# Patient Record
Sex: Female | Born: 1970
Health system: Southern US, Community
[De-identification: ages and names within clinical notes are randomized; demographics above are authoritative.]

## PROBLEM LIST (undated history)

## (undated) DIAGNOSIS — R55 Syncope and collapse: Secondary | ICD-10-CM

## (undated) DIAGNOSIS — F410 Panic disorder [episodic paroxysmal anxiety] without agoraphobia: Secondary | ICD-10-CM

## (undated) DIAGNOSIS — K219 Gastro-esophageal reflux disease without esophagitis: Secondary | ICD-10-CM

## (undated) DIAGNOSIS — F32A Depression, unspecified: Secondary | ICD-10-CM

## (undated) DIAGNOSIS — E739 Lactose intolerance, unspecified: Secondary | ICD-10-CM

## (undated) DIAGNOSIS — A048 Other specified bacterial intestinal infections: Secondary | ICD-10-CM

## (undated) DIAGNOSIS — I10 Essential (primary) hypertension: Secondary | ICD-10-CM

## (undated) DIAGNOSIS — M51369 Other intervertebral disc degeneration, lumbar region without mention of lumbar back pain or lower extremity pain: Secondary | ICD-10-CM

## (undated) DIAGNOSIS — E559 Vitamin D deficiency, unspecified: Secondary | ICD-10-CM

## (undated) DIAGNOSIS — R569 Unspecified convulsions: Secondary | ICD-10-CM

## (undated) DIAGNOSIS — Z8719 Personal history of other diseases of the digestive system: Secondary | ICD-10-CM

## (undated) DIAGNOSIS — M1990 Unspecified osteoarthritis, unspecified site: Secondary | ICD-10-CM

## (undated) DIAGNOSIS — M5136 Other intervertebral disc degeneration, lumbar region: Secondary | ICD-10-CM

## (undated) DIAGNOSIS — M2550 Pain in unspecified joint: Secondary | ICD-10-CM

## (undated) DIAGNOSIS — K760 Fatty (change of) liver, not elsewhere classified: Secondary | ICD-10-CM

## (undated) DIAGNOSIS — R079 Chest pain, unspecified: Secondary | ICD-10-CM

## (undated) DIAGNOSIS — R42 Dizziness and giddiness: Secondary | ICD-10-CM

## (undated) DIAGNOSIS — F4310 Post-traumatic stress disorder, unspecified: Secondary | ICD-10-CM

## (undated) DIAGNOSIS — M5430 Sciatica, unspecified side: Secondary | ICD-10-CM

## (undated) DIAGNOSIS — F419 Anxiety disorder, unspecified: Secondary | ICD-10-CM

## (undated) DIAGNOSIS — R202 Paresthesia of skin: Secondary | ICD-10-CM

## (undated) DIAGNOSIS — E538 Deficiency of other specified B group vitamins: Secondary | ICD-10-CM

## (undated) DIAGNOSIS — E669 Obesity, unspecified: Secondary | ICD-10-CM

## (undated) DIAGNOSIS — M5412 Radiculopathy, cervical region: Secondary | ICD-10-CM

## (undated) DIAGNOSIS — R002 Palpitations: Secondary | ICD-10-CM

## (undated) DIAGNOSIS — K5909 Other constipation: Secondary | ICD-10-CM

## (undated) DIAGNOSIS — E7800 Pure hypercholesterolemia, unspecified: Secondary | ICD-10-CM

## (undated) DIAGNOSIS — Z862 Personal history of diseases of the blood and blood-forming organs and certain disorders involving the immune mechanism: Secondary | ICD-10-CM

## (undated) DIAGNOSIS — G43909 Migraine, unspecified, not intractable, without status migrainosus: Secondary | ICD-10-CM

## (undated) DIAGNOSIS — D509 Iron deficiency anemia, unspecified: Secondary | ICD-10-CM

## (undated) DIAGNOSIS — F329 Major depressive disorder, single episode, unspecified: Secondary | ICD-10-CM

## (undated) DIAGNOSIS — R200 Anesthesia of skin: Secondary | ICD-10-CM

## (undated) DIAGNOSIS — G4700 Insomnia, unspecified: Secondary | ICD-10-CM

## (undated) HISTORY — PX: WISDOM TOOTH EXTRACTION: SHX21

## (undated) HISTORY — DX: Deficiency of other specified B group vitamins: E53.8

## (undated) HISTORY — DX: Fatty (change of) liver, not elsewhere classified: K76.0

## (undated) HISTORY — DX: Depression, unspecified: F32.A

## (undated) HISTORY — DX: Lactose intolerance, unspecified: E73.9

## (undated) HISTORY — DX: Chest pain, unspecified: R07.9

## (undated) HISTORY — DX: Anxiety disorder, unspecified: F41.9

## (undated) HISTORY — DX: Unspecified osteoarthritis, unspecified site: M19.90

## (undated) HISTORY — DX: Gastro-esophageal reflux disease without esophagitis: K21.9

## (undated) HISTORY — DX: Pain in unspecified joint: M25.50

## (undated) HISTORY — PX: TUBAL LIGATION: SHX77

## (undated) HISTORY — DX: Personal history of diseases of the blood and blood-forming organs and certain disorders involving the immune mechanism: Z86.2

## (undated) HISTORY — DX: Pure hypercholesterolemia, unspecified: E78.00

## (undated) HISTORY — DX: Major depressive disorder, single episode, unspecified: F32.9

## (undated) HISTORY — PX: EYE SURGERY: SHX253

## (undated) HISTORY — DX: Post-traumatic stress disorder, unspecified: F43.10

## (undated) HISTORY — PX: BUNIONECTOMY: SHX129

## (undated) HISTORY — DX: Other specified bacterial intestinal infections: A04.8

## (undated) HISTORY — DX: Vitamin D deficiency, unspecified: E55.9

---

## 1999-09-27 ENCOUNTER — Emergency Department (HOSPITAL_COMMUNITY): Admission: EM | Admit: 1999-09-27 | Discharge: 1999-09-27 | Payer: Self-pay | Admitting: Emergency Medicine

## 1999-09-28 ENCOUNTER — Encounter: Payer: Self-pay | Admitting: Emergency Medicine

## 2000-07-24 ENCOUNTER — Other Ambulatory Visit: Admission: RE | Admit: 2000-07-24 | Discharge: 2000-07-24 | Payer: Self-pay | Admitting: Obstetrics and Gynecology

## 2005-12-10 ENCOUNTER — Encounter: Admission: RE | Admit: 2005-12-10 | Discharge: 2005-12-10 | Payer: Self-pay | Admitting: Obstetrics & Gynecology

## 2006-01-21 ENCOUNTER — Ambulatory Visit: Payer: Self-pay | Admitting: Orthopedic Surgery

## 2006-02-05 ENCOUNTER — Encounter (HOSPITAL_COMMUNITY): Admission: RE | Admit: 2006-02-05 | Discharge: 2006-03-07 | Payer: Self-pay | Admitting: Orthopedic Surgery

## 2007-05-20 ENCOUNTER — Ambulatory Visit: Payer: Self-pay | Admitting: Orthopedic Surgery

## 2007-11-25 ENCOUNTER — Encounter: Payer: Self-pay | Admitting: Orthopedic Surgery

## 2007-11-25 ENCOUNTER — Emergency Department (HOSPITAL_COMMUNITY): Admission: EM | Admit: 2007-11-25 | Discharge: 2007-11-25 | Payer: Self-pay | Admitting: Emergency Medicine

## 2007-12-31 ENCOUNTER — Encounter (HOSPITAL_COMMUNITY): Admission: RE | Admit: 2007-12-31 | Discharge: 2008-01-01 | Payer: Self-pay | Admitting: Internal Medicine

## 2008-01-06 ENCOUNTER — Encounter (HOSPITAL_COMMUNITY): Admission: RE | Admit: 2008-01-06 | Discharge: 2008-02-05 | Payer: Self-pay | Admitting: Internal Medicine

## 2009-03-04 ENCOUNTER — Ambulatory Visit: Payer: Self-pay | Admitting: Physician Assistant

## 2009-03-04 DIAGNOSIS — F17200 Nicotine dependence, unspecified, uncomplicated: Secondary | ICD-10-CM | POA: Insufficient documentation

## 2009-03-04 DIAGNOSIS — K219 Gastro-esophageal reflux disease without esophagitis: Secondary | ICD-10-CM | POA: Insufficient documentation

## 2009-03-04 DIAGNOSIS — K5900 Constipation, unspecified: Secondary | ICD-10-CM | POA: Insufficient documentation

## 2009-03-04 DIAGNOSIS — E669 Obesity, unspecified: Secondary | ICD-10-CM | POA: Insufficient documentation

## 2009-03-04 DIAGNOSIS — K5909 Other constipation: Secondary | ICD-10-CM

## 2009-03-09 ENCOUNTER — Ambulatory Visit (HOSPITAL_COMMUNITY): Admission: RE | Admit: 2009-03-09 | Discharge: 2009-03-09 | Payer: Self-pay | Admitting: Gastroenterology

## 2009-03-09 HISTORY — PX: ESOPHAGOGASTRODUODENOSCOPY: SHX1529

## 2009-05-05 ENCOUNTER — Ambulatory Visit: Payer: Self-pay | Admitting: Family Medicine

## 2009-05-05 DIAGNOSIS — D509 Iron deficiency anemia, unspecified: Secondary | ICD-10-CM | POA: Insufficient documentation

## 2009-05-18 ENCOUNTER — Encounter: Payer: Self-pay | Admitting: Physician Assistant

## 2009-06-23 ENCOUNTER — Telehealth: Payer: Self-pay | Admitting: Physician Assistant

## 2009-07-20 ENCOUNTER — Encounter: Payer: Self-pay | Admitting: Physician Assistant

## 2009-10-03 ENCOUNTER — Ambulatory Visit: Payer: Self-pay | Admitting: Orthopedic Surgery

## 2009-10-03 ENCOUNTER — Encounter (INDEPENDENT_AMBULATORY_CARE_PROVIDER_SITE_OTHER): Payer: Self-pay | Admitting: *Deleted

## 2009-10-04 ENCOUNTER — Encounter (INDEPENDENT_AMBULATORY_CARE_PROVIDER_SITE_OTHER): Payer: Self-pay | Admitting: *Deleted

## 2009-10-18 ENCOUNTER — Encounter (INDEPENDENT_AMBULATORY_CARE_PROVIDER_SITE_OTHER): Payer: Self-pay | Admitting: *Deleted

## 2009-10-26 ENCOUNTER — Telehealth: Payer: Self-pay | Admitting: Orthopedic Surgery

## 2009-10-28 ENCOUNTER — Encounter: Payer: Self-pay | Admitting: Orthopedic Surgery

## 2010-01-21 ENCOUNTER — Encounter: Payer: Self-pay | Admitting: Orthopedic Surgery

## 2010-01-31 NOTE — Miscellaneous (Signed)
Summary: pt rescheduled her mri to 10/20/09,resc appt also  Clinical Lists Changes

## 2010-01-31 NOTE — Letter (Signed)
Summary: Out of Work  [facility]  [address]  [place], [place] [address]   Phone: [phone]  Fax: [phone]    October 03, 2009   Employee:  KAIRA STRINGFIELD    To Whom It May Concern:   For Medical reasons, please excuse the above named employee from work for the following dates:  Start:   10/03/09  End/Return to work:   10/03/09 following appointment in our office this morning.   If you need additional information, please feel free to contact our office.         Sincerely,   Terrance Mass, MD

## 2010-01-31 NOTE — Progress Notes (Signed)
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  Phentermine prescription can be discussed at the OV. Follow-up by: Esperanza Sheets PA,  June 23, 2009 3:05 PM  Additional Follow-up for Phone Call Additional follow up Details #1::        returned call, left message Additional Follow-up by: Adella Hare LPN,  June 24, 2009 2:18 PM    Additional Follow-up for Phone Call Additional follow up Details #2::    patient aware Follow-up by: Adella Hare LPN,  June 24, 2009 2:37 PM
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
## 2010-01-31 NOTE — Letter (Signed)
Summary: Letter  Letter   Imported By: Lind Guest 05/18/2009 14:24:47  _____________________________________________________________________  External Attachment:    Type:   Image     Comment:   External Document

## 2010-01-31 NOTE — Letter (Signed)
Summary: *Orthopedic No Show Letter  [facility]  [address]  [place], [place] [address]   Phone: [phone]  Fax: [phone]      10/28/2009   Ms. Deah Ottaway [address] [place], [place] [address]   Dear Ms. Speakman,   Our records indicate that you missed your scheduled appointment with Dr. Beaulah Corin on October 26, 2009, and the related MRI appointment scheduled at [facility] October 20, 2009.   Please contact this office to reschedule your appointments.  It is important that you keep your scheduled appointments with your physician, so we can provide you the best care possible.        Sincerely,    Dr. Terrance Mass, MD [facility] Phone [phone]

## 2010-01-31 NOTE — Medication Information (Signed)
Summary: RX Folder  RX Folder   Imported By: Cammie Sickle 11/04/2009 18:41:30  _____________________________________________________________________  External Attachment:    Type:   Image     Comment:   External Document

## 2010-01-31 NOTE — Miscellaneous (Signed)
Summary: mri open of the c spine for 10/13/09 reg 7:00pm come in for fu  Clinical Lists Changes  precert for [employer] [id number] expires 11/02/09, called pt lmom to call to schedule fu appt for results, her mri is at [facility] imaging [place] location, 10/04/09 left mess.  I left a fol/up voice msg on cell ph #[phone] 10/07/09 for patient to return call to confirm MRI appt & to schedule fol up appointment here.   pt called back 10/07/09 I advised to bring her films and report with her fo appt on 10/20/09

## 2010-01-31 NOTE — Progress Notes (Signed)
Summary: call to patient re:  appointments  ---- Converted from flag ---- ---- 10/25/2009 10:18 AM, Cammie Sickle wrote: I called patient's cell # [phone], left a voice mail msg to please return call today, re: office follow up appointment and MRI appointment, to re-schedule.  ---- 10/25/2009 9:07 AM, Forde Dandy L Kandis Ban wrote: i tried to call this pt, she is to come in for mri results on wed, i dont think she had mri, she has resceduled mri one time before, the line has been busy today at her residence ------------------------------

## 2010-01-31 NOTE — Letter (Signed)
Summary: Letter  Letter   Imported By: Lind Guest 07/20/2009 14:40:36  _____________________________________________________________________  External Attachment:    Type:   Image     Comment:   External Document

## 2010-01-31 NOTE — Assessment & Plan Note (Signed)
Summary: weight follow up- room 2   Vital Signs:  Patient profile:   40 year old female Height:      62.5 inches Weight:      208.50 pounds BMI:     37.66 O2 Sat:      100 % on Room air Pulse rate:   90 / minute Resp:     16 per minute BP sitting:   110 / 70  (left arm)  Vitals Entered By: Adella Hare LPN (May 06, 4399 4:09 PM) CC: weight follow up Is Patient Diabetic? No Pain Assessment Patient in pain? no        CC:  weight follow up.  History of Present Illness: Has been going to the [facility] x about 4-6 weeks.  Goes 2 days per week.  Before starting at the [facility] was walking 2 days a week. She has been taking Phentermine daily.  Has lost < 1 # since her visit 2 mos ago. States she has been eating healthier.  Has cut out sodas, & sweets.  Had recent EGD ( end of March ).  Significant GERD per pt.  Using Omeprazole 20mg  once daily (pt previously told us it was Prevacid, but states today it is actually Omeprazole).  Still having heart burn & needing to use over the counter prods with it.  BM's normal.  Pt states she had her blood drawn after her last visit.  There are no results in EMR and lab states nothing in their system.  Pt states she has received a bill from the lab for this though.  Pt also states that she has been told that she is anemia in the past.  She takes 1 iron pill daily. Also hx of chronic constipation.  She uses Miralax for this & works well.     Current Medications (verified): 1)  Prevacid 24hr 15 Mg Cpdr (Lansoprazole) .... One Cap By Mouth Once Daily 2)  Phentermine Hcl 37.5 Mg Caps (Phentermine Hcl) .Marland Kitchen.. 1 Q Am  Allergies (verified): No Known Drug Allergies  Past History:  Past medical history reviewed for relevance to current acute and chronic problems.  Past Medical History: Reviewed history from 03/04/2009 and no changes required. acid reflux constipation  Review of Systems General:  Denies chills and fever. CV:  Denies chest pain or  discomfort and palpitations. Resp:  Denies cough and shortness of breath. GI:  Complains of loss of appetite; denies change in bowel habits, constipation, diarrhea, nausea, and vomiting.  Physical Exam  General:  Well-developed,well-nourished,in no acute distress; alert,appropriate and cooperative throughout examination Head:  Normocephalic and atraumatic without obvious abnormalities. No apparent alopecia or balding. Ears:  External ear exam shows no significant lesions or deformities.  Otoscopic examination reveals clear canals, tympanic membranes are intact bilaterally without bulging, retraction, inflammation or discharge. Hearing is grossly normal bilaterally. Nose:  External nasal examination shows no deformity or inflammation. Nasal mucosa are pink and moist without lesions or exudates. Mouth:  Oral mucosa and oropharynx without lesions or exudates.   Neck:  No deformities, masses, or tenderness noted. Lungs:  Normal respiratory effort, chest expands symmetrically. Lungs are clear to auscultation, no crackles or wheezes. Heart:  Normal rate and regular rhythm. S1 and S2 normal without gallop, murmur, click, rub or other extra sounds. Abdomen:  soft, non-tender, normal bowel sounds, no masses, no hepatomegaly, and no splenomegaly.     Impression & Recommendations:  Problem # 1:  GERD (ICD-530.81) Assessment Unchanged  The following medications  were removed from the medication list:    Prevacid 24hr 15 Mg Cpdr (Lansoprazole) ..... One cap by mouth once daily Her updated medication list for this problem includes:    Omeprazole 40 Mg Cpdr (Omeprazole) .Marland Kitchen... Take 1 daily  Orders: T-Comprehensive Metabolic Panel ([id number])  Problem # 2:  CONSTIPATION, CHRONIC (ICD-564.09) Assessment: Improved  The following medications were removed from the medication list:    Miralax Powd (Polyethylene glycol 3350) .Marland KitchenMarland KitchenMarland KitchenMarland Kitchen 17 gm two times a day Her updated medication list for this problem  includes:    Miralax Powd (Polyethylene glycol 3350) ..... Once daily  Problem # 3:  ANEMIA, IRON DEFICIENCY (ICD-280.9) Assessment: Comment Only  Her updated medication list for this problem includes:    Ferrous Sulfate 325 (65 Fe) Mg Tabs (Ferrous sulfate) .Marland Kitchen... Take 1 daily  Orders: T-CBC No Diff ([id number]) T-Iron ([id number])  Problem # 4:  OBESITY (ICD-278.00) Assessment: Unchanged Discussed with pt that because she isn't losing wt taking Phentermine there is no reason to continue the prescription.  Did offer to give her 1 more mos to try it, but pt declined. Discussed healthy diet.  Discussed 1500-1600 calorie diet pain, and portion sizes.  Handouts given.   Recommended pt to increase her exercise to a minimum of3 days per week, with the goal to be 5 days per wk.  Discussed if she is unable to go to the [facility] this often to walk for exercise on the days she can't go.  Orders: T-Comprehensive Metabolic Panel [phone]) T-Lipid Profile [id number]) T-TSH [id number]) T- Hemoglobin A1C ([id number])  Complete Medication List: 1)  Omeprazole 40 Mg Cpdr (Omeprazole) .... Take 1 daily 2)  Miralax Powd (Polyethylene glycol 3350) .... Once daily 3)  Ferrous Sulfate 325 (65 Fe) Mg Tabs (Ferrous sulfate) .... Take 1 daily  Other Orders: T-Vitamin D (25-Hydroxy) ([id number])  Patient Instructions: 1)  Please schedule a follow-up appointment in 1 month. 2)  Increase your exercise to a minimum of 3 days per week, with the goal to be 5 days a week. 3)  I have given you diet information to follow. Prescriptions: OMEPRAZOLE 40 MG CPDR (OMEPRAZOLE) take 1 daily  #30 x 5   Entered and Authorized by:   Esperanza Sheets PA   Signed by:   Esperanza Sheets PA on 05/05/2009   Method used:   Electronically to        [facility] [phone]* (retail)       [address] [place]  [address]       Ph: [phone]       Fax: [phone]   RxID:   [id number]

## 2010-01-31 NOTE — Assessment & Plan Note (Signed)
Summary: NEW PATIENT - room 3   Vital Signs:  Patient profile:   40 year old female Height:      62.5 inches Weight:      209.25 pounds BMI:     37.80 O2 Sat:      98 % on Room air Pulse rate:   79 / minute Resp:     16 per minute BP sitting:   122 / 82  (left arm)  Vitals Entered By: Adella Hare LPN (March 05, 979 10:23 AM)  Nutrition Counseling: Patient's BMI is greater than 25 and therefore counseled on weight management options. CC: new patient Is Patient Diabetic? No Pain Assessment Patient in pain? no        CC:  new patient.  History of Present Illness: New pt.  Est care with new PCP.  Pt is concerned about her wt.  Wt gain approx 15 # in last 2-3 mos.  Pt states gradually started in last 3yr, but really noticed most of wt gain in last 2-3 mos.  Had used Phenteramine in past for Wt loss.  Lost 45# with it approx 5-6 yrs ago.  She walks for exercise, 1-2 d/wk.  Feels she has been eating fairly healthy.  Does "have a sweet tooth."  Has been more tired too.  Seeing gastroenterologist.  Has Gerd & constipation.  Having EGD next week. See's GYN - pap, etc utd per pt.  Also has been having some discomfort across her posterior neck & shoulder area off & on.  No injury.  Is not taking any meds. Has seen ortho in past for low back pain.  No parasthesia's UE.  No radiation down UE.     Current Medications (verified): 1)  Prevacid 24hr 15 Mg Cpdr (Lansoprazole) .... One Cap By Mouth Once Daily 2)  Miralax  Powd (Polyethylene Glycol 3350) .Marland KitchenMarland KitchenMarland Kitchen 17 Gm Two Times A Day  Allergies (verified): No Known Drug Allergies  Past History:  Past medical, surgical, family and social histories (including risk factors) reviewed, and no changes noted (except as noted below).  Past Medical History: acid reflux constipation  Past Surgical History: tubal ligation bunion removal  Family History: Reviewed history and no changes required. mother living- DM, HTM, osteoarthritis,  fibromyalgia father deceased- health status unknown 3 brothers living- CHF 1 sister living- DM, HTN  Social History: Reviewed history and no changes required. 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 ENT:  Denies nasal congestion, sinus pressure, and sore throat. CV:  Denies chest pain or discomfort and palpitations. Resp:  Denies cough and shortness of breath. GI:  Complains of constipation and indigestion. MS:  Denies joint pain, joint swelling, mid back pain, muscle weakness, and stiffness. Neuro:  Denies numbness and tingling. Heme:  Denies enlarge lymph nodes and fevers.  Physical Exam  General:  Well-developed,well-nourished,in no acute distress; alert,appropriate and cooperative throughout examination Head:  Normocephalic and atraumatic without obvious abnormalities. No apparent alopecia or balding. Ears:  External ear exam shows no significant lesions or deformities.  Otoscopic examination reveals clear canals, tympanic membranes are intact bilaterally without bulging, retraction, inflammation or discharge. Hearing is grossly normal bilaterally. Nose:  External nasal examination shows no deformity or inflammation. Nasal mucosa  are pink and moist without lesions or exudates. Mouth:  Oral mucosa and oropharynx without lesions or exudates.  Teeth in good repair. Neck:  No deformities, masses, or tenderness noted.no thyromegaly.   Lungs:  Normal respiratory effort, chest expands symmetrically. Lungs are clear to auscultation, no crackles or wheezes. Heart:  Normal rate and regular rhythm. S1 and S2 normal without gallop, murmur, click, rub or other extra sounds. Abdomen:  Bowel sounds  positive,abdomen soft and non-tender without masses, organomegaly or hernias noted. Msk:  FROM Cspine. Nontender to palp.  With palp of trapezius & upper thoracic muscles pt reports this is the area she gets intermittent pain.  Bilat shoulders neg. Extremities:  No clubbing, cyanosis, edema, or deformity noted with normal full range of motion of all joints.   Neurologic:  alert & oriented X3, strength normal in all extremities, sensation intact to light touch, gait normal, and DTRs symmetrical and normal.   Cervical Nodes:  No lymphadenopathy noted Psych:  Cognition and judgment appear intact. Alert and cooperative with normal attention span and concentration. No apparent delusions, illusions, hallucinations   Impression & Recommendations:  Problem # 1:  OBESITY (ICD-278.00) Assessment Deteriorated Discussed diet, serving sizes, d/c soda & sweet tea, increase water intake. Also needs to increase exercise to 3 d/wk or more.  Incorporate some wt training too.  Orders: T-Comprehensive Metabolic Panel [id number]) T-Lipid Profile [id number]) T-TSH [id number]) T-Hgb A1C ([id number])  Ht: 62.5 (03/04/2009)   Wt: 209.25 (03/04/2009)   BMI: 37.80 (03/04/2009)  Problem # 2:  MUSCLE SPASM, TRAPEZIUS MUSCLE, LEFT (ICD-728.85) Assessment: Improved Heat to area as needed. Rx Robaxin to use as needed.  Problem # 3:  NICOTINE ADDICTION (ICD-305.1) Assessment: Unchanged  Encouraged smoking cessation   Problem # 4:  GERD (ICD-530.81) Assessment: Unchanged Continue follow up with GI.   Her updated medication list for this problem includes:    Prevacid 24hr 15 Mg Cpdr (Lansoprazole) ..... One cap by mouth once daily  Problem # 5:  CONSTIPATION, CHRONIC (ICD-564.09) Assessment: Unchanged  Her updated medication list for this problem includes:    Miralax Powd (Polyethylene glycol 3350) .Marland KitchenMarland KitchenMarland KitchenMarland Kitchen 17 gm two times a day  Complete Medication List: 1)  Prevacid 24hr 15 Mg Cpdr (Lansoprazole)  .... One cap by mouth once daily 2)  Miralax Powd (Polyethylene glycol 3350) .Marland KitchenMarland Kitchen. 17 gm two times a day 3)  Robaxin 500 Mg Tabs (Methocarbamol) .... 2 tabs q 6 hrs as needed muscle spasm 4)  Phentermine Hcl 37.5 Mg Caps (Phentermine hcl) .Marland Kitchen.. 1 q am  Other Orders: T-CBC No Diff ([id number]) T-Assay of Vitamin D ([id number])  Patient Instructions: 1)  Please schedule a follow-up appointment in 2 months. 2)  Tobacco is very bad for your health and your loved ones! You Should stop smoking!. 3)  Stop Smoking Tips: Choose a Quit date. Cut down before the Quit date. decide what you will do as a substitute when you feel the urge to smoke(gum,toothpick,exercise). 4)  It is important that you exercise regularly at least 20 minutes 5 times a week. If you develop chest pain, have severe difficulty breathing, or feel very tired , stop exercising immediately and seek medical attention. 5)  You need to lose weight. Consider a lower calorie diet and regular exercise.  Prescriptions: PHENTERMINE HCL 37.5 MG CAPS (PHENTERMINE HCL) 1 q am  #30 x 1   Entered and Authorized by:   Esperanza Sheets PA   Signed by:   Esperanza Sheets PA on 03/04/2009  Method used:   Printed then faxed to ...       [facility] [address] [phone]* (retail)       [address] [place], [place]  [address]       Ph: [phone]       Fax: [phone]   RxID:   [id number] ROBAXIN 500 MG TABS (METHOCARBAMOL) 2 tabs q 6 hrs as needed muscle spasm  #60 x 0   Entered and Authorized by:   Esperanza Sheets PA   Signed by:   Esperanza Sheets PA on 03/04/2009   Method used:   Electronically to        [facility] [address] [phone]* (retail)       [address], [place]  [address]       Ph: [phone]       Fax: [phone]   RxID:   [id number]

## 2011-03-22 ENCOUNTER — Ambulatory Visit: Payer: Self-pay | Admitting: Family Medicine

## 2011-03-22 ENCOUNTER — Encounter: Payer: Self-pay | Admitting: Family Medicine

## 2011-04-23 ENCOUNTER — Emergency Department (HOSPITAL_COMMUNITY)
Admission: EM | Admit: 2011-04-23 | Discharge: 2011-04-23 | Disposition: A | Discharge status: Home / Self Care | Payer: BC Managed Care – PPO | Attending: Emergency Medicine | Admitting: Emergency Medicine

## 2011-04-23 ENCOUNTER — Encounter (HOSPITAL_COMMUNITY): Payer: Self-pay

## 2011-04-23 DIAGNOSIS — R42 Dizziness and giddiness: Secondary | ICD-10-CM

## 2011-04-23 DIAGNOSIS — R110 Nausea: Secondary | ICD-10-CM | POA: Insufficient documentation

## 2011-04-23 DIAGNOSIS — K219 Gastro-esophageal reflux disease without esophagitis: Secondary | ICD-10-CM | POA: Insufficient documentation

## 2011-04-23 HISTORY — DX: Gastro-esophageal reflux disease without esophagitis: K21.9

## 2011-04-23 MED ORDER — MECLIZINE HCL 25 MG PO TABS: 25.0000 mg | ORAL_TABLET | Freq: Three times a day (TID) | ORAL | Status: AC | PRN

## 2011-04-23 MED ORDER — MECLIZINE HCL 12.5 MG PO TABS
25.0000 mg | ORAL_TABLET | Freq: Once | ORAL | Status: AC
  Administered 2011-04-23: 25 mg via ORAL
  Filled 2011-04-23: qty 2

## 2011-04-23 MED ORDER — ONDANSETRON HCL 4 MG PO TABS
4.0000 mg | ORAL_TABLET | Freq: Once | ORAL | Status: AC
  Administered 2011-04-23: 4 mg via ORAL
  Filled 2011-04-23: qty 1

## 2011-04-23 MED ORDER — PROMETHAZINE HCL 25 MG PO TABS: 12.5000 mg | ORAL_TABLET | Freq: Four times a day (QID) | ORAL | Status: DC | PRN

## 2011-04-23 NOTE — ED Notes (Signed)
Pt woke up with dizziness and nausea this morning. Also, complain of pain in right leg and bruising for months

## 2011-04-23 NOTE — Discharge Instructions (Signed)
Make position changes slowly. Use the antivert as directed. Use the nausea medicine as needed.

## 2011-04-23 NOTE — ED Provider Notes (Signed)
History   This chart was scribed for [employer]. Colon Branch, MD by Brooks Sailors. The patient was seen in room APA06/APA06. Patient's care was started at 0710.   CSN: [id number]  Arrival date & time 04/23/11  0710   First MD Initiated Contact with Patient 04/23/11 [phone]      Chief Complaint  Patient presents with  . Dizziness    (Consider location/radiation/quality/duration/timing/severity/associated sxs/prior treatment) HPI  Brooke [name] is a 41 y.o. female who presents to the Emergency Department complaining of dizziness and nausea onset this morning when getting out of bed. Patient notes the symptoms are moderate to severe and persistent since. Patient adds that moving her head aggravates the dizziness and nausea. Patient with h/o vertigo a couple years ago. Denies previous cold symptoms. Patient also notes severe pain in her right heel that radiates up her leg with associated numbness and tingling. Pain is aggravated by ambulating. Patient denies h/o of blood clot.   PCP Dr. Camelia Eng   Past Medical History  Diagnosis Date  . Acid reflux     History reviewed. No pertinent past surgical history.  History reviewed. No pertinent family history.  History  Substance Use Topics  . Smoking status: Current Everyday Smoker  . Smokeless tobacco: Not on file  . Alcohol Use: Yes    OB History    Grav Para Term Preterm Abortions TAB SAB Ect Mult Living                  Review of Systems  All other systems reviewed and are negative.    Allergies  Review of patient's allergies indicates no known allergies.  Home Medications  No current outpatient prescriptions on file.  BP 123/75  Pulse 67  Temp(Src) 97.6 F (36.4 C) (Oral)  Resp 20  Ht 5\' 2"  (1.575 m)  Wt 210 lb (95.255 kg)  BMI 38.41 kg/m2  SpO2 100%  LMP 04/09/2011  Physical Exam  Nursing note and vitals reviewed. Constitutional: She is oriented to person, place, and time. She appears well-developed and  well-nourished.  HENT:  Head: Normocephalic and atraumatic.  Mouth/Throat: Oropharynx is clear and moist.  Eyes: Conjunctivae and EOM are normal. Pupils are equal, round, and reactive to light.  Neck: Normal range of motion. Neck supple.  Cardiovascular: Normal rate and regular rhythm.  Exam reveals no gallop and no friction rub.   No murmur heard. Pulmonary/Chest: Effort normal and breath sounds normal.  Abdominal: Soft. Bowel sounds are normal.  Musculoskeletal: Tenderness: Right heel.        No achilles tenderness. Focal pain right heel.   Neurological: She is alert and oriented to person, place, and time.  Skin: Skin is warm and dry.  Psychiatric: She has a normal mood and affect.    ED Course  Procedures (including critical care time) DIAGNOSTIC STUDIES: Oxygen Saturation is 100% on room air, normal by my interpretation.    COORDINATION OF CARE: 7:55AM - Patient informed of exercises to help heel pain. And will prescribe anti-inflammatory and anti-nausea medication.  9:55AM Patient ambulated, says nausea and dizziness was much better. Advised to come back if fever or any other concerning symptoms start.      MDM  Patient woke up  with mild vertigo this morning associated with nausea. She has a history of vertigo at least one time in the past. No recent upper respiratory infections. Given Antivert and Zofran with relief of the nausea and partial relief of vertigo. She was able  to ambulate without problems.  Pt feels improved after observation and/or treatment in ED.Pt stable in ED with no significant deterioration in condition.The patient appears reasonably screened and/or stabilized for discharge and I doubt any other medical condition or other [facility] requiring further screening, evaluation, or treatment in the ED at this time prior to discharge.  I personally performed the services described in this documentation, which was scribed in my presence. The recorded information has been  reviewed and considered.   MDM Reviewed: nursing note and vitals            Nicoletta Dress. Colon Branch, MD 04/23/11 1000

## 2012-01-03 ENCOUNTER — Ambulatory Visit (INDEPENDENT_AMBULATORY_CARE_PROVIDER_SITE_OTHER): Payer: BC Managed Care – PPO | Admitting: Family Medicine

## 2012-01-03 ENCOUNTER — Encounter: Payer: Self-pay | Admitting: Family Medicine

## 2012-01-03 VITALS — BP 126/74 | HR 73 | Temp 97.7°F | Resp 18 | Ht 62.0 in | Wt 204.0 lb

## 2012-01-03 DIAGNOSIS — D509 Iron deficiency anemia, unspecified: Secondary | ICD-10-CM

## 2012-01-03 DIAGNOSIS — J329 Chronic sinusitis, unspecified: Secondary | ICD-10-CM | POA: Insufficient documentation

## 2012-01-03 DIAGNOSIS — E669 Obesity, unspecified: Secondary | ICD-10-CM

## 2012-01-03 DIAGNOSIS — D649 Anemia, unspecified: Secondary | ICD-10-CM

## 2012-01-03 DIAGNOSIS — K219 Gastro-esophageal reflux disease without esophagitis: Secondary | ICD-10-CM

## 2012-01-03 DIAGNOSIS — F17200 Nicotine dependence, unspecified, uncomplicated: Secondary | ICD-10-CM

## 2012-01-03 DIAGNOSIS — J069 Acute upper respiratory infection, unspecified: Secondary | ICD-10-CM

## 2012-01-03 MED ORDER — OMEPRAZOLE 40 MG PO CPDR: 40.0000 mg | DELAYED_RELEASE_CAPSULE | Freq: Every day | ORAL | Status: DC

## 2012-01-03 MED ORDER — POLYETHYLENE GLYCOL 3350 17 GM/SCOOP PO POWD: 17.0000 g | Freq: Every day | ORAL | Status: DC

## 2012-01-03 MED ORDER — CHLORPHENIRAMINE-HYDROCODONE 8-10 MG/5ML PO LQCR: 5.0000 mL | Freq: Two times a day (BID) | ORAL | Status: DC | PRN

## 2012-01-03 MED ORDER — NICOTINE 14 MG/24HR TD PT24: 1.0000 | MEDICATED_PATCH | TRANSDERMAL | Status: DC

## 2012-01-03 MED ORDER — AMOXICILLIN-POT CLAVULANATE 875-125 MG PO TABS: 1.0000 | ORAL_TABLET | Freq: Two times a day (BID) | ORAL | Status: AC

## 2012-01-03 NOTE — Progress Notes (Signed)
  Subjective:    Patient ID: Brooke [name], female    DOB: [birth year], 42 y.o.   MRN: [id number]  HPI Patient here to establish care. She was last seen in the office in 2011 however she left to establish care with Dr. Roseanne Reno he she was last seen by him 6 months ago. She presents today with URI and sinus symptoms. For the past week and a half she's had sinus congestion headache and pressure in her tears. She does have history of recurrent sinusitis and typically gets this every winter. She has had a flu shot. She is a smoker.  Medications and history reviewed  History of chronic constipation she is on MiraLAX for this. She also has acid reflux and is on omeprazole and does well with this. She is overdue for mammogram but had her Pap smear done by Dr. Tawanna Cooler Meisenger in [facility]  Works at [employer] in [place]  Review of Systems   GEN- + fatigue, fever, weight loss,weakness, recent illness HEENT- denies eye drainage, change in vision, +nasal discharge, CVS- denies chest pain, palpitations RESP- denies SOB, +cough, wheeze ABD- denies N/V, change in stools, abd pain GU- denies dysuria, hematuria, dribbling, incontinence MSK- denies joint pain, muscle aches, injury Neuro- denies headache, dizziness, syncope, seizure activity      Objective:   Physical Exam  GEN- NAD, alert and oriented x3 HEENT- PERRL, EOMI, non injected sclera, pink conjunctiva, MMM, oropharynx mild injection, TM clear bilat no effusion, erythema of Left TM, + maxillary sinus tenderness, inflammed turbinates, + Nasal drainage  Neck- Supple, no LAD CVS- RRR, no murmur RESP-CTAB ABD-NABS,soft,NT,ND EXT- No edema Pulses- Radial 2+ Psych-normal affect and mood         Assessment & Plan:

## 2012-01-03 NOTE — Assessment & Plan Note (Addendum)
No current activity, does not follow particular healthy diet, she slacked off her routine after her husband had a stroke. Check FLP

## 2012-01-03 NOTE — Assessment & Plan Note (Signed)
Antibiotics given, will also cover probable early otitis

## 2012-01-03 NOTE — Assessment & Plan Note (Signed)
Continue omeprazole 

## 2012-01-03 NOTE — Patient Instructions (Signed)
Antibiotics and cough medications Get the labs done fasting we will call with instructions  Work note  Work on the smoking!  Nicotine patch sent  F/U 6 months

## 2012-01-03 NOTE — Assessment & Plan Note (Signed)
Tobacco cessation done she will try NicoDerm patches she smokes about 8-10 cigarettes which are lights a day

## 2012-01-03 NOTE — Assessment & Plan Note (Signed)
Check CBC and total iron, for now iron tablet daily to be continued

## 2012-01-03 NOTE — Assessment & Plan Note (Signed)
Cough medicine given, fluids

## 2012-01-15 ENCOUNTER — Encounter: Payer: Self-pay | Admitting: Family Medicine

## 2012-02-05 ENCOUNTER — Encounter: Payer: Self-pay | Admitting: Family Medicine

## 2012-02-05 ENCOUNTER — Ambulatory Visit (INDEPENDENT_AMBULATORY_CARE_PROVIDER_SITE_OTHER): Payer: BC Managed Care – PPO | Admitting: Family Medicine

## 2012-02-05 VITALS — BP 122/80 | HR 98 | Resp 16 | Ht 62.0 in | Wt 207.8 lb

## 2012-02-05 DIAGNOSIS — F3289 Other specified depressive episodes: Secondary | ICD-10-CM

## 2012-02-05 DIAGNOSIS — F32A Depression, unspecified: Secondary | ICD-10-CM

## 2012-02-05 DIAGNOSIS — F329 Major depressive disorder, single episode, unspecified: Secondary | ICD-10-CM

## 2012-02-05 DIAGNOSIS — N912 Amenorrhea, unspecified: Secondary | ICD-10-CM

## 2012-02-05 DIAGNOSIS — F410 Panic disorder [episodic paroxysmal anxiety] without agoraphobia: Secondary | ICD-10-CM

## 2012-02-05 MED ORDER — VENLAFAXINE HCL ER 37.5 MG PO CP24: 37.5000 mg | ORAL_CAPSULE | Freq: Every day | ORAL | Status: DC

## 2012-02-05 MED ORDER — ALPRAZOLAM 0.5 MG PO TABS: 0.5000 mg | ORAL_TABLET | Freq: Three times a day (TID) | ORAL | Status: DC | PRN

## 2012-02-05 NOTE — Patient Instructions (Signed)
Start effexor once a day  Use xanax 1 hour before bedtime for rest Use during the day for panic attacks Call if you have any concerns F/U 4 weeks

## 2012-02-05 NOTE — Progress Notes (Signed)
  Subjective:    Patient ID: Brooke [name], female    DOB: 10-14-1970, 42 y.o.   MRN: [id number]  HPI  Patient presents with stress depression symptoms, panic attacks. For the past 2 months she's had increasing stress. Her husband at age 27 he had a stroke in August of 2013 he has been out of work full-time and she's been the only one providing most of the fianaces at the home she also has 2 sons one of whom has a young daughter who they've been caring for her and has a lot of trauma between the mother the baby in the family. 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 She's very little interest in sex and does not sleep very well. For the past few months she has not had a menstrual cycle but denies any abdominal pain nausea vomiting. She does not sleep well, often has anger toward husband, Has had panic attacks at random times, when she feels overwhelmed, feels like she cant breathe, her heart races.  Her husbands family is also causing some stress because they run a family business and he has not been able to work  Review of Systems  GEN- denies fatigue, fever, weight loss,weakness, recent illness HEENT- denies eye drainage, change in vision, nasal discharge, CVS- denies chest pain, palpitations RESP- denies SOB, cough, wheeze ABD- denies N/V, change in stools, abd pain GU- denies dysuria, hematuria, dribbling, incontinence MSK- denies joint pain, muscle aches, injury Neuro- denies headache, dizziness, syncope, seizure activity      Objective:   Physical Exam   GEN-NAD,alert and oriented x 3 Psych- stressed appearing, normal affect and mood, no SI, no crying      Assessment & Plan:

## 2012-02-06 DIAGNOSIS — N912 Amenorrhea, unspecified: Secondary | ICD-10-CM

## 2012-02-06 DIAGNOSIS — F329 Major depressive disorder, single episode, unspecified: Secondary | ICD-10-CM | POA: Insufficient documentation

## 2012-02-06 DIAGNOSIS — F410 Panic disorder [episodic paroxysmal anxiety] without agoraphobia: Secondary | ICD-10-CM | POA: Insufficient documentation

## 2012-02-06 DIAGNOSIS — F321 Major depressive disorder, single episode, moderate: Secondary | ICD-10-CM | POA: Insufficient documentation

## 2012-02-06 HISTORY — DX: Amenorrhea, unspecified: N91.2

## 2012-02-06 NOTE — Assessment & Plan Note (Addendum)
Possible peri-menopausal or due to the current stress/depression she is facing U preg negative, will monitor, she has had some changes in her cycle and some night sweats

## 2012-02-06 NOTE — Assessment & Plan Note (Signed)
Start xanax as a Furniture conservator/restorer,

## 2012-02-06 NOTE — Assessment & Plan Note (Addendum)
She has been depressed and stressed for the past 3 months or more. Start effexor, if she is peri-menopausal this will help with  Those symptoms as well. Xanax at bedtime for sleep.  F/u 4 weeks

## 2012-03-04 ENCOUNTER — Ambulatory Visit: Payer: BC Managed Care – PPO | Admitting: Family Medicine

## 2012-03-07 ENCOUNTER — Ambulatory Visit: Payer: BC Managed Care – PPO | Admitting: Family Medicine

## 2012-04-01 ENCOUNTER — Encounter: Payer: Self-pay | Admitting: Family Medicine

## 2012-04-01 ENCOUNTER — Ambulatory Visit (INDEPENDENT_AMBULATORY_CARE_PROVIDER_SITE_OTHER): Payer: BC Managed Care – PPO | Admitting: Family Medicine

## 2012-04-01 VITALS — BP 104/78 | HR 73 | Temp 98.7°F | Resp 16 | Wt 208.0 lb

## 2012-04-01 DIAGNOSIS — R51 Headache: Secondary | ICD-10-CM

## 2012-04-01 DIAGNOSIS — B9789 Other viral agents as the cause of diseases classified elsewhere: Secondary | ICD-10-CM

## 2012-04-01 DIAGNOSIS — B349 Viral infection, unspecified: Secondary | ICD-10-CM

## 2012-04-01 DIAGNOSIS — R519 Headache, unspecified: Secondary | ICD-10-CM | POA: Insufficient documentation

## 2012-04-01 DIAGNOSIS — R110 Nausea: Secondary | ICD-10-CM

## 2012-04-01 HISTORY — DX: Headache: R51

## 2012-04-01 MED ORDER — IBUPROFEN 600 MG PO TABS: 600.0000 mg | ORAL_TABLET | Freq: Four times a day (QID) | ORAL | Status: DC | PRN

## 2012-04-01 MED ORDER — PROMETHAZINE HCL 25 MG/ML IJ SOLN
25.0000 mg | Freq: Once | INTRAMUSCULAR | Status: AC
  Administered 2012-04-01: 25 mg via INTRAMUSCULAR

## 2012-04-01 MED ORDER — KETOROLAC TROMETHAMINE 60 MG/2ML IM SOLN
60.0000 mg | Freq: Once | INTRAMUSCULAR | Status: AC
  Administered 2012-04-01: 60 mg via INTRAMUSCULAR

## 2012-04-01 NOTE — Patient Instructions (Signed)
Drink plenty of fluids You can take Ibuprofen 600mg  every 6 hours with food Take the phenergan F/U 6 weeks for mood

## 2012-04-01 NOTE — Assessment & Plan Note (Signed)
Today consistent with viral illness vs possible migraine. Headache bilateral Given phenergan and toradol in office She has topamax but was given this for weight loss, does not use currently, if headaches become a problem will start daily Ibuprofen given

## 2012-04-01 NOTE — Progress Notes (Signed)
  Subjective:    Patient ID: Brooke [name], female    DOB: Nov 30, 1970, 42 y.o.   MRN: [id number]  HPI  Patient presents with 24 hours of headache fatigue and nausea. She stated started yesterday while at work she felt dizzy headed. She left work early. She's not had any emesis or diarrhea or fever. She's had mild abdominal pain but none currently. She's not taking anything over-the-counter for the headache which is her main complaint. Positive for photosensitivity She also admits to mild cough and sinus drainage   Review of Systems - per above   GEN- + fatigue, fever, weight loss,weakness, recent illness HEENT- denies eye drainage, change in vision, nasal discharge, CVS- denies chest pain, palpitations RESP- denies SOB,+ cough, wheeze ABD- denies N/V, change in stools, abd pain GU- denies dysuria, hematuria, dribbling, incontinence MSK- denies joint pain, muscle aches, injury Neuro- +headache, dizziness, syncope, seizure activity      Objective:   Physical Exam GEN- NAD, alert and oriented x3, non toxic appearing HEENT- PERRL, EOMI, non injected sclera, pink conjunctiva, MMM, oropharynx mild injection, TM clear bilat no effusion, Fundoscopic exam benign, no sinus tenderness, nares clear Neck- Supple, no LAD CVS- RRR, no murmur RESP-CTAB ABD-NABS,soft,NT,ND EXT- No edema Pulses- Radial 2+ Neuro- CNII-XII in tact, no focal deficits        Assessment & Plan:

## 2012-04-01 NOTE — Assessment & Plan Note (Signed)
Very acute in nature she's not to clear her cell phone with the other. Her symptoms with mild cough sinus pressure and headache likely viral. Supportive care. She has some Phenergan at home she was treated for headache in office

## 2012-04-01 NOTE — Addendum Note (Signed)
Addended by: Abner Greenspan on: 04/01/2012 11:09 AM   Modules accepted: Orders

## 2012-04-29 ENCOUNTER — Encounter: Payer: Self-pay | Admitting: *Deleted

## 2012-05-13 ENCOUNTER — Encounter: Payer: Self-pay | Admitting: Family Medicine

## 2012-05-13 ENCOUNTER — Ambulatory Visit (INDEPENDENT_AMBULATORY_CARE_PROVIDER_SITE_OTHER): Payer: BC Managed Care – PPO | Admitting: Family Medicine

## 2012-05-13 VITALS — BP 110/80 | HR 77 | Resp 16 | Wt 208.4 lb

## 2012-05-13 DIAGNOSIS — F3289 Other specified depressive episodes: Secondary | ICD-10-CM

## 2012-05-13 DIAGNOSIS — Z130 Encounter for screening for diseases of the blood and blood-forming organs and certain disorders involving the immune mechanism: Secondary | ICD-10-CM

## 2012-05-13 DIAGNOSIS — Z1321 Encounter for screening for nutritional disorder: Secondary | ICD-10-CM

## 2012-05-13 DIAGNOSIS — R5381 Other malaise: Secondary | ICD-10-CM

## 2012-05-13 DIAGNOSIS — E669 Obesity, unspecified: Secondary | ICD-10-CM

## 2012-05-13 DIAGNOSIS — F32A Depression, unspecified: Secondary | ICD-10-CM

## 2012-05-13 DIAGNOSIS — F410 Panic disorder [episodic paroxysmal anxiety] without agoraphobia: Secondary | ICD-10-CM

## 2012-05-13 DIAGNOSIS — F329 Major depressive disorder, single episode, unspecified: Secondary | ICD-10-CM

## 2012-05-13 DIAGNOSIS — D649 Anemia, unspecified: Secondary | ICD-10-CM

## 2012-05-13 MED ORDER — VENLAFAXINE HCL ER 75 MG PO CP24: 75.0000 mg | ORAL_CAPSULE | Freq: Every day | ORAL | Status: DC

## 2012-05-13 NOTE — Patient Instructions (Signed)
Get the labs done fastings Restart the effexor 37.5mg  once a day for 2 weeks Then increase to 75mg  a day  Try to walk 30 minutes 3 times a week  Multivitamin with B12  Compression socks  F/U  6 weeks [employer]

## 2012-05-14 LAB — COMPREHENSIVE METABOLIC PANEL
ALT: 12 U/L (ref 0–35)
AST: 14 U/L (ref 0–37)
Albumin: 4 g/dL (ref 3.5–5.2)
Calcium: 8.9 mg/dL (ref 8.4–10.5)
Chloride: 109 mEq/L (ref 96–112)
Potassium: 4.1 mEq/L (ref 3.5–5.3)

## 2012-05-14 LAB — CBC
HCT: 39.1 % (ref 36.0–46.0)
MCH: 30.1 pg (ref 26.0–34.0)
MCHC: 33.2 g/dL (ref 30.0–36.0)
RDW: 14 % (ref 11.5–15.5)

## 2012-05-14 LAB — LIPID PANEL
LDL Cholesterol: 124 mg/dL — ABNORMAL HIGH (ref 0–99)
VLDL: 13 mg/dL (ref 0–40)

## 2012-05-14 LAB — IRON AND TIBC: UIBC: 181 ug/dL (ref 125–400)

## 2012-05-17 LAB — VITAMIN D 1,25 DIHYDROXY
Vitamin D 1, 25 (OH)2 Total: 73 pg/mL — ABNORMAL HIGH (ref 18–72)
Vitamin D2 1, 25 (OH)2: <8 pg/mL

## 2012-05-19 DIAGNOSIS — E669 Obesity, unspecified: Secondary | ICD-10-CM | POA: Insufficient documentation

## 2012-05-19 DIAGNOSIS — R5381 Other malaise: Secondary | ICD-10-CM | POA: Insufficient documentation

## 2012-05-19 HISTORY — DX: Other malaise: R53.81

## 2012-05-19 NOTE — Assessment & Plan Note (Signed)
Discussed need to start exercise program, weight loss needed, exercise will also help with stress and fatigue Labs unremarkable

## 2012-05-19 NOTE — Progress Notes (Signed)
  Subjective:    Patient ID: Brooke [name], female    DOB: 1970-06-05, 42 y.o.   MRN: [id number]  HPI  Pt here to f/u mood, she was doing well on low dose effexor then stopped taking, she was not crying as often and her mood had improved, doesn't know why she stopped taking. Has been more fatigued past few weeks, continues to have stress at home as she is the sole provider right now. Noticed swelling in her hands and feet, very active at work, swelling goes down when she is resting.   Review of Systems   GEN- + fatigue, fever, weight loss,weakness, recent illness HEENT- denies eye drainage, change in vision, nasal discharge, CVS- denies chest pain, palpitations RESP- denies SOB, cough, wheeze MSK- denies joint pain, muscle aches, injury Neuro- denies headache, dizziness, syncope, seizure activity      Objective:   Physical Exam GEN- NAD, alert and oriented x3 HEENT- PERRL, EOMI, non injected sclera, pink conjunctiva, MMM, oropharynx clear Neck- Supple, no JVD CVS- RRR, no murmur RESP-CTAB EXT- No edema Pulses- Radial, DP- 2+ Psych- stressed appearing, not depressed or anxious appearing, good eye contact        Assessment & Plan:

## 2012-05-19 NOTE — Assessment & Plan Note (Signed)
Labs unremarkable, multifactorial, stress,weight and mood

## 2012-05-19 NOTE — Assessment & Plan Note (Signed)
These had improved with Effexor, continues use of xanax

## 2012-05-19 NOTE — Assessment & Plan Note (Signed)
Restart effexor and titrate up

## 2012-05-28 ENCOUNTER — Telehealth: Payer: Self-pay | Admitting: Family Medicine

## 2012-05-29 NOTE — Telephone Encounter (Signed)
We can discuss at our next visit, I would not recommend this for her right now

## 2012-05-29 NOTE — Telephone Encounter (Signed)
Message left for patient

## 2012-07-02 ENCOUNTER — Ambulatory Visit: Payer: Self-pay | Admitting: Family Medicine

## 2012-07-03 ENCOUNTER — Ambulatory Visit: Payer: BC Managed Care – PPO | Admitting: Family Medicine

## 2012-08-07 ENCOUNTER — Other Ambulatory Visit: Payer: Self-pay

## 2012-08-07 DIAGNOSIS — Z1231 Encounter for screening mammogram for malignant neoplasm of breast: Secondary | ICD-10-CM

## 2012-08-20 ENCOUNTER — Ambulatory Visit: Payer: BC Managed Care – PPO

## 2012-09-09 ENCOUNTER — Ambulatory Visit: Payer: BC Managed Care – PPO

## 2012-11-19 ENCOUNTER — Encounter (HOSPITAL_COMMUNITY): Payer: Self-pay | Admitting: Pharmacy Technician

## 2012-11-19 ENCOUNTER — Encounter: Payer: Self-pay | Admitting: Gastroenterology

## 2012-11-19 ENCOUNTER — Ambulatory Visit (INDEPENDENT_AMBULATORY_CARE_PROVIDER_SITE_OTHER): Payer: BC Managed Care – PPO | Admitting: Gastroenterology

## 2012-11-19 VITALS — BP 134/97 | HR 92 | Temp 98.9°F | Ht 62.0 in | Wt 224.2 lb

## 2012-11-19 DIAGNOSIS — R1310 Dysphagia, unspecified: Secondary | ICD-10-CM

## 2012-11-19 DIAGNOSIS — R1012 Left upper quadrant pain: Secondary | ICD-10-CM

## 2012-11-19 DIAGNOSIS — R1314 Dysphagia, pharyngoesophageal phase: Secondary | ICD-10-CM

## 2012-11-19 DIAGNOSIS — D509 Iron deficiency anemia, unspecified: Secondary | ICD-10-CM

## 2012-11-19 DIAGNOSIS — R1319 Other dysphagia: Secondary | ICD-10-CM | POA: Insufficient documentation

## 2012-11-19 DIAGNOSIS — K5909 Other constipation: Secondary | ICD-10-CM

## 2012-11-19 DIAGNOSIS — K219 Gastro-esophageal reflux disease without esophagitis: Secondary | ICD-10-CM

## 2012-11-19 HISTORY — DX: Other dysphagia: R13.19

## 2012-11-19 HISTORY — DX: Left upper quadrant pain: R10.12

## 2012-11-19 MED ORDER — PEG 3350-KCL-NA BICARB-NACL 420 G PO SOLR: 4000.0000 mL | ORAL | Status: DC

## 2012-11-19 MED ORDER — PANTOPRAZOLE SODIUM 40 MG PO TBEC: 40.0000 mg | DELAYED_RELEASE_TABLET | Freq: Every day | ORAL | Status: DC

## 2012-11-19 MED ORDER — LINACLOTIDE 290 MCG PO CAPS: 290.0000 ug | ORAL_CAPSULE | Freq: Every day | ORAL | Status: DC

## 2012-11-19 NOTE — Progress Notes (Signed)
Primary Care Physician:  Toma Deiters, MD  Primary Gastroenterologist:    Chief Complaint  Patient presents with  . Constipation    Has BM once in 2 weeks    HPI:  Brooke [name] is a 42 y.o. female here for further evaluation of bowel change, GERD, LUQ pain. Has been on omeprazole for several years. Reports normal Bravo pH study in 2011 on omeprazole. Cannot live without it but doesn't control symptoms adequately. Protonix helped but insurance would not cover. Dexilant caused nausea. Nexium didn't seem to help. Aciphex helped. Heartburn every day. Difficulty swallowing breads, meats, pills. BMs very irregular. One to two every two weeks. Feels like incomplete evacuations. Stools soft. Takes miralax every day. No melena, brbpr. Some abdominal pain/cramps. Lots of bloating. Two weeks ago, dropped to one iron per day due to constipation. Felt weak so went back to two per day. Heavy menses, four day, bleeds for five. Passes blood clots. Follows with gyn, Dr. Jackelyn Knife. Worried about significant change in bowel function, more difficult to manage constipation.   Current Outpatient Prescriptions  Medication Sig Dispense Refill  . ALPRAZolam (XANAX) 0.5 MG tablet Take 1 tablet (0.5 mg total) by mouth 3 (three) times daily as needed for sleep or anxiety.  45 tablet  1  . alum & mag hydroxide-simeth (MAALOX/MYLANTA) 200-200-20 MG/5ML suspension Take by mouth every 6 (six) hours as needed for indigestion or heartburn.      Marland Kitchen BIOTIN PO Take by mouth.      . cholecalciferol (VITAMIN D) 1000 UNITS tablet Take 1,000 Units by mouth daily.      . Cyanocobalamin (B-12 PO) Take by mouth.      . ferrous sulfate 325 (65 FE) MG EC tablet Take 325 mg by mouth daily with breakfast.      . ibuprofen (ADVIL,MOTRIN) 600 MG tablet Take 1 tablet (600 mg total) by mouth every 6 (six) hours as needed for pain.  30 tablet  0  . omeprazole (PRILOSEC) 40 MG capsule Take 1 capsule (40 mg total) by mouth daily.  30 capsule  3   . polyethylene glycol powder (MIRALAX) powder Take 17 g by mouth daily.  527 g  3  . topiramate (TOPAMAX) 100 MG tablet Take 100 mg by mouth daily.      Marland Kitchen venlafaxine XR (EFFEXOR-XR) 75 MG 24 hr capsule Take 75 mg by mouth 2 (two) times daily.       No current facility-administered medications for this visit.    Allergies as of 11/19/2012  . (No Known Allergies)    Past Medical History  Diagnosis Date  . Acid reflux   . Anemia   . Depression   . Anxiety     Past Surgical History  Procedure Laterality Date  . Tubal ligation    . Esophagogastroduodenoscopy  03/09/09    Dr. Charlott Rakes, normal EGD, s/p Bravo capsule placement    Family History  Problem Relation Age of Onset  . Diabetes Mother   . Hypertension Mother   . Hypertension Father   . Diabetes Sister   . Hypertension Sister   . Hypertension Brother   . Cancer Maternal Grandmother     breast  . Diabetes Paternal Grandmother   . Hypertension Brother   . Hypertension Brother   . Colon cancer Neg Hx     History   Social History  . Marital Status: Married    Spouse Name: N/A    Number of Children: 2  .  Years of Education: N/A   Occupational History  . [employer]    Social History Main Topics  . Smoking status: Current Every Day Smoker -- 0.30 packs/day    Types: Cigarettes  . Smokeless tobacco: Not on file     Comment: less than 1/2 pack cigarettes daily  . Alcohol Use: Yes     Comment: rare  . Drug Use: No  . Sexual Activity: Not on file   Other Topics Concern  . Not on file   Social History Narrative  . No narrative on file      ROS:  General: Negative for anorexia, weight loss, fever, chills, fatigue, weakness. Eyes: Negative for vision changes.  ENT: Negative for hoarseness, difficulty swallowing , nasal congestion. CV: Negative for chest pain, angina, palpitations, dyspnea on exertion, peripheral edema.  Respiratory: Negative for dyspnea at rest, dyspnea on exertion,  cough, sputum, wheezing.  GI: See history of present illness. GU:  Negative for dysuria, hematuria, urinary incontinence, urinary frequency, nocturnal urination.  MS: Negative for joint pain, low back pain.  Derm: Negative for rash or itching.  Neuro: Negative for weakness, abnormal sensation, seizure, frequent headaches, memory loss, confusion.  Psych: Negative for anxiety, depression, suicidal ideation, hallucinations.  Endo: Negative for unusual weight change.  Heme: Negative for bruising or bleeding. Allergy: Negative for rash or hives.    Physical Examination:  BP 134/97  Pulse 92  Temp(Src) 98.9 F (37.2 C) (Oral)  Ht 5\' 2"  (1.575 m)  Wt 224 lb 3.2 oz (101.696 kg)  BMI 41.00 kg/m2  LMP 11/07/2012   General: Well-nourished, well-developed in no acute distress.  Head: Normocephalic, atraumatic.   Eyes: Conjunctiva pink, no icterus. Mouth: Oropharyngeal mucosa moist and pink , no lesions erythema or exudate. Neck: Supple without thyromegaly, masses, or lymphadenopathy.  Lungs: Clear to auscultation bilaterally.  Heart: Regular rate and rhythm, no murmurs rubs or gallops.  Abdomen: Bowel sounds are normal, mild LUQ tenderness, nondistended, no hepatosplenomegaly or masses, no abdominal bruits or    hernia , no rebound or guarding.   Rectal: not performed Extremities: No lower extremity edema. No clubbing or deformities.  Neuro: Alert and oriented x 4 , grossly normal neurologically.  Skin: Warm and dry, no rash or jaundice.   Psych: Alert and cooperative, normal mood and affect.  Labs: Lab Results  Component Value Date   CREATININE 0.83 05/13/2012   BUN 7 05/13/2012   NA 139 05/13/2012   K 4.1 05/13/2012   CL 109 05/13/2012   CO2 23 05/13/2012   Lab Results  Component Value Date   ALT 12 05/13/2012   AST 14 05/13/2012   ALKPHOS 65 05/13/2012   BILITOT 0.3 05/13/2012   Lab Results  Component Value Date   IRON 104 05/13/2012   TIBC 285 05/13/2012   Lab Results   Component Value Date   TSH 0.823 05/13/2012   Lab Results  Component Value Date   WBC 4.7 05/13/2012   HGB 13.0 05/13/2012   HCT 39.1 05/13/2012   MCV 90.5 05/13/2012   PLT 397 05/13/2012    Imaging Studies: No results found.

## 2012-11-19 NOTE — Assessment & Plan Note (Addendum)
Worsening chronic constipation. May go up to 2 weeks at a time without a bowel movement. Has been on MiraLax 17 g daily chronically without improvement in bowel movement frequency. No prior colonoscopy. TSH and calcium level are normal. Left upper quadrant pain likely related to constipation. Recommend colonoscopy for bowel change.  I have discussed the risks, alternatives, benefits with regards to but not limited to the risk of reaction to medication, bleeding, infection, perforation and the patient is agreeable to proceed. Written consent to be obtained.  Add Linzess daily. Increase dietary fiber, goal of 20-25g/day. Increase activity level. Consume 100 ounces of non-caffeinated fluids daily.

## 2012-11-19 NOTE — Assessment & Plan Note (Signed)
History of chronic iron deficiency anemia. Has been on iron supplement chronically. Last hemoglobin and iron level six months ago normal.

## 2012-11-19 NOTE — Progress Notes (Signed)
Cc PCP 

## 2012-11-19 NOTE — Assessment & Plan Note (Addendum)
Difficult to manage GERD. Has chronically been on PPI therapy. Previously liked pantoprazole and AcipHex but was not covered by her insurance. Complains of dysphagia to solid foods, pills. Reports unremarkable Bravo pH study in 2011 on omeprazole at that time. Offered EGD with dilation in the near future.  I have discussed the risks, alternatives, benefits with regards to but not limited to the risk of reaction to medication, bleeding, infection, perforation and the patient is agreeable to proceed. Written consent to be obtained.  Sent RX for pantoprazole to pharmacy to see if covered by insurance.

## 2012-11-19 NOTE — Patient Instructions (Signed)
1. Start Linzess one before breakfast daily. If BMs are too frequent, then stop Miralax. 2. We have scheduled you for a colonoscopy and upper endoscopy. Please see separate instructions.  3. Increase your fluid intake. You need to consume at least 100 ounces of noncaffeinated beverages daily. 4. Increase your activity level. Begin walking 30 minutes 3 times weekly for starters. 5. Increase dietary fiber, goal of 20-25 g per day.

## 2012-12-03 ENCOUNTER — Ambulatory Visit (HOSPITAL_COMMUNITY)
Admission: RE | Admit: 2012-12-03 | Discharge: 2012-12-03 | Disposition: A | Discharge status: Home / Self Care | Payer: BC Managed Care – PPO | Source: Ambulatory Visit | Attending: Gastroenterology | Admitting: Gastroenterology

## 2012-12-03 ENCOUNTER — Encounter (HOSPITAL_COMMUNITY)
Admission: RE | Disposition: A | Discharge status: Home / Self Care | Payer: Self-pay | Source: Ambulatory Visit | Attending: Gastroenterology

## 2012-12-03 ENCOUNTER — Encounter (HOSPITAL_COMMUNITY): Payer: Self-pay

## 2012-12-03 DIAGNOSIS — K2990 Gastroduodenitis, unspecified, without bleeding: Secondary | ICD-10-CM

## 2012-12-03 DIAGNOSIS — K6389 Other specified diseases of intestine: Secondary | ICD-10-CM | POA: Insufficient documentation

## 2012-12-03 DIAGNOSIS — K648 Other hemorrhoids: Secondary | ICD-10-CM

## 2012-12-03 DIAGNOSIS — Q393 Congenital stenosis and stricture of esophagus: Secondary | ICD-10-CM

## 2012-12-03 DIAGNOSIS — R1319 Other dysphagia: Secondary | ICD-10-CM

## 2012-12-03 DIAGNOSIS — K5909 Other constipation: Secondary | ICD-10-CM

## 2012-12-03 DIAGNOSIS — R1310 Dysphagia, unspecified: Secondary | ICD-10-CM

## 2012-12-03 DIAGNOSIS — K3189 Other diseases of stomach and duodenum: Secondary | ICD-10-CM

## 2012-12-03 DIAGNOSIS — K2970 Gastritis, unspecified, without bleeding: Secondary | ICD-10-CM

## 2012-12-03 DIAGNOSIS — R1012 Left upper quadrant pain: Secondary | ICD-10-CM

## 2012-12-03 DIAGNOSIS — Q391 Atresia of esophagus with tracheo-esophageal fistula: Secondary | ICD-10-CM | POA: Insufficient documentation

## 2012-12-03 DIAGNOSIS — A048 Other specified bacterial intestinal infections: Secondary | ICD-10-CM | POA: Insufficient documentation

## 2012-12-03 DIAGNOSIS — R1013 Epigastric pain: Secondary | ICD-10-CM

## 2012-12-03 DIAGNOSIS — K2940 Chronic atrophic gastritis without bleeding: Secondary | ICD-10-CM | POA: Insufficient documentation

## 2012-12-03 DIAGNOSIS — R198 Other specified symptoms and signs involving the digestive system and abdomen: Secondary | ICD-10-CM | POA: Insufficient documentation

## 2012-12-03 DIAGNOSIS — K449 Diaphragmatic hernia without obstruction or gangrene: Secondary | ICD-10-CM | POA: Insufficient documentation

## 2012-12-03 DIAGNOSIS — K219 Gastro-esophageal reflux disease without esophagitis: Secondary | ICD-10-CM

## 2012-12-03 DIAGNOSIS — D509 Iron deficiency anemia, unspecified: Secondary | ICD-10-CM

## 2012-12-03 HISTORY — PX: COLONOSCOPY, ESOPHAGOGASTRODUODENOSCOPY (EGD) AND ESOPHAGEAL DILATION: SHX5781

## 2012-12-03 SURGERY — COLONOSCOPY, ESOPHAGOGASTRODUODENOSCOPY (EGD) AND ESOPHAGEAL DILATION (ED)
Anesthesia: Moderate Sedation

## 2012-12-03 MED ORDER — MINERAL OIL PO OIL
TOPICAL_OIL | ORAL | Status: AC
  Filled 2012-12-03: qty 30

## 2012-12-03 MED ORDER — MEPERIDINE HCL 100 MG/ML IJ SOLN
INTRAMUSCULAR | Status: DC | PRN
  Administered 2012-12-03 (×3): 25 mg via INTRAVENOUS

## 2012-12-03 MED ORDER — STERILE WATER FOR IRRIGATION IR SOLN
Status: DC | PRN
  Administered 2012-12-03: 12:00:00

## 2012-12-03 MED ORDER — MIDAZOLAM HCL 5 MG/5ML IJ SOLN
INTRAMUSCULAR | Status: AC
  Filled 2012-12-03: qty 10

## 2012-12-03 MED ORDER — MEPERIDINE HCL 100 MG/ML IJ SOLN
INTRAMUSCULAR | Status: AC
  Filled 2012-12-03: qty 2

## 2012-12-03 MED ORDER — MIDAZOLAM HCL 5 MG/5ML IJ SOLN
INTRAMUSCULAR | Status: DC | PRN
Start: 2012-12-03 — End: 2012-12-03
  Administered 2012-12-03: 1 mg via INTRAVENOUS
  Administered 2012-12-03 (×3): 2 mg via INTRAVENOUS
  Administered 2012-12-03: 1 mg via INTRAVENOUS

## 2012-12-03 MED ORDER — SODIUM CHLORIDE 0.9 % IV SOLN
INTRAVENOUS | Status: DC
  Administered 2012-12-03: 11:00:00 via INTRAVENOUS

## 2012-12-03 NOTE — Op Note (Signed)
[facility] [address] [place], [address]   ENDOSCOPY PROCEDURE REPORT  PATIENT: Brooke [name], Brooke [name]  MR#: [id number] BIRTHDATE: 15-Aug-1970 , 42  yrs. old GENDER: Female  ENDOSCOPIST: Jonette Eva, MD REFFERED [id number] Zinc, M.D.  PROCEDURE DATE:  12/03/2012 PROCEDURE:   EGD with biopsy and EGD with dilatation over guidewire   INDICATIONS:1.  dysphagia.   2.  dyspepsia. MEDICATIONS: Versed 2 mg IV TOPICAL ANESTHETIC: Cetacaine Spray  DESCRIPTION OF PROCEDURE:   After the risks benefits and alternatives of the procedure were thoroughly explained, informed consent was obtained.  The EC-3890Li ([id number]) and EG-[id number] ([id number])  endoscope was introduced through the mouth and advanced to the second portion of the duodenum. The instrument was slowly withdrawn as the mucosa was carefully examined.  Prior to withdrawal of the scope, the guidwire was placed.  The esophagus was dilated successfully.  The patient was recovered in endoscopy and discharged home in satisfactory condition.   ESOPHAGUS: An esophageal web was found in the lower third of the esophagus.  Multiple biopsies were performed using cold forceps. Sample obtained for evaluation of eosinophilic esophagitis.   A medium sized hiatal hernia was noted.   STOMACH: Mild non-erosive gastritis (inflammation) was found in the gastric antrum.  Multiple biopsies were performed using cold forceps.   DUODENUM: The duodenal mucosa showed no abnormalities in the bulb and second portion of the duodenum.   Dilation was then performed at the distal esophagus Dilator: Savary over guidewire Size(s): 14-16 mm Resistance: minimal Heme: none  COMPLICATIONS: There were no complications.  ENDOSCOPIC IMPRESSION: 1.   Esophageal web 2.   Medium sized hiatal hernia 3.   MILD Non-erosive gastritis  RECOMMENDATIONS:  CONTINUE YOUR WEIGHT LOSS EFFORTS. CONTINUE PROTONIX.  TAKE 30 MINUTES PRIOR TO MEALS TWICE  DAILY. CONTINUE LINZESS. TAKE MIRALAX one to three times a day to have a BM 1-2 times a week. FOLLOW A HIGH FIBER/LOW FAT DIET.  AVOID ITEMS THAT CAUSE BLOATING.  BIOPSY WILL BE BACK IN 7 DAYS. Follow up in 4 mos.  Next colonoscopy in 10 years.     _______________________________ Rosalie DoctorJonette Eva, MD 12/03/2012 1:15 PM      PATIENT NAME:  Brooke [name], Brooke [name] MR#: [id number]

## 2012-12-03 NOTE — Progress Notes (Signed)
REVIEWED.  

## 2012-12-03 NOTE — H&P (Signed)
Primary Care Physician:  Toma Deiters, MD Primary Gastroenterologist:  Dr. Darrick Penna  Pre-Procedure History & Physical: HPI:  Brooke [name] is a 42 y.o. female here for Change in bowel habits/DYSPEPSIA.  Past Medical History  Diagnosis Date  . Acid reflux   . Anemia   . Depression   . Anxiety     Past Surgical History  Procedure Laterality Date  . Tubal ligation    . Esophagogastroduodenoscopy  03/09/09    Dr. Charlott Rakes, normal EGD, s/p Bravo capsule placement    Prior to Admission medications   Medication Sig Start Date End Date Taking? Authorizing Provider  ALPRAZolam Prudy Feeler) 0.5 MG tablet Take 0.5 mg by mouth daily as needed for anxiety. 02/05/12 02/04/13 Yes Salley Scarlet, MD  alum & mag hydroxide-simeth (MAALOX/MYLANTA) 200-200-20 MG/5ML suspension Take by mouth every 6 (six) hours as needed for indigestion or heartburn.   Yes Historical Provider, MD  BIOTIN PO Take 2 tablets by mouth daily.    Yes Historical Provider, MD  cholecalciferol (VITAMIN D) 1000 UNITS tablet Take 2,000 Units by mouth daily.    Yes Historical Provider, MD  Cyanocobalamin (B-12 PO) Take 1 tablet by mouth daily.    Yes Historical Provider, MD  ferrous sulfate 325 (65 FE) MG EC tablet Take 325 mg by mouth daily with breakfast.   Yes Historical Provider, MD  ibuprofen (ADVIL,MOTRIN) 600 MG tablet Take 1 tablet (600 mg total) by mouth every 6 (six) hours as needed for pain. 04/01/12  Yes Salley Scarlet, MD  Linaclotide [facility]) 290 MCG CAPS capsule Take 1 capsule (290 mcg total) by mouth daily. 11/19/12  Yes Tiffany Kocher, PA-C  omeprazole (PRILOSEC) 40 MG capsule Take 1 capsule (40 mg total) by mouth daily. 01/03/12  Yes Salley Scarlet, MD  pantoprazole (PROTONIX) 40 MG tablet Take 1 tablet (40 mg total) by mouth daily. 11/19/12  Yes Tiffany Kocher, PA-C  polyethylene glycol powder (MIRALAX) powder Take 17 g by mouth daily. 01/03/12  Yes Salley Scarlet, MD  topiramate (TOPAMAX) 100 MG tablet Take  100 mg by mouth daily.   Yes Historical Provider, MD  venlafaxine XR (EFFEXOR-XR) 75 MG 24 hr capsule Take 75 mg by mouth 2 (two) times daily. 05/13/12 05/13/13 Yes Salley Scarlet, MD    Allergies as of 11/19/2012  . (No Known Allergies)    Family History  Problem Relation Age of Onset  . Diabetes Mother   . Hypertension Mother   . Hypertension Father   . Diabetes Sister   . Hypertension Sister   . Hypertension Brother   . Cancer Maternal Grandmother     breast  . Diabetes Paternal Grandmother   . Hypertension Brother   . Hypertension Brother   . Colon cancer Neg Hx     History   Social History  . Marital Status: Married    Spouse Name: N/A    Number of Children: 2  . Years of Education: N/A   Occupational History  . [employer]    Social History Main Topics  . Smoking status: Current Every Day Smoker -- 0.30 packs/day    Types: Cigarettes  . Smokeless tobacco: Not on file     Comment: less than 1/2 pack cigarettes daily  . Alcohol Use: Yes     Comment: rare  . Drug Use: No  . Sexual Activity: Not on file   Other Topics Concern  . Not on file   Social History Narrative  .  No narrative on file    Review of Systems: See HPI, otherwise negative ROS   Physical Exam: BP 135/74  Pulse 62  Temp(Src) 98.2 F (36.8 C) (Oral)  Resp 16  Ht 5\' 2"  (1.575 m)  Wt 220 lb (99.791 kg)  BMI 40.23 kg/m2  SpO2 98%  LMP 12/03/2012 General:   Alert,  pleasant and cooperative in NAD Head:  Normocephalic and atraumatic. Neck:  Supple; Lungs:  Clear throughout to auscultation.    Heart:  Regular rate and rhythm. Abdomen:  Soft, nontender and nondistended. Normal bowel sounds, without guarding, and without rebound.   Neurologic:  Alert and  oriented x4;  grossly normal neurologically.  Impression/Plan:   Change in bowel habits/DYSPEPSIA  PLAN:  1. TCS/EGD TODAY

## 2012-12-03 NOTE — Op Note (Signed)
[facility] [address] [place], [address]   COLONOSCOPY PROCEDURE REPORT  PATIENT: Brooke [name], Brooke [name]  MR#: [id number] BIRTHDATE: [birth year] , 42  yrs. old GENDER: Female ENDOSCOPIST: Jonette Eva, MD REFERRED [id number] Iosco, M.D. PROCEDURE DATE:  12/03/2012 PROCEDURE:   Colonoscopy, diagnostic INDICATIONS:Change in bowel habits. MEDICATIONS: Demerol 75 mg IV and Versed 6 mg IV  DESCRIPTION OF PROCEDURE:    Physical exam was performed.  Informed consent was obtained from the patient after explaining the benefits, risks, and alternatives to procedure.  The patient was connected to monitor and placed in left lateral position. Continuous oxygen was provided by nasal cannula and IV medicine administered through an indwelling cannula.  After administration of sedation and rectal exam, the patients rectum was intubated and the EC-3890Li ([id number])  colonoscope was advanced under direct visualization to the cecum.  The scope was removed slowly by carefully examining the color, texture, anatomy, and integrity mucosa on the way out.  The patient was recovered in endoscopy and discharged home in satisfactory condition.    COLON FINDINGS: Moderate melanosis was found throughout the entire examined colon.  The colon IS redundant.  Manual abdominal counter-pressure was used to reach the cecum, A normal appearing cecum, ileocecal valve, and appendiceal orifice were identified. The ascending, hepatic flexure, transverse, splenic flexure, descending, sigmoid colon and rectum appeared unremarkable.  No polyps or cancers were seen.  , and Small internal hemorrhoids were found.  PREP QUALITY: good.   CECAL W/D TIME: 13 minutes     COMPLICATIONS: None  ENDOSCOPIC IMPRESSION: 1.   Moderate melanosis throughout the entire examined colon 2.   The colon IS redundant 3.   Small internal hemorrhoids  RECOMMENDATIONS: CONTINUE YOUR WEIGHT LOSS EFFORTS. CONTINUE PROTONIX.   TAKE 30 MINUTES PRIOR TO MEALS TWICE DAILY. CONTINUE LINZESS. TAKE MIRALAX one to three times a day to have a BM 1-2 times a week. FOLLOW A HIGH FIBER/LOW FAT DIET.  AVOID ITEMS THAT CAUSE BLOATING.  BIOPSY WILL BE BACK IN 7 DAYS. Follow up in 4 mos.  Next colonoscopy in 10 years.       _______________________________ Rosalie DoctorJonette Eva, MD 12/03/2012 1:09 PM     PATIENT NAME:  Brooke [name], Brooke [name] MR#: [id number]

## 2012-12-09 ENCOUNTER — Encounter (HOSPITAL_COMMUNITY): Payer: Self-pay | Admitting: Gastroenterology

## 2012-12-11 ENCOUNTER — Telehealth: Payer: Self-pay

## 2012-12-11 NOTE — Telephone Encounter (Signed)
Pt called for results from her path on procedures she had done on 12/03/2012.

## 2012-12-15 ENCOUNTER — Telehealth: Payer: Self-pay | Admitting: Gastroenterology

## 2012-12-15 NOTE — Telephone Encounter (Signed)
PLEASE CALL PT. Her stomach Bx showed H. Pylori infection. She needs AMOXICILLIN 500 mg 2 po BID for 10 days and Biaxin 500 mg po bid for 10 days. CONTINUE PROTONIX. TAKE 30 MINUTES PRIOR TO MEALS TWICE DAILY. Med side effects include NVD, abd pain, and metallic taste. HER ESOPHAGUS BIOPSIES ARE NORMAL. HER STRICTURE IS DUE TO UNCONTROLLED REFLUX.  CONTINUE YOUR WEIGHT LOSS EFFORTS.  CONTINUE LINZESS.  TAKE MIRALAX one to three times a day to have a BM 1-2 times a week.  FOLLOW A HIGH FIBER/LOW FAT DIET. AVOID ITEMS THAT CAUSE BLOATING.  Follow up in 4 mos E30 DYSPHAGIA/ H PYLORI W/ LL OR AS.Marland Kitchen  Next colonoscopy in 10 years.

## 2012-12-15 NOTE — Telephone Encounter (Signed)
MED LIST REVIEWED WITH MICHELLE. BIAXIN MAY REDUCE EFFECTIVENESS OF EFFEXOR BUT NOT USUALLY A CONCERN SHORT TERM.

## 2012-12-15 NOTE — Telephone Encounter (Signed)
Pt has OV for 4/2 at 9 with AS and reminder in epic for 10 yr TCS

## 2012-12-15 NOTE — Telephone Encounter (Signed)
LMOM at home for a return call. Called work. They trnasferred and no one ever picked up.

## 2012-12-15 NOTE — Telephone Encounter (Signed)
Pt was returning call to DS, but I told patient that DS was on another line. Pt said that she could call her back at [phone]

## 2012-12-16 NOTE — Telephone Encounter (Signed)
See result note.  

## 2012-12-16 NOTE — Telephone Encounter (Signed)
Pt had left message for me to call her at [phone]. I called and got Vm and asked her to return call. ( Also need to know which pharmacy to use).

## 2012-12-16 NOTE — Telephone Encounter (Signed)
Pt returned call and was informed of results. Called the prescriptions to [facility] to [place]. She said pt's prescription for the protonix on hand, was one daily. So she took separate order for the bid dosing for 10 days.

## 2013-04-02 ENCOUNTER — Ambulatory Visit: Payer: BC Managed Care – PPO | Admitting: Gastroenterology

## 2013-04-09 ENCOUNTER — Ambulatory Visit: Payer: BC Managed Care – PPO | Admitting: Gastroenterology

## 2013-04-09 ENCOUNTER — Telehealth: Payer: Self-pay | Admitting: Gastroenterology

## 2013-04-09 NOTE — Telephone Encounter (Signed)
Please send letter.

## 2013-04-09 NOTE — Telephone Encounter (Signed)
Pt was a no show

## 2013-04-13 ENCOUNTER — Other Ambulatory Visit: Payer: Self-pay | Admitting: Obstetrics and Gynecology

## 2013-04-13 ENCOUNTER — Encounter: Payer: Self-pay | Admitting: Gastroenterology

## 2013-04-13 DIAGNOSIS — N6310 Unspecified lump in the right breast, unspecified quadrant: Secondary | ICD-10-CM

## 2013-04-13 NOTE — Telephone Encounter (Signed)
Mailed letter °

## 2013-04-23 ENCOUNTER — Ambulatory Visit
Admission: RE | Admit: 2013-04-23 | Discharge: 2013-04-23 | Disposition: A | Discharge status: Home / Self Care | Payer: BC Managed Care – PPO | Source: Ambulatory Visit | Attending: Obstetrics and Gynecology | Admitting: Obstetrics and Gynecology

## 2013-04-23 ENCOUNTER — Encounter (INDEPENDENT_AMBULATORY_CARE_PROVIDER_SITE_OTHER): Payer: Self-pay

## 2013-04-23 DIAGNOSIS — N6310 Unspecified lump in the right breast, unspecified quadrant: Secondary | ICD-10-CM

## 2015-01-02 HISTORY — PX: CERVICAL ABLATION: SHX5771

## 2015-04-07 DIAGNOSIS — Z6841 Body Mass Index (BMI) 40.0 and over, adult: Secondary | ICD-10-CM | POA: Diagnosis not present

## 2015-04-07 DIAGNOSIS — I10 Essential (primary) hypertension: Secondary | ICD-10-CM | POA: Diagnosis not present

## 2015-04-07 DIAGNOSIS — F418 Other specified anxiety disorders: Secondary | ICD-10-CM | POA: Diagnosis not present

## 2015-04-07 DIAGNOSIS — K210 Gastro-esophageal reflux disease with esophagitis: Secondary | ICD-10-CM | POA: Diagnosis not present

## 2015-08-19 ENCOUNTER — Emergency Department (HOSPITAL_COMMUNITY)
Admission: EM | Admit: 2015-08-19 | Discharge: 2015-08-19 | Disposition: A | Discharge status: Home / Self Care | Payer: BLUE CROSS/BLUE SHIELD | Attending: Emergency Medicine | Admitting: Emergency Medicine

## 2015-08-19 ENCOUNTER — Encounter (HOSPITAL_COMMUNITY): Payer: Self-pay | Admitting: Emergency Medicine

## 2015-08-19 ENCOUNTER — Emergency Department (HOSPITAL_COMMUNITY): Payer: BLUE CROSS/BLUE SHIELD

## 2015-08-19 DIAGNOSIS — F17210 Nicotine dependence, cigarettes, uncomplicated: Secondary | ICD-10-CM | POA: Insufficient documentation

## 2015-08-19 DIAGNOSIS — R079 Chest pain, unspecified: Secondary | ICD-10-CM | POA: Diagnosis not present

## 2015-08-19 DIAGNOSIS — Z79899 Other long term (current) drug therapy: Secondary | ICD-10-CM | POA: Insufficient documentation

## 2015-08-19 DIAGNOSIS — I10 Essential (primary) hypertension: Secondary | ICD-10-CM | POA: Insufficient documentation

## 2015-08-19 DIAGNOSIS — R0789 Other chest pain: Secondary | ICD-10-CM | POA: Insufficient documentation

## 2015-08-19 HISTORY — DX: Essential (primary) hypertension: I10

## 2015-08-19 LAB — CBC
HEMATOCRIT: 37.7 % (ref 36.0–46.0)
HEMOGLOBIN: 12.5 g/dL (ref 12.0–15.0)
MCH: 29.8 pg (ref 26.0–34.0)
MCHC: 33.2 g/dL (ref 30.0–36.0)
MCV: 90 fL (ref 78.0–100.0)
Platelets: 375 10*3/uL (ref 150–400)
RBC: 4.19 MIL/uL (ref 3.87–5.11)
RDW: 14.7 % (ref 11.5–15.5)
WBC: 5.6 10*3/uL (ref 4.0–10.5)

## 2015-08-19 LAB — COMPREHENSIVE METABOLIC PANEL
ALBUMIN: 3.5 g/dL (ref 3.5–5.0)
ALK PHOS: 50 U/L (ref 38–126)
ALT: 15 U/L (ref 14–54)
ANION GAP: 7 (ref 5–15)
AST: 16 U/L (ref 15–41)
BILIRUBIN TOTAL: 0.4 mg/dL (ref 0.3–1.2)
BUN: 13 mg/dL (ref 6–20)
CALCIUM: 8.1 mg/dL — AB (ref 8.9–10.3)
CO2: 19 mmol/L — ABNORMAL LOW (ref 22–32)
Chloride: 114 mmol/L — ABNORMAL HIGH (ref 101–111)
Creatinine, Ser: 1 mg/dL (ref 0.44–1.00)
GFR calc Af Amer: >60 mL/min (ref >60)
GLUCOSE: 86 mg/dL (ref 65–99)
Potassium: 3.3 mmol/L — ABNORMAL LOW (ref 3.5–5.1)
Sodium: 140 mmol/L (ref 135–145)
TOTAL PROTEIN: 6.7 g/dL (ref 6.5–8.1)

## 2015-08-19 LAB — URINALYSIS, ROUTINE W REFLEX MICROSCOPIC
BILIRUBIN URINE: NEGATIVE
GLUCOSE, UA: NEGATIVE mg/dL
Hgb urine dipstick: NEGATIVE
KETONES UR: NEGATIVE mg/dL
Leukocytes, UA: NEGATIVE
NITRITE: NEGATIVE
Protein, ur: NEGATIVE mg/dL
Specific Gravity, Urine: 1.02 (ref 1.005–1.030)
pH: 6 (ref 5.0–8.0)

## 2015-08-19 LAB — TROPONIN I: Troponin I: <0.03 ng/mL (ref <0.03)

## 2015-08-19 LAB — D-DIMER, QUANTITATIVE: D-Dimer, Quant: 0.27 ug/mL-FEU (ref 0.00–0.50)

## 2015-08-19 MED ORDER — FENTANYL CITRATE (PF) 100 MCG/2ML IJ SOLN
50.0000 ug | INTRAMUSCULAR | Status: DC | PRN
  Administered 2015-08-19: 50 ug via INTRAVENOUS
  Filled 2015-08-19: qty 2

## 2015-08-19 MED ORDER — ASPIRIN 81 MG PO CHEW
324.0000 mg | CHEWABLE_TABLET | Freq: Once | ORAL | Status: AC
  Administered 2015-08-19: 324 mg via ORAL
  Filled 2015-08-19: qty 4

## 2015-08-19 NOTE — ED Provider Notes (Signed)
[facility] DEPT Provider Note   CSN: [id number] Arrival date & time: 08/19/15  1653     History   Chief Complaint Chief Complaint  Patient presents with  . Chest Pain    HPI Brooke [name] is a 45 y.o. female.  45 yo female with htn hx, no significant FH of clotting/ CAD presents with chest pain since this morning.  Woke her from sleep, mild radiation down left arm.  Constant since this am, mild ache.  No hx of similar.  No recent exertional sxs.  No MI hx.  Mild pleuritic component.  No blood clot risk factors.  Nothing has improved.    The history is provided by the patient.  Chest Pain   Pertinent negatives include no abdominal pain, no back pain, no fever, no headaches, no shortness of breath and no vomiting.    Past Medical History:  Diagnosis Date  . Acid reflux   . Anemia   . Anxiety   . Depression   . Hypertension     Patient Active Problem List   Diagnosis Date Noted  . Morbid obesity ([place]) 12/03/2012  . LUQ pain 11/19/2012  . Esophageal dysphagia 11/19/2012  . Other malaise and fatigue 05/19/2012  . Headache(784.0) 04/01/2012  . Amenorrhea 02/06/2012  . Depression 02/06/2012  . Panic attacks 02/06/2012  . Sinusitis 01/03/2012  . ANEMIA, IRON DEFICIENCY 05/05/2009  . NICOTINE ADDICTION 03/04/2009  . GERD 03/04/2009  . CONSTIPATION, CHRONIC 03/04/2009    Past Surgical History:  Procedure Laterality Date  . COLONOSCOPY, ESOPHAGOGASTRODUODENOSCOPY (EGD) AND ESOPHAGEAL DILATION N/A 12/03/2012   [id number] melanosis throughout the entire examined colon/The colon IS redundant/Small internal hemorrhoids/EGD:Esophageal web/Medium sized hiatal hernia/MILD Non-erosive gastritis  . ESOPHAGOGASTRODUODENOSCOPY  03/09/09   Dr. Wilford Corner, normal EGD, s/p Bravo capsule placement  . TUBAL LIGATION      OB History    No data available       Home Medications    Prior to Admission medications   Medication Sig Start Date End Date Taking?  Authorizing Provider  alum & mag hydroxide-simeth (MAALOX/MYLANTA) 200-200-20 MG/5ML suspension Take by mouth every 6 (six) hours as needed for indigestion or heartburn.    Historical Provider, MD  BIOTIN PO Take 2 tablets by mouth daily.     Historical Provider, MD  cholecalciferol (VITAMIN D) 1000 UNITS tablet Take 2,000 Units by mouth daily.     Historical Provider, MD  Cyanocobalamin (B-12 PO) Take 1 tablet by mouth daily.     Historical Provider, MD  ferrous sulfate 325 (65 FE) MG EC tablet Take 325 mg by mouth daily with breakfast.    Historical Provider, MD  ibuprofen (ADVIL,MOTRIN) 600 MG tablet Take 1 tablet (600 mg total) by mouth every 6 (six) hours as needed for pain. 04/01/12   Alycia Rossetti, MD  Linaclotide [facility]) 290 MCG CAPS capsule Take 1 capsule (290 mcg total) by mouth daily. 11/19/12   [name] Menghini, PA-C  omeprazole (PRILOSEC) 40 MG capsule Take 1 capsule (40 mg total) by mouth daily. 01/03/12   Alycia Rossetti, MD  pantoprazole (PROTONIX) 40 MG tablet Take 1 tablet (40 mg total) by mouth daily. 11/19/12   [name] Menghini, PA-C  polyethylene glycol powder (MIRALAX) powder Take 17 g by mouth daily. 01/03/12   Alycia Rossetti, MD  topiramate (TOPAMAX) 100 MG tablet Take 100 mg by mouth daily.    Historical Provider, MD  venlafaxine XR (EFFEXOR-XR) 75 MG 24 hr capsule Take 75  mg by mouth 2 (two) times daily. 05/13/12 05/13/13  Alycia Rossetti, MD    Family History Family History  Problem Relation Age of Onset  . Diabetes Mother   . Hypertension Mother   . Hypertension Father   . Diabetes Sister   . Hypertension Sister   . Hypertension Brother   . Diabetes Paternal Grandmother   . Hypertension Brother   . Hypertension Brother   . Cancer Maternal Grandmother     breast  . Colon cancer Neg Hx     Social History Social History  Substance Use Topics  . Smoking status: Current Every Day Smoker    Packs/day: 0.30    Types: Cigarettes  . Smokeless tobacco: Not on file      Comment: less than 1/2 pack cigarettes daily  . Alcohol use Yes     Comment: rare     Allergies   Review of patient's allergies indicates no known allergies.   Review of Systems Review of Systems  Constitutional: Negative for chills and fever.  HENT: Negative for congestion.   Eyes: Negative for visual disturbance.  Respiratory: Negative for shortness of breath.   Cardiovascular: Positive for chest pain.  Gastrointestinal: Negative for abdominal pain and vomiting.  Genitourinary: Negative for dysuria and flank pain.  Musculoskeletal: Negative for back pain, neck pain and neck stiffness.  Skin: Negative for rash.  Neurological: Negative for light-headedness and headaches.     Physical Exam Updated Vital Signs BP 121/80   Pulse 77   Temp 98.5 F (36.9 C) (Temporal)   Resp 14   Ht 5\' 2"  (1.575 m)   Wt 205 lb (93 kg)   LMP 08/03/2015   SpO2 100%   BMI 37.49 kg/m   Physical Exam  Constitutional: She is oriented to person, place, and time. She appears well-developed and well-nourished.  HENT:  Head: Normocephalic and atraumatic.  Eyes: Conjunctivae are normal. Right eye exhibits no discharge. Left eye exhibits no discharge.  Neck: Normal range of motion. Neck supple. No tracheal deviation present.  Cardiovascular: Normal rate, regular rhythm and intact distal pulses.   No murmur heard. Pulmonary/Chest: Effort normal and breath sounds normal.  Abdominal: Soft. She exhibits no distension. There is no tenderness. There is no guarding.  Musculoskeletal: She exhibits no edema.  Neurological: She is alert and oriented to person, place, and time.  Skin: Skin is warm. No rash noted.  Psychiatric: She has a normal mood and affect.  Nursing note and vitals reviewed.    ED Treatments / Results  Labs (all labs ordered are listed, but only abnormal results are displayed) Labs Reviewed  CBC  TROPONIN I  D-DIMER, QUANTITATIVE (NOT AT [facility])  COMPREHENSIVE METABOLIC  PANEL  URINALYSIS, ROUTINE W REFLEX MICROSCOPIC (NOT AT [facility])    EKG  EKG Interpretation  Date/Time:  Friday August 19 2015 16:55:31 EDT Ventricular Rate:  73 PR Interval:  142 QRS Duration: 82 QT Interval:  396 QTC Calculation: 436 R Axis:   46 Text Interpretation:  Normal sinus rhythm with sinus arrhythmia Nonspecific T wave abnormality Abnormal ECG Confirmed by Reather Converse MD, Takeia Ciaravino [phone]) on 08/19/2015 5:08:02 PM       Radiology Dg Chest 2 View  Result Date: 08/19/2015 CLINICAL DATA:  Left-sided chest pain EXAM: CHEST  2 VIEW COMPARISON:  April 26, 2008 FINDINGS: The heart size and mediastinal contours are within normal limits. Both lungs are clear. The visualized skeletal structures are unremarkable. IMPRESSION: No active cardiopulmonary disease. Electronically Signed  By: Dorise Bullion III M.D   On: 08/19/2015 17:37    Procedures Procedures (including critical care time)  Medications Ordered in ED Medications - No data to display   Initial Impression / Assessment and Plan / ED Course  I have reviewed the triage vital signs and the nursing notes.  Pertinent labs & imaging results that were available during my care of the patient were reviewed by me and considered in my medical decision making (see chart for details).  Clinical Course   Pt with constant chest pain since 4 am.   Low risk cardiac, low risk PE.  Plan for delta troponin and ddimer.   Pain meds/ aspirin.   Pain improved. Delta neg.  Close outpt fup discused.  Results and differential diagnosis were discussed with the patient/parent/guardian. Xrays were independently reviewed by myself.  Close follow up outpatient was discussed, comfortable with the plan.   Medications  fentaNYL (SUBLIMAZE) injection 50 mcg (50 mcg Intravenous Given 08/19/15 1824)  aspirin chewable tablet 324 mg (324 mg Oral Given 08/19/15 1824)    Vitals:   08/19/15 1900 08/19/15 1930 08/19/15 2000 08/19/15 2030  BP: 107/62 121/63  (!) 106/53 108/66  Pulse: 62 63 (!) 59 64  Resp: 25 22 20 17   Temp:      TempSrc:      SpO2: 100% 100% 100% 100%  Weight:      Height:        Final diagnoses:  Other chest pain    Final Clinical Impressions(s) / ED Diagnoses   Final diagnoses:  Other chest pain    New Prescriptions New Prescriptions   No medications on file     Elnora Morrison, MD 08/19/15 2059

## 2015-08-19 NOTE — Discharge Instructions (Signed)
If you were given medicines take as directed.  If you are on coumadin or contraceptives realize their levels and effectiveness is altered by many different medicines.  If you have any reaction (rash, tongues swelling, other) to the medicines stop taking and see a physician.    If your blood pressure was elevated in the ER make sure you follow up for management with a primary doctor or return for chest pain, shortness of breath or stroke symptoms.  Please follow up as directed and return to the ER or see a physician for new or worsening symptoms.  Thank you. Vitals:   08/19/15 1900 08/19/15 1930 08/19/15 2000 08/19/15 2030  BP: 107/62 121/63 (!) 106/53 108/66  Pulse: 62 63 (!) 59 64  Resp: 25 22 20 17   Temp:      TempSrc:      SpO2: 100% 100% 100% 100%  Weight:      Height:

## 2015-08-19 NOTE — ED Triage Notes (Signed)
Pt reports cramping like chest pain going into left neck and left arm that awoke her from sleep around 4am this morning.

## 2015-08-25 DIAGNOSIS — F418 Other specified anxiety disorders: Secondary | ICD-10-CM | POA: Diagnosis not present

## 2015-08-25 DIAGNOSIS — K210 Gastro-esophageal reflux disease with esophagitis: Secondary | ICD-10-CM | POA: Diagnosis not present

## 2015-08-25 DIAGNOSIS — I10 Essential (primary) hypertension: Secondary | ICD-10-CM | POA: Diagnosis not present

## 2015-08-25 DIAGNOSIS — Z6839 Body mass index (BMI) 39.0-39.9, adult: Secondary | ICD-10-CM | POA: Diagnosis not present

## 2015-09-28 DIAGNOSIS — M722 Plantar fascial fibromatosis: Secondary | ICD-10-CM | POA: Diagnosis not present

## 2015-10-24 DIAGNOSIS — K210 Gastro-esophageal reflux disease with esophagitis: Secondary | ICD-10-CM | POA: Diagnosis not present

## 2015-10-24 DIAGNOSIS — Z6839 Body mass index (BMI) 39.0-39.9, adult: Secondary | ICD-10-CM | POA: Diagnosis not present

## 2015-10-24 DIAGNOSIS — I10 Essential (primary) hypertension: Secondary | ICD-10-CM | POA: Diagnosis not present

## 2015-10-24 DIAGNOSIS — M19042 Primary osteoarthritis, left hand: Secondary | ICD-10-CM | POA: Diagnosis not present

## 2015-10-24 DIAGNOSIS — F418 Other specified anxiety disorders: Secondary | ICD-10-CM | POA: Diagnosis not present

## 2016-02-10 DIAGNOSIS — Z1389 Encounter for screening for other disorder: Secondary | ICD-10-CM | POA: Diagnosis not present

## 2016-02-10 DIAGNOSIS — K210 Gastro-esophageal reflux disease with esophagitis: Secondary | ICD-10-CM | POA: Diagnosis not present

## 2016-02-10 DIAGNOSIS — I10 Essential (primary) hypertension: Secondary | ICD-10-CM | POA: Diagnosis not present

## 2016-02-10 DIAGNOSIS — M5432 Sciatica, left side: Secondary | ICD-10-CM | POA: Diagnosis not present

## 2016-02-10 DIAGNOSIS — Z1231 Encounter for screening mammogram for malignant neoplasm of breast: Secondary | ICD-10-CM | POA: Diagnosis not present

## 2016-02-10 DIAGNOSIS — M19042 Primary osteoarthritis, left hand: Secondary | ICD-10-CM | POA: Diagnosis not present

## 2016-02-10 DIAGNOSIS — Z01419 Encounter for gynecological examination (general) (routine) without abnormal findings: Secondary | ICD-10-CM | POA: Diagnosis not present

## 2016-02-10 DIAGNOSIS — Z130 Encounter for screening for diseases of the blood and blood-forming organs and certain disorders involving the immune mechanism: Secondary | ICD-10-CM | POA: Diagnosis not present

## 2016-04-20 DIAGNOSIS — M2550 Pain in unspecified joint: Secondary | ICD-10-CM | POA: Diagnosis not present

## 2016-04-20 DIAGNOSIS — R5383 Other fatigue: Secondary | ICD-10-CM | POA: Diagnosis not present

## 2016-04-20 DIAGNOSIS — E559 Vitamin D deficiency, unspecified: Secondary | ICD-10-CM | POA: Diagnosis not present

## 2016-04-20 DIAGNOSIS — G5603 Carpal tunnel syndrome, bilateral upper limbs: Secondary | ICD-10-CM | POA: Diagnosis not present

## 2016-05-08 DIAGNOSIS — I10 Essential (primary) hypertension: Secondary | ICD-10-CM | POA: Diagnosis not present

## 2016-05-08 DIAGNOSIS — G43109 Migraine with aura, not intractable, without status migrainosus: Secondary | ICD-10-CM | POA: Diagnosis not present

## 2016-05-08 DIAGNOSIS — K210 Gastro-esophageal reflux disease with esophagitis: Secondary | ICD-10-CM | POA: Diagnosis not present

## 2016-05-08 DIAGNOSIS — M19042 Primary osteoarthritis, left hand: Secondary | ICD-10-CM | POA: Diagnosis not present

## 2016-05-09 ENCOUNTER — Encounter (HOSPITAL_COMMUNITY): Payer: Self-pay | Admitting: *Deleted

## 2016-05-09 ENCOUNTER — Emergency Department (HOSPITAL_COMMUNITY)
Admission: EM | Admit: 2016-05-09 | Discharge: 2016-05-09 | Disposition: A | Discharge status: Home / Self Care | Payer: BLUE CROSS/BLUE SHIELD | Attending: Emergency Medicine | Admitting: Emergency Medicine

## 2016-05-09 DIAGNOSIS — Y92410 Unspecified street and highway as the place of occurrence of the external cause: Secondary | ICD-10-CM | POA: Insufficient documentation

## 2016-05-09 DIAGNOSIS — Y999 Unspecified external cause status: Secondary | ICD-10-CM | POA: Insufficient documentation

## 2016-05-09 DIAGNOSIS — Y9389 Activity, other specified: Secondary | ICD-10-CM | POA: Insufficient documentation

## 2016-05-09 DIAGNOSIS — S199XXA Unspecified injury of neck, initial encounter: Secondary | ICD-10-CM | POA: Insufficient documentation

## 2016-05-09 DIAGNOSIS — Z87891 Personal history of nicotine dependence: Secondary | ICD-10-CM | POA: Insufficient documentation

## 2016-05-09 DIAGNOSIS — M542 Cervicalgia: Secondary | ICD-10-CM | POA: Diagnosis not present

## 2016-05-09 MED ORDER — METHOCARBAMOL 500 MG PO TABS: 500.0000 mg | ORAL_TABLET | Freq: Two times a day (BID) | ORAL | 0 refills | Status: DC

## 2016-05-09 MED ORDER — LIDOCAINE 5 % EX PTCH: 1.0000 | MEDICATED_PATCH | CUTANEOUS | 0 refills | Status: DC

## 2016-05-09 MED ORDER — KETOROLAC TROMETHAMINE 60 MG/2ML IM SOLN
60.0000 mg | Freq: Once | INTRAMUSCULAR | Status: AC
  Administered 2016-05-09: 60 mg via INTRAMUSCULAR
  Filled 2016-05-09: qty 2

## 2016-05-09 MED ORDER — IBUPROFEN 600 MG PO TABS: 600.0000 mg | ORAL_TABLET | Freq: Four times a day (QID) | ORAL | 0 refills | Status: DC | PRN

## 2016-05-09 MED ORDER — METHOCARBAMOL 500 MG PO TABS
1000.0000 mg | ORAL_TABLET | Freq: Once | ORAL | Status: AC
  Administered 2016-05-09: 1000 mg via ORAL
  Filled 2016-05-09: qty 2
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
## 2016-05-09 NOTE — Discharge Instructions (Signed)
Expect your soreness to increase over the next 2-3 days. Take it easy, but do not lay around too much as this may make any stiffness worse.  Antiinflammatory medications: Take 500 mg of naproxen every 12 hours or 600 mg of ibuprofen every 6 hours for the next 3 days. Take these medications with food to avoid upset stomach. Choose only one of these medications, do not take them together.  Muscle relaxer: Robaxin is a muscle relaxer and may help loosen stiff muscles. Do not take the Robaxin while driving or performing other dangerous activities.   Lidocaine patches: These are available via either prescription or over-the-counter. The over-the-counter option may be more economical one and are likely just as effective. There are multiple over-the-counter brands, such as Salonpas.  Exercises: Be sure to perform the attached exercises starting with three times a week and working up to performing them daily. This is an essential part of preventing long term problems.   Follow up with a primary care provider for any future management of these complaints.

## 2016-05-16 DIAGNOSIS — S335XXA Sprain of ligaments of lumbar spine, initial encounter: Secondary | ICD-10-CM | POA: Diagnosis not present

## 2016-05-16 DIAGNOSIS — S139XXA Sprain of joints and ligaments of unspecified parts of neck, initial encounter: Secondary | ICD-10-CM | POA: Diagnosis not present

## 2016-05-23 DIAGNOSIS — M50321 Other cervical disc degeneration at C4-C5 level: Secondary | ICD-10-CM | POA: Diagnosis not present

## 2016-05-23 DIAGNOSIS — M542 Cervicalgia: Secondary | ICD-10-CM | POA: Diagnosis not present

## 2016-05-23 DIAGNOSIS — M50322 Other cervical disc degeneration at C5-C6 level: Secondary | ICD-10-CM | POA: Diagnosis not present

## 2016-05-25 DIAGNOSIS — M50321 Other cervical disc degeneration at C4-C5 level: Secondary | ICD-10-CM | POA: Diagnosis not present

## 2016-05-25 DIAGNOSIS — M542 Cervicalgia: Secondary | ICD-10-CM | POA: Diagnosis not present

## 2016-05-25 DIAGNOSIS — M50322 Other cervical disc degeneration at C5-C6 level: Secondary | ICD-10-CM | POA: Diagnosis not present

## 2016-05-30 DIAGNOSIS — S335XXD Sprain of ligaments of lumbar spine, subsequent encounter: Secondary | ICD-10-CM | POA: Diagnosis not present

## 2016-05-30 DIAGNOSIS — S139XXA Sprain of joints and ligaments of unspecified parts of neck, initial encounter: Secondary | ICD-10-CM | POA: Diagnosis not present

## 2016-05-30 DIAGNOSIS — M545 Low back pain: Secondary | ICD-10-CM | POA: Diagnosis not present

## 2016-05-30 DIAGNOSIS — S335XXA Sprain of ligaments of lumbar spine, initial encounter: Secondary | ICD-10-CM | POA: Diagnosis not present

## 2016-06-06 DIAGNOSIS — M545 Low back pain: Secondary | ICD-10-CM | POA: Diagnosis not present

## 2016-06-06 DIAGNOSIS — S335XXD Sprain of ligaments of lumbar spine, subsequent encounter: Secondary | ICD-10-CM | POA: Diagnosis not present

## 2016-06-13 DIAGNOSIS — M545 Low back pain: Secondary | ICD-10-CM | POA: Diagnosis not present

## 2016-06-13 DIAGNOSIS — S335XXD Sprain of ligaments of lumbar spine, subsequent encounter: Secondary | ICD-10-CM | POA: Diagnosis not present

## 2016-06-15 DIAGNOSIS — M542 Cervicalgia: Secondary | ICD-10-CM | POA: Diagnosis not present

## 2016-06-15 DIAGNOSIS — M50321 Other cervical disc degeneration at C4-C5 level: Secondary | ICD-10-CM | POA: Diagnosis not present

## 2016-06-15 DIAGNOSIS — M50322 Other cervical disc degeneration at C5-C6 level: Secondary | ICD-10-CM | POA: Diagnosis not present

## 2016-06-18 DIAGNOSIS — M542 Cervicalgia: Secondary | ICD-10-CM | POA: Diagnosis not present

## 2016-06-18 DIAGNOSIS — M50321 Other cervical disc degeneration at C4-C5 level: Secondary | ICD-10-CM | POA: Diagnosis not present

## 2016-06-18 DIAGNOSIS — M50322 Other cervical disc degeneration at C5-C6 level: Secondary | ICD-10-CM | POA: Diagnosis not present

## 2016-06-21 DIAGNOSIS — M50322 Other cervical disc degeneration at C5-C6 level: Secondary | ICD-10-CM | POA: Diagnosis not present

## 2016-06-21 DIAGNOSIS — M50321 Other cervical disc degeneration at C4-C5 level: Secondary | ICD-10-CM | POA: Diagnosis not present

## 2016-06-21 DIAGNOSIS — M542 Cervicalgia: Secondary | ICD-10-CM | POA: Diagnosis not present

## 2016-06-22 DIAGNOSIS — M5032 Other cervical disc degeneration, mid-cervical region, unspecified level: Secondary | ICD-10-CM | POA: Diagnosis not present

## 2016-06-22 DIAGNOSIS — M545 Low back pain: Secondary | ICD-10-CM | POA: Diagnosis not present

## 2016-06-28 DIAGNOSIS — M545 Low back pain: Secondary | ICD-10-CM | POA: Diagnosis not present

## 2016-06-29 DIAGNOSIS — M545 Low back pain: Secondary | ICD-10-CM | POA: Diagnosis not present

## 2016-06-29 DIAGNOSIS — M47816 Spondylosis without myelopathy or radiculopathy, lumbar region: Secondary | ICD-10-CM | POA: Diagnosis not present

## 2016-07-11 DIAGNOSIS — M50321 Other cervical disc degeneration at C4-C5 level: Secondary | ICD-10-CM | POA: Diagnosis not present

## 2016-07-11 DIAGNOSIS — M50322 Other cervical disc degeneration at C5-C6 level: Secondary | ICD-10-CM | POA: Diagnosis not present

## 2016-07-11 DIAGNOSIS — S335XXD Sprain of ligaments of lumbar spine, subsequent encounter: Secondary | ICD-10-CM | POA: Diagnosis not present

## 2016-07-11 DIAGNOSIS — M545 Low back pain: Secondary | ICD-10-CM | POA: Diagnosis not present

## 2016-07-19 DIAGNOSIS — M545 Low back pain: Secondary | ICD-10-CM | POA: Diagnosis not present

## 2016-07-19 DIAGNOSIS — M47816 Spondylosis without myelopathy or radiculopathy, lumbar region: Secondary | ICD-10-CM | POA: Diagnosis not present

## 2016-08-10 DIAGNOSIS — M545 Low back pain: Secondary | ICD-10-CM | POA: Diagnosis not present

## 2016-08-10 DIAGNOSIS — M47816 Spondylosis without myelopathy or radiculopathy, lumbar region: Secondary | ICD-10-CM | POA: Diagnosis not present

## 2016-08-20 DIAGNOSIS — Z131 Encounter for screening for diabetes mellitus: Secondary | ICD-10-CM | POA: Diagnosis not present

## 2016-08-20 DIAGNOSIS — K2900 Acute gastritis without bleeding: Secondary | ICD-10-CM | POA: Diagnosis not present

## 2016-08-20 DIAGNOSIS — Z6836 Body mass index (BMI) 36.0-36.9, adult: Secondary | ICD-10-CM | POA: Diagnosis not present

## 2016-08-20 DIAGNOSIS — I10 Essential (primary) hypertension: Secondary | ICD-10-CM | POA: Diagnosis not present

## 2016-09-12 DIAGNOSIS — M47816 Spondylosis without myelopathy or radiculopathy, lumbar region: Secondary | ICD-10-CM | POA: Diagnosis not present

## 2016-09-12 DIAGNOSIS — M545 Low back pain: Secondary | ICD-10-CM | POA: Diagnosis not present

## 2016-09-24 ENCOUNTER — Emergency Department (HOSPITAL_COMMUNITY)
Admission: EM | Admit: 2016-09-24 | Discharge: 2016-09-24 | Disposition: A | Discharge status: Home / Self Care | Payer: BLUE CROSS/BLUE SHIELD | Attending: Emergency Medicine | Admitting: Emergency Medicine

## 2016-09-24 ENCOUNTER — Encounter (HOSPITAL_COMMUNITY): Payer: Self-pay | Admitting: *Deleted

## 2016-09-24 ENCOUNTER — Emergency Department (HOSPITAL_COMMUNITY): Payer: BLUE CROSS/BLUE SHIELD

## 2016-09-24 DIAGNOSIS — Z862 Personal history of diseases of the blood and blood-forming organs and certain disorders involving the immune mechanism: Secondary | ICD-10-CM | POA: Diagnosis not present

## 2016-09-24 DIAGNOSIS — R4701 Aphasia: Secondary | ICD-10-CM | POA: Insufficient documentation

## 2016-09-24 DIAGNOSIS — R531 Weakness: Secondary | ICD-10-CM | POA: Diagnosis not present

## 2016-09-24 DIAGNOSIS — Z79899 Other long term (current) drug therapy: Secondary | ICD-10-CM | POA: Diagnosis not present

## 2016-09-24 DIAGNOSIS — G43109 Migraine with aura, not intractable, without status migrainosus: Secondary | ICD-10-CM | POA: Insufficient documentation

## 2016-09-24 DIAGNOSIS — R030 Elevated blood-pressure reading, without diagnosis of hypertension: Secondary | ICD-10-CM | POA: Diagnosis not present

## 2016-09-24 DIAGNOSIS — Z87891 Personal history of nicotine dependence: Secondary | ICD-10-CM | POA: Diagnosis not present

## 2016-09-24 DIAGNOSIS — R404 Transient alteration of awareness: Secondary | ICD-10-CM | POA: Diagnosis not present

## 2016-09-24 DIAGNOSIS — R29818 Other symptoms and signs involving the nervous system: Secondary | ICD-10-CM | POA: Diagnosis not present

## 2016-09-24 DIAGNOSIS — R200 Anesthesia of skin: Secondary | ICD-10-CM | POA: Diagnosis not present

## 2016-09-24 HISTORY — DX: Migraine, unspecified, not intractable, without status migrainosus: G43.909

## 2016-09-24 LAB — I-STAT CHEM 8, ED
BUN: 15 mg/dL (ref 6–20)
CALCIUM ION: 1.08 mmol/L — AB (ref 1.15–1.40)
CREATININE: 1.2 mg/dL — AB (ref 0.44–1.00)
Chloride: 107 mmol/L (ref 101–111)
Glucose, Bld: 90 mg/dL (ref 65–99)
HCT: 40 % (ref 36.0–46.0)
HEMOGLOBIN: 13.6 g/dL (ref 12.0–15.0)
Potassium: 4.1 mmol/L (ref 3.5–5.1)
Sodium: 140 mmol/L (ref 135–145)
TCO2: 20 mmol/L — AB (ref 22–32)

## 2016-09-24 LAB — DIFFERENTIAL
BASOS PCT: 0 %
Basophils Absolute: 0 10*3/uL (ref 0.0–0.1)
Eosinophils Absolute: 0.1 10*3/uL (ref 0.0–0.7)
Eosinophils Relative: 1 %
LYMPHS ABS: 2.6 10*3/uL (ref 0.7–4.0)
Lymphocytes Relative: 44 %
MONO ABS: 0.4 10*3/uL (ref 0.1–1.0)
MONOS PCT: 7 %
NEUTROS ABS: 2.8 10*3/uL (ref 1.7–7.7)
Neutrophils Relative %: 48 %

## 2016-09-24 LAB — CBC
HEMATOCRIT: 39.1 % (ref 36.0–46.0)
HEMOGLOBIN: 12.8 g/dL (ref 12.0–15.0)
MCH: 30.1 pg (ref 26.0–34.0)
MCHC: 32.7 g/dL (ref 30.0–36.0)
MCV: 92 fL (ref 78.0–100.0)
Platelets: 339 10*3/uL (ref 150–400)
RBC: 4.25 MIL/uL (ref 3.87–5.11)
RDW: 13.9 % (ref 11.5–15.5)
WBC: 5.9 10*3/uL (ref 4.0–10.5)

## 2016-09-24 LAB — COMPREHENSIVE METABOLIC PANEL
ALT: 19 U/L (ref 14–54)
AST: 17 U/L (ref 15–41)
Albumin: 3.5 g/dL (ref 3.5–5.0)
Alkaline Phosphatase: 50 U/L (ref 38–126)
Anion gap: 7 (ref 5–15)
BILIRUBIN TOTAL: 0.4 mg/dL (ref 0.3–1.2)
BUN: 14 mg/dL (ref 6–20)
CALCIUM: 8.3 mg/dL — AB (ref 8.9–10.3)
CHLORIDE: 110 mmol/L (ref 101–111)
CO2: 20 mmol/L — ABNORMAL LOW (ref 22–32)
CREATININE: 1.32 mg/dL — AB (ref 0.44–1.00)
GFR, EST AFRICAN AMERICAN: 55 mL/min — AB (ref >60)
GFR, EST NON AFRICAN AMERICAN: 48 mL/min — AB (ref >60)
Glucose, Bld: 91 mg/dL (ref 65–99)
Potassium: 4.1 mmol/L (ref 3.5–5.1)
Sodium: 137 mmol/L (ref 135–145)
TOTAL PROTEIN: 6.2 g/dL — AB (ref 6.5–8.1)

## 2016-09-24 LAB — APTT: aPTT: 29 seconds (ref 24–36)

## 2016-09-24 LAB — PROTIME-INR
INR: 0.99
Prothrombin Time: 12.9 seconds (ref 11.4–15.2)

## 2016-09-24 LAB — I-STAT TROPONIN, ED: TROPONIN I, POC: 0 ng/mL (ref 0.00–0.08)

## 2016-09-24 MED ORDER — DIPHENHYDRAMINE HCL 50 MG/ML IJ SOLN
12.5000 mg | Freq: Once | INTRAMUSCULAR | Status: AC
  Administered 2016-09-24: 12.5 mg via INTRAVENOUS
  Filled 2016-09-24: qty 1

## 2016-09-24 MED ORDER — KETOROLAC TROMETHAMINE 30 MG/ML IJ SOLN
30.0000 mg | Freq: Once | INTRAMUSCULAR | Status: AC
  Administered 2016-09-24: 30 mg via INTRAVENOUS
  Filled 2016-09-24: qty 1

## 2016-09-24 MED ORDER — PROCHLORPERAZINE EDISYLATE 5 MG/ML IJ SOLN
10.0000 mg | Freq: Once | INTRAMUSCULAR | Status: AC
  Administered 2016-09-24: 10 mg via INTRAVENOUS
  Filled 2016-09-24: qty 2

## 2016-09-24 MED ORDER — GADOBENATE DIMEGLUMINE 529 MG/ML IV SOLN
20.0000 mL | Freq: Once | INTRAVENOUS | Status: AC
  Administered 2016-09-24: 20 mL via INTRAVENOUS

## 2016-09-24 MED ORDER — BUTALBITAL-APAP-CAFFEINE 50-325-40 MG PO TABS: 1.0000 | ORAL_TABLET | Freq: Four times a day (QID) | ORAL | 0 refills | Status: DC | PRN

## 2016-09-24 NOTE — ED Notes (Signed)
PT back from MRI.  EDP speaking with pt.  PT ao x 4.

## 2016-09-24 NOTE — ED Provider Notes (Signed)
[place] DEPT Provider Note   CSN: [id number] Arrival date & time: 09/24/16  1030     History   Chief Complaint Chief Complaint  Patient presents with  . Code Stroke    HPI Brooke [name] is a 46 y.o. female.  HPI Patient presents to the emergency room for evaluation of difficulty speaking, weakness, and numbness. Patient was at work today when she had sudden onset of numbness and tingling and felt weak all throughout her body. EMS was called and they activated a code stroke. Her blood pressure was initially elevated at 192/122.  Patient states her symptoms seem to be improving now. She does have history of migraine headaches but denies any headache today. She does not have historyof stroke or TIA. Past Medical History:  Diagnosis Date  . Acid reflux   . Anemia   . Anxiety   . Depression   . Migraines     Patient Active Problem List   Diagnosis Date Noted  . Morbid obesity ([place]) 12/03/2012  . LUQ pain 11/19/2012  . Esophageal dysphagia 11/19/2012  . Other malaise and fatigue 05/19/2012  . Headache(784.0) 04/01/2012  . Amenorrhea 02/06/2012  . Depression 02/06/2012  . Panic attacks 02/06/2012  . Sinusitis 01/03/2012  . ANEMIA, IRON DEFICIENCY 05/05/2009  . NICOTINE ADDICTION 03/04/2009  . GERD 03/04/2009  . CONSTIPATION, CHRONIC 03/04/2009    Past Surgical History:  Procedure Laterality Date  . COLONOSCOPY, ESOPHAGOGASTRODUODENOSCOPY (EGD) AND ESOPHAGEAL DILATION N/A 12/03/2012   PZW:CHENIDPO melanosis throughout the entire examined colon/The colon IS redundant/Small internal hemorrhoids/EGD:Esophageal web/Medium sized hiatal hernia/MILD Non-erosive gastritis  . ESOPHAGOGASTRODUODENOSCOPY  03/09/09   Dr. Wilford Corner, normal EGD, s/p Bravo capsule placement  . TUBAL LIGATION    . WISDOM TOOTH EXTRACTION      OB History    No data available       Home Medications    Prior to Admission medications   Medication Sig Start Date End Date Taking?  Authorizing Provider  BIOTIN PO Take 2 tablets by mouth daily.    Yes [provider]  buPROPion (WELLBUTRIN SR) 200 MG 12 hr tablet Take 200 mg by mouth 2 (two) times daily. 07/18/16  Yes [provider]  CALCIUM PO Take 1 capsule by mouth daily.   Yes [provider]  Cholecalciferol (VITAMIN D) 2000 units CAPS Take 2,000 Units by mouth daily.   Yes [provider]  ferrous sulfate 325 (65 FE) MG EC tablet Take 325 mg by mouth daily with breakfast.   Yes [provider]  ibuprofen (ADVIL,MOTRIN) 600 MG tablet Take 1 tablet (600 mg total) by mouth every 6 (six) hours as needed. 05/09/16  Yes Joy, Shawn C, PA-C  omeprazole (PRILOSEC) 40 MG capsule Take 1 capsule (40 mg total) by mouth daily. 01/03/12  Yes Burien, Modena Nunnery, MD  topiramate (TOPAMAX) 100 MG tablet Take 100 mg by mouth daily.   Yes [provider]  butalbital-acetaminophen-caffeine (FIORICET, ESGIC) [phone] MG tablet Take 1-2 tablets by mouth every 6 (six) hours as needed for headache. 09/24/16 09/24/17  Dorie Rank, MD  lidocaine (LIDODERM) 5 % Place 1 patch onto the skin daily. Remove & Discard patch within 12 hours or as directed by MD Patient not taking: Reported on 09/24/2016 05/09/16   Joy, Helane Gunther, PA-C  methocarbamol (ROBAXIN) 500 MG tablet Take 1 tablet (500 mg total) by mouth 2 (two) times daily. Patient not taking: Reported on 09/24/2016 05/09/16   Lorayne Bender, PA-C  Family History Family History  Problem Relation Age of Onset  . Diabetes Mother   . Hypertension Mother   . Hypertension Father   . Diabetes Sister   . Hypertension Sister   . Hypertension Brother   . Diabetes Paternal Grandmother   . Hypertension Brother   . Hypertension Brother   . Cancer Maternal Grandmother        breast  . Colon cancer Neg Hx     Social History Social History  Substance Use Topics  . Smoking status: Former Smoker    Packs/day: 0.30    Types: Cigarettes  . Smokeless tobacco:  Never Used     Comment: less than 1/2 pack cigarettes daily  . Alcohol use Yes     Comment: rare     Allergies   Patient has no known allergies.   Review of Systems Review of Systems  All other systems reviewed and are negative.    Physical Exam Updated Vital Signs BP 111/78   Pulse 73   Temp 97.6 F (36.4 C) (Oral)   Resp 14   Wt 96.9 kg (213 lb 10 oz)   SpO2 98%   BMI 39.07 kg/m   Physical Exam  Constitutional: She is oriented to person, place, and time. She appears well-developed and well-nourished. No distress.  HENT:  Head: Normocephalic and atraumatic.  Right Ear: External ear normal.  Left Ear: External ear normal.  Mouth/Throat: Oropharynx is clear and moist.  Eyes: Conjunctivae are normal. Right eye exhibits no discharge. Left eye exhibits no discharge. No scleral icterus.  Neck: Neck supple. No tracheal deviation present.  Cardiovascular: Normal rate, regular rhythm and intact distal pulses.   Pulmonary/Chest: Effort normal and breath sounds normal. No stridor. No respiratory distress. She has no wheezes. She has no rales.  Abdominal: Soft. Bowel sounds are normal. She exhibits no distension. There is no tenderness. There is no rebound and no guarding.  Musculoskeletal: She exhibits no edema or tenderness.  Neurological: She is alert and oriented to person, place, and time. She has normal strength. No cranial nerve deficit (no facial droop, extraocular movements intact, no slurred speech) or sensory deficit. She exhibits normal muscle tone. She displays no seizure activity. Coordination normal.  No pronator drift bilateral upper extrem, able to hold both legs off bed for 5 seconds, sensation intact in all extremities, no visual field cuts, no left or right sided neglect, normal finger-nose exam bilaterally, no nystagmus noted   Skin: Skin is warm and dry. No rash noted.  Psychiatric: She has a normal mood and affect.  Nursing note and vitals  reviewed.    ED Treatments / Results  Labs (all labs ordered are listed, but only abnormal results are displayed) Labs Reviewed  COMPREHENSIVE METABOLIC PANEL - Abnormal; Notable for the following:       Result Value   CO2 20 (*)    Creatinine, Ser 1.32 (*)    Calcium 8.3 (*)    Total Protein 6.2 (*)    GFR calc non Af Amer 48 (*)    GFR calc Af Amer 55 (*)    All other components within normal limits  I-STAT CHEM 8, ED - Abnormal; Notable for the following:    Creatinine, Ser 1.20 (*)    Calcium, Ion 1.08 (*)    TCO2 20 (*)    All other components within normal limits  PROTIME-INR  APTT  CBC  DIFFERENTIAL  I-STAT TROPONIN, ED  CBG MONITORING, ED  EKG  EKG Interpretation  Date/Time:  Monday September 24 2016 11:07:32 EDT Ventricular Rate:  72 PR Interval:    QRS Duration: 95 QT Interval:  408 QTC Calculation: 447 R Axis:   22 Text Interpretation:  Sinus rhythm No significant change since last tracing Confirmed by Dorie Rank [phone]) on 09/24/2016 11:15:44 AM Also confirmed by Dorie Rank [phone]), editor Drema Pry [phone])  on 09/24/2016 12:36:50 PM       Radiology Mr Jodene Nam Head Wo Contrast  Result Date: 09/24/2016 CLINICAL DATA:  Initial evaluation for acute aphasia, body numbness. EXAM: MRI HEAD WITHOUT CONTRAST MRA HEAD WITHOUT CONTRAST MRA NECK WITHOUT AND WITH CONTRAST TECHNIQUE: Multiplanar, multiecho pulse sequences of the brain and surrounding structures were obtained without intravenous contrast. Angiographic images of the Circle of Willis were obtained using MRA technique without intravenous contrast. Angiographic images of the neck were obtained using MRA technique without and with intravenous contrast. Carotid stenosis measurements (when applicable) are obtained utilizing NASCET criteria, using the distal internal carotid diameter as the denominator. CONTRAST:  59mL MULTIHANCE GADOBENATE DIMEGLUMINE 529 MG/ML IV SOLN COMPARISON:  Comparison made with  prior CT of the head performed earlier the same day. FINDINGS: MRI HEAD FINDINGS Cerebral volume within normal limits for age. Few scattered patchy subcentimeter T2/FLAIR hyperintense foci noted within the periventricular and deep white matter both cerebral hemispheres, nonspecific, but felt to be within normal limits for age. No abnormal foci of restricted diffusion to suggest acute or subacute ischemia. Gray-white matter differentiation maintained. No encephalomalacia to suggest chronic infarction. No susceptibility artifact to suggest acute or chronic intracranial hemorrhage. No mass lesion, midline shift or mass effect. Ventricles normal in size without evidence for hydrocephalus. No extra-axial fluid collection. Major dural sinuses grossly patent. Pituitary gland within normal limits. Suprasellar region normal. Midline structures intact and normal. Major intracranial vascular flow voids are maintained. Cerebellar tonsillar ectopia of approximately 4-5 mm without frank Chiari malformation. Secondary mild crowding at the craniocervical junction. Visualized upper cervical spine within normal limits. Bone marrow signal intensity normal. No discrete osseous lesions. No scalp soft tissue abnormality. Globes and orbital soft tissues within normal limits. Patient status post lens extraction on the left. Paranasal sinuses are clear. Trace opacity bilateral mastoid air cells, of doubtful significance. Inner ear structures normal. MRA HEAD FINDINGS ANTERIOR CIRCULATION: Distal cervical segments of the internal carotid arteries are patent with antegrade flow. Petrous, cavernous, and supraclinoid segments widely patent bilaterally without stenosis. ICA termini widely patent. A1 segments, anterior communicating artery common anterior cerebral arteries widely patent to the distal aspects. M1 segments widely patent without stenosis or occlusion. Distal MCA branches well perfused and symmetric. POSTERIOR CIRCULATION: Vertebral  artery's patent to the vertebrobasilar junction without stenosis. Posterior inferior cerebral arteries patent bilaterally. Basilar artery widely patent to its distal aspect. Superior cerebral arteries patent bilaterally. Posterior cerebral artery is well perfused and widely patent to the distal aspects. Small bilateral posterior communicating arteries noted. No aneurysm or vascular malformation. MRA NECK FINDINGS Source images reviewed. Visualized aortic arch of normal caliber with normal branch pattern. No flow-limiting stenosis about the origin of the great vessels. Partially visualized subclavian artery use widely patent without stenosis. Right common and internal carotid arteries widely patent without stenosis or occlusion. No atheromatous stenosis about the right carotid bifurcation. Left common and internal carotid arteries widely patent without stenosis or occlusion. No significant atheromatous narrowing about the left carotid bifurcation. Origin left vertebral artery not well visualized on this exam. Left vertebral artery slightly dominant.  Visualized vertebral artery's widely patent within the neck without stenosis or occlusion. IMPRESSION: 1. Normal brain MRI for age. No acute intracranial infarct or other abnormality identified. 2. Normal intracranial MRA. 3. Normal MRA of the neck. Electronically Signed   By: Jeannine Boga M.D.   On: 09/24/2016 15:40   Mr Jodene Nam Neck W Wo Contrast  Result Date: 09/24/2016 CLINICAL DATA:  Initial evaluation for acute aphasia, body numbness. EXAM: MRI HEAD WITHOUT CONTRAST MRA HEAD WITHOUT CONTRAST MRA NECK WITHOUT AND WITH CONTRAST TECHNIQUE: Multiplanar, multiecho pulse sequences of the brain and surrounding structures were obtained without intravenous contrast. Angiographic images of the Circle of Willis were obtained using MRA technique without intravenous contrast. Angiographic images of the neck were obtained using MRA technique without and with intravenous  contrast. Carotid stenosis measurements (when applicable) are obtained utilizing NASCET criteria, using the distal internal carotid diameter as the denominator. CONTRAST:  42mL MULTIHANCE GADOBENATE DIMEGLUMINE 529 MG/ML IV SOLN COMPARISON:  Comparison made with prior CT of the head performed earlier the same day. FINDINGS: MRI HEAD FINDINGS Cerebral volume within normal limits for age. Few scattered patchy subcentimeter T2/FLAIR hyperintense foci noted within the periventricular and deep white matter both cerebral hemispheres, nonspecific, but felt to be within normal limits for age. No abnormal foci of restricted diffusion to suggest acute or subacute ischemia. Gray-white matter differentiation maintained. No encephalomalacia to suggest chronic infarction. No susceptibility artifact to suggest acute or chronic intracranial hemorrhage. No mass lesion, midline shift or mass effect. Ventricles normal in size without evidence for hydrocephalus. No extra-axial fluid collection. Major dural sinuses grossly patent. Pituitary gland within normal limits. Suprasellar region normal. Midline structures intact and normal. Major intracranial vascular flow voids are maintained. Cerebellar tonsillar ectopia of approximately 4-5 mm without frank Chiari malformation. Secondary mild crowding at the craniocervical junction. Visualized upper cervical spine within normal limits. Bone marrow signal intensity normal. No discrete osseous lesions. No scalp soft tissue abnormality. Globes and orbital soft tissues within normal limits. Patient status post lens extraction on the left. Paranasal sinuses are clear. Trace opacity bilateral mastoid air cells, of doubtful significance. Inner ear structures normal. MRA HEAD FINDINGS ANTERIOR CIRCULATION: Distal cervical segments of the internal carotid arteries are patent with antegrade flow. Petrous, cavernous, and supraclinoid segments widely patent bilaterally without stenosis. ICA termini  widely patent. A1 segments, anterior communicating artery common anterior cerebral arteries widely patent to the distal aspects. M1 segments widely patent without stenosis or occlusion. Distal MCA branches well perfused and symmetric. POSTERIOR CIRCULATION: Vertebral artery's patent to the vertebrobasilar junction without stenosis. Posterior inferior cerebral arteries patent bilaterally. Basilar artery widely patent to its distal aspect. Superior cerebral arteries patent bilaterally. Posterior cerebral artery is well perfused and widely patent to the distal aspects. Small bilateral posterior communicating arteries noted. No aneurysm or vascular malformation. MRA NECK FINDINGS Source images reviewed. Visualized aortic arch of normal caliber with normal branch pattern. No flow-limiting stenosis about the origin of the great vessels. Partially visualized subclavian artery use widely patent without stenosis. Right common and internal carotid arteries widely patent without stenosis or occlusion. No atheromatous stenosis about the right carotid bifurcation. Left common and internal carotid arteries widely patent without stenosis or occlusion. No significant atheromatous narrowing about the left carotid bifurcation. Origin left vertebral artery not well visualized on this exam. Left vertebral artery slightly dominant. Visualized vertebral artery's widely patent within the neck without stenosis or occlusion. IMPRESSION: 1. Normal brain MRI for age. No acute intracranial infarct or other  abnormality identified. 2. Normal intracranial MRA. 3. Normal MRA of the neck. Electronically Signed   By: Jeannine Boga M.D.   On: 09/24/2016 15:40   Mr Brain Wo Contrast  Result Date: 09/24/2016 CLINICAL DATA:  Initial evaluation for acute aphasia, body numbness. EXAM: MRI HEAD WITHOUT CONTRAST MRA HEAD WITHOUT CONTRAST MRA NECK WITHOUT AND WITH CONTRAST TECHNIQUE: Multiplanar, multiecho pulse sequences of the brain and  surrounding structures were obtained without intravenous contrast. Angiographic images of the Circle of Willis were obtained using MRA technique without intravenous contrast. Angiographic images of the neck were obtained using MRA technique without and with intravenous contrast. Carotid stenosis measurements (when applicable) are obtained utilizing NASCET criteria, using the distal internal carotid diameter as the denominator. CONTRAST:  51mL MULTIHANCE GADOBENATE DIMEGLUMINE 529 MG/ML IV SOLN COMPARISON:  Comparison made with prior CT of the head performed earlier the same day. FINDINGS: MRI HEAD FINDINGS Cerebral volume within normal limits for age. Few scattered patchy subcentimeter T2/FLAIR hyperintense foci noted within the periventricular and deep white matter both cerebral hemispheres, nonspecific, but felt to be within normal limits for age. No abnormal foci of restricted diffusion to suggest acute or subacute ischemia. Gray-white matter differentiation maintained. No encephalomalacia to suggest chronic infarction. No susceptibility artifact to suggest acute or chronic intracranial hemorrhage. No mass lesion, midline shift or mass effect. Ventricles normal in size without evidence for hydrocephalus. No extra-axial fluid collection. Major dural sinuses grossly patent. Pituitary gland within normal limits. Suprasellar region normal. Midline structures intact and normal. Major intracranial vascular flow voids are maintained. Cerebellar tonsillar ectopia of approximately 4-5 mm without frank Chiari malformation. Secondary mild crowding at the craniocervical junction. Visualized upper cervical spine within normal limits. Bone marrow signal intensity normal. No discrete osseous lesions. No scalp soft tissue abnormality. Globes and orbital soft tissues within normal limits. Patient status post lens extraction on the left. Paranasal sinuses are clear. Trace opacity bilateral mastoid air cells, of doubtful  significance. Inner ear structures normal. MRA HEAD FINDINGS ANTERIOR CIRCULATION: Distal cervical segments of the internal carotid arteries are patent with antegrade flow. Petrous, cavernous, and supraclinoid segments widely patent bilaterally without stenosis. ICA termini widely patent. A1 segments, anterior communicating artery common anterior cerebral arteries widely patent to the distal aspects. M1 segments widely patent without stenosis or occlusion. Distal MCA branches well perfused and symmetric. POSTERIOR CIRCULATION: Vertebral artery's patent to the vertebrobasilar junction without stenosis. Posterior inferior cerebral arteries patent bilaterally. Basilar artery widely patent to its distal aspect. Superior cerebral arteries patent bilaterally. Posterior cerebral artery is well perfused and widely patent to the distal aspects. Small bilateral posterior communicating arteries noted. No aneurysm or vascular malformation. MRA NECK FINDINGS Source images reviewed. Visualized aortic arch of normal caliber with normal branch pattern. No flow-limiting stenosis about the origin of the great vessels. Partially visualized subclavian artery use widely patent without stenosis. Right common and internal carotid arteries widely patent without stenosis or occlusion. No atheromatous stenosis about the right carotid bifurcation. Left common and internal carotid arteries widely patent without stenosis or occlusion. No significant atheromatous narrowing about the left carotid bifurcation. Origin left vertebral artery not well visualized on this exam. Left vertebral artery slightly dominant. Visualized vertebral artery's widely patent within the neck without stenosis or occlusion. IMPRESSION: 1. Normal brain MRI for age. No acute intracranial infarct or other abnormality identified. 2. Normal intracranial MRA. 3. Normal MRA of the neck. Electronically Signed   By: Jeannine Boga M.D.   On: 09/24/2016 15:40  Ct Head  Code Stroke Wo Contrast  Result Date: 09/24/2016 CLINICAL DATA:  Code stroke.  Aphasia and body numbness. EXAM: CT HEAD WITHOUT CONTRAST TECHNIQUE: Contiguous axial images were obtained from the base of the skull through the vertex without intravenous contrast. COMPARISON:  05/12/2016 FINDINGS: Brain: No evidence of infarction, hemorrhage, hydrocephalus, extra-axial collection or mass lesion/mass effect. Vascular: No hyperdense vessel or unexpected calcification. Skull: Normal. Negative for fracture or focal lesion. Sinuses/Orbits: Negative Other: Text page prelim sent on 09/24/2016 at 10:55 am to Dr. Leonel Ramsay ASPECTS [facility] Stroke Program Early CT Score) -left cerebral hemisphere - Ganglionic level infarction (caudate, lentiform nuclei, internal capsule, insula, M1-M3 cortex): 7 - Supraganglionic infarction (M4-M6 cortex): 3 Total score (0-10 with 10 being normal): 10 IMPRESSION: Negative head CT. ASPECTS is 10. Electronically Signed   By: Monte Fantasia M.D.   On: 09/24/2016 10:56    Procedures Procedures (including critical care time)  Medications Ordered in ED Medications  prochlorperazine (COMPAZINE) injection 10 mg (10 mg Intravenous Given 09/24/16 1202)  diphenhydrAMINE (BENADRYL) injection 12.5 mg (12.5 mg Intravenous Given 09/24/16 1207)  ketorolac (TORADOL) 30 MG/ML injection 30 mg (30 mg Intravenous Given 09/24/16 1207)  gadobenate dimeglumine (MULTIHANCE) injection 20 mL (20 mLs Intravenous Contrast Given 09/24/16 1505)     Initial Impression / Assessment and Plan / ED Course  I have reviewed the triage vital signs and the nursing notes.  Pertinent labs & imaging results that were available during my care of the patient were reviewed by me and considered in my medical decision making (see chart for details).  Clinical Course as of Sep 24 1556  Mon Sep 24, 2016  1101 The patient was seen by Dr. Leonel Ramsay, neurology. Symptoms most likely related to a complex migraine.Code stroke  was canceled. We'll proceed with MRI  [JK]    Clinical Course User Index [JK] Dorie Rank, MD    Patient presented to the emergency room with acute neurologic symptoms. She initially presented as a code stroke but her exam was reassuring and code stroke was canceled. While in the ED she did develop a headache which responded to a migraine cocktail. MRI/MRA ultimately did not show any acute abnormalities. Suspect complex migraine etiology.    Final Clinical Impressions(s) / ED Diagnoses   Final diagnoses:  Migraine with aura and without status migrainosus, not intractable    New Prescriptions New Prescriptions   BUTALBITAL-ACETAMINOPHEN-CAFFEINE (FIORICET, ESGIC) 50-325-40 MG TABLET    Take 1-2 tablets by mouth every 6 (six) hours as needed for headache.     Dorie Rank, MD 09/24/16 1600

## 2016-09-24 NOTE — Consult Note (Signed)
Neurology Consultation Reason for Consult: Difficulty speaking Referring Physician: Hillard Danker  CC: Difficulty speaking  History is obtained from: Patient  HPI: Brooke [name] is a 46 y.o. female who presents with an episode earlier today of difficulty speaking. She was in her normal state of health while she was at work, and then describes that she began feeling "not right" and went to her computer to try and type. She was unable to. She then noticed tingling in her hand which slowly progressed up her side, and then she felt "numb all over" with numbness throughout her entire body.   She denies headache, photophobia, nausea or vomiting. She does have a history of migraine with aura as well as migraine aura without headache. She does complain of some new neck pain.   LKW: 10 AM tpa given?: no, resolving symptoms  ROS: A 14 point ROS was performed and is negative except as noted in the HPI.   Past Medical History:  Diagnosis Date  . Acid reflux   . Anemia   . Anxiety   . Depression   . Migraines      Family History  Problem Relation Age of Onset  . Diabetes Mother   . Hypertension Mother   . Hypertension Father   . Diabetes Sister   . Hypertension Sister   . Hypertension Brother   . Diabetes Paternal Grandmother   . Hypertension Brother   . Hypertension Brother   . Cancer Maternal Grandmother        breast  . Colon cancer Neg Hx      Social History:  reports that she has quit smoking. Her smoking use included Cigarettes. She smoked 0.30 packs per day. She has never used smokeless tobacco. She reports that she drinks alcohol. She reports that she does not use drugs.   Exam: Current vital signs: BP 139/82 (BP Location: Right Arm)   Pulse 67   Temp 97.6 F (36.4 C) (Oral)   Resp 16   Wt 96.9 kg (213 lb 10 oz)   SpO2 100%   BMI 39.07 kg/m  Vital signs in last 24 hours: Temp:  [97.6 F (36.4 C)] 97.6 F (36.4 C) (09/24 1056) Pulse Rate:  [67] 67 (09/24  1056) Resp:  [16] 16 (09/24 1056) BP: (139)/(82) 139/82 (09/24 1056) SpO2:  [100 %] 100 % (09/24 1056) Weight:  [96.9 kg (213 lb 10 oz)] 96.9 kg (213 lb 10 oz) (09/24 1000)   Physical Exam  Constitutional: Appears well-developed and well-nourished.  Psych: Affect appropriate to situation Eyes: No scleral injection HENT: No OP obstrucion Head: Normocephalic.  Cardiovascular: Normal rate and regular rhythm.  Respiratory: Effort normal and breath sounds normal to anterior ascultation GI: Soft.  No distension. There is no tenderness.  Skin: WDI  Neuro: Mental Status: Patient is awake, alert, oriented to person, place, month, year, and situation. Patient is able to give a clear and coherent history. No signs of aphasia or neglect Cranial Nerves: II: Visual Fields are full. Pupils are equal, round, and reactive to light.   III,IV, VI: EOMI without ptosis or diploplia.  V: Facial sensation is symmetric to temperature VII: Facial movement is symmetric.  VIII: hearing is intact to voice X: Uvula elevates symmetrically XI: Shoulder shrug is symmetric. XII: tongue is midline without atrophy or fasciculations.  Motor: Tone is normal. Bulk is normal. 5/5 strength was present in all four extremities.  Sensory: Sensation is symmetric to light touch and temperature in the arms and  legs. Deep Tendon Reflexes: 2+ and symmetric in the biceps and patellae.  Cerebellar: FNF and HKS are intact bilaterally   I have reviewed labs in epic and the results pertinent to this consultation are: Creatinine 1.3  I have reviewed the images obtained: CT head-negative  Impression: 46 year old female with whole-body numbness/difficulty speaking/migrating paresthesia. The semiology sounds much more consistent with migraine aura then stroke or TIA, however I would favor further imaging to rule out vascular causes. With speech changes being the predominant symptom, I don't think that imaging of the neck is  needed, except for the vascular supply.  Recommendations: 1) MRI head, MRA head and neck 2) if the above is negative, I would treat as migraine aura.   Roland Rack, MD [facility] [phone]  If 7pm- 7am, please page neurology on call as listed in [facility].

## 2016-09-24 NOTE — Discharge Instructions (Signed)
Take the medications as needed for pain, follow up with your primary care doctor as we discussed

## 2016-09-24 NOTE — Code Documentation (Signed)
46yo female arriving to [facility] via [place] at 1030.  Patient from work where she reports standing at the counter and feeling full body numbness followed by left hand tingling at 1000.  She proceeded to become nonverbal.  EMS was called and activated a code stroke.  Patient to CT on arrival.  Stroke team to the bedside.  CT completed.  NIHSS 1, see documentation for details and code stroke times.  Patient stated the month was October on exam.  Patient reports h/o migraines.  Patient denies headache but reports neck pain.  No acute stroke treatment at this time d/t symptoms resolving.  Code stroke canceled.  Bedside handoff with ED RN Hassan Rowan.

## 2016-09-24 NOTE — ED Triage Notes (Signed)
Pt here via [facility] for acute onset aphasia while at work.  LSN 1008.  Initial bp was 192/122.  BP 155/95 per [facility].  Pt now ao x 3 (thinks it's October), but otherwise not deficits noted.

## 2016-09-24 NOTE — ED Notes (Signed)
PT now stating pain 6/10 to R neck and R parietal region.  Dr Tomi Bamberger notified.

## 2016-09-25 DIAGNOSIS — R569 Unspecified convulsions: Secondary | ICD-10-CM | POA: Insufficient documentation

## 2016-09-27 DIAGNOSIS — Z6838 Body mass index (BMI) 38.0-38.9, adult: Secondary | ICD-10-CM | POA: Diagnosis not present

## 2016-09-27 DIAGNOSIS — I10 Essential (primary) hypertension: Secondary | ICD-10-CM | POA: Diagnosis not present

## 2016-09-27 DIAGNOSIS — G43119 Migraine with aura, intractable, without status migrainosus: Secondary | ICD-10-CM | POA: Diagnosis not present

## 2016-09-28 DIAGNOSIS — M47816 Spondylosis without myelopathy or radiculopathy, lumbar region: Secondary | ICD-10-CM | POA: Diagnosis not present

## 2016-09-28 DIAGNOSIS — M545 Low back pain: Secondary | ICD-10-CM | POA: Diagnosis not present

## 2016-10-04 DIAGNOSIS — E668 Other obesity: Secondary | ICD-10-CM | POA: Diagnosis not present

## 2016-10-04 DIAGNOSIS — G43909 Migraine, unspecified, not intractable, without status migrainosus: Secondary | ICD-10-CM | POA: Diagnosis not present

## 2016-10-04 DIAGNOSIS — Z23 Encounter for immunization: Secondary | ICD-10-CM | POA: Diagnosis not present

## 2016-10-04 DIAGNOSIS — R079 Chest pain, unspecified: Secondary | ICD-10-CM | POA: Diagnosis not present

## 2016-10-04 DIAGNOSIS — F411 Generalized anxiety disorder: Secondary | ICD-10-CM | POA: Diagnosis not present

## 2016-10-04 DIAGNOSIS — R0789 Other chest pain: Secondary | ICD-10-CM | POA: Diagnosis not present

## 2016-10-04 DIAGNOSIS — I10 Essential (primary) hypertension: Secondary | ICD-10-CM | POA: Diagnosis not present

## 2016-10-04 DIAGNOSIS — Z79899 Other long term (current) drug therapy: Secondary | ICD-10-CM | POA: Diagnosis not present

## 2016-10-04 DIAGNOSIS — E669 Obesity, unspecified: Secondary | ICD-10-CM | POA: Diagnosis not present

## 2016-10-04 DIAGNOSIS — Z6836 Body mass index (BMI) 36.0-36.9, adult: Secondary | ICD-10-CM | POA: Diagnosis not present

## 2016-10-04 DIAGNOSIS — G43009 Migraine without aura, not intractable, without status migrainosus: Secondary | ICD-10-CM | POA: Diagnosis not present

## 2016-10-05 DIAGNOSIS — G43909 Migraine, unspecified, not intractable, without status migrainosus: Secondary | ICD-10-CM | POA: Diagnosis not present

## 2016-10-05 DIAGNOSIS — Z79899 Other long term (current) drug therapy: Secondary | ICD-10-CM | POA: Diagnosis not present

## 2016-10-05 DIAGNOSIS — Z6836 Body mass index (BMI) 36.0-36.9, adult: Secondary | ICD-10-CM | POA: Diagnosis not present

## 2016-10-05 DIAGNOSIS — R0789 Other chest pain: Secondary | ICD-10-CM | POA: Diagnosis not present

## 2016-10-05 DIAGNOSIS — Z23 Encounter for immunization: Secondary | ICD-10-CM | POA: Diagnosis not present

## 2016-10-05 DIAGNOSIS — E668 Other obesity: Secondary | ICD-10-CM | POA: Diagnosis not present

## 2016-10-05 DIAGNOSIS — I10 Essential (primary) hypertension: Secondary | ICD-10-CM | POA: Diagnosis not present

## 2016-10-05 DIAGNOSIS — F411 Generalized anxiety disorder: Secondary | ICD-10-CM | POA: Diagnosis not present

## 2016-10-05 DIAGNOSIS — G43009 Migraine without aura, not intractable, without status migrainosus: Secondary | ICD-10-CM | POA: Diagnosis not present

## 2016-10-05 DIAGNOSIS — E669 Obesity, unspecified: Secondary | ICD-10-CM | POA: Diagnosis not present

## 2016-10-05 DIAGNOSIS — R079 Chest pain, unspecified: Secondary | ICD-10-CM | POA: Diagnosis not present

## 2016-10-08 DIAGNOSIS — G459 Transient cerebral ischemic attack, unspecified: Secondary | ICD-10-CM

## 2016-10-08 DIAGNOSIS — R200 Anesthesia of skin: Secondary | ICD-10-CM

## 2016-10-08 DIAGNOSIS — G43719 Chronic migraine without aura, intractable, without status migrainosus: Secondary | ICD-10-CM | POA: Insufficient documentation

## 2016-10-08 DIAGNOSIS — G458 Other transient cerebral ischemic attacks and related syndromes: Secondary | ICD-10-CM | POA: Diagnosis not present

## 2016-10-08 DIAGNOSIS — G43709 Chronic migraine without aura, not intractable, without status migrainosus: Secondary | ICD-10-CM | POA: Diagnosis not present

## 2016-10-08 DIAGNOSIS — R42 Dizziness and giddiness: Secondary | ICD-10-CM | POA: Insufficient documentation

## 2016-10-08 DIAGNOSIS — R202 Paresthesia of skin: Secondary | ICD-10-CM | POA: Insufficient documentation

## 2016-10-08 DIAGNOSIS — H538 Other visual disturbances: Secondary | ICD-10-CM | POA: Insufficient documentation

## 2016-10-08 HISTORY — DX: Anesthesia of skin: R20.0

## 2016-10-08 HISTORY — DX: Transient cerebral ischemic attack, unspecified: G45.9

## 2016-10-08 HISTORY — DX: Paresthesia of skin: R20.2

## 2016-10-16 DIAGNOSIS — M545 Low back pain: Secondary | ICD-10-CM | POA: Diagnosis not present

## 2016-10-16 DIAGNOSIS — F410 Panic disorder [episodic paroxysmal anxiety] without agoraphobia: Secondary | ICD-10-CM | POA: Diagnosis not present

## 2016-10-16 DIAGNOSIS — M47816 Spondylosis without myelopathy or radiculopathy, lumbar region: Secondary | ICD-10-CM | POA: Diagnosis not present

## 2016-10-16 DIAGNOSIS — Z6839 Body mass index (BMI) 39.0-39.9, adult: Secondary | ICD-10-CM | POA: Diagnosis not present

## 2016-10-17 ENCOUNTER — Ambulatory Visit: Payer: BLUE CROSS/BLUE SHIELD | Admitting: Neurology

## 2016-10-17 DIAGNOSIS — G458 Other transient cerebral ischemic attacks and related syndromes: Secondary | ICD-10-CM | POA: Diagnosis not present

## 2016-10-17 DIAGNOSIS — F17211 Nicotine dependence, cigarettes, in remission: Secondary | ICD-10-CM | POA: Diagnosis not present

## 2016-10-17 DIAGNOSIS — G43909 Migraine, unspecified, not intractable, without status migrainosus: Secondary | ICD-10-CM | POA: Diagnosis not present

## 2016-10-17 DIAGNOSIS — I10 Essential (primary) hypertension: Secondary | ICD-10-CM | POA: Diagnosis not present

## 2016-11-02 DIAGNOSIS — M47816 Spondylosis without myelopathy or radiculopathy, lumbar region: Secondary | ICD-10-CM | POA: Diagnosis not present

## 2016-11-02 DIAGNOSIS — M545 Low back pain: Secondary | ICD-10-CM | POA: Diagnosis not present

## 2016-11-07 DIAGNOSIS — N644 Mastodynia: Secondary | ICD-10-CM | POA: Diagnosis not present

## 2016-11-07 DIAGNOSIS — R4781 Slurred speech: Secondary | ICD-10-CM

## 2016-11-07 HISTORY — DX: Slurred speech: R47.81

## 2016-11-08 ENCOUNTER — Other Ambulatory Visit: Payer: Self-pay | Admitting: Gynecology

## 2016-11-08 DIAGNOSIS — N644 Mastodynia: Secondary | ICD-10-CM

## 2016-11-12 DIAGNOSIS — R002 Palpitations: Secondary | ICD-10-CM | POA: Diagnosis not present

## 2016-11-12 DIAGNOSIS — R079 Chest pain, unspecified: Secondary | ICD-10-CM | POA: Diagnosis not present

## 2016-11-12 DIAGNOSIS — R55 Syncope and collapse: Secondary | ICD-10-CM | POA: Diagnosis not present

## 2016-11-12 DIAGNOSIS — R0602 Shortness of breath: Secondary | ICD-10-CM | POA: Diagnosis not present

## 2016-11-13 ENCOUNTER — Ambulatory Visit
Admission: RE | Admit: 2016-11-13 | Discharge: 2016-11-13 | Disposition: A | Discharge status: Home / Self Care | Payer: BLUE CROSS/BLUE SHIELD | Source: Ambulatory Visit | Attending: Gynecology | Admitting: Gynecology

## 2016-11-13 DIAGNOSIS — N644 Mastodynia: Secondary | ICD-10-CM

## 2016-11-13 DIAGNOSIS — R928 Other abnormal and inconclusive findings on diagnostic imaging of breast: Secondary | ICD-10-CM | POA: Diagnosis not present

## 2016-11-13 DIAGNOSIS — N6012 Diffuse cystic mastopathy of left breast: Secondary | ICD-10-CM | POA: Diagnosis not present

## 2016-11-26 DIAGNOSIS — R079 Chest pain, unspecified: Secondary | ICD-10-CM | POA: Diagnosis not present

## 2016-11-26 DIAGNOSIS — R55 Syncope and collapse: Secondary | ICD-10-CM | POA: Diagnosis not present

## 2016-12-06 ENCOUNTER — Ambulatory Visit: Payer: BLUE CROSS/BLUE SHIELD | Admitting: Neurology

## 2016-12-06 DIAGNOSIS — R079 Chest pain, unspecified: Secondary | ICD-10-CM | POA: Diagnosis not present

## 2016-12-14 DIAGNOSIS — R002 Palpitations: Secondary | ICD-10-CM | POA: Diagnosis not present

## 2016-12-14 DIAGNOSIS — R55 Syncope and collapse: Secondary | ICD-10-CM | POA: Diagnosis not present

## 2017-01-01 DIAGNOSIS — R002 Palpitations: Secondary | ICD-10-CM

## 2017-01-01 DIAGNOSIS — R55 Syncope and collapse: Secondary | ICD-10-CM

## 2017-01-01 HISTORY — DX: Syncope and collapse: R55

## 2017-01-01 HISTORY — DX: Palpitations: R00.2

## 2017-01-11 DIAGNOSIS — R002 Palpitations: Secondary | ICD-10-CM | POA: Diagnosis not present

## 2017-01-11 DIAGNOSIS — R55 Syncope and collapse: Secondary | ICD-10-CM | POA: Diagnosis not present

## 2017-01-23 ENCOUNTER — Ambulatory Visit: Payer: BLUE CROSS/BLUE SHIELD | Admitting: Neurology

## 2017-01-23 ENCOUNTER — Encounter: Payer: Self-pay | Admitting: Neurology

## 2017-01-23 VITALS — BP 137/83 | HR 73 | Ht 62.0 in | Wt 232.0 lb

## 2017-01-23 DIAGNOSIS — G43119 Migraine with aura, intractable, without status migrainosus: Secondary | ICD-10-CM

## 2017-01-23 DIAGNOSIS — Z72820 Sleep deprivation: Secondary | ICD-10-CM | POA: Insufficient documentation

## 2017-01-23 DIAGNOSIS — G4719 Other hypersomnia: Secondary | ICD-10-CM

## 2017-01-23 DIAGNOSIS — R4789 Other speech disturbances: Secondary | ICD-10-CM

## 2017-01-23 DIAGNOSIS — Z6839 Body mass index (BMI) 39.0-39.9, adult: Secondary | ICD-10-CM | POA: Insufficient documentation

## 2017-01-23 DIAGNOSIS — R0981 Nasal congestion: Secondary | ICD-10-CM | POA: Diagnosis not present

## 2017-01-23 DIAGNOSIS — Z6841 Body Mass Index (BMI) 40.0 and over, adult: Secondary | ICD-10-CM

## 2017-01-23 HISTORY — DX: Migraine with aura, intractable, without status migrainosus: G43.119

## 2017-01-23 HISTORY — DX: Other speech disturbances: R47.89

## 2017-01-23 MED ORDER — BUTALBITAL-ASA-CAFF-CODEINE 50-325-40-30 MG PO CAPS: ORAL_CAPSULE | ORAL | 0 refills | Status: DC

## 2017-01-23 NOTE — Addendum Note (Signed)
Addended by: Larey Seat on: 01/23/2017 02:46 PM   Modules accepted: Orders
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
## 2017-01-24 ENCOUNTER — Telehealth: Payer: Self-pay

## 2017-01-24 ENCOUNTER — Other Ambulatory Visit: Payer: Self-pay | Admitting: Neurology

## 2017-01-24 DIAGNOSIS — G4733 Obstructive sleep apnea (adult) (pediatric): Secondary | ICD-10-CM

## 2017-01-24 NOTE — Telephone Encounter (Signed)
Order placed

## 2017-01-24 NOTE — Telephone Encounter (Signed)
BCBS denied in lab study, need HST order. 

## 2017-02-11 ENCOUNTER — Ambulatory Visit (INDEPENDENT_AMBULATORY_CARE_PROVIDER_SITE_OTHER): Payer: BLUE CROSS/BLUE SHIELD | Admitting: Neurology

## 2017-02-11 DIAGNOSIS — Z6841 Body Mass Index (BMI) 40.0 and over, adult: Secondary | ICD-10-CM | POA: Diagnosis not present

## 2017-02-11 DIAGNOSIS — G4733 Obstructive sleep apnea (adult) (pediatric): Secondary | ICD-10-CM

## 2017-02-11 DIAGNOSIS — F410 Panic disorder [episodic paroxysmal anxiety] without agoraphobia: Secondary | ICD-10-CM | POA: Diagnosis not present

## 2017-02-11 DIAGNOSIS — G40804 Other epilepsy, intractable, without status epilepticus: Secondary | ICD-10-CM | POA: Diagnosis not present

## 2017-02-11 DIAGNOSIS — I10 Essential (primary) hypertension: Secondary | ICD-10-CM | POA: Diagnosis not present

## 2017-02-13 ENCOUNTER — Telehealth: Payer: Self-pay | Admitting: Neurology

## 2017-02-13 NOTE — Telephone Encounter (Signed)
Called the patient back and made her aware that Dr Brett Fairy is ok with the keppra being taken. She would like to make sure the eeg is completed I will have someone call and schedule her for this test. Pt verbalized understanding.

## 2017-02-13 NOTE — Telephone Encounter (Signed)
The PCP gave the medication keppra based off of the visit with Dr Brett Fairy and her mention of the possible seizure like activity. The patient wanted to touch base with Dr. Brett Fairy and see if she agrees with this medication being prescribed or if she would rather our workup be complete first. The patient had a HST completed but was not enough data to read so she is scheduled to have a repeat sleep test. There is no order for EEG to be completed on our end. I informed the patient I would pass this information along to Dr. Brett Fairy and see what she thinks and will give her a call back. Pt verbalized understanding.

## 2017-02-13 NOTE — Telephone Encounter (Signed)
Patient saw her PCP this past Monday and he prescribed Levetiracetam for seizures. She would like to discuss before taking this medication.

## 2017-02-28 ENCOUNTER — Other Ambulatory Visit: Payer: BLUE CROSS/BLUE SHIELD

## 2017-02-28 ENCOUNTER — Telehealth: Payer: Self-pay | Admitting: Neurology

## 2017-02-28 ENCOUNTER — Other Ambulatory Visit: Payer: Self-pay | Admitting: Neurology

## 2017-02-28 DIAGNOSIS — R0683 Snoring: Secondary | ICD-10-CM

## 2017-02-28 DIAGNOSIS — G4719 Other hypersomnia: Secondary | ICD-10-CM
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
## 2017-02-28 NOTE — Progress Notes (Unsigned)
patient cannot undergo MSLT on prozac and wellbutrin , but I would like to offer HLA narcolepsy panel testing. Larey Seat, MD

## 2017-02-28 NOTE — Telephone Encounter (Signed)
Called the pt and went over the sleep study with her. I informed her that it was negative for sleep apnea. I informed her Dr Brett Fairy would like to have her come in for lab work and I went over the hours lab was open in our office. She has a upcoming apt on 3/11 and I have informed her it would be beneficial if she had the blood work drawn before her apt. Pt verbalized understanding. Pt had no questions at this time but was encouraged to call back if questions arise.

## 2017-02-28 NOTE — Telephone Encounter (Signed)
-----  Message from Larey Seat, MD sent at 02/28/2017  2:00 PM EST ----- Dr [name], MD is PCP-  Mild sleep apnea, too little to require CPAP intervention, associated with snoring but not abnormal heart rate or hypoxemia.  The patient's headches are not explained , neither the Epworth score of 15 points.  I ike to pursue HLA testing for narcolepsy, please have patient come by within the next 30 days. CD

## 2017-03-06 DIAGNOSIS — M5412 Radiculopathy, cervical region: Secondary | ICD-10-CM | POA: Diagnosis not present

## 2017-03-11 ENCOUNTER — Ambulatory Visit: Payer: BLUE CROSS/BLUE SHIELD

## 2017-03-11 ENCOUNTER — Other Ambulatory Visit (INDEPENDENT_AMBULATORY_CARE_PROVIDER_SITE_OTHER): Payer: BLUE CROSS/BLUE SHIELD

## 2017-03-11 DIAGNOSIS — R4701 Aphasia: Secondary | ICD-10-CM

## 2017-03-11 DIAGNOSIS — R0981 Nasal congestion: Secondary | ICD-10-CM

## 2017-03-11 DIAGNOSIS — Z6841 Body Mass Index (BMI) 40.0 and over, adult: Secondary | ICD-10-CM

## 2017-03-11 DIAGNOSIS — R0683 Snoring: Secondary | ICD-10-CM | POA: Diagnosis not present

## 2017-03-11 DIAGNOSIS — R4789 Other speech disturbances: Secondary | ICD-10-CM

## 2017-03-11 DIAGNOSIS — G4719 Other hypersomnia: Secondary | ICD-10-CM | POA: Diagnosis not present

## 2017-03-11 DIAGNOSIS — Z0289 Encounter for other administrative examinations: Secondary | ICD-10-CM

## 2017-03-11 DIAGNOSIS — Z72820 Sleep deprivation: Secondary | ICD-10-CM

## 2017-03-11 DIAGNOSIS — G43119 Migraine with aura, intractable, without status migrainosus: Secondary | ICD-10-CM

## 2017-03-15 DIAGNOSIS — M542 Cervicalgia: Secondary | ICD-10-CM | POA: Diagnosis not present

## 2017-03-15 NOTE — Procedures (Signed)
   [facility]  EEG (ELECTROENCEPHALOGRAM) REPORT   STUDY DATE: 03/11/17 PATIENT NAME: Brooke [name] DOB: 1970-03-21 MRN: [id number]  ORDERING CLINICIAN: Larey Seat, MD   TECHNOLOGIST: Oneita Jolly  TECHNIQUE: Electroencephalogram was recorded utilizing standard 10-20 system of lead placement and reformatted into average and bipolar montages.  RECORDING TIME: 20 minutes ACTIVATION: hyperventilation and photic stimulation  CLINICAL INFORMATION: 47 year old female with abnormal spells of aphasia and numbness.  FINDINGS: Posterior dominant background rhythms, which attenuate with eye opening, ranging 9-10 hertz and 30-40 microvolts. Intermixed muscle artifact noted. No focal, lateralizing, epileptiform activity or seizures are seen. Patient recorded in the awake and drowsy state. EKG channel shows regular rhythm of 55-65 beats per minute.   IMPRESSION:   Normal EEG in the awake and drowsy states.    INTERPRETING PHYSICIAN:  Penni Bombard, MD Certified in Neurology, Neurophysiology and Neuroimaging  [facility] [address] [place], [place] [address] [phone]

## 2017-03-18 ENCOUNTER — Telehealth: Payer: Self-pay | Admitting: Neurology

## 2017-03-18 LAB — NARCOLEPSY EVALUATION
DQB1*06:02: NEGATIVE
HLA-DQ ALPHA: POSITIVE

## 2017-03-18 NOTE — Telephone Encounter (Signed)
-----   Message from Larey Seat, MD sent at 03/18/2017 11:24 AM EDT ----- Normal EEG

## 2017-03-18 NOTE — Telephone Encounter (Signed)
Called the pt to make her aware that her EEG was normal. Pt verbalized understanding. Pt had no questions at this time but was encouraged to call back if questions arise.

## 2017-03-20 ENCOUNTER — Other Ambulatory Visit: Payer: Self-pay | Admitting: Neurology

## 2017-03-20 ENCOUNTER — Telehealth: Payer: Self-pay | Admitting: Neurology

## 2017-03-20 DIAGNOSIS — G4719 Other hypersomnia: Secondary | ICD-10-CM

## 2017-03-20 NOTE — Telephone Encounter (Signed)
Called the patient and made her aware of the lab work and the positive marker for narcolepsy. In order to truly diagnose this we would have to complete a MSLT sleep study. I have explained this process to the patient and she agrees to do so. I looked over the medication list and the patient no longer takes prozac. She has been off of that for 2 wks. The patient still takes buproprion I informed her that in order to have a valid MSLT test we would need for her to titrate off of that medication and be off the med completley for a minimum of 14 days. Pt verbalized understanding and will talk with her PCP about how to wean off the medication. I informed her our sleep lab would contact her to get her set up. Pt verbalized understanding.

## 2017-03-20 NOTE — Telephone Encounter (Signed)
-----  Message from Larey Seat, MD sent at 03/18/2017  5:14 PM EDT ----- One HLA marker is positive- this means that her is a immuno-mediated higher chance/ risk for thi patient to develop narcolepsy.  The gold standard is still a PSG with MSLT. CD  Cc to PCP and referring physician Dr. [name]

## 2017-03-22 DIAGNOSIS — M5412 Radiculopathy, cervical region: Secondary | ICD-10-CM | POA: Diagnosis not present

## 2017-03-22 DIAGNOSIS — M542 Cervicalgia: Secondary | ICD-10-CM | POA: Diagnosis not present

## 2017-03-25 DIAGNOSIS — M542 Cervicalgia: Secondary | ICD-10-CM | POA: Diagnosis not present

## 2017-03-25 DIAGNOSIS — M5412 Radiculopathy, cervical region: Secondary | ICD-10-CM | POA: Diagnosis not present

## 2017-03-25 DIAGNOSIS — M47812 Spondylosis without myelopathy or radiculopathy, cervical region: Secondary | ICD-10-CM | POA: Diagnosis not present

## 2017-04-05 DIAGNOSIS — M542 Cervicalgia: Secondary | ICD-10-CM | POA: Diagnosis not present

## 2017-04-05 DIAGNOSIS — M5412 Radiculopathy, cervical region: Secondary | ICD-10-CM | POA: Diagnosis not present

## 2017-04-05 DIAGNOSIS — M47812 Spondylosis without myelopathy or radiculopathy, cervical region: Secondary | ICD-10-CM | POA: Diagnosis not present

## 2017-04-16 DIAGNOSIS — I10 Essential (primary) hypertension: Secondary | ICD-10-CM | POA: Diagnosis not present

## 2017-04-16 DIAGNOSIS — Z6841 Body Mass Index (BMI) 40.0 and over, adult: Secondary | ICD-10-CM | POA: Diagnosis not present

## 2017-04-16 DIAGNOSIS — F410 Panic disorder [episodic paroxysmal anxiety] without agoraphobia: Secondary | ICD-10-CM | POA: Diagnosis not present

## 2017-04-16 DIAGNOSIS — G40804 Other epilepsy, intractable, without status epilepticus: Secondary | ICD-10-CM | POA: Diagnosis not present

## 2017-04-16 DIAGNOSIS — I872 Venous insufficiency (chronic) (peripheral): Secondary | ICD-10-CM | POA: Diagnosis not present

## 2017-04-16 DIAGNOSIS — E668 Other obesity: Secondary | ICD-10-CM | POA: Diagnosis not present

## 2017-04-18 DIAGNOSIS — M542 Cervicalgia: Secondary | ICD-10-CM | POA: Diagnosis not present

## 2017-04-18 DIAGNOSIS — M5412 Radiculopathy, cervical region: Secondary | ICD-10-CM | POA: Diagnosis not present

## 2017-04-18 DIAGNOSIS — M47812 Spondylosis without myelopathy or radiculopathy, cervical region: Secondary | ICD-10-CM | POA: Diagnosis not present

## 2017-04-18 DIAGNOSIS — M25512 Pain in left shoulder: Secondary | ICD-10-CM | POA: Diagnosis not present

## 2017-05-01 ENCOUNTER — Ambulatory Visit (INDEPENDENT_AMBULATORY_CARE_PROVIDER_SITE_OTHER): Payer: BLUE CROSS/BLUE SHIELD | Admitting: Neurology

## 2017-05-01 DIAGNOSIS — M25512 Pain in left shoulder: Secondary | ICD-10-CM | POA: Diagnosis not present

## 2017-05-01 DIAGNOSIS — G4710 Hypersomnia, unspecified: Secondary | ICD-10-CM

## 2017-05-01 DIAGNOSIS — G4719 Other hypersomnia: Secondary | ICD-10-CM

## 2017-05-02 ENCOUNTER — Ambulatory Visit (INDEPENDENT_AMBULATORY_CARE_PROVIDER_SITE_OTHER): Payer: BLUE CROSS/BLUE SHIELD | Admitting: Neurology

## 2017-05-02 ENCOUNTER — Other Ambulatory Visit: Payer: Self-pay | Admitting: Neurology

## 2017-05-02 DIAGNOSIS — G4752 REM sleep behavior disorder: Secondary | ICD-10-CM

## 2017-05-02 DIAGNOSIS — G4719 Other hypersomnia: Secondary | ICD-10-CM

## 2017-05-02 NOTE — Addendum Note (Signed)
Addended by: Inis Sizer D on: 05/02/2017 03:25 PM   Modules accepted: Orders

## 2017-05-07 DIAGNOSIS — M47816 Spondylosis without myelopathy or radiculopathy, lumbar region: Secondary | ICD-10-CM | POA: Diagnosis not present

## 2017-05-07 DIAGNOSIS — M25552 Pain in left hip: Secondary | ICD-10-CM | POA: Diagnosis not present

## 2017-05-07 LAB — COMPREHENSIVE DRUG ANALYSIS,UR

## 2017-05-09 ENCOUNTER — Telehealth: Payer: Self-pay | Admitting: Neurology

## 2017-05-09 NOTE — Telephone Encounter (Signed)
I will call the pt as soon as I have received the results. Have informed Dr Brett Fairy that pt is calling inquiring about the results.

## 2017-05-09 NOTE — Procedures (Signed)
  Name:  Eveleigh, Crumpler Reference [id number]  Study Date: 05/02/2017 Procedure #: 2012  DOB: 1970/04/09    Protocol  This is a 13 channel Multiple Sleep Latency Test comprised of 5 channels of EEG (T3-Cz, Cz-T4, F4-M1, C4-M1, O2-M1), 3 channels of Chin EMG, 4 channels of EOG and 1 channel for ECG.   All channels were sampled at 256hz .    This polysomnographic procedure is designed to evaluate (1) the complaint of excessive daytime sleepiness by quantifying the time required to fall asleep and (2) the possibility of narcolepsy by checking for abnormally short latencies to REM sleep.  Electrographic variables include EEG, EMG, EOG and ECG.  Patients are monitored throughout four or five 20-minute opportunities to sleep (naps) at two-hour intervals.  For each nap, the patient is allowed 20 minutes to fall asleep.  Once asleep, the patient is awakened after 15 minutes.  Between naps, the patient is kept as alert as possible.  A sleep latency of 20 minutes indicates that no sleep occurred.  Parametric Analysis  Total Number of Naps 5     NAP # Time of Nap  Sleep Latency (mins) REM Latency (mins) Sleep Time Percent Awake Time Percent  1 06:56 4 0 79 21   2 9:00 10 0    3 10:57 1 0    4 12:58 4 0    5 14:54 6 0     MSLT Summary of Naps     Mean Sleep Latency to all Five Naps: 5.0  Number of Naps with REM Sleep: 0    Results from Preceding PSG Study  Sleep Onset Time 21:14 Sleep Efficiency (%) 86  Rise Time 05:52 Sleep Latency (min) 33.5  Total Sleep Time  417.5 REM Latency (min) 80.5     IMPRESSION:  1. This multiple sleep latency test reveals a mean sleep latency of 5.0 minutes without any REM sleep being recorded.  A total of 5 sleep periods were recorded in 5 nap opportunities.   2. This study was preceded by an overnight polysomnogram with a total sleep time (TST) of 417.5 minutes.       Name:  Tiffny, Gemmer Reference #:  [id number]  Study Date: 05/02/2017 DOB: 1970/07/12   I  attest to having reviewed every epoch of the entire raw data recording prior to the issuance of this report in accordance with the Standards of the [place] of Sleep Medicine.     RECOMMENDATIONS:  This MSLT study is abnormal due to short mean sleep latency. This study is consistent with severe hypersomnolence. This study was not consistent with a diagnosis of narcolepsy.     Larey Seat, M.D.      05-09-2017  Diplomat, American Board of Psychiatry and Neurology  Diplomat, [place] of Sleep Medicine Medical Director, [place] [facility] at [facility]

## 2017-05-09 NOTE — Telephone Encounter (Signed)
Pt request sleep study. She is aware Dr D has been out of the office and it may not have been read yet. She was appreciative

## 2017-05-09 NOTE — Procedures (Signed)
PATIENT'S NAME:  Brooke [name], Brooke [name] DOB:      May 25, 1970      MR#:    [id number]     DATE OF RECORDING: 05/01/2017 REFERRING M.D.:  Velvet Bathe A. [name], MD Study Performed:   Baseline Polysomnogram for MSLT to follow.  HISTORY: HST was performed on watch pat on 02-25-2017 and documented an AHI of 6.4/h mild apnea, moderate snoring ,without hypoxemia and with NSR.  Unable to explain EDS.  Patient presented with spells, complex migraines, morbid obesity, toxoplasmosis eye disease, and Insomnia. The patient reported that spells were unrelated to headaches, left her numb, apparently unresponsive and unable to communicate verbally -while she was aware of her surroundings. Vivid dreams were reported.  She endorsed further excessive daytime sleepiness with an Epworth score of 15/ 24 points, FSS at 48, PHQ depression score at 3/ 12 points. The patient's weight 232 pounds with a height of 62 (inches), resulting in a BMI of 42.6 kg/m2.The patient's neck circumference measured 14.8 inches.  CURRENT MEDICATIONS:  the patient weaned off Wellbutrin, Prozac, but took Calcium Po, Vitamin D, 65FE, Advil, Toprol XL, Prilosec, Topamax.   PROCEDURE:  This is a multichannel digital polysomnogram utilizing the Somnostar 11.2 system.  Electrodes and sensors were applied and monitored per AASM Specifications.   EEG, EOG, Chin and Limb EMG, were sampled at 200 Hz.  ECG, Snore and Nasal Pressure, Thermal Airflow, Respiratory Effort, CPAP Flow and Pressure, Oximetry was sampled at 50 Hz. Digital video and audio were recorded.      BASELINE STUDY Lights Out was at 21:14 and Lights On at 05:19.  Total recording time (TRT) was 485.5 minutes, with a total sleep time (TST) of 417.5 minutes.   The patient's sleep latency was 36 minutes.  REM latency was 80.5 minutes.  The sleep efficiency was 86.9 %.     SLEEP ARCHITECTURE: WASO (Wake after sleep onset) was 27.5 minutes.  There were 5 minutes in Stage N1, 259 minutes Stage N2, 82 minutes Stage  N3 and 71.5 minutes in Stage REM.  The percentage of Stage N1 was 1.2%, Stage N2 was 62.%, Stage N3 was 19.6% and Stage R (REM sleep) was 17.1%.   RESPIRATORY ANALYSIS:  There were a total of 29 respiratory events:  5 obstructive apneas, 0 central apneas and 2 mixed apneas with a total of 7 apneas and an apnea index (AI) of 1. /hour. There were 22 hypopneas with a hypopnea index of 3.2 /hour. The patient also had 0 respiratory event related arousals (RERAs).The total APNEA/HYPOPNEA INDEX (AHI) was 4.2/hour and the total RESPIRATORY DISTURBANCE INDEX was 4.2 /hour.  17 events occurred in REM sleep and 15 events in NREM. The REM AHI was 14.3 /hour, versus a non-REM AHI of 2.1/h. The patient spent 105.5 minutes of total sleep time in the supine position and 312 minutes in non-supine. The supine AHI was 6.9 versus a non-supine AHI of 3.3.  OXYGEN SATURATION & C02:  The Wake baseline 02 saturation was 95%, with the lowest being 88%. Time spent below 89% saturation equaled 0 minutes. Average End Tidal CO2 during sleep was not tested.    PERIODIC LIMB MOVEMENTS:  The patient had a total of 98 Periodic Limb Movements.  The Periodic Limb Movement (PLM) index was 14.1 and the PLM Arousal index was 4.7/hour. The arousals were noted as: 44 were spontaneous, 33 were associated with PLMs, and only 2 were associated with respiratory events.  Audio and video analysis did not show any abnormal  or unusual movements, behaviors, phonations or vocalizations.   The patient took bathroom breaks. EKG was in keeping with normal sinus rhythm (NSR). Post-study, the patient indicated that sleep was the same as usual.   IMPRESSION: 1. Mild Obstructive Sleep Apnea at AHI 4.2 /h - not enough to need intervention (OSA). 2. Moderate Severe Periodic Limb Movement Disorder (PLMD) 3. Normal REM latency.   RECOMMENDATIONS:  1. Patient stayed for MSLT to address possible sleep paralysis/ narcolepsy.  I certify that I have reviewed  the entire raw data recording prior to the issuance of this report in accordance with the Standards of Accreditation of the American Academy of Sleep Medicine (AASM)      Larey Seat, MD   05-09-2017  Diplomat, American Board of Psychiatry and Neurology  Diplomat, American Board of [place] Director, [facility] [employer]

## 2017-05-13 ENCOUNTER — Telehealth: Payer: Self-pay | Admitting: Neurology

## 2017-05-13 NOTE — Telephone Encounter (Signed)
Called the pt and reviewed her sleep test and MSLT test. I informed the patient the night time study noted only mild apnea, not requiring cpap intervention, as well and mod to severe PLM's. Informed her the daytime showed where she went to sleep in all 5 test but never went into REM sleep therefore that meant she did not have narcolepsy, I informed her that Dr Brett Fairy is recommending that we bring her in to treat the PLM which will hopefully help her quality of sleep and make her feel better in the daytime. Pt verbalized understanding. I have set an apt on 06/12/17 at 8:30 am with check in of 8 am.

## 2017-05-13 NOTE — Telephone Encounter (Signed)
-----   Message from Brooke Seat, MD sent at 05/09/2017  6:57 PM EDT ----- There is cleasrly documented sleepiness but no REM onset, therfor not narcolepsy -  Dx is hypersomnia, idiopathic.   Cc Dr. Angelique Holm     I recommend to treat the PLMs found during her nocturnal and attended PSG as  this may help improve sleep quality.  No other organic reason for EDS .

## 2017-05-20 ENCOUNTER — Encounter (HOSPITAL_COMMUNITY): Payer: Self-pay

## 2017-05-20 ENCOUNTER — Telehealth (HOSPITAL_COMMUNITY): Payer: Self-pay | Admitting: Physical Therapy

## 2017-05-20 ENCOUNTER — Ambulatory Visit (HOSPITAL_COMMUNITY): Payer: BLUE CROSS/BLUE SHIELD

## 2017-05-20 NOTE — Telephone Encounter (Signed)
Patient cancel for personal reasons

## 2017-05-21 DIAGNOSIS — M47812 Spondylosis without myelopathy or radiculopathy, cervical region: Secondary | ICD-10-CM | POA: Diagnosis not present

## 2017-05-21 DIAGNOSIS — M542 Cervicalgia: Secondary | ICD-10-CM | POA: Diagnosis not present

## 2017-05-22 ENCOUNTER — Ambulatory Visit (HOSPITAL_COMMUNITY): Payer: BLUE CROSS/BLUE SHIELD | Attending: Sports Medicine | Admitting: Physical Therapy

## 2017-05-22 ENCOUNTER — Encounter (HOSPITAL_COMMUNITY): Payer: Self-pay

## 2017-05-24 ENCOUNTER — Other Ambulatory Visit: Payer: Self-pay | Admitting: Surgery

## 2017-05-28 DIAGNOSIS — F509 Eating disorder, unspecified: Secondary | ICD-10-CM | POA: Diagnosis not present

## 2017-05-28 DIAGNOSIS — Z1321 Encounter for screening for nutritional disorder: Secondary | ICD-10-CM | POA: Diagnosis not present

## 2017-05-28 DIAGNOSIS — K912 Postsurgical malabsorption, not elsewhere classified: Secondary | ICD-10-CM | POA: Diagnosis not present

## 2017-05-28 DIAGNOSIS — Z9884 Bariatric surgery status: Secondary | ICD-10-CM | POA: Diagnosis not present

## 2017-05-29 ENCOUNTER — Encounter: Payer: BLUE CROSS/BLUE SHIELD | Attending: Surgery | Admitting: Registered"

## 2017-05-29 ENCOUNTER — Encounter: Payer: Self-pay | Admitting: Registered"

## 2017-05-29 DIAGNOSIS — Z713 Dietary counseling and surveillance: Secondary | ICD-10-CM | POA: Diagnosis not present

## 2017-05-29 DIAGNOSIS — G40909 Epilepsy, unspecified, not intractable, without status epilepticus: Secondary | ICD-10-CM | POA: Diagnosis not present

## 2017-05-29 DIAGNOSIS — Z6841 Body Mass Index (BMI) 40.0 and over, adult: Secondary | ICD-10-CM | POA: Diagnosis not present

## 2017-05-29 DIAGNOSIS — F329 Major depressive disorder, single episode, unspecified: Secondary | ICD-10-CM | POA: Diagnosis not present

## 2017-05-29 DIAGNOSIS — I10 Essential (primary) hypertension: Secondary | ICD-10-CM | POA: Insufficient documentation

## 2017-05-29 DIAGNOSIS — Z79899 Other long term (current) drug therapy: Secondary | ICD-10-CM | POA: Insufficient documentation

## 2017-05-29 DIAGNOSIS — K219 Gastro-esophageal reflux disease without esophagitis: Secondary | ICD-10-CM | POA: Insufficient documentation

## 2017-05-29 DIAGNOSIS — Z8541 Personal history of malignant neoplasm of cervix uteri: Secondary | ICD-10-CM | POA: Insufficient documentation

## 2017-05-29 DIAGNOSIS — G43909 Migraine, unspecified, not intractable, without status migrainosus: Secondary | ICD-10-CM | POA: Diagnosis not present

## 2017-05-29 DIAGNOSIS — E669 Obesity, unspecified: Secondary | ICD-10-CM

## 2017-05-29 NOTE — Progress Notes (Signed)
Pre-Op Assessment Visit:  Pre-Operative RYGB Surgery  Medical Nutrition Therapy:  Appt start time: 8:10  End time:  9:05  Patient was seen on 05/29/2017 for Pre-Operative Nutrition Assessment. Assessment and letter of approval faxed to [facility] Bariatric Surgery Program coordinator on 05/29/2017.   Pt expectation of surgery: reduce medications, get healthier, wants to be able to be able to exercise, and increase energy  Pt expectation of Dietitian: help along the way  Start weight at [facility]: 238.2 BMI: 42.20   Pt states she does not have the motivation to exercise and wants to increase energy to work out. Pt states she has 2 sons, currently raising her 47 year old cousin and also has new grandchild.   Per insurance, pt needs 0 SWL visits prior to surgery.    24 hr Dietary Recall: First Meal: sometimes skips  Snack: none Second Meal: sometimes skips; 2 chicken legs, okra, pinto beans, cornbread Snack: cake Third Meal: typically skips; green beans, corn Snack: ice cream Beverages: sweet tea, Egbert Garibaldi, water  Encouraged to engage in 75 minutes of moderate physical activity including cardiovascular and weight baring weekly  Handouts given during visit include:  . Pre-Op Goals . Bariatric Surgery Protein Shakes . Vitamin and Mineral Recommendations  During the appointment today the following Pre-Op Goals were reviewed with the patient: . Track your food and beverage: MyFitness Pal or Baritastic App . Make healthy food choices . Begin to limit portion sizes . Limited concentrated sugars and fried foods . Keep fat/sugar in the single digits per serving on         food labels . Practice CHEWING your food  (aim for 30 chews per bite or until applesauce consistency) . Practice not drinking 15 minutes before, during, and 30 minutes after each meal/snack . Avoid all carbonated beverages  . Avoid/limit caffeinated beverages  . Avoid all sugar-sweetened  beverages . Avoid alcohol . Consume 3 meals per day; eat every 3-5 hours . Make a list of non-food related activities . Aim for 64-100 ounces of FLUID daily  . Aim for at least 60-80 grams of PROTEIN daily . Look for a liquid protein source that contain ?15 g protein and ?5 g carbohydrate  (ex: shakes, drinks, shots) . Physical activity is an important part of a healthy lifestyle so keep it moving!  Follow diet recommendations listed below Energy and Macronutrient Recommendations: Calories: 1600 Carbohydrate: 180 Protein: 120 Fat: 44  Demonstrated degree of understanding via:  Teach Back   Teaching Method Utilized:  Visual Auditory Hands on  Barriers to learning/adherence to lifestyle change: none identified  Patient to call the Nutrition and Diabetes Education Services to enroll in Pre-Op and Post-Op Nutrition Education when surgery date is scheduled.

## 2017-06-05 ENCOUNTER — Ambulatory Visit
Admission: RE | Admit: 2017-06-05 | Discharge: 2017-06-05 | Disposition: A | Discharge status: Home / Self Care | Payer: BLUE CROSS/BLUE SHIELD | Source: Ambulatory Visit | Attending: Surgery | Admitting: Surgery

## 2017-06-05 DIAGNOSIS — K219 Gastro-esophageal reflux disease without esophagitis: Secondary | ICD-10-CM | POA: Insufficient documentation

## 2017-06-05 DIAGNOSIS — K449 Diaphragmatic hernia without obstruction or gangrene: Secondary | ICD-10-CM | POA: Diagnosis not present

## 2017-06-05 DIAGNOSIS — Z136 Encounter for screening for cardiovascular disorders: Secondary | ICD-10-CM | POA: Diagnosis not present

## 2017-06-05 DIAGNOSIS — Z01818 Encounter for other preprocedural examination: Secondary | ICD-10-CM | POA: Diagnosis not present

## 2017-06-06 DIAGNOSIS — M542 Cervicalgia: Secondary | ICD-10-CM | POA: Diagnosis not present

## 2017-06-06 DIAGNOSIS — M47812 Spondylosis without myelopathy or radiculopathy, cervical region: Secondary | ICD-10-CM | POA: Diagnosis not present

## 2017-06-12 ENCOUNTER — Telehealth: Payer: Self-pay | Admitting: Neurology

## 2017-06-12 ENCOUNTER — Ambulatory Visit: Payer: BLUE CROSS/BLUE SHIELD | Admitting: Neurology

## 2017-06-12 ENCOUNTER — Encounter: Payer: Self-pay | Admitting: Neurology

## 2017-06-12 VITALS — BP 115/80 | HR 60 | Ht 62.0 in | Wt 241.0 lb

## 2017-06-12 DIAGNOSIS — G4714 Hypersomnia due to medical condition: Secondary | ICD-10-CM | POA: Insufficient documentation

## 2017-06-12 HISTORY — DX: Hypersomnia due to medical condition: G47.14

## 2017-06-12 MED ORDER — MODAFINIL 200 MG PO TABS: 200.0000 mg | ORAL_TABLET | Freq: Every day | ORAL | 1 refills | Status: DC
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
## 2017-06-12 NOTE — Patient Instructions (Signed)
Hypersomnia Hypersomnia is when you feel extremely tired during the day even though you're getting plenty of sleep at night. You may need to take naps during the day, and you may also be extremely difficult to wake up when you are sleeping. What are the causes? The cause of your hypersomnia may not be known. Hypersomnia may be caused by:  Medicines.  Sleep disorders, such as narcolepsy.  Trauma or injury to your head or nervous system.  Using drugs or alcohol.  Tumors.  Medical conditions, such as depression or hypothyroidism.  Genetics.  What are the signs or symptoms? The main symptoms of hypersomnia include:  Feeling extremely tired throughout the day.  Being very difficult to wake up.  Sleeping for longer and longer periods.  Taking naps throughout the day.  Other symptoms may include:  Feeling: ? Restless. ? Annoyed. ? Anxious. ? Low energy.  Having difficulty: ? Remembering. ? Speaking. ? Thinking.  Losing your appetite.  Experiencing hallucinations.  How is this diagnosed? Hypersomnia may be diagnosed by:  Medical history and physical exam. This will include a sleep history.  Completing sleep logs.  Tests may also be done, such as: ? Polysomnography. ? Multiple sleep latency test (MSLT).  How is this treated? There is no cure for hypersomnia, but treatment can be very effective in helping manage the condition. Treatment may include:  Lifestyle and sleeping strategies to help cope with the condition.  Stimulant medicines.  Treating any underlying causes of hypersomnia.  Follow these instructions at home:  Take medicines only as directed by your health care provider.  Schedule short naps for when you feel sleepiest during the day. Tell your employer or teachers that you have hypersomnia. You may be able to adjust your schedule to include time for naps.  Avoid drinking alcohol or caffeinated beverages.  Do not eat a heavy meal before  bedtime. Eat at about the same times every day.  Do not drive or operate heavy machinery if you are sleepy.  Do not swim or go out on the water without a life jacket.  If possible, adjust your schedule so that you do not have to work or be active at night.  Keep all follow-up visits as directed by your health care provider. This is important. Contact a health care provider if:  You have new symptoms.  Your symptoms get worse. Get help right away if: You have serious thoughts of hurting yourself or someone else. This information is not intended to replace advice given to you by your health care provider. Make sure you discuss any questions you have with your health care provider. Document Released: 12/08/2001 Document Revised: 05/26/2015 Document Reviewed: 07/23/2013 Elsevier Interactive Patient Education  2018 Elsevier Inc.  

## 2017-06-12 NOTE — Telephone Encounter (Signed)
PA completed through cover my meds through [employer]. [id number]. Can take up to 3 days before hearing back.

## 2017-06-13 ENCOUNTER — Other Ambulatory Visit: Payer: Self-pay | Admitting: Neurology

## 2017-06-13 MED ORDER — ARMODAFINIL 200 MG PO TABS: 200.0000 mg | ORAL_TABLET | ORAL | 5 refills | Status: DC

## 2017-06-13 NOTE — Telephone Encounter (Signed)
Insurance preferred [email or web address]. Will place this order and dc this medication per Dr Brett Fairy.

## 2017-06-17 ENCOUNTER — Encounter: Payer: BLUE CROSS/BLUE SHIELD | Attending: Surgery | Admitting: Registered"

## 2017-06-17 DIAGNOSIS — F329 Major depressive disorder, single episode, unspecified: Secondary | ICD-10-CM | POA: Insufficient documentation

## 2017-06-17 DIAGNOSIS — Z79899 Other long term (current) drug therapy: Secondary | ICD-10-CM | POA: Diagnosis not present

## 2017-06-17 DIAGNOSIS — K219 Gastro-esophageal reflux disease without esophagitis: Secondary | ICD-10-CM | POA: Diagnosis not present

## 2017-06-17 DIAGNOSIS — I10 Essential (primary) hypertension: Secondary | ICD-10-CM | POA: Insufficient documentation

## 2017-06-17 DIAGNOSIS — Z713 Dietary counseling and surveillance: Secondary | ICD-10-CM | POA: Diagnosis not present

## 2017-06-17 DIAGNOSIS — Z8541 Personal history of malignant neoplasm of cervix uteri: Secondary | ICD-10-CM | POA: Insufficient documentation

## 2017-06-17 DIAGNOSIS — E669 Obesity, unspecified: Secondary | ICD-10-CM

## 2017-06-17 DIAGNOSIS — G40909 Epilepsy, unspecified, not intractable, without status epilepticus: Secondary | ICD-10-CM | POA: Insufficient documentation

## 2017-06-17 DIAGNOSIS — G43909 Migraine, unspecified, not intractable, without status migrainosus: Secondary | ICD-10-CM | POA: Insufficient documentation

## 2017-06-17 DIAGNOSIS — Z6841 Body Mass Index (BMI) 40.0 and over, adult: Secondary | ICD-10-CM | POA: Diagnosis not present

## 2017-06-17 NOTE — Progress Notes (Signed)
  Pre-Operative Nutrition Class:  Appt start time: 8:15 End time: 9:15  Patient was seen on 06/17/2017 for Pre-Operative Bariatric Surgery Education at [facility].   Surgery date: TBD Surgery type: RYGB Start weight at [facility]: 238.2 Weight today: 242.4   Samples given per MNT protocol. Patient educated on appropriate usage: Bariatric Advantage Multivitamin Lot # [id number] Exp: 10/2018  Bariatric Advantage Calcium Citrate Lot # [id number] Exp: 10/07/2017  Bariatric Advantage Calcium Citrate Lot # [id number] Exp: 07/26/2018  Premier Protein Drink Lot # [id number] Exp: 03/08/2018  The following the learning objectives were met by the patient during this course:  Identify Pre-Op Dietary Goals and will begin 2 weeks pre-operatively  Identify appropriate sources of fluids and proteins   State protein recommendations and appropriate sources pre and post-operatively  Identify Post-Operative Dietary Goals and will follow for 2 weeks post-operatively  Identify appropriate multivitamin and calcium sources  Describe the need for physical activity post-operatively and will follow MD recommendations  State when to call healthcare provider regarding medication questions or post-operative complications  Handouts given during class include:  Pre-Op Bariatric Surgery Diet Handout  Protein Shake Handout  Post-Op Bariatric Surgery Nutrition Handout  BELT Program Information Flyer  Support Group Information Flyer  [facility] Bariatric Supplements Price List  Follow-Up Plan: Patient will follow-up at [facility] 2 weeks post operatively for diet advancement per MD.

## 2017-06-20 DIAGNOSIS — F509 Eating disorder, unspecified: Secondary | ICD-10-CM | POA: Diagnosis not present

## 2017-07-10 ENCOUNTER — Encounter (HOSPITAL_COMMUNITY): Payer: Self-pay

## 2017-07-10 NOTE — Patient Instructions (Addendum)
Your procedure is scheduled on: Tuesday, July 16, 2017   Surgery Time:  7:30AM-11:00AM   Report to [facility] Main  Entrance    Report to admitting at 5:30 AM   Call this number if you have problems the morning of surgery [phone]   NO SOLID FOOD AFTER 600 PM THE NIGHT BEFORE YOUR SURGERY.   YOU MAY DRINK CLEAR FLUIDS until 430 am day of surgery.      CLEAR LIQUID DIET   Foods Allowed                                                                     Foods Excluded  Coffee and tea, regular and decaf                             liquids that you cannot  Plain Jell-O in any flavor                                             see through such as: Fruit ices (not with fruit pulp)                                     milk, soups, orange juice  Iced Popsicles                                    All solid food Carbonated beverages, regular and diet                                    Cranberry, grape and apple juices Sports drinks like Gatorade Lightly seasoned clear broth or consume(fat free) Sugar, honey syrup  Sample Menu Breakfast                                Lunch                                     Supper Cranberry juice                    Beef broth                            Chicken broth Jell-O                                     Grape juice                           Apple juice Coffee or tea  Jell-O                                      Popsicle                                                Coffee or tea                        Coffee or tea   Take these medicines the morning of surgery with A SIP OF WATER: Bupropion, Fluoxetine, Metoprolol, Omeprazole, Topiramate                               You may not have any metal on your body including hair pins, jewelry, and body piercings             Do not wear make-up, lotions, powders, perfumes/cologne, or deodorant             Do not wear nail polish.  Do not shave  48 hours prior to  surgery.               Do not bring valuables to the hospital. [place].   Contacts, dentures or bridgework may not be worn into surgery.   Leave suitcase in the car. After surgery it may be brought to your room.   Special Instructions: Bring a copy of your healthcare power of attorney and living will documents         the day of surgery if you haven't scanned them in before.              Please read over the following fact sheets you were given:   PAIN IS EXPECTED AFTER SURGERY AND WILL NOT BE COMPLETELY ELIMINATED. AMBULATION AND TYLENOL WILL HELP REDUCE INCISIONAL AND GAS PAIN. MOVEMENT IS KEY!   YOU ARE EXPECTED TO BE OUT OF BED WITHIN 4 HOURS OF ADMISSION TO YOUR PATIENT ROOM.     SITTING IN THE RECLINER THROUGHOUT THE DAY IS IMPORTANT FOR DRINKING FLUIDS AND MOVING GAS THROUGHOUT THE GI TRACT.   COMPRESSION STOCKINGS SHOULD BE WORN [place] UNLESS YOU ARE WALKING.    SPIROMETER SHOULD BE USED EVERY HOUR WHILE AWAKE TO DECREASE POST-OPERATIVE COMPLICATIONS SUCH AS PNEUMONIA.   INCENTIVE WHEN DISCHARGED HOME, IT IS IMPORTANT TO CONTINUE TO WALK EVERY HOUR AND USE THE INCENTIVE SPIROMETER EVERY HOUR.   [facility] - Preparing for Surgery Before surgery, you can play an important role.  Because skin is not sterile, your skin needs to be as free of germs as possible.  You can reduce the number of germs on your skin by washing with CHG (chlorahexidine gluconate) soap before surgery.  CHG is an antiseptic cleaner which kills germs and bonds with the skin to continue killing germs even after washing. Please DO NOT use if you have an allergy to CHG or antibacterial soaps.  If your skin becomes reddened/irritated stop using the CHG and inform your nurse when you arrive at Short Stay. Do not shave (including legs and underarms) for at least 48  hours prior to the first CHG shower.  You may shave your face/neck.  Please  follow these instructions carefully:  1.  Shower with CHG Soap the night before surgery and the  morning of surgery.  2.  If you choose to wash your hair, wash your hair first as usual with your normal  shampoo.  3.  After you shampoo, rinse your hair and body thoroughly to remove the shampoo.                             4.  Use CHG as you would any other liquid soap.  You can apply chg directly to the skin and wash.  Gently with a scrungie or clean washcloth.  5.  Apply the CHG Soap to your body ONLY FROM THE NECK DOWN.   Do   not use on face/ open                           Wound or open sores. Avoid contact with eyes, ears mouth and   genitals (private parts).                       Wash face,  Genitals (private parts) with your normal soap.             6.  Wash thoroughly, paying special attention to the area where your    surgery  will be performed.  7.  Thoroughly rinse your body with warm water from the neck down.  8.  DO NOT shower/wash with your normal soap after using and rinsing off the CHG Soap.                9.  Pat yourself dry with a clean towel.            10.  Wear clean pajamas.            11.  Place clean sheets on your bed the night of your first shower and do not  sleep with pets. Day of Surgery : Do not apply any lotions/deodorants the morning of surgery.  Please wear clean clothes to the hospital/surgery center.  FAILURE TO FOLLOW THESE INSTRUCTIONS MAY RESULT IN THE CANCELLATION OF YOUR SURGERY  PATIENT SIGNATURE_________________________________  NURSE SIGNATURE__________________________________  ________________________________________________________________________

## 2017-07-10 NOTE — Pre-Procedure Instructions (Signed)
The following are in epic: Last office note Dr. Brett Fairy  06/12/2017 EKG 06/05/2017 CXR 06/05/2017 KUB 06/05/2017  The following are in care everywhere: Stress test 12/07/2016 ECHO 11/26/2016

## 2017-07-11 ENCOUNTER — Encounter (HOSPITAL_COMMUNITY)
Admission: RE | Admit: 2017-07-11 | Discharge: 2017-07-11 | Disposition: A | Discharge status: Home / Self Care | Payer: BLUE CROSS/BLUE SHIELD | Source: Ambulatory Visit | Attending: Surgery | Admitting: Surgery

## 2017-07-11 ENCOUNTER — Ambulatory Visit: Payer: Self-pay | Admitting: Surgery

## 2017-07-11 ENCOUNTER — Encounter (HOSPITAL_COMMUNITY): Payer: Self-pay

## 2017-07-11 ENCOUNTER — Other Ambulatory Visit: Payer: Self-pay

## 2017-07-11 DIAGNOSIS — Z01812 Encounter for preprocedural laboratory examination: Secondary | ICD-10-CM | POA: Diagnosis not present

## 2017-07-11 HISTORY — DX: Other intervertebral disc degeneration, lumbar region without mention of lumbar back pain or lower extremity pain: M51.369

## 2017-07-11 HISTORY — DX: Personal history of other diseases of the digestive system: Z87.19

## 2017-07-11 HISTORY — DX: Anesthesia of skin: R20.0

## 2017-07-11 HISTORY — DX: Anesthesia of skin: R20.2

## 2017-07-11 HISTORY — DX: Palpitations: R00.2

## 2017-07-11 HISTORY — DX: Radiculopathy, cervical region: M54.12

## 2017-07-11 HISTORY — DX: Iron deficiency anemia, unspecified: D50.9

## 2017-07-11 HISTORY — DX: Sciatica, unspecified side: M54.30

## 2017-07-11 HISTORY — DX: Other intervertebral disc degeneration, lumbar region: M51.36

## 2017-07-11 HISTORY — DX: Dizziness and giddiness: R42

## 2017-07-11 HISTORY — DX: Panic disorder (episodic paroxysmal anxiety): F41.0

## 2017-07-11 HISTORY — DX: Obesity, unspecified: E66.9

## 2017-07-11 HISTORY — DX: Other constipation: K59.09

## 2017-07-11 HISTORY — DX: Syncope and collapse: R55

## 2017-07-11 HISTORY — DX: Insomnia, unspecified: G47.00

## 2017-07-11 LAB — BASIC METABOLIC PANEL
Anion gap: 4 — ABNORMAL LOW (ref 5–15)
BUN: 9 mg/dL (ref 6–20)
CALCIUM: 8.5 mg/dL — AB (ref 8.9–10.3)
CO2: 24 mmol/L (ref 22–32)
CREATININE: 0.96 mg/dL (ref 0.44–1.00)
Chloride: 114 mmol/L — ABNORMAL HIGH (ref 98–111)
GFR calc Af Amer: >60 mL/min (ref >60)
GLUCOSE: 91 mg/dL (ref 70–99)
Potassium: 4.4 mmol/L (ref 3.5–5.1)
SODIUM: 142 mmol/L (ref 135–145)

## 2017-07-11 LAB — CBC
HCT: 41.8 % (ref 36.0–46.0)
Hemoglobin: 13.6 g/dL (ref 12.0–15.0)
MCH: 30.8 pg (ref 26.0–34.0)
MCHC: 32.5 g/dL (ref 30.0–36.0)
MCV: 94.8 fL (ref 78.0–100.0)
PLATELETS: 378 10*3/uL (ref 150–400)
RBC: 4.41 MIL/uL (ref 3.87–5.11)
RDW: 13.8 % (ref 11.5–15.5)
WBC: 3.8 10*3/uL — AB (ref 4.0–10.5)

## 2017-07-12 ENCOUNTER — Other Ambulatory Visit (HOSPITAL_COMMUNITY): Payer: Self-pay | Admitting: *Deleted

## 2017-07-15 MED ORDER — BUPIVACAINE LIPOSOME 1.3 % IJ SUSP
20.0000 mL | Freq: Once | INTRAMUSCULAR | Status: DC
  Filled 2017-07-15: qty 20

## 2017-07-15 NOTE — Anesthesia Preprocedure Evaluation (Addendum)
Anesthesia Evaluation  Patient identified by MRN, date of birth, ID band Patient awake    Reviewed: Allergy & Precautions, NPO status , Patient's Chart, lab work & pertinent test results  History of Anesthesia Complications Negative for: history of anesthetic complications  Airway Mallampati: II  TM Distance: >3 FB Neck ROM: Full    Dental no notable dental hx. (+) Dental Advisory Given   Pulmonary neg pulmonary ROS, former smoker,    Pulmonary exam normal        Cardiovascular hypertension, Pt. on medications and Pt. on home beta blockers Normal cardiovascular exam     Neuro/Psych PSYCHIATRIC DISORDERS Anxiety Depression    GI/Hepatic Neg liver ROS, hiatal hernia, GERD  ,  Endo/Other  Morbid obesity  Renal/GU negative Renal ROS     Musculoskeletal  (+) Arthritis ,   Abdominal   Peds  Hematology negative hematology ROS (+)   Anesthesia Other Findings Day of surgery medications reviewed with the patient.  Reproductive/Obstetrics                            Anesthesia Physical Anesthesia Plan  ASA: III  Anesthesia Plan: General   Post-op Pain Management:    Induction: Intravenous  PONV Risk Score and Plan: 4 or greater and Ondansetron, Dexamethasone, Scopolamine patch - Pre-op and Diphenhydramine  Airway Management Planned: Oral ETT  Additional Equipment:   Intra-op Plan:   Post-operative Plan: Extubation in OR  Informed Consent: I have reviewed the patients History and Physical, chart, labs and discussed the procedure including the risks, benefits and alternatives for the proposed anesthesia with the patient or authorized representative who has indicated his/her understanding and acceptance.   Dental advisory given  Plan Discussed with: CRNA and Anesthesiologist  Anesthesia Plan Comments:        Anesthesia Quick Evaluation
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
## 2017-07-16 ENCOUNTER — Inpatient Hospital Stay (HOSPITAL_COMMUNITY): Payer: BLUE CROSS/BLUE SHIELD | Admitting: Anesthesiology

## 2017-07-16 ENCOUNTER — Encounter (HOSPITAL_COMMUNITY): Payer: Self-pay | Admitting: Certified Registered Nurse Anesthetist

## 2017-07-16 ENCOUNTER — Inpatient Hospital Stay (HOSPITAL_COMMUNITY)
Admission: RE | Admit: 2017-07-16 | Discharge: 2017-07-18 | DRG: 621 | Disposition: A | Discharge status: Home / Self Care | Payer: BLUE CROSS/BLUE SHIELD | Attending: Surgery | Admitting: Surgery

## 2017-07-16 ENCOUNTER — Encounter (HOSPITAL_COMMUNITY)
Admission: RE | Disposition: A | Discharge status: Home / Self Care | Payer: Self-pay | Source: Home / Self Care | Attending: Surgery

## 2017-07-16 ENCOUNTER — Other Ambulatory Visit: Payer: Self-pay

## 2017-07-16 DIAGNOSIS — Z825 Family history of asthma and other chronic lower respiratory diseases: Secondary | ICD-10-CM

## 2017-07-16 DIAGNOSIS — F419 Anxiety disorder, unspecified: Secondary | ICD-10-CM | POA: Diagnosis not present

## 2017-07-16 DIAGNOSIS — F329 Major depressive disorder, single episode, unspecified: Secondary | ICD-10-CM | POA: Diagnosis present

## 2017-07-16 DIAGNOSIS — Z6841 Body Mass Index (BMI) 40.0 and over, adult: Secondary | ICD-10-CM

## 2017-07-16 DIAGNOSIS — Z8249 Family history of ischemic heart disease and other diseases of the circulatory system: Secondary | ICD-10-CM | POA: Diagnosis not present

## 2017-07-16 DIAGNOSIS — K5909 Other constipation: Secondary | ICD-10-CM | POA: Diagnosis not present

## 2017-07-16 DIAGNOSIS — Z79899 Other long term (current) drug therapy: Secondary | ICD-10-CM | POA: Diagnosis not present

## 2017-07-16 DIAGNOSIS — D509 Iron deficiency anemia, unspecified: Secondary | ICD-10-CM | POA: Diagnosis present

## 2017-07-16 DIAGNOSIS — Z803 Family history of malignant neoplasm of breast: Secondary | ICD-10-CM | POA: Diagnosis not present

## 2017-07-16 DIAGNOSIS — Z833 Family history of diabetes mellitus: Secondary | ICD-10-CM

## 2017-07-16 DIAGNOSIS — M5136 Other intervertebral disc degeneration, lumbar region: Secondary | ICD-10-CM | POA: Diagnosis not present

## 2017-07-16 DIAGNOSIS — M5412 Radiculopathy, cervical region: Secondary | ICD-10-CM | POA: Diagnosis not present

## 2017-07-16 DIAGNOSIS — Z9884 Bariatric surgery status: Secondary | ICD-10-CM

## 2017-07-16 DIAGNOSIS — G4700 Insomnia, unspecified: Secondary | ICD-10-CM | POA: Diagnosis not present

## 2017-07-16 DIAGNOSIS — I10 Essential (primary) hypertension: Secondary | ICD-10-CM | POA: Diagnosis present

## 2017-07-16 DIAGNOSIS — K219 Gastro-esophageal reflux disease without esophagitis: Secondary | ICD-10-CM | POA: Diagnosis present

## 2017-07-16 DIAGNOSIS — G4710 Hypersomnia, unspecified: Secondary | ICD-10-CM | POA: Diagnosis present

## 2017-07-16 DIAGNOSIS — Z87891 Personal history of nicotine dependence: Secondary | ICD-10-CM | POA: Diagnosis not present

## 2017-07-16 DIAGNOSIS — F410 Panic disorder [episodic paroxysmal anxiety] without agoraphobia: Secondary | ICD-10-CM | POA: Diagnosis not present

## 2017-07-16 HISTORY — PX: GASTRIC ROUX-EN-Y: SHX5262

## 2017-07-16 LAB — CREATININE, SERUM: CREATININE: 0.99 mg/dL (ref 0.44–1.00)

## 2017-07-16 LAB — CBC
HCT: 40.6 % (ref 36.0–46.0)
Hemoglobin: 13.3 g/dL (ref 12.0–15.0)
MCH: 31.4 pg (ref 26.0–34.0)
MCHC: 32.8 g/dL (ref 30.0–36.0)
MCV: 95.8 fL (ref 78.0–100.0)
Platelets: 343 10*3/uL (ref 150–400)
RBC: 4.24 MIL/uL (ref 3.87–5.11)
RDW: 13.8 % (ref 11.5–15.5)
WBC: 8.6 10*3/uL (ref 4.0–10.5)

## 2017-07-16 LAB — HEPATIC FUNCTION PANEL
ALK PHOS: 55 U/L (ref 38–126)
ALT: 23 U/L (ref 0–44)
AST: 21 U/L (ref 15–41)
Albumin: 3.7 g/dL (ref 3.5–5.0)
BILIRUBIN INDIRECT: 0.6 mg/dL (ref 0.3–0.9)
BILIRUBIN TOTAL: 0.7 mg/dL (ref 0.3–1.2)
Bilirubin, Direct: 0.1 mg/dL (ref 0.0–0.2)
Total Protein: 7.1 g/dL (ref 6.5–8.1)

## 2017-07-16 LAB — CBC WITH DIFFERENTIAL/PLATELET
BASOS ABS: 0 10*3/uL (ref 0.0–0.1)
BASOS PCT: 0 %
EOS ABS: 0 10*3/uL (ref 0.0–0.7)
EOS PCT: 1 %
HEMATOCRIT: 41.3 % (ref 36.0–46.0)
Hemoglobin: 13.5 g/dL (ref 12.0–15.0)
LYMPHS PCT: 44 %
Lymphs Abs: 1.7 10*3/uL (ref 0.7–4.0)
MCH: 30.8 pg (ref 26.0–34.0)
MCHC: 32.7 g/dL (ref 30.0–36.0)
MCV: 94.1 fL (ref 78.0–100.0)
Monocytes Absolute: 0.4 10*3/uL (ref 0.1–1.0)
Monocytes Relative: 10 %
Neutro Abs: 1.8 10*3/uL (ref 1.7–7.7)
Neutrophils Relative %: 45 %
Platelets: 396 10*3/uL (ref 150–400)
RBC: 4.39 MIL/uL (ref 3.87–5.11)
RDW: 13.9 % (ref 11.5–15.5)
WBC: 3.9 10*3/uL — AB (ref 4.0–10.5)

## 2017-07-16 LAB — TYPE AND SCREEN
ABO/RH(D): O POS
Antibody Screen: NEGATIVE

## 2017-07-16 LAB — HEMOGLOBIN AND HEMATOCRIT, BLOOD
HEMATOCRIT: 42 % (ref 36.0–46.0)
HEMOGLOBIN: 13.6 g/dL (ref 12.0–15.0)

## 2017-07-16 LAB — PREGNANCY, URINE: Preg Test, Ur: NEGATIVE

## 2017-07-16 LAB — ABO/RH: ABO/RH(D): O POS

## 2017-07-16 SURGERY — LAPAROSCOPIC ROUX-EN-Y GASTRIC BYPASS WITH UPPER ENDOSCOPY
Anesthesia: General | Site: Abdomen

## 2017-07-16 MED ORDER — APREPITANT 40 MG PO CAPS
40.0000 mg | ORAL_CAPSULE | ORAL | Status: AC
  Administered 2017-07-16: 40 mg via ORAL
  Filled 2017-07-16: qty 1

## 2017-07-16 MED ORDER — GLYCOPYRROLATE 0.2 MG/ML IV SOSY
PREFILLED_SYRINGE | INTRAVENOUS | Status: AC
  Filled 2017-07-16: qty 3

## 2017-07-16 MED ORDER — ROCURONIUM BROMIDE 10 MG/ML (PF) SYRINGE
PREFILLED_SYRINGE | INTRAVENOUS | Status: AC
  Filled 2017-07-16: qty 10

## 2017-07-16 MED ORDER — FENTANYL CITRATE (PF) 100 MCG/2ML IJ SOLN
25.0000 ug | INTRAMUSCULAR | Status: DC | PRN
  Administered 2017-07-16 (×2): 50 ug via INTRAVENOUS

## 2017-07-16 MED ORDER — PROPOFOL 10 MG/ML IV BOLUS
INTRAVENOUS | Status: DC | PRN
  Administered 2017-07-16: 200 mg via INTRAVENOUS

## 2017-07-16 MED ORDER — CELECOXIB 200 MG PO CAPS
400.0000 mg | ORAL_CAPSULE | ORAL | Status: AC
  Administered 2017-07-16: 400 mg via ORAL
  Filled 2017-07-16: qty 2

## 2017-07-16 MED ORDER — BUPIVACAINE LIPOSOME 1.3 % IJ SUSP
INTRAMUSCULAR | Status: DC | PRN
  Administered 2017-07-16: 20 mL

## 2017-07-16 MED ORDER — FENTANYL CITRATE (PF) 100 MCG/2ML IJ SOLN
INTRAMUSCULAR | Status: AC
  Filled 2017-07-16: qty 2

## 2017-07-16 MED ORDER — SCOPOLAMINE 1 MG/3DAYS TD PT72
1.0000 | MEDICATED_PATCH | TRANSDERMAL | Status: DC
  Administered 2017-07-16: 1.5 mg via TRANSDERMAL
  Filled 2017-07-16: qty 1

## 2017-07-16 MED ORDER — LACTATED RINGERS IR SOLN
Status: DC | PRN
  Administered 2017-07-16: 1000 mL

## 2017-07-16 MED ORDER — ACETAMINOPHEN 500 MG PO TABS
1000.0000 mg | ORAL_TABLET | ORAL | Status: AC
  Administered 2017-07-16: 1000 mg via ORAL
  Filled 2017-07-16: qty 2

## 2017-07-16 MED ORDER — TISSEEL VH 10 ML EX KIT
PACK | CUTANEOUS | Status: DC | PRN
  Administered 2017-07-16: 1

## 2017-07-16 MED ORDER — SUGAMMADEX SODIUM 200 MG/2ML IV SOLN
INTRAVENOUS | Status: DC | PRN
  Administered 2017-07-16: 400 mg via INTRAVENOUS

## 2017-07-16 MED ORDER — CHLORHEXIDINE GLUCONATE CLOTH 2 % EX PADS: 6.0000 | MEDICATED_PAD | Freq: Once | CUTANEOUS | Status: DC

## 2017-07-16 MED ORDER — MORPHINE SULFATE (PF) 2 MG/ML IV SOLN
1.0000 mg | INTRAVENOUS | Status: DC | PRN
  Administered 2017-07-16 (×2): 2 mg via INTRAVENOUS
  Filled 2017-07-16 (×2): qty 1

## 2017-07-16 MED ORDER — KCL IN DEXTROSE-NACL 20-5-0.45 MEQ/L-%-% IV SOLN
INTRAVENOUS | Status: DC
  Administered 2017-07-16 – 2017-07-17 (×4): via INTRAVENOUS
  Administered 2017-07-18: 1000 mL via INTRAVENOUS
  Filled 2017-07-16 (×5): qty 1000

## 2017-07-16 MED ORDER — KETAMINE HCL 10 MG/ML IJ SOLN
INTRAMUSCULAR | Status: AC
  Filled 2017-07-16: qty 1

## 2017-07-16 MED ORDER — HEPARIN SODIUM (PORCINE) 5000 UNIT/ML IJ SOLN
5000.0000 [IU] | INTRAMUSCULAR | Status: AC
  Administered 2017-07-16: 5000 [IU] via SUBCUTANEOUS
  Filled 2017-07-16: qty 1

## 2017-07-16 MED ORDER — KETAMINE HCL 10 MG/ML IJ SOLN
INTRAMUSCULAR | Status: DC | PRN
  Administered 2017-07-16: 30 mg via INTRAVENOUS
  Administered 2017-07-16: 20 mg via INTRAVENOUS

## 2017-07-16 MED ORDER — PROMETHAZINE HCL 25 MG/ML IJ SOLN: 6.2500 mg | INTRAMUSCULAR | Status: DC | PRN

## 2017-07-16 MED ORDER — LACTATED RINGERS IV SOLN
INTRAVENOUS | Status: DC
  Administered 2017-07-16 (×2): via INTRAVENOUS

## 2017-07-16 MED ORDER — SCOPOLAMINE 1 MG/3DAYS TD PT72: 1.0000 | MEDICATED_PATCH | TRANSDERMAL | Status: DC

## 2017-07-16 MED ORDER — PREMIER PROTEIN SHAKE
2.0000 [oz_av] | ORAL | Status: DC
  Administered 2017-07-17 – 2017-07-18 (×11): 2 [oz_av] via ORAL

## 2017-07-16 MED ORDER — MIDAZOLAM HCL 2 MG/2ML IJ SOLN
INTRAMUSCULAR | Status: AC
  Filled 2017-07-16: qty 2

## 2017-07-16 MED ORDER — LIDOCAINE 2% (20 MG/ML) 5 ML SYRINGE
INTRAMUSCULAR | Status: AC
Start: 2017-07-16 — End: ?
  Filled 2017-07-16: qty 5

## 2017-07-16 MED ORDER — ONDANSETRON HCL 4 MG/2ML IJ SOLN
4.0000 mg | INTRAMUSCULAR | Status: DC | PRN
  Administered 2017-07-17 (×2): 4 mg via INTRAVENOUS
  Filled 2017-07-16 (×2): qty 2

## 2017-07-16 MED ORDER — GLYCOPYRROLATE PF 0.2 MG/ML IJ SOSY
PREFILLED_SYRINGE | INTRAMUSCULAR | Status: DC | PRN
  Administered 2017-07-16: .1 mg via INTRAVENOUS

## 2017-07-16 MED ORDER — FENTANYL CITRATE (PF) 250 MCG/5ML IJ SOLN
INTRAMUSCULAR | Status: DC | PRN
  Administered 2017-07-16: 50 ug via INTRAVENOUS
  Administered 2017-07-16: 100 ug via INTRAVENOUS
  Administered 2017-07-16: 50 ug via INTRAVENOUS

## 2017-07-16 MED ORDER — FAMOTIDINE IN NACL 20-0.9 MG/50ML-% IV SOLN
20.0000 mg | Freq: Two times a day (BID) | INTRAVENOUS | Status: DC
  Administered 2017-07-16 – 2017-07-18 (×4): 20 mg via INTRAVENOUS
  Filled 2017-07-16 (×4): qty 50

## 2017-07-16 MED ORDER — LIDOCAINE HCL 2 % IJ SOLN
INTRAMUSCULAR | Status: AC
  Filled 2017-07-16: qty 20

## 2017-07-16 MED ORDER — MIDAZOLAM HCL 5 MG/5ML IJ SOLN
INTRAMUSCULAR | Status: DC | PRN
  Administered 2017-07-16: 2 mg via INTRAVENOUS

## 2017-07-16 MED ORDER — PHENYLEPHRINE 40 MCG/ML (10ML) SYRINGE FOR IV PUSH (FOR BLOOD PRESSURE SUPPORT)
PREFILLED_SYRINGE | INTRAVENOUS | Status: AC
  Filled 2017-07-16: qty 10

## 2017-07-16 MED ORDER — LIDOCAINE 2% (20 MG/ML) 5 ML SYRINGE
INTRAMUSCULAR | Status: DC | PRN
Start: 2017-07-16 — End: 2017-07-16
  Administered 2017-07-16: 60 mg via INTRAVENOUS

## 2017-07-16 MED ORDER — EPHEDRINE 5 MG/ML INJ
INTRAVENOUS | Status: AC
  Filled 2017-07-16: qty 10

## 2017-07-16 MED ORDER — SUGAMMADEX SODIUM 500 MG/5ML IV SOLN
INTRAVENOUS | Status: AC
  Filled 2017-07-16: qty 5

## 2017-07-16 MED ORDER — OXYCODONE HCL 5 MG/5ML PO SOLN
5.0000 mg | ORAL | Status: DC | PRN
  Administered 2017-07-16 – 2017-07-17 (×2): 5 mg via ORAL
  Filled 2017-07-16 (×2): qty 5

## 2017-07-16 MED ORDER — DEXAMETHASONE SODIUM PHOSPHATE 10 MG/ML IJ SOLN
INTRAMUSCULAR | Status: AC
  Filled 2017-07-16: qty 1

## 2017-07-16 MED ORDER — PHENYLEPHRINE 40 MCG/ML (10ML) SYRINGE FOR IV PUSH (FOR BLOOD PRESSURE SUPPORT)
PREFILLED_SYRINGE | INTRAVENOUS | Status: DC | PRN
  Administered 2017-07-16 (×6): 80 ug via INTRAVENOUS

## 2017-07-16 MED ORDER — GABAPENTIN 300 MG PO CAPS
300.0000 mg | ORAL_CAPSULE | ORAL | Status: AC
  Administered 2017-07-16: 300 mg via ORAL
  Filled 2017-07-16: qty 1

## 2017-07-16 MED ORDER — TISSEEL VH 10 ML EX KIT
PACK | CUTANEOUS | Status: AC
  Filled 2017-07-16: qty 1

## 2017-07-16 MED ORDER — DEXAMETHASONE SODIUM PHOSPHATE 4 MG/ML IJ SOLN: 4.0000 mg | INTRAMUSCULAR | Status: DC

## 2017-07-16 MED ORDER — ROCURONIUM BROMIDE 10 MG/ML (PF) SYRINGE
PREFILLED_SYRINGE | INTRAVENOUS | Status: DC | PRN
  Administered 2017-07-16: 30 mg via INTRAVENOUS
  Administered 2017-07-16: 20 mg via INTRAVENOUS
  Administered 2017-07-16: 70 mg via INTRAVENOUS

## 2017-07-16 MED ORDER — ACETAMINOPHEN 160 MG/5ML PO SOLN
650.0000 mg | Freq: Four times a day (QID) | ORAL | Status: DC
  Administered 2017-07-16 – 2017-07-18 (×7): 650 mg via ORAL
  Filled 2017-07-16 (×7): qty 20.3

## 2017-07-16 MED ORDER — SODIUM CHLORIDE 0.9 % IJ SOLN
INTRAMUSCULAR | Status: AC
  Filled 2017-07-16: qty 10

## 2017-07-16 MED ORDER — LIDOCAINE 2% (20 MG/ML) 5 ML SYRINGE
INTRAMUSCULAR | Status: AC
  Filled 2017-07-16: qty 5

## 2017-07-16 MED ORDER — EPHEDRINE SULFATE-NACL 50-0.9 MG/10ML-% IV SOSY
PREFILLED_SYRINGE | INTRAVENOUS | Status: DC | PRN
  Administered 2017-07-16: 5 mg via INTRAVENOUS
  Administered 2017-07-16 (×2): 10 mg via INTRAVENOUS

## 2017-07-16 MED ORDER — FENTANYL CITRATE (PF) 250 MCG/5ML IJ SOLN
INTRAMUSCULAR | Status: AC
  Filled 2017-07-16: qty 5

## 2017-07-16 MED ORDER — ONDANSETRON HCL 4 MG/2ML IJ SOLN
INTRAMUSCULAR | Status: AC
  Filled 2017-07-16: qty 2

## 2017-07-16 MED ORDER — SIMETHICONE 80 MG PO CHEW: 80.0000 mg | CHEWABLE_TABLET | Freq: Four times a day (QID) | ORAL | Status: DC | PRN

## 2017-07-16 MED ORDER — DEXAMETHASONE SODIUM PHOSPHATE 10 MG/ML IJ SOLN
INTRAMUSCULAR | Status: DC | PRN
  Administered 2017-07-16: 10 mg via INTRAVENOUS

## 2017-07-16 MED ORDER — GABAPENTIN 100 MG PO CAPS
200.0000 mg | ORAL_CAPSULE | Freq: Two times a day (BID) | ORAL | Status: DC
  Administered 2017-07-16 – 2017-07-18 (×4): 200 mg via ORAL
  Filled 2017-07-16 (×4): qty 2

## 2017-07-16 MED ORDER — HEPARIN SODIUM (PORCINE) 5000 UNIT/ML IJ SOLN
5000.0000 [IU] | Freq: Three times a day (TID) | INTRAMUSCULAR | Status: DC
  Administered 2017-07-16 – 2017-07-18 (×5): 5000 [IU] via SUBCUTANEOUS
  Filled 2017-07-16 (×5): qty 1

## 2017-07-16 MED ORDER — SODIUM CHLORIDE 0.9 % IV SOLN
2.0000 g | INTRAVENOUS | Status: AC
  Administered 2017-07-16: 2 g via INTRAVENOUS
  Filled 2017-07-16: qty 2

## 2017-07-16 MED ORDER — LIDOCAINE 2% (20 MG/ML) 5 ML SYRINGE
INTRAMUSCULAR | Status: DC | PRN
  Administered 2017-07-16: 1.5 mg/kg/h via INTRAVENOUS

## 2017-07-16 MED ORDER — SODIUM CHLORIDE 0.9 % IJ SOLN
INTRAMUSCULAR | Status: DC | PRN
  Administered 2017-07-16: 10 mL

## 2017-07-16 MED ORDER — ONDANSETRON HCL 4 MG/2ML IJ SOLN
INTRAMUSCULAR | Status: DC | PRN
  Administered 2017-07-16: 4 mg via INTRAVENOUS

## 2017-07-16 SURGICAL SUPPLY — 76 items
ADH SKN CLS APL DERMABOND .7 (GAUZE/BANDAGES/DRESSINGS) ×1
APL SKNCLS STERI-STRIP NONHPOA (GAUZE/BANDAGES/DRESSINGS)
APL SRG 32X5 SNPLK LF DISP (MISCELLANEOUS) ×1
APPLICATOR COTTON TIP 6IN STRL (MISCELLANEOUS) ×3 IMPLANT
APPLIER CLIP ROT 10 11.4 M/L (STAPLE)
APPLIER CLIP ROT 13.4 12 LRG (CLIP) ×2
APR CLP LRG 13.4X12 ROT 20 MLT (CLIP) ×1
APR CLP MED LRG 11.4X10 (STAPLE)
BENZOIN TINCTURE PRP APPL 2/3 (GAUZE/BANDAGES/DRESSINGS) IMPLANT
BLADE SURG 15 STRL LF DISP TIS (BLADE) ×1 IMPLANT
BLADE SURG 15 STRL SS (BLADE) ×2
CABLE HIGH FREQUENCY MONO STRZ (ELECTRODE) IMPLANT
CLIP APPLIE ROT 10 11.4 M/L (STAPLE) IMPLANT
CLIP APPLIE ROT 13.4 12 LRG (CLIP) IMPLANT
CLIP SUT LAPRA TY ABSORB (SUTURE) ×2 IMPLANT
DERMABOND ADVANCED (GAUZE/BANDAGES/DRESSINGS) ×1
DERMABOND ADVANCED .7 DNX12 (GAUZE/BANDAGES/DRESSINGS) ×1 IMPLANT
DEVICE SUT QUICK LOAD TK 5 (STAPLE) IMPLANT
DEVICE SUT TI-KNOT TK 5X26 (MISCELLANEOUS) IMPLANT
DEVICE SUTURE ENDOST 10MM (ENDOMECHANICALS) ×2 IMPLANT
DISSECTOR BLUNT TIP ENDO 5MM (MISCELLANEOUS) IMPLANT
DRAIN PENROSE 18X1/4 LTX STRL (WOUND CARE) ×2 IMPLANT
GAUZE 4X4 16PLY RFD (DISPOSABLE) ×2 IMPLANT
GAUZE SPONGE 4X4 12PLY STRL (GAUZE/BANDAGES/DRESSINGS) IMPLANT
GLOVE BIOGEL M 8.0 STRL (GLOVE) ×2 IMPLANT
GOWN STRL REUS W/TWL XL LVL3 (GOWN DISPOSABLE) ×8 IMPLANT
HANDLE STAPLE EGIA 4 XL (STAPLE) ×2 IMPLANT
HOVERMATT SINGLE USE (MISCELLANEOUS) ×2 IMPLANT
KIT BASIN OR (CUSTOM PROCEDURE TRAY) ×2 IMPLANT
KIT GASTRIC LAVAGE 34FR ADT (SET/KITS/TRAYS/PACK) ×2 IMPLANT
MARKER SKIN DUAL TIP RULER LAB (MISCELLANEOUS) ×2 IMPLANT
NDL SPNL 22GX3.5 QUINCKE BK (NEEDLE) ×1 IMPLANT
NEEDLE SPNL 22GX3.5 QUINCKE BK (NEEDLE) ×2 IMPLANT
PACK CARDIOVASCULAR III (CUSTOM PROCEDURE TRAY) ×2 IMPLANT
RELOAD EGIA 45 MED/THCK PURPLE (STAPLE) IMPLANT
RELOAD EGIA 45 TAN VASC (STAPLE) IMPLANT
RELOAD EGIA 60 MED/THCK PURPLE (STAPLE) IMPLANT
RELOAD EGIA 60 TAN VASC (STAPLE) IMPLANT
RELOAD ENDO STITCH 2.0 (ENDOMECHANICALS) ×18
RELOAD STAPLE 45 PURP MED/THCK (STAPLE) IMPLANT
RELOAD STAPLE 60 MED/THCK ART (STAPLE) IMPLANT
RELOAD SUT SNGL STCH ABSRB 2-0 (ENDOMECHANICALS) ×5 IMPLANT
RELOAD SUT SNGL STCH BLK 2-0 (ENDOMECHANICALS) ×4 IMPLANT
RELOAD TRI 45 ART MED THCK PUR (STAPLE) ×2 IMPLANT
RELOAD TRI 60 ART MED THCK PUR (STAPLE) ×2 IMPLANT
SCISSORS LAP 5X45 EPIX DISP (ENDOMECHANICALS) ×2 IMPLANT
SEALANT SURGICAL APPL DUAL CAN (MISCELLANEOUS) ×2 IMPLANT
SET IRRIG TUBING LAPAROSCOPIC (IRRIGATION / IRRIGATOR) ×2 IMPLANT
SHEARS HARMONIC ACE PLUS 45CM (MISCELLANEOUS) ×2 IMPLANT
SLEEVE ADV FIXATION 12X100MM (TROCAR) ×5 IMPLANT
SLEEVE ADV FIXATION 5X100MM (TROCAR) IMPLANT
SOLUTION ANTI FOG 6CC (MISCELLANEOUS) ×2 IMPLANT
STAPLER VISISTAT 35W (STAPLE) ×2 IMPLANT
STRIP CLOSURE SKIN 1/2X4 (GAUZE/BANDAGES/DRESSINGS) IMPLANT
SUT RELOAD ENDO STITCH 2 48X1 (ENDOMECHANICALS) ×5
SUT RELOAD ENDO STITCH 2.0 (ENDOMECHANICALS) ×4
SUT SURGIDAC NAB ES-9 0 48 120 (SUTURE) IMPLANT
SUT VIC AB 2-0 SH 27 (SUTURE) ×2
SUT VIC AB 2-0 SH 27X BRD (SUTURE) ×1 IMPLANT
SUT VIC AB 4-0 SH 18 (SUTURE) ×2 IMPLANT
SUTURE RELOAD END STTCH 2 48X1 (ENDOMECHANICALS) ×5 IMPLANT
SUTURE RELOAD ENDO STITCH 2.0 (ENDOMECHANICALS) ×4 IMPLANT
SYR 10ML ECCENTRIC (SYRINGE) ×2 IMPLANT
SYR 20CC LL (SYRINGE) ×4 IMPLANT
SYR 50ML LL SCALE MARK (SYRINGE) ×2 IMPLANT
TOWEL OR 17X26 10 PK STRL BLUE (TOWEL DISPOSABLE) ×4 IMPLANT
TOWEL OR NON WOVEN STRL DISP B (DISPOSABLE) ×2 IMPLANT
TRAY FOLEY CATH 14FR (SET/KITS/TRAYS/PACK) ×1 IMPLANT
TROCAR ADV FIXATION 12X100MM (TROCAR) ×2 IMPLANT
TROCAR ADV FIXATION 5X100MM (TROCAR) ×2 IMPLANT
TROCAR BLADELESS OPT 5 100 (ENDOMECHANICALS) ×2 IMPLANT
TROCAR XCEL 12X100 BLDLESS (ENDOMECHANICALS) ×2 IMPLANT
TUBE CALIBRATION LAPBAND (TUBING) ×1 IMPLANT
TUBING CONNECTING 10 (TUBING) ×4 IMPLANT
TUBING ENDO SMARTCAP PENTAX (MISCELLANEOUS) ×2 IMPLANT
TUBING INSUF HEATED (TUBING) ×2 IMPLANT

## 2017-07-16 NOTE — Progress Notes (Signed)
Assumed care from [facility] at 1110

## 2017-07-16 NOTE — Transfer of Care (Signed)
Immediate Anesthesia Transfer of Care Note  Patient: BIANKA LIBERATI  Procedure(s) Performed: LAPAROSCOPIC ROUX-EN-Y GASTRIC BYPASS WITH UPPER ENDOSCOPY AND ERAS PATHWAY (N/A Abdomen)  Patient Location: PACU  Anesthesia Type:General  Level of Consciousness: oriented, drowsy and patient cooperative  Airway & Oxygen Therapy: Patient Spontanous Breathing and Patient connected to face mask oxygen  Post-op Assessment: Report given to RN, Post -op Vital signs reviewed and stable and Patient moving all extremities X 4  Post vital signs: Reviewed and stable  Last Vitals:  Vitals Value Taken Time  BP 145/81 07/16/2017 11:02 AM  Temp    Pulse 67 07/16/2017 11:06 AM  Resp 16 07/16/2017 11:04 AM  SpO2 100 % 07/16/2017 11:06 AM  Vitals shown include unvalidated device data.  Last Pain:  Vitals:   07/16/17 0701  TempSrc:   PainSc: 2       Patients Stated Pain Goal: 2 (12/24/80 5003)  Complications: No apparent anesthesia complications

## 2017-07-16 NOTE — Op Note (Signed)
Surgeon: Althea Grimmer. Hassell Done, MD, FACS Asst:  Gurney Maxin, MD Anesthesia: General endotracheal Drains: None  Procedure: Laparoscopic Roux en Y gastric bypass with 40 cm BP limb and 100 cm Roux limb, antecolic, antegastric, candy cane to the left.  Closure of Peterson's defect. Upper endoscopy.   Description of Procedure:  The patient was taken to OR 1 at [facility] and given general anesthesia.  The abdomen was prepped with PCMX and draped sterilely.  A time out was performed.    The operation began by identifying the ligament of Treitz. I measured 40 cm downstream and divided the bowel with a 6 cm Covidian stapler.  I sutured a Penrose drain along the Roux limb end.  I measured a 1 meter (100 cm) Roux limb and then placed the distal bowels to the BP limb side by side and performed a stapled jejunojejunostomy. The common defect was closed from either end with 4-0 Vicryl using the Endo Stitch. The mesenteric defect was closed with a running 2-0 silk using the Endo Stitch. Tisseel was applied to the suture line.  The omentum was divided with the harmonic scalpel.  The Nathanson retractor was inserted in the left lateral segment of liver was retracted. The foregut dissection ensued.  Balloon test for hiatal hernia was negative.  5 cm along lessor curvature the dissection into the lessor sac was performed.  Once entered the pouch was created with Covidien purple loads 6 cm with the first two without TRS and then the remaining with TRS.   The Roux limb was then brought up with the candycane pointed left and a back row of sutures of 2-0 Vicryl were placed. I opened along the right side of each structure and inserted the 4.5 cm stapler to create the gastrojejunostomy. The common defect was closed from either end with 2-0 Vicryl and a second row was placed anterior to that the Ewald tube acting as a stent across the anastomosis. The Penrose drain was removed. Peterson's defect was closed with 2-0 silk.   Endoscopy  was performed by Dr. Kieth Brightly and so bleeding or leaks were seen.  The incisions were injected by way of a TAP block performed at the beginning of the case and were closed with 4-0 Monocryl and Dermabond.    The patient was taken to the recovery room in satisfactory condition.  Matt B. Hassell Done, MD, FACS

## 2017-07-16 NOTE — Anesthesia Procedure Notes (Signed)
Procedure Name: Intubation Date/Time: 07/16/2017 7:41 AM Performed by: Mitzie Na, CRNA Pre-anesthesia Checklist: Patient identified, Emergency Drugs available, Suction available, Patient being monitored and Timeout performed Patient Re-evaluated:Patient Re-evaluated prior to induction Oxygen Delivery Method: Circle system utilized Preoxygenation: Pre-oxygenation with 100% oxygen Induction Type: IV induction Ventilation: Mask ventilation without difficulty Laryngoscope Size: Mac and 3 Grade View: Grade I Tube type: Oral Tube size: 7.5 mm Number of attempts: 1 Airway Equipment and Method: Stylet Placement Confirmation: ETT inserted through vocal cords under direct vision,  positive ETCO2 and breath sounds checked- equal and bilateral Secured at: 22 cm Tube secured with: Tape Dental Injury: Teeth and Oropharynx as per pre-operative assessment

## 2017-07-16 NOTE — Interval H&P Note (Signed)
History and Physical Interval Note:  07/16/2017 7:26 AM  Brooke [name]  has presented today for surgery, with the diagnosis of MORBID OBESITY  The various methods of treatment have been discussed with the patient and family. After consideration of risks, benefits and other options for treatment, the patient has consented to  Procedure(s): LAPAROSCOPIC ROUX-EN-Y GASTRIC BYPASS WITH UPPER ENDOSCOPY AND ERAS PATHWAY (N/A) as a surgical intervention .  The patient's history has been reviewed, patient examined, no change in status, stable for surgery.  I have reviewed the patient's chart and labs.  Questions were answered to the patient's satisfaction.     Pedro Earls

## 2017-07-16 NOTE — Discharge Instructions (Signed)
° ° ° °GASTRIC BYPASS/SLEEVE ° Home Care Instructions ° ° These instructions are to help you care for yourself when you go home. ° °Call: If you have any problems. °• Call [phone] and ask for the surgeon on call °• If you need immediate help, come to the ER at [place].  °• Tell the ER staff that you are a new post-op gastric bypass or gastric sleeve patient °  °Signs and symptoms to report: • Severe vomiting or nausea °o If you cannot keep down clear liquids for longer than 1 day, call your surgeon  °• Abdominal pain that does not get better after taking your pain medication °• Fever over 100.4° F with chills °• Heart beating over 100 beats a minute °• Shortness of breath at rest °• Chest pain °•  Redness, swelling, drainage, or foul odor at incision (surgical) sites °•  If your incisions open or pull apart °• Swelling or pain in calf (lower leg) °• Diarrhea (Loose bowel movements that happen often), frequent watery, uncontrolled bowel movements °• Constipation, (no bowel movements for 3 days) if this happens: Pick one °o Milk of Magnesia, 2 tablespoons by mouth, 3 times a day for 2 days if needed °o Stop taking Milk of Magnesia once you have a bowel movement °o Call your doctor if constipation continues °Or °o Miralax  (instead of Milk of Magnesia) following the label instructions °o Stop taking Miralax once you have a bowel movement °o Call your doctor if constipation continues °• Anything you think is not normal °  °Normal side effects after surgery: • Unable to sleep at night or unable to focus °• Irritability or moody °• Being tearful (crying) or depressed °These are common complaints, possibly related to your anesthesia medications that put you to sleep, stress of surgery, and change in lifestyle.  This usually goes away a few weeks after surgery.  If these feelings continue, call your primary care doctor. °  °Wound Care: You may have surgical glue, steri-strips, or staples over your incisions after  surgery °• Surgical glue:  Looks like a clear film over your incisions and will wear off a little at a time °• Steri-strips: Strips of tape over your incisions. You may notice a yellowish color on the skin under the steri-strips. This is used to make the   steri-strips stick better. Do not pull the steri-strips off - let them fall off °• Staples: Staples may be removed before you leave the hospital °o If you go home with staples, call [facility], [phone] at for an appointment with your surgeon’s nurse to have staples removed 10 days after surgery. °• Showering: You may shower two (2) days after your surgery unless your surgeon tells you differently °o Wash gently around incisions with warm soapy water, rinse well, and gently pat dry  °o No tub baths until staples are removed, steri-strips fall off or glue is gone.  °  °Medications: • Medications should be liquid or crushed if larger than the size of a dime °• Extended release pills (medication that release a little bit at a time through the day) should NOT be crushed or cut. (examples include XL, ER, DR, SR) °• Depending on the size and number of medications you take, you may need to space (take a few throughout the day)/change the time you take your medications so that you do not over-fill your pouch (smaller stomach) °• Make sure you follow-up with your primary care doctor to   make medication changes needed during rapid weight loss and life-style changes °• If you have diabetes, follow up with the doctor that orders your diabetes medication(s) within one week after surgery and check your blood sugar regularly. °• Do not drive while taking prescription pain medication  °• It is ok to take Tylenol by the bottle instructions with your pain medicine or instead of your pain medicine as needed.  DO NOT TAKE NSAIDS (EXAMPLES OF NSAIDS:  IBUPROFREN/ NAPROXEN)  °Diet:                    First 2 Weeks ° You will see the dietician t about two (2) weeks  after your surgery. The dietician will increase the types of foods you can eat if you are handling liquids well: °• If you have severe vomiting or nausea and cannot keep down clear liquids lasting longer than 1 day, call your surgeon @ ([phone]) °Protein Shake °• Drink at least 2 ounces of shake 5-6 times per day °• Each serving of protein shakes (usually 8 - 12 ounces) should have: °o 15 grams of protein  °o And no more than 5 grams of carbohydrate  °• Goal for protein each day: °o Men = 80 grams per day °o Women = 60 grams per day °• Protein powder may be added to fluids such as non-fat milk or Lactaid milk or unsweetened Soy/Almond milk (limit to 35 grams added protein powder per serving) ° °Hydration °• Slowly increase the amount of water and other clear liquids as tolerated (See Acceptable Fluids) °• Slowly increase the amount of protein shake as tolerated  °•  Sip fluids slowly and throughout the day.  Do not use straws. °• May use sugar substitutes in small amounts (no more than 6 - 8 packets per day; i.e. Splenda) ° °Fluid Goal °• The first goal is to drink at least 8 ounces of protein shake/drink per day (or as directed by the nutritionist); some examples of protein shakes are Syntrax Nectar, Adkins Advantage, EAS Edge HP, and Unjury. See handout from pre-op Bariatric Education Class: °o Slowly increase the amount of protein shake you drink as tolerated °o You may find it easier to slowly sip shakes throughout the day °o It is important to get your proteins in first °• Your fluid goal is to drink 64 - 100 ounces of fluid daily °o It may take a few weeks to build up to this °• 32 oz (or more) should be clear liquids  °And  °• 32 oz (or more) should be full liquids (see below for examples) °• Liquids should not contain sugar, caffeine, or carbonation ° °Clear Liquids: °• Water or Sugar-free flavored water (i.e. Fruit H2O, Propel) °• Decaffeinated coffee or tea (sugar-free) °• Crystal Lite, Wyler’s Lite,  Minute Maid Lite °• Sugar-free Jell-O °• Bouillon or broth °• Sugar-free Popsicle:   *Less than 20 calories each; Limit 1 per day ° °Full Liquids: °Protein Shakes/Drinks + 2 choices per day of other full liquids °• Full liquids must be: °o No More Than 15 grams of Carbs per serving  °o No More Than 3 grams of Fat per serving °• Strained low-fat cream soup (except Cream of Potato or Tomato) °• Non-Fat milk °• Fat-free Lactaid Milk °• Unsweetened Soy Or Unsweetened Almond Milk °• Low Sugar yogurt (Dannon Lite & Fit, Greek yogurt; Oikos Triple Zero; Chobani Simply 100; Yoplait 100 calorie Greek - No Fruit on the Bottom) ° °  °Vitamins   and Minerals • Start 1 day after surgery unless otherwise directed by your surgeon °• 2 Chewable Bariatric Specific Multivitamin / Multimineral Supplement with iron (Example: Bariatric Advantage Multi EA) °• Chewable Calcium with Vitamin D-3 °(Example: 3 Chewable Calcium Plus 600 with Vitamin D-3) °o Take 500 mg three (3) times a day for a total of 1500 mg each day °o Do not take all 3 doses of calcium at one time as it may cause constipation, and you can only absorb 500 mg  at a time  °o Do not mix multivitamins containing iron with calcium supplements; take 2 hours apart °• Menstruating women and those with a history of anemia (a blood disease that causes weakness) may need extra iron °o Talk with your doctor to see if you need more iron °• Do not stop taking or change any vitamins or minerals until you talk to your dietitian or surgeon °• Your Dietitian and/or surgeon must approve all vitamin and mineral supplements °  °Activity and Exercise: Limit your physical activity as instructed by your doctor.  It is important to continue walking at home.  During this time, use these guidelines: °• Do not lift anything greater than ten (10) pounds for at least two (2) weeks °• Do not go back to work or drive until your surgeon says you can °• You may have sex when you feel comfortable  °o It is  VERY important for female patients to use a reliable birth control method; fertility often increases after surgery  °o All hormonal birth control will be ineffective for 30 days after surgery due to medications given during surgery a barrier method must be used. °o Do not get pregnant for at least 18 months °• Start exercising as soon as your doctor tells you that you can °o Make sure your doctor approves any physical activity °• Start with a simple walking program °• Walk 5-15 minutes each day, 7 days per week.  °• Slowly increase until you are walking 30-45 minutes per day °Consider joining our BELT program. [phone] or email [email or web address] °  °Special Instructions Things to remember: °• Use your CPAP when sleeping if this applies to you ° °• [facility] has two free Bariatric Surgery Support Groups that meet monthly °o The 3rd Thursday of each month, 6 pm, [facility]  °o The 2nd Friday of each month, 11:45 am in the private dining room in the basement of [facility] °• It is very important to keep all follow up appointments with your surgeon, dietitian, primary care physician, and behavioral health practitioner °• Routine follow up schedule with your surgeon include appointments at 2-3 weeks, 6-8 weeks, 6 months, and 1 year at a minimum.  Your surgeon may request to see you more often.   °o After the first year, please follow up with your bariatric surgeon and dietitian at least once a year in order to maintain best weight loss results °[facility]: [phone] °[facility]: [phone] °Bariatric Nurse Coordinator: [phone] °  °   Reviewed and Endorsed  °by  Patient Education Committee, June, 2016 °Edits Approved: Aug, 2018 ° ° ° °

## 2017-07-16 NOTE — Progress Notes (Signed)
Started on water.

## 2017-07-16 NOTE — Anesthesia Postprocedure Evaluation (Signed)
Anesthesia Post Note  Patient: Brooke [name]  Procedure(s) Performed: LAPAROSCOPIC ROUX-EN-Y GASTRIC BYPASS WITH UPPER ENDOSCOPY AND ERAS PATHWAY (N/A Abdomen)     Patient location during evaluation: PACU Anesthesia Type: General Level of consciousness: sedated Pain management: pain level controlled Vital Signs Assessment: post-procedure vital signs reviewed and stable Respiratory status: spontaneous breathing and respiratory function stable Cardiovascular status: stable Postop Assessment: no apparent nausea or vomiting Anesthetic complications: no    Last Vitals:  Vitals:   07/16/17 1236 07/16/17 1304  BP: (!) 166/92 (!) 159/87  Pulse: 65 64  Resp: 18 16  Temp: (!) 36.1 C 36.6 C  SpO2: 100% 100%    Last Pain:  Vitals:   07/16/17 1236  TempSrc:   PainSc: Asleep                 Ashling Roane DANIEL

## 2017-07-16 NOTE — Progress Notes (Signed)
Ambulated from stretcher to chair and tolerated well

## 2017-07-16 NOTE — Op Note (Signed)
Preoperative diagnosis: Roux-en-Y gastric bypass  Postoperative diagnosis: Same   Procedure: Upper endoscopy   Surgeon: Gurney Maxin, M.D.  Anesthesia: Gen.   Indications for procedure: This patient was undergoing a Roux-en-Y gastric bypass.   Description of procedure: The endoscopy was placed in the mouth and into the oropharynx and under endoscopic vision it was advanced to the esophagogastric junction. The pouch was insufflated and no bleeding or bubbles were seen. The GEJ was identified at 38cm from the teeth. The anastomosis was identified at 45cm for a 6cm pouch. No bleeding or leaks were detected. The scope was withdrawn without difficulty.   Gurney Maxin, M.D. General, Bariatric, & Minimally Invasive Surgery [facility]

## 2017-07-17 ENCOUNTER — Encounter (HOSPITAL_COMMUNITY): Payer: Self-pay | Admitting: Surgery

## 2017-07-17 LAB — CBC WITH DIFFERENTIAL/PLATELET
BASOS ABS: 0 10*3/uL (ref 0.0–0.1)
Basophils Relative: 0 %
EOS PCT: 0 %
Eosinophils Absolute: 0 10*3/uL (ref 0.0–0.7)
HCT: 38.8 % (ref 36.0–46.0)
Hemoglobin: 12.6 g/dL (ref 12.0–15.0)
LYMPHS ABS: 1.6 10*3/uL (ref 0.7–4.0)
Lymphocytes Relative: 18 %
MCH: 30.6 pg (ref 26.0–34.0)
MCHC: 32.5 g/dL (ref 30.0–36.0)
MCV: 94.2 fL (ref 78.0–100.0)
MONO ABS: 0.8 10*3/uL (ref 0.1–1.0)
MONOS PCT: 8 %
Neutro Abs: 6.8 10*3/uL (ref 1.7–7.7)
Neutrophils Relative %: 74 %
PLATELETS: 359 10*3/uL (ref 150–400)
RBC: 4.12 MIL/uL (ref 3.87–5.11)
RDW: 13.6 % (ref 11.5–15.5)
WBC: 9.2 10*3/uL (ref 4.0–10.5)

## 2017-07-17 MED ORDER — BISACODYL 10 MG RE SUPP
10.0000 mg | Freq: Once | RECTAL | Status: AC
  Administered 2017-07-17: 10 mg via RECTAL
  Filled 2017-07-17: qty 1

## 2017-07-17 MED ORDER — METOPROLOL SUCCINATE ER 25 MG PO TB24
25.0000 mg | ORAL_TABLET | Freq: Every day | ORAL | Status: DC
  Administered 2017-07-17 – 2017-07-18 (×2): 25 mg via ORAL
  Filled 2017-07-17 (×2): qty 1

## 2017-07-17 MED ORDER — FLEET ENEMA 7-19 GM/118ML RE ENEM
1.0000 | ENEMA | Freq: Once | RECTAL | Status: AC | PRN
  Administered 2017-07-18: 1 via RECTAL
  Filled 2017-07-17: qty 1

## 2017-07-17 NOTE — Plan of Care (Signed)
Plan of care discussed with patient 

## 2017-07-17 NOTE — Progress Notes (Signed)
Patient with nausea requiring IV Zofran.  Slow to progress to protein minimal clear liquid fluids.  Ambulated x 1 this am, in chair for several hours.  Instructions given to patient to ambulate 6 x's per day and to sip fluids.  Discussed above with Dr Hassell Done.  Patient will not discharge today

## 2017-07-17 NOTE — Progress Notes (Signed)
Patient alert and oriented, Post op day 1.  Provided support and encouragement.  Encouraged pulmonary toilet, ambulation and small sips of liquids.  Completed 12 ounces of bari clear fluid and started on protein shake. All questions answered.  Will continue to monitor.

## 2017-07-18 LAB — CBC WITH DIFFERENTIAL/PLATELET
Basophils Absolute: 0 10*3/uL (ref 0.0–0.1)
Basophils Relative: 0 %
Eosinophils Absolute: 0 10*3/uL (ref 0.0–0.7)
Eosinophils Relative: 0 %
HEMATOCRIT: 38.6 % (ref 36.0–46.0)
Hemoglobin: 12.6 g/dL (ref 12.0–15.0)
LYMPHS ABS: 2 10*3/uL (ref 0.7–4.0)
Lymphocytes Relative: 25 %
MCH: 30.7 pg (ref 26.0–34.0)
MCHC: 32.6 g/dL (ref 30.0–36.0)
MCV: 93.9 fL (ref 78.0–100.0)
MONO ABS: 0.7 10*3/uL (ref 0.1–1.0)
Monocytes Relative: 8 %
NEUTROS ABS: 5.3 10*3/uL (ref 1.7–7.7)
Neutrophils Relative %: 67 %
PLATELETS: 344 10*3/uL (ref 150–400)
RBC: 4.11 MIL/uL (ref 3.87–5.11)
RDW: 13.9 % (ref 11.5–15.5)
WBC: 8.1 10*3/uL (ref 4.0–10.5)

## 2017-07-18 NOTE — Progress Notes (Signed)
Discharge instructions discussed with patient and family, verbalized agreement and understanding 

## 2017-07-18 NOTE — Progress Notes (Signed)
Patient alert and oriented, pain is controlled. Patient is tolerating fluids, advanced to protein shake today, patient is tolerating well. Reviewed Gastric Bypass discharge instructions with patient and patient is able to articulate understanding. Provided information on BELT program, Support Group and [facility] outpatient pharmacy. All questions answered, will continue to monitor.   Total fluid intake 720 Per dehydration protocol call back one week post op 

## 2017-07-18 NOTE — Discharge Summary (Signed)
Physician Discharge Summary  Patient ID: Brooke [name] MRN: [id number] DOB/AGE: [birth year] 47 y.o.  PCP: [name], MD  Admit date: 07/16/2017 Discharge date: 07/18/2017  Admission Diagnoses:  Morbid obesity  Discharge Diagnoses:  same  Principal Problem:   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   Surgery:  Lap roux en Y gastric bypass  Discharged Condition: improved  Hospital Course:   Had surgery on Tuesday.  Begun on liquid and advanced and ready for discharge on Thursday.   Consults: none  Significant Diagnostic Studies: none    Discharge Exam: Blood pressure 137/78, pulse (!) 59, temperature 98.7 F (37.1 C), temperature source Oral, resp. rate 16, height 5\' 3"  (1.6 m), weight 112.5 kg (248 lb 1.6 oz), last menstrual period 07/09/2017, SpO2 100 %. Incisions OK  Disposition: Discharge disposition: 01-Home or Self Care       Discharge Instructions    Ambulate hourly while awake   Complete by:  As directed    Ambulate hourly while awake   Complete by:  As directed    Call MD for:  difficulty breathing, headache or visual disturbances   Complete by:  As directed    Call MD for:  difficulty breathing, headache or visual disturbances   Complete by:  As directed    Call MD for:  persistant dizziness or light-headedness   Complete by:  As directed    Call MD for:  persistant dizziness or light-headedness   Complete by:  As directed    Call MD for:  persistant nausea and vomiting   Complete by:  As directed    Call MD for:  persistant nausea and vomiting   Complete by:  As directed    Call MD for:  redness, tenderness, or signs of infection (pain, swelling, redness, odor or green/yellow discharge around incision site)   Complete by:  As directed    Call MD for:  redness, tenderness, or signs of infection (pain, swelling, redness, odor or green/yellow discharge around incision site)   Complete by:  As directed    Call MD for:  severe uncontrolled pain    Complete by:  As directed    Call MD for:  severe uncontrolled pain   Complete by:  As directed    Call MD for:  temperature >101 F   Complete by:  As directed    Call MD for:  temperature >101 F   Complete by:  As directed    Diet bariatric full liquid   Complete by:  As directed    Diet bariatric full liquid   Complete by:  As directed    Incentive spirometry   Complete by:  As directed    Perform hourly while awake   Incentive spirometry   Complete by:  As directed    Perform hourly while awake     Allergies as of 07/18/2017   No Known Allergies     Medication List    STOP taking these medications   butalbital-aspirin-caffeine-codeine 50-325-40-30 MG capsule Commonly known as:  FIORINAL WITH CODEINE     TAKE these medications   acetaminophen 500 MG tablet Commonly known as:  TYLENOL Take 1,000 mg by mouth daily as needed for moderate pain or headache.   Armodafinil 200 MG Tabs Take 200 mg by mouth every morning.   Biotin 10000 MCG Tabs Take 10,000 mcg by mouth daily.   buPROPion 200 MG 12 hr tablet Commonly known as:  WELLBUTRIN SR Take 200  mg by mouth 2 (two) times daily.   CALCIUM PO Take 1 capsule by mouth daily.   ferrous sulfate 325 (65 FE) MG EC tablet Take 325 mg by mouth daily with breakfast.   FLUoxetine 40 MG capsule Commonly known as:  PROZAC Take 40 mg by mouth daily.   metoprolol succinate 25 MG 24 hr tablet Commonly known as:  TOPROL-XL Take 25 mg by mouth daily. Notes to patient:  Monitor Blood Pressure Daily and keep a log for primary care physician.  You may need to make changes to your medications with rapid weight loss.     modafinil 200 MG tablet Commonly known as:  PROVIGIL Take 1 tablet (200 mg total) by mouth daily.   omeprazole 40 MG capsule Commonly known as:  PRILOSEC Take 1 capsule (40 mg total) by mouth daily.   topiramate 100 MG tablet Commonly known as:  TOPAMAX Take 100 mg by mouth daily.   Vitamin D 2000 units  Caps Take 2,000 Units by mouth daily.      Follow-up Information    Surgery, [place]. Go on 07/31/2017.   Specialty:  General Surgery Why:  at 114[address] Contact information: [address] [place] [address] [phone]        Sur[facility], [place] .   Specialty:  General Surgery Contact information: [address] [place] [address] [phone]           Signed: Pedro Earls 07/18/2017, 10:48 AM

## 2017-07-20 ENCOUNTER — Observation Stay (HOSPITAL_COMMUNITY)
Admission: EM | Admit: 2017-07-20 | Discharge: 2017-07-21 | Disposition: A | Discharge status: Home / Self Care | Payer: BLUE CROSS/BLUE SHIELD | Attending: Surgery | Admitting: Surgery

## 2017-07-20 ENCOUNTER — Emergency Department (HOSPITAL_COMMUNITY): Payer: BLUE CROSS/BLUE SHIELD | Admitting: Certified Registered"

## 2017-07-20 ENCOUNTER — Encounter (HOSPITAL_COMMUNITY): Payer: Self-pay

## 2017-07-20 ENCOUNTER — Emergency Department (HOSPITAL_COMMUNITY): Payer: BLUE CROSS/BLUE SHIELD

## 2017-07-20 ENCOUNTER — Other Ambulatory Visit: Payer: Self-pay

## 2017-07-20 ENCOUNTER — Encounter (HOSPITAL_COMMUNITY)
Admission: EM | Disposition: A | Discharge status: Home / Self Care | Payer: Self-pay | Source: Home / Self Care | Attending: Emergency Medicine

## 2017-07-20 DIAGNOSIS — K56609 Unspecified intestinal obstruction, unspecified as to partial versus complete obstruction: Secondary | ICD-10-CM | POA: Diagnosis not present

## 2017-07-20 DIAGNOSIS — Z6841 Body Mass Index (BMI) 40.0 and over, adult: Secondary | ICD-10-CM | POA: Diagnosis not present

## 2017-07-20 DIAGNOSIS — K219 Gastro-esophageal reflux disease without esophagitis: Secondary | ICD-10-CM | POA: Insufficient documentation

## 2017-07-20 DIAGNOSIS — F419 Anxiety disorder, unspecified: Secondary | ICD-10-CM | POA: Diagnosis not present

## 2017-07-20 DIAGNOSIS — Z9884 Bariatric surgery status: Secondary | ICD-10-CM | POA: Insufficient documentation

## 2017-07-20 DIAGNOSIS — M5136 Other intervertebral disc degeneration, lumbar region: Secondary | ICD-10-CM | POA: Insufficient documentation

## 2017-07-20 DIAGNOSIS — K5900 Constipation, unspecified: Secondary | ICD-10-CM | POA: Insufficient documentation

## 2017-07-20 DIAGNOSIS — R1012 Left upper quadrant pain: Secondary | ICD-10-CM | POA: Diagnosis not present

## 2017-07-20 DIAGNOSIS — F329 Major depressive disorder, single episode, unspecified: Secondary | ICD-10-CM | POA: Insufficient documentation

## 2017-07-20 DIAGNOSIS — M5430 Sciatica, unspecified side: Secondary | ICD-10-CM | POA: Diagnosis not present

## 2017-07-20 DIAGNOSIS — G43909 Migraine, unspecified, not intractable, without status migrainosus: Secondary | ICD-10-CM | POA: Insufficient documentation

## 2017-07-20 DIAGNOSIS — K430 Incisional hernia with obstruction, without gangrene: Secondary | ICD-10-CM | POA: Diagnosis not present

## 2017-07-20 DIAGNOSIS — I10 Essential (primary) hypertension: Secondary | ICD-10-CM | POA: Diagnosis not present

## 2017-07-20 DIAGNOSIS — R002 Palpitations: Secondary | ICD-10-CM | POA: Insufficient documentation

## 2017-07-20 DIAGNOSIS — K449 Diaphragmatic hernia without obstruction or gangrene: Secondary | ICD-10-CM | POA: Insufficient documentation

## 2017-07-20 DIAGNOSIS — M5412 Radiculopathy, cervical region: Secondary | ICD-10-CM | POA: Diagnosis not present

## 2017-07-20 DIAGNOSIS — Z87891 Personal history of nicotine dependence: Secondary | ICD-10-CM | POA: Insufficient documentation

## 2017-07-20 DIAGNOSIS — D509 Iron deficiency anemia, unspecified: Secondary | ICD-10-CM | POA: Insufficient documentation

## 2017-07-20 DIAGNOSIS — M1990 Unspecified osteoarthritis, unspecified site: Secondary | ICD-10-CM | POA: Insufficient documentation

## 2017-07-20 DIAGNOSIS — R42 Dizziness and giddiness: Secondary | ICD-10-CM | POA: Insufficient documentation

## 2017-07-20 DIAGNOSIS — G4700 Insomnia, unspecified: Secondary | ICD-10-CM | POA: Diagnosis not present

## 2017-07-20 DIAGNOSIS — Z79899 Other long term (current) drug therapy: Secondary | ICD-10-CM | POA: Diagnosis not present

## 2017-07-20 DIAGNOSIS — F410 Panic disorder [episodic paroxysmal anxiety] without agoraphobia: Secondary | ICD-10-CM | POA: Diagnosis not present

## 2017-07-20 DIAGNOSIS — R1114 Bilious vomiting: Secondary | ICD-10-CM | POA: Diagnosis not present

## 2017-07-20 DIAGNOSIS — K460 Unspecified abdominal hernia with obstruction, without gangrene: Secondary | ICD-10-CM | POA: Diagnosis not present

## 2017-07-20 DIAGNOSIS — K56600 Partial intestinal obstruction, unspecified as to cause: Secondary | ICD-10-CM | POA: Diagnosis not present

## 2017-07-20 DIAGNOSIS — F418 Other specified anxiety disorders: Secondary | ICD-10-CM | POA: Diagnosis not present

## 2017-07-20 DIAGNOSIS — R1110 Vomiting, unspecified: Secondary | ICD-10-CM | POA: Diagnosis not present

## 2017-07-20 HISTORY — DX: Unspecified intestinal obstruction, unspecified as to partial versus complete obstruction: K56.609

## 2017-07-20 HISTORY — PX: LAPAROSCOPY: SHX197

## 2017-07-20 LAB — COMPREHENSIVE METABOLIC PANEL
ALBUMIN: 3.4 g/dL — AB (ref 3.5–5.0)
ALT: 37 U/L (ref 0–44)
AST: 28 U/L (ref 15–41)
Alkaline Phosphatase: 57 U/L (ref 38–126)
Anion gap: 11 (ref 5–15)
BUN: 11 mg/dL (ref 6–20)
CHLORIDE: 104 mmol/L (ref 98–111)
CO2: 23 mmol/L (ref 22–32)
CREATININE: 0.94 mg/dL (ref 0.44–1.00)
Calcium: 8.9 mg/dL (ref 8.9–10.3)
GFR calc Af Amer: >60 mL/min (ref >60)
GLUCOSE: 97 mg/dL (ref 70–99)
Potassium: 3.9 mmol/L (ref 3.5–5.1)
SODIUM: 138 mmol/L (ref 135–145)
Total Bilirubin: 0.7 mg/dL (ref 0.3–1.2)
Total Protein: 7.1 g/dL (ref 6.5–8.1)

## 2017-07-20 LAB — CBC WITH DIFFERENTIAL/PLATELET
Basophils Absolute: 0 10*3/uL (ref 0.0–0.1)
Basophils Relative: 0 %
EOS ABS: 0.1 10*3/uL (ref 0.0–0.7)
EOS PCT: 1 %
HCT: 41.8 % (ref 36.0–46.0)
Hemoglobin: 14 g/dL (ref 12.0–15.0)
LYMPHS PCT: 16 %
Lymphs Abs: 1.3 10*3/uL (ref 0.7–4.0)
MCH: 31.4 pg (ref 26.0–34.0)
MCHC: 33.5 g/dL (ref 30.0–36.0)
MCV: 93.7 fL (ref 78.0–100.0)
MONO ABS: 0.5 10*3/uL (ref 0.1–1.0)
MONOS PCT: 6 %
Neutro Abs: 6.4 10*3/uL (ref 1.7–7.7)
Neutrophils Relative %: 77 %
PLATELETS: 389 10*3/uL (ref 150–400)
RBC: 4.46 MIL/uL (ref 3.87–5.11)
RDW: 13.5 % (ref 11.5–15.5)
WBC: 8.2 10*3/uL (ref 4.0–10.5)

## 2017-07-20 LAB — URINALYSIS, ROUTINE W REFLEX MICROSCOPIC
BILIRUBIN URINE: NEGATIVE
Glucose, UA: NEGATIVE mg/dL
Hgb urine dipstick: NEGATIVE
Ketones, ur: 80 mg/dL — AB
Nitrite: NEGATIVE
PH: 6 (ref 5.0–8.0)
Protein, ur: NEGATIVE mg/dL
SPECIFIC GRAVITY, URINE: 1.021 (ref 1.005–1.030)

## 2017-07-20 LAB — LIPASE, BLOOD: LIPASE: 23 U/L (ref 11–51)

## 2017-07-20 LAB — I-STAT BETA HCG BLOOD, ED (MC, WL, AP ONLY)

## 2017-07-20 SURGERY — LAPAROSCOPY, DIAGNOSTIC
Anesthesia: General | Site: Abdomen

## 2017-07-20 MED ORDER — BUPIVACAINE-EPINEPHRINE 0.25% -1:200000 IJ SOLN
INTRAMUSCULAR | Status: DC | PRN
  Administered 2017-07-20: 30 mL

## 2017-07-20 MED ORDER — PROPOFOL 10 MG/ML IV BOLUS
INTRAVENOUS | Status: DC | PRN
  Administered 2017-07-20: 180 mg via INTRAVENOUS

## 2017-07-20 MED ORDER — SCOPOLAMINE 1 MG/3DAYS TD PT72
MEDICATED_PATCH | TRANSDERMAL | Status: DC | PRN
  Administered 2017-07-20: 1 via TRANSDERMAL

## 2017-07-20 MED ORDER — LIDOCAINE 2% (20 MG/ML) 5 ML SYRINGE
INTRAMUSCULAR | Status: DC | PRN
  Administered 2017-07-20: 100 mg via INTRAVENOUS

## 2017-07-20 MED ORDER — HEPARIN SODIUM (PORCINE) 5000 UNIT/ML IJ SOLN
5000.0000 [IU] | Freq: Three times a day (TID) | INTRAMUSCULAR | Status: DC
Start: 2017-07-20 — End: 2017-07-21
  Administered 2017-07-20 – 2017-07-21 (×2): 5000 [IU] via SUBCUTANEOUS
  Filled 2017-07-20 (×2): qty 1

## 2017-07-20 MED ORDER — IOPAMIDOL (ISOVUE-300) INJECTION 61%
100.0000 mL | Freq: Once | INTRAVENOUS | Status: AC | PRN
  Administered 2017-07-20: 100 mL via INTRAVENOUS

## 2017-07-20 MED ORDER — TOPIRAMATE 100 MG PO TABS
100.0000 mg | ORAL_TABLET | Freq: Every day | ORAL | Status: DC
  Administered 2017-07-21: 100 mg via ORAL
  Filled 2017-07-20: qty 1

## 2017-07-20 MED ORDER — PROPOFOL 10 MG/ML IV BOLUS
INTRAVENOUS | Status: AC
  Filled 2017-07-20: qty 20

## 2017-07-20 MED ORDER — SODIUM CHLORIDE 0.9 % IV SOLN
INTRAVENOUS | Status: AC
  Filled 2017-07-20: qty 2

## 2017-07-20 MED ORDER — LABETALOL HCL 5 MG/ML IV SOLN
INTRAVENOUS | Status: DC | PRN
  Administered 2017-07-20 (×2): 5 mg via INTRAVENOUS

## 2017-07-20 MED ORDER — SUGAMMADEX SODIUM 500 MG/5ML IV SOLN
INTRAVENOUS | Status: AC
  Filled 2017-07-20: qty 5

## 2017-07-20 MED ORDER — DEXAMETHASONE SODIUM PHOSPHATE 10 MG/ML IJ SOLN
INTRAMUSCULAR | Status: DC | PRN
  Administered 2017-07-20: 10 mg via INTRAVENOUS

## 2017-07-20 MED ORDER — ONDANSETRON 4 MG PO TBDP: 4.0000 mg | ORAL_TABLET | Freq: Three times a day (TID) | ORAL | Status: DC | PRN

## 2017-07-20 MED ORDER — MEPERIDINE HCL 50 MG/ML IJ SOLN: 6.2500 mg | INTRAMUSCULAR | Status: DC | PRN

## 2017-07-20 MED ORDER — ACETAMINOPHEN 500 MG PO TABS: 1000.0000 mg | ORAL_TABLET | Freq: Every day | ORAL | Status: DC | PRN

## 2017-07-20 MED ORDER — SUCCINYLCHOLINE CHLORIDE 200 MG/10ML IV SOSY
PREFILLED_SYRINGE | INTRAVENOUS | Status: DC | PRN
  Administered 2017-07-20: 140 mg via INTRAVENOUS

## 2017-07-20 MED ORDER — ONDANSETRON HCL 4 MG/2ML IJ SOLN
INTRAMUSCULAR | Status: DC | PRN
  Administered 2017-07-20: 4 mg via INTRAVENOUS

## 2017-07-20 MED ORDER — OXYCODONE HCL 5 MG/5ML PO SOLN: 5.0000 mg | Freq: Four times a day (QID) | ORAL | Status: DC | PRN

## 2017-07-20 MED ORDER — SODIUM CHLORIDE 0.9 % IV BOLUS
1000.0000 mL | Freq: Once | INTRAVENOUS | Status: AC
  Administered 2017-07-20: 1000 mL via INTRAVENOUS

## 2017-07-20 MED ORDER — BUPROPION HCL ER (SR) 100 MG PO TB12
200.0000 mg | ORAL_TABLET | Freq: Two times a day (BID) | ORAL | Status: DC
  Administered 2017-07-21: 200 mg via ORAL
  Filled 2017-07-20: qty 2

## 2017-07-20 MED ORDER — KCL IN DEXTROSE-NACL 20-5-0.45 MEQ/L-%-% IV SOLN
INTRAVENOUS | Status: DC
  Administered 2017-07-20 – 2017-07-21 (×2): via INTRAVENOUS
  Filled 2017-07-20 (×2): qty 1000

## 2017-07-20 MED ORDER — MORPHINE SULFATE (PF) 4 MG/ML IV SOLN
1.0000 mg | INTRAVENOUS | Status: DC | PRN
  Administered 2017-07-21 (×2): 2 mg via INTRAVENOUS
  Filled 2017-07-20 (×2): qty 1

## 2017-07-20 MED ORDER — PANTOPRAZOLE SODIUM 40 MG PO TBEC: 40.0000 mg | DELAYED_RELEASE_TABLET | Freq: Every day | ORAL | Status: DC

## 2017-07-20 MED ORDER — SODIUM CHLORIDE 0.9 % IV SOLN
INTRAVENOUS | Status: DC | PRN
  Administered 2017-07-20: 2 g via INTRAVENOUS

## 2017-07-20 MED ORDER — ACETAMINOPHEN 160 MG/5ML PO SOLN
650.0000 mg | Freq: Four times a day (QID) | ORAL | Status: DC
  Administered 2017-07-20 – 2017-07-21 (×2): 650 mg via ORAL
  Filled 2017-07-20 (×2): qty 20.3

## 2017-07-20 MED ORDER — OXYCODONE HCL 5 MG/5ML PO SOLN: 5.0000 mg | ORAL | Status: DC | PRN

## 2017-07-20 MED ORDER — LABETALOL HCL 5 MG/ML IV SOLN
INTRAVENOUS | Status: AC
  Filled 2017-07-20: qty 4

## 2017-07-20 MED ORDER — FLUOXETINE HCL 20 MG PO CAPS
40.0000 mg | ORAL_CAPSULE | Freq: Every day | ORAL | Status: DC
  Administered 2017-07-21: 40 mg via ORAL
  Filled 2017-07-20: qty 2

## 2017-07-20 MED ORDER — PANTOPRAZOLE SODIUM 40 MG PO TBEC
40.0000 mg | DELAYED_RELEASE_TABLET | Freq: Every day | ORAL | Status: DC
  Administered 2017-07-21: 40 mg via ORAL
  Filled 2017-07-20: qty 1

## 2017-07-20 MED ORDER — SCOPOLAMINE 1 MG/3DAYS TD PT72
MEDICATED_PATCH | TRANSDERMAL | Status: AC
  Filled 2017-07-20: qty 1

## 2017-07-20 MED ORDER — METOPROLOL SUCCINATE ER 25 MG PO TB24
25.0000 mg | ORAL_TABLET | Freq: Every day | ORAL | Status: DC
  Administered 2017-07-20 – 2017-07-21 (×2): 25 mg via ORAL
  Filled 2017-07-20 (×2): qty 1

## 2017-07-20 MED ORDER — ONDANSETRON HCL 4 MG/2ML IJ SOLN
INTRAMUSCULAR | Status: AC
  Filled 2017-07-20: qty 2

## 2017-07-20 MED ORDER — ROCURONIUM BROMIDE 10 MG/ML (PF) SYRINGE
PREFILLED_SYRINGE | INTRAVENOUS | Status: AC
  Filled 2017-07-20: qty 10

## 2017-07-20 MED ORDER — ONDANSETRON HCL 4 MG/2ML IJ SOLN
4.0000 mg | Freq: Once | INTRAMUSCULAR | Status: AC
  Administered 2017-07-20: 4 mg via INTRAVENOUS
  Filled 2017-07-20: qty 2

## 2017-07-20 MED ORDER — MIDAZOLAM HCL 2 MG/2ML IJ SOLN
INTRAMUSCULAR | Status: AC
  Filled 2017-07-20: qty 2

## 2017-07-20 MED ORDER — LACTATED RINGERS IV SOLN
INTRAVENOUS | Status: AC | PRN
  Administered 2017-07-20: 1000 mL

## 2017-07-20 MED ORDER — FENTANYL CITRATE (PF) 100 MCG/2ML IJ SOLN
INTRAMUSCULAR | Status: DC | PRN
  Administered 2017-07-20: 100 ug via INTRAVENOUS

## 2017-07-20 MED ORDER — ONDANSETRON HCL 4 MG/2ML IJ SOLN: 4.0000 mg | INTRAMUSCULAR | Status: DC | PRN

## 2017-07-20 MED ORDER — FENTANYL CITRATE (PF) 100 MCG/2ML IJ SOLN: 25.0000 ug | INTRAMUSCULAR | Status: DC | PRN

## 2017-07-20 MED ORDER — MORPHINE SULFATE (PF) 4 MG/ML IV SOLN
4.0000 mg | Freq: Once | INTRAVENOUS | Status: AC
  Administered 2017-07-20: 4 mg via INTRAVENOUS
  Filled 2017-07-20: qty 1

## 2017-07-20 MED ORDER — PREMIER PROTEIN SHAKE
2.0000 [oz_av] | ORAL | Status: DC
  Administered 2017-07-21 (×2): 2 [oz_av] via ORAL

## 2017-07-20 MED ORDER — ROCURONIUM BROMIDE 10 MG/ML (PF) SYRINGE
PREFILLED_SYRINGE | INTRAVENOUS | Status: DC | PRN
  Administered 2017-07-20: 30 mg via INTRAVENOUS

## 2017-07-20 MED ORDER — LIDOCAINE 2% (20 MG/ML) 5 ML SYRINGE
INTRAMUSCULAR | Status: AC
  Filled 2017-07-20: qty 5

## 2017-07-20 MED ORDER — LACTATED RINGERS IV SOLN: INTRAVENOUS | Status: DC

## 2017-07-20 MED ORDER — SUGAMMADEX SODIUM 500 MG/5ML IV SOLN
INTRAVENOUS | Status: DC | PRN
  Administered 2017-07-20: 225 mg via INTRAVENOUS

## 2017-07-20 MED ORDER — IOPAMIDOL (ISOVUE-300) INJECTION 61%
INTRAVENOUS | Status: AC
  Filled 2017-07-20: qty 100

## 2017-07-20 MED ORDER — 0.9 % SODIUM CHLORIDE (POUR BTL) OPTIME
TOPICAL | Status: DC | PRN
  Administered 2017-07-20: 1000 mL

## 2017-07-20 MED ORDER — FENTANYL CITRATE (PF) 100 MCG/2ML IJ SOLN
INTRAMUSCULAR | Status: AC
  Filled 2017-07-20: qty 2

## 2017-07-20 MED ORDER — METOCLOPRAMIDE HCL 5 MG/ML IJ SOLN: 10.0000 mg | Freq: Once | INTRAMUSCULAR | Status: DC | PRN

## 2017-07-20 MED ORDER — MIDAZOLAM HCL 5 MG/5ML IJ SOLN
INTRAMUSCULAR | Status: DC | PRN
  Administered 2017-07-20: 2 mg via INTRAVENOUS

## 2017-07-20 MED ORDER — LACTATED RINGERS IV SOLN
INTRAVENOUS | Status: DC | PRN
  Administered 2017-07-20 (×2): via INTRAVENOUS

## 2017-07-20 MED ORDER — DEXAMETHASONE SODIUM PHOSPHATE 10 MG/ML IJ SOLN
INTRAMUSCULAR | Status: AC
  Filled 2017-07-20: qty 1

## 2017-07-20 MED ORDER — SUCCINYLCHOLINE CHLORIDE 200 MG/10ML IV SOSY
PREFILLED_SYRINGE | INTRAVENOUS | Status: AC
  Filled 2017-07-20: qty 10

## 2017-07-20 MED ORDER — BUPIVACAINE-EPINEPHRINE (PF) 0.25% -1:200000 IJ SOLN
INTRAMUSCULAR | Status: AC
  Filled 2017-07-20: qty 30

## 2017-07-20 SURGICAL SUPPLY — 27 items
ADH SKN CLS APL DERMABOND .7 (GAUZE/BANDAGES/DRESSINGS) ×1
APL SKNCLS STERI-STRIP NONHPOA (GAUZE/BANDAGES/DRESSINGS)
BENZOIN TINCTURE PRP APPL 2/3 (GAUZE/BANDAGES/DRESSINGS) IMPLANT
CABLE HIGH FREQUENCY MONO STRZ (ELECTRODE) IMPLANT
CHLORAPREP W/TINT 26ML (MISCELLANEOUS) ×2 IMPLANT
COVER SURGICAL LIGHT HANDLE (MISCELLANEOUS) ×2 IMPLANT
DECANTER SPIKE VIAL GLASS SM (MISCELLANEOUS) IMPLANT
DERMABOND ADVANCED (GAUZE/BANDAGES/DRESSINGS) ×1
DERMABOND ADVANCED .7 DNX12 (GAUZE/BANDAGES/DRESSINGS) IMPLANT
ELECT REM PT RETURN 15FT ADLT (MISCELLANEOUS) ×2 IMPLANT
GLOVE SURG SIGNA 7.5 PF LTX (GLOVE) ×2 IMPLANT
GOWN STRL REUS W/TWL XL LVL3 (GOWN DISPOSABLE) ×6 IMPLANT
IRRIG SUCT STRYKERFLOW 2 WTIP (MISCELLANEOUS)
IRRIGATION SUCT STRKRFLW 2 WTP (MISCELLANEOUS) IMPLANT
KIT BASIN OR (CUSTOM PROCEDURE TRAY) ×2 IMPLANT
SET IRRIG TUBING LAPAROSCOPIC (IRRIGATION / IRRIGATOR) ×2 IMPLANT
SHEARS HARMONIC ACE PLUS 36CM (ENDOMECHANICALS) IMPLANT
SLEEVE XCEL OPT CAN 5 100 (ENDOMECHANICALS) ×2 IMPLANT
STRIP CLOSURE SKIN 1/2X4 (GAUZE/BANDAGES/DRESSINGS) IMPLANT
SUT MNCRL AB 4-0 PS2 18 (SUTURE) ×2 IMPLANT
TOWEL OR 17X26 10 PK STRL BLUE (TOWEL DISPOSABLE) ×2 IMPLANT
TOWEL OR NON WOVEN STRL DISP B (DISPOSABLE) ×2 IMPLANT
TRAY FOLEY MTR SLVR 16FR STAT (SET/KITS/TRAYS/PACK) IMPLANT
TRAY LAPAROSCOPIC (CUSTOM PROCEDURE TRAY) ×2 IMPLANT
TROCAR BLADELESS OPT 5 100 (ENDOMECHANICALS) ×2 IMPLANT
TROCAR XCEL BLUNT TIP 100MML (ENDOMECHANICALS) IMPLANT
TROCAR XCEL NON-BLD 11X100MML (ENDOMECHANICALS) ×2 IMPLANT

## 2017-07-20 NOTE — Transfer of Care (Signed)
Immediate Anesthesia Transfer of Care Note  Patient: Brooke [name]  Procedure(s) Performed: LAPAROSCOPY DIAGNOSTIC. REDUCTION OF SMALL BOWEL OBSTRUCTION. REPAIR OF TROCAR HERNIA. (N/A Abdomen)  Patient Location: PACU  Anesthesia Type:General  Level of Consciousness: awake, alert  and oriented  Airway & Oxygen Therapy: Patient Spontanous Breathing and Patient connected to face mask oxygen  Post-op Assessment: Report given to RN and Post -op Vital signs reviewed and stable  Post vital signs: Reviewed and stable  Last Vitals:  Vitals Value Taken Time  BP    Temp    Pulse 84 07/20/2017  2:18 PM  Resp    SpO2 100 % 07/20/2017  2:18 PM  Vitals shown include unvalidated device data.  Last Pain:  Vitals:   07/20/17 1206  TempSrc:   PainSc: 2          Complications: No apparent anesthesia complications

## 2017-07-20 NOTE — Anesthesia Procedure Notes (Signed)
Procedure Name: Intubation Date/Time: 07/20/2017 1:14 PM Performed by: Alexiana Laverdure D, CRNA Pre-anesthesia Checklist: Patient identified, Emergency Drugs available, Suction available and Patient being monitored Patient Re-evaluated:Patient Re-evaluated prior to induction Oxygen Delivery Method: Circle system utilized Preoxygenation: Pre-oxygenation with 100% oxygen Induction Type: IV induction, Rapid sequence and Cricoid Pressure applied Laryngoscope Size: Mac and 4 Grade View: Grade I Tube type: Oral Number of attempts: 1 Airway Equipment and Method: Stylet Placement Confirmation: ETT inserted through vocal cords under direct vision,  positive ETCO2 and breath sounds checked- equal and bilateral Secured at: 21 cm Tube secured with: Tape Dental Injury: Teeth and Oropharynx as per pre-operative assessment

## 2017-07-20 NOTE — Anesthesia Postprocedure Evaluation (Signed)
Anesthesia Post Note  Patient: Brooke [name]  Procedure(s) Performed: LAPAROSCOPY DIAGNOSTIC. REDUCTION OF SMALL BOWEL OBSTRUCTION. REPAIR OF TROCAR HERNIA. (N/A Abdomen)     Patient location during evaluation: PACU Anesthesia Type: General Level of consciousness: awake and alert Pain management: pain level controlled Vital Signs Assessment: post-procedure vital signs reviewed and stable Respiratory status: spontaneous breathing, nonlabored ventilation, respiratory function stable and patient connected to nasal cannula oxygen Cardiovascular status: blood pressure returned to baseline and stable Postop Assessment: no apparent nausea or vomiting Anesthetic complications: no    Last Vitals:  Vitals:   07/20/17 1731 07/20/17 1854  BP: (!) 144/70 136/72  Pulse: 67 65  Resp: (!) 22 20  Temp: 36.5 C   SpO2: 100% 97%    Last Pain:  Vitals:   07/20/17 1731  TempSrc: Oral  PainSc:                  Montez Hageman

## 2017-07-20 NOTE — ED Provider Notes (Signed)
[facility] DEPT Provider Note   CSN: [id number] Arrival date & time: 07/20/17  3790     History   Chief Complaint Chief Complaint  Patient presents with  . Post-op Problem    HPI Brooke [name] is a 47 y.o. female who recently underwent a gastric bypass surgery by Dr. Hassell Done on 07/16/2017 who is presenting here today with left upper abdominal pain, nausea and vomiting as well as constipation.  Patient reports that she was discharged home on Thursday, 7/18 in good condition.  She reports that she was able to advance a liquid diet and had a small bowel movement after an enema.  She reports after returning home later that evening however she started developing some nausea as well as a constant, dull/achy left upper quadrant abdominal pain is been persistent since onset.  She reports since that time she has 3 episodes of bright green, nonbloody emesis with her last episode approximately 10 minutes ago.  She reports she has been using her home Zofran for this without any relief.  Patient reports that her abdominal pain is dull, achy in nature, constant and worse with movement as well as palpation.  She has tried 650 mg of Tylenol for her symptoms without any relief.  Patient denies any NSAID use or steroid use.  She is been trying to advance her diet with soups as well as protein shakes but has not been able to tolerate this.  She denies trying whole foods.  Patient denies any regurgitation, burping or belching.  She reports she has not had a bowel movement since Thursday at time of discharge.  She has tried milk of magnesium x1 for this without any relief.  Patient denies any fever, chills, urinary symptoms, lower abdominal pain, back pain, flank pain, diarrhea.  Patient denies any chest pain, shortness breath, cough or hemoptysis.  She has not had any lower leg swelling.  She reports she is not passing gas.  HPI  Past Medical History:  Diagnosis Date  . Acid reflux    . Anxiety   . Cervical radiculitis   . Chronic constipation   . DDD (degenerative disc disease), lumbar   . Depression   . Dizzy spells   . Facial numbness   . Heart palpitations 01/2017  . History of hiatal hernia   . Hypertension   . Insomnia   . Iron deficiency anemia   . Migraines    occ  . Near syncope 01/2017  . Numbness and tingling in left arm   . Obesity   . Panic attacks   . Sciatica     Patient Active Problem List   Diagnosis Date Noted  . 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 Hypersomnia due to another medical condition 06/12/2017  . Spells of speech arrest 01/23/2017  . Intractable migraine with visual aura and without status migrainosus 01/23/2017  . Sinus congestion 01/23/2017  . Morbid obesity with body mass index (BMI) of 40.0 to 44.9 in adult [facility]) 01/23/2017  . Sleep deprivation 01/23/2017  . Word finding difficulty 01/23/2017  . Morbid obesity ([place]) 12/03/2012  . LUQ pain 11/19/2012  . Esophageal dysphagia 11/19/2012  . Other malaise and fatigue 05/19/2012  . Headache(784.0) 04/01/2012  . Amenorrhea 02/06/2012  . Depression 02/06/2012  . Panic attacks 02/06/2012  . Sinusitis 01/03/2012  . ANEMIA, IRON DEFICIENCY 05/05/2009  . NICOTINE ADDICTION 03/04/2009  . GERD 03/04/2009  . CONSTIPATION, CHRONIC 03/04/2009    Past  Surgical History:  Procedure Laterality Date  . BUNIONECTOMY Left yrs ago  . COLONOSCOPY, ESOPHAGOGASTRODUODENOSCOPY (EGD) AND ESOPHAGEAL DILATION N/A 12/03/2012   QQV:ZDGLOVFI melanosis throughout the entire examined colon/The colon IS redundant/Small internal hemorrhoids/EGD:Esophageal web/Medium sized hiatal hernia/MILD Non-erosive gastritis  . ESOPHAGOGASTRODUODENOSCOPY  03/09/09   Dr. Wilford Corner, normal EGD, s/p Bravo capsule placement  . GASTRIC ROUX-EN-Y N/A 07/16/2017   Procedure: LAPAROSCOPIC ROUX-EN-Y GASTRIC BYPASS WITH UPPER ENDOSCOPY AND ERAS PATHWAY;  Surgeon: Johnathan Hausen, MD;  Location: [facility]  [facility];  Service: General;  Laterality: N/A;  . TUBAL LIGATION    . WISDOM TOOTH EXTRACTION       OB History   None      Home Medications    Prior to Admission medications   Medication Sig Start Date End Date Taking? Authorizing Provider  acetaminophen (TYLENOL) 500 MG tablet Take 1,000 mg by mouth daily as needed for moderate pain or headache.   Yes [provider]  Biotin 10000 MCG TABS Take 10,000 mcg by mouth daily.   Yes [provider]  buPROPion (WELLBUTRIN SR) 200 MG 12 hr tablet Take 200 mg by mouth 2 (two) times daily. 07/18/16  Yes [provider]  CALCIUM PO Take 1 capsule by mouth daily.   Yes [provider]  Cholecalciferol (VITAMIN D) 2000 units CAPS Take 2,000 Units by mouth daily.   Yes [provider]  ferrous sulfate 325 (65 FE) MG EC tablet Take 325 mg by mouth daily with breakfast.   Yes [provider]  FLUoxetine (PROZAC) 40 MG capsule Take 40 mg by mouth daily.  08/24/16  Yes [provider]  metoprolol succinate (TOPROL-XL) 25 MG 24 hr tablet Take 25 mg by mouth daily.  09/27/16  Yes [provider]  ondansetron (ZOFRAN-ODT) 4 MG disintegrating tablet Take 4 mg by mouth every 8 (eight) hours as needed for nausea/vomiting. 07/03/17  Yes [provider]  oxyCODONE (ROXICODONE) 5 MG/5ML solution Take 5 mg by mouth every 6 (six) hours as needed for moderate pain.  07/05/17  Yes [provider]  pantoprazole (PROTONIX) 40 MG tablet Take 40 mg by mouth daily. 07/03/17  Yes [provider]  topiramate (TOPAMAX) 100 MG tablet Take 100 mg by mouth daily.   Yes [provider]  Armodafinil 200 MG TABS Take 200 mg by mouth every morning. Patient not taking: Reported on 07/05/2017 06/13/17   Dohmeier, Asencion Partridge, MD  modafinil (PROVIGIL) 200 MG tablet Take 1 tablet (200 mg total) by mouth daily. Patient not taking: Reported on 07/05/2017 06/12/17   Dohmeier, Asencion Partridge, MD  omeprazole  (PRILOSEC) 40 MG capsule Take 1 capsule (40 mg total) by mouth daily. Patient not taking: Reported on 07/20/2017 01/03/12   Alycia Rossetti, MD    Family History Family History  Problem Relation Age of Onset  . Diabetes Mother   . Hypertension Mother   . Hypertension Father   . Diabetes Sister   . Hypertension Sister   . Hypertension Brother   . Diabetes Paternal Grandmother   . Hypertension Brother   . Hypertension Brother   . Cancer Maternal Grandmother        breast  . Asthma Other   . Heart disease Other   . Colon cancer Neg Hx     Social History Social History   Tobacco Use  . Smoking status: Former Smoker    Packs/day: 0.30    Years: 23.00    Pack years: 6.90    Types:  Cigarettes  . Smokeless tobacco: Never Used  . Tobacco comment: less than 1/2 pack cigarettes daily  Substance Use Topics  . Alcohol use: Yes    Comment: rare  . Drug use: No     Allergies   Patient has no known allergies.   Review of Systems Review of Systems  All other systems reviewed and are negative.    Physical Exam Updated Vital Signs BP 134/88 (BP Location: Right Arm)   Pulse 91   Temp 98.3 F (36.8 C) (Oral)   Resp 18   Ht 5\' 3"  (1.6 m)   Wt 112.5 kg (248 lb)   LMP 07/09/2017   SpO2 97%   BMI 43.93 kg/m   Physical Exam  Constitutional: She appears well-developed and well-nourished.  HENT:  Head: Normocephalic and atraumatic.  Right Ear: External ear normal.  Left Ear: External ear normal.  Nose: Nose normal.  Mouth/Throat: Uvula is midline, oropharynx is clear and moist and mucous membranes are normal. No tonsillar exudate.  Eyes: Pupils are equal, round, and reactive to light. Right eye exhibits no discharge. Left eye exhibits no discharge. No scleral icterus.  Neck: Trachea normal. Neck supple. No spinous process tenderness present. No neck rigidity. Normal range of motion present.  Cardiovascular: Normal rate, regular rhythm and intact distal pulses.  No  murmur heard. Pulses:      Radial pulses are 2+ on the right side, and 2+ on the left side.       Dorsalis pedis pulses are 2+ on the right side, and 2+ on the left side.       Posterior tibial pulses are 2+ on the right side, and 2+ on the left side.  No lower extremity swelling or edema. Calves symmetric in size bilaterally.  Pulmonary/Chest: Effort normal and breath sounds normal. She exhibits no tenderness.  Abdominal: Soft. She exhibits no distension. Bowel sounds are decreased. There is tenderness in the left upper quadrant. There is no rigidity, no rebound, no guarding and no CVA tenderness.  6 laparoscopic incisional scars that appear well healing without surrounding evidence of infection.  Dermabond overlying  Musculoskeletal: She exhibits no edema.  Lymphadenopathy:    She has no cervical adenopathy.  Neurological: She is alert.  Skin: Skin is warm and dry. No rash noted. She is not diaphoretic.  Psychiatric: She has a normal mood and affect.  Nursing note and vitals reviewed.    ED Treatments / Results  Labs (all labs ordered are listed, but only abnormal results are displayed) Labs Reviewed  COMPREHENSIVE METABOLIC PANEL - Abnormal; Notable for the following components:      Result Value   Albumin 3.4 (*)    All other components within normal limits  URINALYSIS, ROUTINE W REFLEX MICROSCOPIC - Abnormal; Notable for the following components:   APPearance CLOUDY (*)    Ketones, ur 80 (*)    Leukocytes, UA MODERATE (*)    Bacteria, UA RARE (*)    All other components within normal limits  CBC WITH DIFFERENTIAL/PLATELET  LIPASE, BLOOD  I-STAT BETA HCG BLOOD, ED ([facility], [facility], [facility] ONLY)    EKG None  Radiology Ct Abdomen Pelvis W Contrast  Result Date: 07/20/2017 CLINICAL DATA:  Recent bariatric surgery 4 days ago. Persistent diaphoresis with vomiting and nausea. EXAM: CT ABDOMEN AND PELVIS WITH CONTRAST TECHNIQUE: Multidetector CT imaging of the abdomen and pelvis was  performed using the standard protocol following bolus administration of intravenous contrast. CONTRAST:  14mL ISOVUE-300 IOPAMIDOL (ISOVUE-300) INJECTION  61% COMPARISON:  None. FINDINGS: Lower chest: 3 mm nodule over the right lower lobe. Hepatobiliary: Normal. Pancreas: Normal. Spleen: Normal. Adrenals/Urinary Tract: Adrenal glands are normal. Kidneys are normal in size without hydronephrosis or nephrolithiasis. Ureters and bladder are within normal. Stomach/Bowel: Evidence patient's recent gastric bypass surgery with gastrojejunostomy. There is dilatation of the proximal jejunum just distal to the gastrojejunostomy measuring 4 cm in diameter as the site of obstruction is over the left lower quadrant at a fascial defect from a left-sided ventral hernia. There is narrowing of this jejunal loops as it crosses the fascial defect as there is a short segment of jejunum within the hernia sac. Small bowel distal to the hernia sac is within normal. Air is present along the left anterior abdominal wall from the upper abdomen to the lower quadrant/pelvis likely related patient's recent surgery. There are several small collections of air within the subcutaneous fat lateral to the left-sided ventral hernia likely postsurgical and much less likely due to perforation of the herniated small bowel segment. Suture line over a small bowel loop over the left mid abdomen. Appendix is normal.  Colon is normal. Vascular/Lymphatic: Normal. Reproductive: Within normal. Other: Stranding of the subcutaneous fat over the left mid to lower abdomen likely postsurgical. No significant free peritoneal fluid. Musculoskeletal: Within normal. IMPRESSION: Evidence of patient's recent gastric bypass surgery. There is a proximal small bowel obstruction with transition point at the fascial defect of a left-sided ventral hernia. There is a short segment of small bowel within the hernia sac. There is air over the left abdominal wall likely related to  patient's recent surgery and much less likely due to perforation of the herniated small bowel segment. Electronically Signed   By: Marin Olp M.D.   On: 07/20/2017 10:43    Procedures Procedures (including critical care time)  Medications Ordered in ED Medications  sodium chloride 0.9 % bolus 1,000 mL (1,000 mLs Intravenous New Bag/Given 07/20/17 1003)  iopamidol (ISOVUE-300) 61 % injection (has no administration in time range)  ondansetron (ZOFRAN) injection 4 mg (4 mg Intravenous Given 07/20/17 1003)  morphine 4 MG/ML injection 4 mg (4 mg Intravenous Given 07/20/17 1003)  iopamidol (ISOVUE-300) 61 % injection 100 mL (100 mLs Intravenous Contrast Given 07/20/17 1009)     Initial Impression / Assessment and Plan / ED Course  I have reviewed the triage vital signs and the nursing notes.  Pertinent labs & imaging results that were available during my care of the patient were reviewed by me and considered in my medical decision making (see chart for details).     47 y.o. female who recently underwent a gastric bypass surgery by Dr. Hassell Done on 07/16/2017 who is presenting here today with left upper abdominal pain, nausea and vomiting as well as constipation.  Patient reports that she was discharged home on Thursday, 7/18 in good condition.  She reports that she was able to advance a liquid diet and had a small bowel movement after an enema. Since returning home she has had constant left upper quadrant abdominal pain not relieved by Tylenol, nausea/vomiting not relieved by Zofran as well as constipation not relieved by milk of magnesium.  Patient's vital signs are reassuring on presentation.  She does report that her emesis is bright green in nature.  She is with abdominal tenderness palpation in the left upper quadrant without peritoneal signs.  Given the patient is with recent surgery, bright green emesis & not passing gas, there is concern for obstruction.  Will obtain CT scan.  Will obtain lab  work to correlate.  Patient made n.p.o.  IV fluid, nausea and pain medication given.  Pregnancy test is negative.  Patient without leukocytosis.  No anemia.  No significant electrolyte derangements.  No acute kidney injury.  No anion gap acidosis.  Lipase within normal limits.  Urinalysis without evidence of UTI and patient is not complaining of any urinary symptoms..  There is moderate leukocytes.  There is only 0-5 WBC.  Urine culture was sent.  CT scan shows proximal small bowel obstruction with transition point at the fascial defect of left-sided ventral hernia, with a short segment of small bowel within the hernia sac.  There is other postsurgical changes noted that reported to be less likely due to perforation or herniated small bowel segment.  Patient updated on this and will consult general surgery.  11:23 AM Dr. Lucia Gaskins advised to keep the patient NPO. Patient will be going to the OR. He will see the patient within the hour.    Final Clinical Impressions(s) / ED Diagnoses   Final diagnoses:  Small bowel obstruction [facility])  History of gastric bypass    ED Discharge Orders    None       Lorelle Gibbs 07/20/17 1257    Little, Wenda Overland, MD 07/20/17 1540

## 2017-07-20 NOTE — Op Note (Signed)
07/20/2017  2:13 PM  PATIENT:  Brooke [name], 47 y.o., female, MRN: [id number]  PREOP DIAGNOSIS:  left upper quadrant abdominal hernia and small bowel obstruction.   POSTOP DIAGNOSIS:   Left upper quadrant abdominal hernia (at left upper quadrant trocar site) and small bowel obstruction.   PROCEDURE:   Procedure(s): LAPAROSCOPY DIAGNOSTIC. REDUCTION OF SMALL BOWEL OBSTRUCTION. REPAIR OF TROCAR HERNIA. [photos in paper chart]  SURGEON:   Alphonsa Overall, M.D.  ASSISTANT:   None  ANESTHESIA:   general  Anesthesiologist: Montez Hageman, MD CRNA: Marijo Conception, CRNA  General  EBL:  minimal  ml  BLOOD ADMINISTERED: none  DRAINS: none   LOCAL MEDICATIONS USED:   30 cc of 1/4% marcaine  SPECIMEN:   none  COUNTS CORRECT:  YES  INDICATIONS FOR PROCEDURE:  Brooke [name] is a 47 y.o. (DOB: 02-Sep-1970) AA female whose primary care physician is [name], MD and comes for laparoscopic exploration.  She had a RYGB by Dr. Hassell Done on 07/16/2017.  She presented with a SBO thought to be secondary to a defect in the LUQ trocar site.   The indications and risks of the surgery were explained to the patient.  The risks include, but are not limited to, infection, bleeding, and nerve injury.  PROCEDURE: The patient was taken to OR room #1 at [facility].  The old Dermabond was removed from the skin, her abdomen was prepped with ChloraPrep, and her abdomen was sterilely dressed.  A timeout was held the surgical check list 1.  I access to the abdominal cavity through her old trocar sites.  I placed a 10 mm trocar in the right midabdomen, a 5 mm trocar in the right lateral abdomen, and a 5 mm trocar to the right below the umbilicus.  I followed the previous trocar sites from the surgery she had on 16 July 2017.  On exploration, she had dilated small bowel involving her gastrojejunotomy limb of her gastric bypass.  This lead to a loop of small bowel trapped in the trocar site in  the left upper quadrant.  I was able to reduce the small bowel into the abdominal cavity. I took photos of this.  The rest of her bowel looked okay.  I did not try to expose the gastric pouch or any anastomosis.   The small bowel looked beat up, but viable.  I then closed the trocar site with two 0 Vicryl sutures using Endo Close.  I  irrigated the abdomen with 600 cc of saline.  I gave the small bowel some 15 minutes to recover and then inspected it.  The small bowel looked viable.  I removed the trochars in turn.  I irrigated each trocar site out with saline.  I infiltrated 30 cc of quarter percent Marcaine in the trocar sites.  I closed the trocar site with 4-0 Monocryl suture and pain with Dermabond.  Sponge count and needle count were correct.  She was transferred to recovery in good condition.

## 2017-07-20 NOTE — ED Triage Notes (Signed)
Pt states bariatric surgery Tuesday, d/c Thursday. Pt states since then , she has been sweating, vomiting x 2 (water), nausea. Denies diarrhea.

## 2017-07-20 NOTE — H&P (Signed)
Re:   Brooke [name] DOB:   11/16/1970 MRN:   [id number]  Chief Complaint vomiting  ASSESEMENT AND PLAN: 1.  SBO possible secondary to trocar site hernia in LUQ  Will need laparoscopic (possible open) exploration today.  Discussed the indications/risks of the surgery with the patient.  Risks include infection, bleeding, bowel injury or resection and open surgery.  2.  Recent RYGB - 07/16/2017 - [facility] 3.  Morbid obesity  BMI - 44 4.  GERD 5.  HTN 6.  History of dizziness - followed by Dr. Dolhmier 7.  Disk injury in back from [place] - May 2018   Sees Dr. Ron Agee  Chief Complaint  Patient presents with  . Post-op Problem   PHYSICIAN REQUESTING CONSULTATION: [name], MD  HISTORY OF PRESENT ILLNESS: Brooke [name] is a 47 y.o. (DOB: 1970-01-18)  AA female whose primary care physician is [name], MD and comes to the [facility] ER with left sided abdominal pain and vomiting.  Her husband is in the waiting area.   She had a gastric bypass by Dr. Hassell Done on 07/16/2017. She did well and went home on 07/18/2017. Over the last 24 hours, she has had trouble with vomiting and left upper quadrant abdominal pain. She returned to the [facility] emergency room today.  CT scan of abdomen - 07/20/2017 - Evidence of patient's recent gastric bypass surgery. There is a proximal small bowel obstruction with transition point at the fascial defect of a left-sided ventral hernia. There is a short segment of small bowel within the hernia sac. There is air over the left abdominal wall likely related to patient's recent surgery and much less likely due to perforation of the herniated small bowel segment.  WBC - 8,200 - 07/20/2017   Past Medical History:  Diagnosis Date  . Acid reflux   . Anxiety   . Cervical radiculitis   . Chronic constipation   . DDD (degenerative disc disease), lumbar   . Depression   . Dizzy spells   . Facial numbness   . Heart palpitations 01/2017  . History of hiatal hernia    . Hypertension   . Insomnia   . Iron deficiency anemia   . Migraines    occ  . Near syncope 01/2017  . Numbness and tingling in left arm   . Obesity   . Panic attacks   . Sciatica       Past Surgical History:  Procedure Laterality Date  . BUNIONECTOMY Left yrs ago  . COLONOSCOPY, ESOPHAGOGASTRODUODENOSCOPY (EGD) AND ESOPHAGEAL DILATION N/A 12/03/2012   ULA:GTXMIWOE melanosis throughout the entire examined colon/The colon IS redundant/Small internal hemorrhoids/EGD:Esophageal web/Medium sized hiatal hernia/MILD Non-erosive gastritis  . ESOPHAGOGASTRODUODENOSCOPY  03/09/09   Dr. Wilford Corner, normal EGD, s/p Bravo capsule placement  . GASTRIC ROUX-EN-Y N/A 07/16/2017   Procedure: LAPAROSCOPIC ROUX-EN-Y GASTRIC BYPASS WITH UPPER ENDOSCOPY AND ERAS PATHWAY;  Surgeon: Johnathan Hausen, MD;  Location: [facility];  Service: General;  Laterality: N/A;  . TUBAL LIGATION    . WISDOM TOOTH EXTRACTION        Current Facility-Administered Medications  Medication Dose Route Frequency Provider Last Rate Last Dose  . iopamidol (ISOVUE-300) 61 % injection            Current Outpatient Medications  Medication Sig Dispense Refill  . acetaminophen (TYLENOL) 500 MG tablet Take 1,000 mg by mouth daily as needed for moderate pain or headache.    . Biotin 10000 MCG TABS Take 10,000  mcg by mouth daily.    Marland Kitchen buPROPion (WELLBUTRIN SR) 200 MG 12 hr tablet Take 200 mg by mouth 2 (two) times daily.  0  . CALCIUM PO Take 1 capsule by mouth daily.    . Cholecalciferol (VITAMIN D) 2000 units CAPS Take 2,000 Units by mouth daily.    . ferrous sulfate 325 (65 FE) MG EC tablet Take 325 mg by mouth daily with breakfast.    . FLUoxetine (PROZAC) 40 MG capsule Take 40 mg by mouth daily.     . metoprolol succinate (TOPROL-XL) 25 MG 24 hr tablet Take 25 mg by mouth daily.     . ondansetron (ZOFRAN-ODT) 4 MG disintegrating tablet Take 4 mg by mouth every 8 (eight) hours as needed for nausea/vomiting.  0  . oxyCODONE  (ROXICODONE) 5 MG/5ML solution Take 5 mg by mouth every 6 (six) hours as needed for moderate pain.   0  . pantoprazole (PROTONIX) 40 MG tablet Take 40 mg by mouth daily.  1  . topiramate (TOPAMAX) 100 MG tablet Take 100 mg by mouth daily.    . Armodafinil 200 MG TABS Take 200 mg by mouth every morning. (Patient not taking: Reported on 07/05/2017) 30 tablet 5  . modafinil (PROVIGIL) 200 MG tablet Take 1 tablet (200 mg total) by mouth daily. (Patient not taking: Reported on 07/05/2017) 30 tablet 1  . omeprazole (PRILOSEC) 40 MG capsule Take 1 capsule (40 mg total) by mouth daily. (Patient not taking: Reported on 07/20/2017) 30 capsule 3     No Known Allergies  REVIEW OF SYSTEMS: Skin:  No history of rash.  No history of abnormal moles. Infection:  No history of hepatitis or HIV.  No history of MRSA. Neurologic:  Dizzy/passed out about a year ago.  Sees Dr. Fernande Bras - no etiology found Cardiac:  HTN Pulmonary:  Does not smoke cigarettes.  No asthma or bronchitis.  No OSA/CPAP.  Endocrine:  No diabetes. No thyroid disease. Gastrointestinal:  See HPI.  Has GERD prior to surgery Urologic:  No history of kidney stones.  No history of bladder infections. Musculoskeletal:  Bulging disk in back - attributed to AA in May 2018.  Sees Dr. Ron Agee. Hematologic:  No bleeding disorder.  No history of anemia.  Not anticoagulated. Psycho-social:  The patient is oriented.   The patient has no obvious psychologic or social impairment to understanding our conversation and plan.  SOCIAL and FAMILY HISTORY: Married. Has 2 boys 58 and 47 yo.  Has adopted 47 yo girl (from family member) Works as Building services engineer for [employer]  PHYSICAL EXAM: BP (!) 156/85 (BP Location: Right Arm)   Pulse 65   Temp 98.3 F (36.8 C) (Oral)   Resp (!) 21   Ht 5\' 3"  (1.6 m)   Wt 112.5 kg (248 lb)   LMP 07/09/2017   SpO2 99%   BMI 43.93 kg/m   General: Obese AA F who is alert.She is only mildly uncomfortable. Skin:   Inspection and palpation - no mass or rash. Eyes:  Conjunctiva and lids unremarkable.            Pupils are equal Ears, Nose, Mouth, and Throat:  Ears and nose unremarkable            Lips and teeth are unremarable. Neck: Supple. No mass, trachea midline.  No thyroid mass. Lymph Nodes:  No supraclavicular, cervical, or inguinal nodes. Lungs: Normal respiratory effort.  Clear to auscultation and symmetric breath sounds. Heart:  Palpation  of the heart is normal.            Auscultation: RRR. No murmur or rub.  Abdomen: Soft. All her trocar sites look okay.     She has a mass effect under her left upper mid clavicular trocar site.  It is somewhat tender.  Rectal: Not done. Musculoskeletal:  Good muscle strength and ROM  in upper and lower extremities.  Neurologic:  Grossly intact to motor and sensory function. Psychiatric: Normal judgement and insight. Behavior is normal.            Oriented to time, person, place.   DATA REVIEWED, COUNSELING AND COORDINATION OF CARE: Epic notes reviewed. Counseling and coordination of care exceeded more than 50% of the time spent with patient. Total time spent with patient and charting: 45 minutes  Alphonsa Overall, MD,  [facility], [address]  [place], [place]    [place] Phone:  [phone] FAX:  [phone]

## 2017-07-20 NOTE — Anesthesia Preprocedure Evaluation (Signed)
Anesthesia Evaluation  Patient identified by MRN, date of birth, ID band Patient awake    Reviewed: Allergy & Precautions, NPO status , Patient's Chart, lab work & pertinent test results  History of Anesthesia Complications Negative for: history of anesthetic complications  Airway Mallampati: II  TM Distance: >3 FB Neck ROM: Full    Dental no notable dental hx. (+) Dental Advisory Given   Pulmonary neg pulmonary ROS, former smoker,    Pulmonary exam normal        Cardiovascular hypertension, Pt. on medications and Pt. on home beta blockers Normal cardiovascular exam     Neuro/Psych PSYCHIATRIC DISORDERS Anxiety Depression    GI/Hepatic Neg liver ROS, hiatal hernia, GERD  ,  Endo/Other  Morbid obesity  Renal/GU negative Renal ROS     Musculoskeletal  (+) Arthritis ,   Abdominal   Peds  Hematology negative hematology ROS (+)   Anesthesia Other Findings Day of surgery medications reviewed with the patient.  Reproductive/Obstetrics                             Anesthesia Physical  Anesthesia Plan  ASA: III  Anesthesia Plan: General   Post-op Pain Management:    Induction: Intravenous  PONV Risk Score and Plan: 4 or greater and Ondansetron, Dexamethasone, Scopolamine patch - Pre-op and Diphenhydramine  Airway Management Planned: Oral ETT  Additional Equipment:   Intra-op Plan:   Post-operative Plan: Extubation in OR  Informed Consent: I have reviewed the patients History and Physical, chart, labs and discussed the procedure including the risks, benefits and alternatives for the proposed anesthesia with the patient or authorized representative who has indicated his/her understanding and acceptance.   Dental advisory given  Plan Discussed with: CRNA and Anesthesiologist  Anesthesia Plan Comments:         Anesthesia Quick Evaluation

## 2017-07-21 ENCOUNTER — Encounter (HOSPITAL_COMMUNITY): Payer: Self-pay | Admitting: Surgery

## 2017-07-21 LAB — CBC WITH DIFFERENTIAL/PLATELET
Basophils Absolute: 0 10*3/uL (ref 0.0–0.1)
Basophils Relative: 0 %
Eosinophils Absolute: 0 10*3/uL (ref 0.0–0.7)
Eosinophils Relative: 0 %
HCT: 37.3 % (ref 36.0–46.0)
HEMOGLOBIN: 12.4 g/dL (ref 12.0–15.0)
LYMPHS ABS: 1.1 10*3/uL (ref 0.7–4.0)
LYMPHS PCT: 11 %
MCH: 30.8 pg (ref 26.0–34.0)
MCHC: 33.2 g/dL (ref 30.0–36.0)
MCV: 92.8 fL (ref 78.0–100.0)
MONOS PCT: 8 %
Monocytes Absolute: 0.8 10*3/uL (ref 0.1–1.0)
NEUTROS PCT: 81 %
Neutro Abs: 7.5 10*3/uL (ref 1.7–7.7)
Platelets: 338 10*3/uL (ref 150–400)
RBC: 4.02 MIL/uL (ref 3.87–5.11)
RDW: 13.6 % (ref 11.5–15.5)
WBC: 9.4 10*3/uL (ref 4.0–10.5)

## 2017-07-21 NOTE — Progress Notes (Signed)
Pt was discharged home today. Instructions were reviewed with patient, and questions were answered. Pt was taken to main entrance via wheelchair by NT.  

## 2017-07-21 NOTE — Discharge Summary (Signed)
Physician Discharge Summary  Patient ID:  Brooke [name]  MRN: [id number]  DOB/AGE: [birth year] 47 y.o.  Admit date: 07/20/2017 Discharge date: 07/21/2017  Discharge Diagnoses:  1.  SBO secondary to trocar site hernia in LUQ 2.  Recent RYGB - 07/16/2017 - [facility] 3.  Morbid obesity             BMI - 44 4.  GERD 5.  HTN 6.  History of dizziness - followed by Dr. Dolhmier 7.  Disk injury in back from [place] - May 2018             Sees Dr. Ron Agee   Active Problems:   Small bowel obstruction [facility])  Operation: Procedure(s):  LAPAROSCOPY DIAGNOSTIC, REDUCTION OF SMALL BOWEL OBSTRUCTION, REPAIR OF TROCAR HERNIA. on 07/20/2017 - D. Jeannett Senior in paper chart]  Discharged Condition: good  Hospital Course: Brooke [name] is an 47 y.o. female whose primary care physician is [name], MD and who was admitted 07/20/2017 with a chief complaint of  Chief Complaint  Patient presents with  . Post-op Problem  .   She was brought to the operating room on 07/20/2017 and underwent LAPAROSCOPY DIAGNOSTIC. REDUCTION OF SMALL BOWEL OBSTRUCTION. REPAIR OF TROCAR HERNIA.  She is now one day post op.  She is taking po's well without nausea.  The discharge instructions were reviewed with the patient.  Consults: None  Significant Diagnostic Studies: Results for orders placed or performed during the hospital encounter of 07/20/17  Comprehensive metabolic panel  Result Value Ref Range   Sodium 138 135 - 145 mmol/L   Potassium 3.9 3.5 - 5.1 mmol/L   Chloride 104 98 - 111 mmol/L   CO2 23 22 - 32 mmol/L   Glucose, Bld 97 70 - 99 mg/dL   BUN 11 6 - 20 mg/dL   Creatinine, Ser 0.94 0.44 - 1.00 mg/dL   Calcium 8.9 8.9 - 10.3 mg/dL   Total Protein 7.1 6.5 - 8.1 g/dL   Albumin 3.4 (L) 3.5 - 5.0 g/dL   AST 28 15 - 41 U/L   ALT 37 0 - 44 U/L   Alkaline Phosphatase 57 38 - 126 U/L   Total Bilirubin 0.7 0.3 - 1.2 mg/dL   GFR calc non Af Amer >60 >60 mL/min   GFR calc Af Amer >60 >60 mL/min   Anion gap  11 5 - 15  CBC with Differential  Result Value Ref Range   WBC 8.2 4.0 - 10.5 K/uL   RBC 4.46 3.87 - 5.11 MIL/uL   Hemoglobin 14.0 12.0 - 15.0 g/dL   HCT 41.8 36.0 - 46.0 %   MCV 93.7 78.0 - 100.0 fL   MCH 31.4 26.0 - 34.0 pg   MCHC 33.5 30.0 - 36.0 g/dL   RDW 13.5 11.5 - 15.5 %   Platelets 389 150 - 400 K/uL   Neutrophils Relative % 77 %   Neutro Abs 6.4 1.7 - 7.7 K/uL   Lymphocytes Relative 16 %   Lymphs Abs 1.3 0.7 - 4.0 K/uL   Monocytes Relative 6 %   Monocytes Absolute 0.5 0.1 - 1.0 K/uL   Eosinophils Relative 1 %   Eosinophils Absolute 0.1 0.0 - 0.7 K/uL   Basophils Relative 0 %   Basophils Absolute 0.0 0.0 - 0.1 K/uL  Lipase, blood  Result Value Ref Range   Lipase 23 11 - 51 U/L  Urinalysis, Routine w reflex microscopic  Result Value Ref Range   Color, Urine  YELLOW YELLOW   APPearance CLOUDY (A) CLEAR   Specific Gravity, Urine 1.021 1.005 - 1.030   pH 6.0 5.0 - 8.0   Glucose, UA NEGATIVE NEGATIVE mg/dL   Hgb urine dipstick NEGATIVE NEGATIVE   Bilirubin Urine NEGATIVE NEGATIVE   Ketones, ur 80 (A) NEGATIVE mg/dL   Protein, ur NEGATIVE NEGATIVE mg/dL   Nitrite NEGATIVE NEGATIVE   Leukocytes, UA MODERATE (A) NEGATIVE   WBC, UA 0-5 0 - 5 WBC/hpf   Bacteria, UA RARE (A) NONE SEEN   Squamous Epithelial / LPF 0-5 0 - 5   Mucus PRESENT    Amorphous Crystal PRESENT   CBC WITH DIFFERENTIAL  Result Value Ref Range   WBC 9.4 4.0 - 10.5 K/uL   RBC 4.02 3.87 - 5.11 MIL/uL   Hemoglobin 12.4 12.0 - 15.0 g/dL   HCT 37.3 36.0 - 46.0 %   MCV 92.8 78.0 - 100.0 fL   MCH 30.8 26.0 - 34.0 pg   MCHC 33.2 30.0 - 36.0 g/dL   RDW 13.6 11.5 - 15.5 %   Platelets 338 150 - 400 K/uL   Neutrophils Relative % 81 %   Neutro Abs 7.5 1.7 - 7.7 K/uL   Lymphocytes Relative 11 %   Lymphs Abs 1.1 0.7 - 4.0 K/uL   Monocytes Relative 8 %   Monocytes Absolute 0.8 0.1 - 1.0 K/uL   Eosinophils Relative 0 %   Eosinophils Absolute 0.0 0.0 - 0.7 K/uL   Basophils Relative 0 %   Basophils  Absolute 0.0 0.0 - 0.1 K/uL  I-Stat beta hCG blood, ED  Result Value Ref Range   I-stat hCG, quantitative <5.0 <5 mIU/mL   Comment 3            Ct Abdomen Pelvis W Contrast  Result Date: 07/20/2017 CLINICAL DATA:  Recent bariatric surgery 4 days ago. Persistent diaphoresis with vomiting and nausea. EXAM: CT ABDOMEN AND PELVIS WITH CONTRAST TECHNIQUE: Multidetector CT imaging of the abdomen and pelvis was performed using the standard protocol following bolus administration of intravenous contrast. CONTRAST:  166mL ISOVUE-300 IOPAMIDOL (ISOVUE-300) INJECTION 61% COMPARISON:  None. FINDINGS: Lower chest: 3 mm nodule over the right lower lobe. Hepatobiliary: Normal. Pancreas: Normal. Spleen: Normal. Adrenals/Urinary Tract: Adrenal glands are normal. Kidneys are normal in size without hydronephrosis or nephrolithiasis. Ureters and bladder are within normal. Stomach/Bowel: Evidence patient's recent gastric bypass surgery with gastrojejunostomy. There is dilatation of the proximal jejunum just distal to the gastrojejunostomy measuring 4 cm in diameter as the site of obstruction is over the left lower quadrant at a fascial defect from a left-sided ventral hernia. There is narrowing of this jejunal loops as it crosses the fascial defect as there is a short segment of jejunum within the hernia sac. Small bowel distal to the hernia sac is within normal. Air is present along the left anterior abdominal wall from the upper abdomen to the lower quadrant/pelvis likely related patient's recent surgery. There are several small collections of air within the subcutaneous fat lateral to the left-sided ventral hernia likely postsurgical and much less likely due to perforation of the herniated small bowel segment. Suture line over a small bowel loop over the left mid abdomen. Appendix is normal.  Colon is normal. Vascular/Lymphatic: Normal. Reproductive: Within normal. Other: Stranding of the subcutaneous fat over the left mid  to lower abdomen likely postsurgical. No significant free peritoneal fluid. Musculoskeletal: Within normal. IMPRESSION: Evidence of patient's recent gastric bypass surgery. There is a proximal small bowel obstruction  with transition point at the fascial defect of a left-sided ventral hernia. There is a short segment of small bowel within the hernia sac. There is air over the left abdominal wall likely related to patient's recent surgery and much less likely due to perforation of the herniated small bowel segment. Electronically Signed   By: Marin Olp M.D.   On: 07/20/2017 10:43    Discharge Exam:  Vitals:   07/21/17 0115 07/21/17 0551  BP: 136/67 138/81  Pulse: 65 (!) 57  Resp: 14 16  Temp: 98.1 F (36.7 C) 98 F (36.7 C)  SpO2: 97% 99%    General: WN AA F who is alert and generally healthy appearing.  Lungs: Clear to auscultation and symmetric breath sounds. Heart:  RRR. No murmur or rub. Abdomen: Soft.  Has bowel sounds.  Wounds looks good.  Discharge Medications:   Allergies as of 07/21/2017   No Known Allergies     Medication List    TAKE these medications   acetaminophen 500 MG tablet Commonly known as:  TYLENOL Take 1,000 mg by mouth daily as needed for moderate pain or headache.   Armodafinil 200 MG Tabs Take 200 mg by mouth every morning.   Biotin 10000 MCG Tabs Take 10,000 mcg by mouth daily.   buPROPion 200 MG 12 hr tablet Commonly known as:  WELLBUTRIN SR Take 200 mg by mouth 2 (two) times daily.   CALCIUM PO Take 1 capsule by mouth daily.   ferrous sulfate 325 (65 FE) MG EC tablet Take 325 mg by mouth daily with breakfast.   FLUoxetine 40 MG capsule Commonly known as:  PROZAC Take 40 mg by mouth daily.   metoprolol succinate 25 MG 24 hr tablet Commonly known as:  TOPROL-XL Take 25 mg by mouth daily.   modafinil 200 MG tablet Commonly known as:  PROVIGIL Take 1 tablet (200 mg total) by mouth daily.   omeprazole 40 MG capsule Commonly known  as:  PRILOSEC Take 1 capsule (40 mg total) by mouth daily.   ondansetron 4 MG disintegrating tablet Commonly known as:  ZOFRAN-ODT Take 4 mg by mouth every 8 (eight) hours as needed for nausea/vomiting.   oxyCODONE 5 MG/5ML solution Commonly known as:  ROXICODONE Take 5 mg by mouth every 6 (six) hours as needed for moderate pain.   pantoprazole 40 MG tablet Commonly known as:  PROTONIX Take 40 mg by mouth daily.   topiramate 100 MG tablet Commonly known as:  TOPAMAX Take 100 mg by mouth daily.   Vitamin D 2000 units Caps Take 2,000 Units by mouth daily.       Disposition: Discharge disposition: 01-Home or Self Care       Discharge Instructions    Ambulate hourly while awake   Complete by:  As directed    Call MD for:  difficulty breathing, headache or visual disturbances   Complete by:  As directed    Call MD for:  persistant dizziness or light-headedness   Complete by:  As directed    Call MD for:  persistant nausea and vomiting   Complete by:  As directed    Call MD for:  redness, tenderness, or signs of infection (pain, swelling, redness, odor or green/yellow discharge around incision site)   Complete by:  As directed    Call MD for:  severe uncontrolled pain   Complete by:  As directed    Call MD for:  temperature >101 F   Complete by:  As  directed    Diet bariatric full liquid   Complete by:  As directed    Incentive spirometry   Complete by:  As directed    Perform hourly while awake      Signed: Alphonsa Overall, M.D., [facility] Office:  [phone]  07/21/2017, 10:05 AM

## 2017-07-21 NOTE — Discharge Instructions (Signed)
[facility] - DISCHARGE INSTRUCTIONS TO PATIENT  Activity:  Driving - May drive in 2 or 3 days, if doing well and off pain meds   Lifting - No lifting more than 15 pounds for 10 days, then no limit  Wound Care:   May shower starting tomorrow.  Diet:  Post op gastric bypass diet - protein drinks and water  Follow up appointment:  Call Dr. Earlie Server office [facility]) at [phone] for an appointment in 2 weeks.  Medications and dosages:  Resume your home medications.  Call Dr. Hassell Done or his office  [phone]) if you have:  Temperature greater than 100.4,  Persistent nausea and vomiting,  Severe uncontrolled pain,  Redness, tenderness, or signs of infection (pain, swelling, redness, odor or green/yellow discharge around the site),  Difficulty breathing, headache or visual disturbances,  Any other questions or concerns you may have after discharge.  In an emergency, call 911 or go to an Emergency Department at a nearby hospital.

## 2017-07-22 ENCOUNTER — Telehealth (HOSPITAL_COMMUNITY): Payer: Self-pay

## 2017-07-22 NOTE — Telephone Encounter (Signed)
Patient called to discuss post bariatric surgery follow up questions.  See below:   1.  Tell me about your pain and pain management?has taken pain medication had to have hernia repair Sat  2.  Let's talk about fluid intake.  How much total fluid are you taking in?50 ounces of fluid  3.  How much protein have you taken in the last 2 days?30 gram   4.  Have you had nausea?  Tell me about when have experienced nausea and what you did to help?no nausea since hernia repair, significant nausea before.  Patient still has nausea medication prescribed  5.  Has the frequency or color changed with your urine?frequent urination  6.  Tell me what your incisions look like?no problems with incisions other than pain  7.  Have you been passing gas? BM?passing gas since hernia repair no bm  8.  If a problem or question were to arise who would you call?  Do you know contact numbers for [place], [facility], and [facility]?is aware of how to contact all services  9.  How has the walking going?walking frequently as directed  10.  How are your vitamins and calcium going?  How are you taking them?taking calcium and mvi as directed

## 2017-07-30 ENCOUNTER — Encounter: Payer: BLUE CROSS/BLUE SHIELD | Attending: Surgery | Admitting: Skilled Nursing Facility1

## 2017-07-30 DIAGNOSIS — Z6841 Body Mass Index (BMI) 40.0 and over, adult: Secondary | ICD-10-CM | POA: Diagnosis not present

## 2017-07-30 DIAGNOSIS — G40909 Epilepsy, unspecified, not intractable, without status epilepticus: Secondary | ICD-10-CM | POA: Diagnosis not present

## 2017-07-30 DIAGNOSIS — I10 Essential (primary) hypertension: Secondary | ICD-10-CM | POA: Insufficient documentation

## 2017-07-30 DIAGNOSIS — F329 Major depressive disorder, single episode, unspecified: Secondary | ICD-10-CM | POA: Diagnosis not present

## 2017-07-30 DIAGNOSIS — Z8541 Personal history of malignant neoplasm of cervix uteri: Secondary | ICD-10-CM | POA: Diagnosis not present

## 2017-07-30 DIAGNOSIS — K219 Gastro-esophageal reflux disease without esophagitis: Secondary | ICD-10-CM | POA: Diagnosis not present

## 2017-07-30 DIAGNOSIS — Z79899 Other long term (current) drug therapy: Secondary | ICD-10-CM | POA: Insufficient documentation

## 2017-07-30 DIAGNOSIS — Z713 Dietary counseling and surveillance: Secondary | ICD-10-CM | POA: Insufficient documentation

## 2017-07-30 DIAGNOSIS — G43909 Migraine, unspecified, not intractable, without status migrainosus: Secondary | ICD-10-CM | POA: Diagnosis not present

## 2017-07-31 ENCOUNTER — Encounter: Payer: Self-pay | Admitting: Skilled Nursing Facility1

## 2017-07-31 NOTE — Progress Notes (Signed)
Bariatric Class:  Appt start time: 1530 end time:  1630.  2 Week Post-Operative Nutrition Class  Patient was seen on 07/30/2017 for Post-[facility]ative Nutrition [facility].   Pt admits to not meeting her protein or fluid goals. Pt reports pain in her left side stating her surgeon told her it was more than likely gas.  Surgery date: 07/16/2017 Surgery type: RYGB Start weight at [facility]: 238.2 Weight today: 229.8  TANITA  BODY COMP RESULTS  07/30/2017   BMI (kg/m^2) 40.7   Fat Mass (lbs) 119.4   Fat Free Mass (lbs) 110.4   Total Body Water (lbs) 80.4   The following the learning objectives were met by the patient during this course:  Identifies Phase 3A (Soft, High Proteins) Dietary Goals and will begin from 2 weeks post-operatively to 2 months post-operatively  Identifies appropriate sources of fluids and proteins   States protein recommendations and appropriate sources post-operatively  Identifies the need for appropriate texture modifications, mastication, and bite sizes when consuming solids  Identifies appropriate multivitamin and calcium sources post-operatively  Describes the need for physical activity post-operatively and will follow MD recommendations  States when to call healthcare provider regarding medication questions or post-operative complications  Handouts given during class include:  Phase 3A: Soft, High Protein Diet Handout  Follow-Up Plan: Patient will follow-up at [facility] in 6 weeks for 2 month post-op nutrition visit for diet advancement per MD.

## 2017-08-02 ENCOUNTER — Telehealth: Payer: Self-pay | Admitting: Registered"

## 2017-08-02 NOTE — Telephone Encounter (Signed)
RD called pt to verify fluid intake once starting soft, solid proteins 2 week post-bariatric surgery.   Daily Fluid intake: ~40 ounces (including 12 oz diet V8 juice) Daily Protein intake: Pt states she does not know.   Concerns/issues: Pt was encouraged to decrease juice intake, focus more on drinking sugar-free drinks, and doing liquids only until she is able to consistently consume >40 ounces of fluid/day. Pt was also encouraged to track protein intake to be aware of daily intake. RD will call pt next week to follow-up.

## 2017-08-06 ENCOUNTER — Telehealth: Payer: Self-pay | Admitting: Skilled Nursing Facility1

## 2017-08-06 NOTE — Telephone Encounter (Signed)
Pt states she is constipated a lot of the time with pain in her abdomen but after having a bowel movent relieved. Pt states she used smooth move to have a bowel movement. Pt states she has been doing nothing but fluid since Friday. Tuesday to Friday (after post op) lunch meat and seafood, chicken, and eating vegetables with no problem.   Pt states she is doing great getting in 64 fluid ounces and 60 grams of liquid protein.   Dietitian advised pt to not eat beef, pork, chicken, or lunch meat. Advised pt to eat anything that swims and plant based proteins. Advised pt to call dietitian Thursday to tell her how eating solid foods went.

## 2017-08-08 ENCOUNTER — Telehealth: Payer: Self-pay | Admitting: Skilled Nursing Facility1

## 2017-08-08 NOTE — Telephone Encounter (Signed)
Dietitian called to check on pts progress.  Pt states she is doing well and eating tuna and beans and some protein shakes.   Pt states she still has some gas pains.

## 2017-08-26 DIAGNOSIS — Z0271 Encounter for disability determination: Secondary | ICD-10-CM

## 2017-09-11 ENCOUNTER — Ambulatory Visit: Payer: BLUE CROSS/BLUE SHIELD | Admitting: Registered"

## 2017-10-01 ENCOUNTER — Other Ambulatory Visit: Payer: Self-pay | Admitting: Neurology

## 2017-10-01 DIAGNOSIS — G40804 Other epilepsy, intractable, without status epilepticus: Secondary | ICD-10-CM | POA: Diagnosis not present

## 2017-10-01 DIAGNOSIS — I10 Essential (primary) hypertension: Secondary | ICD-10-CM | POA: Diagnosis not present

## 2017-10-01 DIAGNOSIS — F410 Panic disorder [episodic paroxysmal anxiety] without agoraphobia: Secondary | ICD-10-CM | POA: Diagnosis not present

## 2017-10-02 NOTE — Telephone Encounter (Signed)
Rx registry checked. Last fill date is 01/24/17 for #14. No follow up visit scheduled at this time.

## 2017-10-16 DIAGNOSIS — Z0289 Encounter for other administrative examinations: Secondary | ICD-10-CM

## 2017-10-18 DIAGNOSIS — F419 Anxiety disorder, unspecified: Secondary | ICD-10-CM | POA: Diagnosis not present

## 2017-10-29 ENCOUNTER — Encounter: Payer: BLUE CROSS/BLUE SHIELD | Attending: Surgery | Admitting: Skilled Nursing Facility1

## 2017-10-29 DIAGNOSIS — Z9884 Bariatric surgery status: Secondary | ICD-10-CM | POA: Insufficient documentation

## 2017-10-29 DIAGNOSIS — Z713 Dietary counseling and surveillance: Secondary | ICD-10-CM | POA: Diagnosis not present

## 2017-10-29 DIAGNOSIS — E669 Obesity, unspecified: Secondary | ICD-10-CM

## 2017-10-29 NOTE — Progress Notes (Signed)
Follow-up visit: Post-Operative RYGB Surgery  Primary concerns today: Post-operative Bariatric Surgery Nutrition Management.  Pt states she is taking the [place] multivitamin by the direction of her surgeon. Pt states she has anxiety/depression getting worse in the last 2 months. Pt states she had anxiety and depression before getting surgery but due to domestic stress (husband stroke a few years ago, husband needing knee replacement, and raising her cousin (her daughter) after having grown children). Pt states she has been eating chips and cake and feeling guilt afterwards. Pt states she is nauseous when first waking up lasting about 15-20 minutes trying juice or protein shakes sometimes it helps: pt believed the nausea to be her fault caused by dumping: Dietitian educated the pt on needing to have eaten for it to be dumping syndrome so the nauseas is not your fault.  Pt states if she does not take anything for her bowel movements she will not have one for the week. Pt admits to skipping meals due to a fear of eating. Pt states she has been seeing her therapist every 2 weeks from September. Pt agreed to work with an eating disorder dietitian Isidore Moos, RD to better serve her needs.  Pt admits to skipping dinner for fear of eating foods in relation to weight gain. When dietitian brought up the discussion of eating dinner pt became visibly agitated (bituing her nails and twisting uncomfortably in her seat). Pt finds carbohydrate containing foods to be on her list of no foods. Pt agreed to having a protein shake for dinner to start as her first goal.   Surgery date: 07/16/2017 Surgery type: RYGB Start weight at [facility]: 238.2 Weight today: 214.4  TANITA  BODY COMP RESULTS  07/30/2017 10/29/2017   BMI (kg/m^2) 40.7 38   Fat Mass (lbs) 119.4 103.6   Fat Free Mass (lbs) 110.4 110.8   Total Body Water (lbs) 80.4 80.2     24-hr recall: throughout the week protein shake; 3 meals 3 days out of the  week; more often 2 snacks and 2 meals  B (AM): half an egg and 2 slices of bacon Snk (AM):  L (PM): tossed salad (nonstarchy vegetables) with dressing waiting 20 minutes to drink Snk (PM): yogurt D (PM): skipped----chicken and pinto beans or green beans  Snk (PM):   Fluid intake: water, diet juice: 32 ounces  Estimated total protein intake: 45+  Medications: see list  Supplementation: biotin + vitmain d + iron + calcium + flinestone   Using straws: no Drinking while eating: no Having you been chewing well:no Chewing/swallowing difficulties: no Changes in vision: no Changes to mood/headaches: no Hair loss/Cahnges to skin/Changes to nails: no Any difficulty focusing or concentrating: no Sweating: no Dizziness/Lightheaded: no Palpitations: no  Carbonated beverages: no N/V/D/C/GAS: constipated and nauseous  Abdominal Pain: no Dumping syndrome: no  Recent physical activity:  Walking 2 miles a day 7 days a week   Progress Towards Goal(s):  In progress.    Intervention:  Nutrition counseling.  Due to the bodies need for essential vitamins, minerals, and fats the pt was educated on the need to consume a certain amount of calories as well as certain nutrients daily. Pt was educated on the need for daily physical activity and to reach a goal of at least 150 minutes of moderate to vigorous physical activity as directed by their physician due to such benefits as increased musculature and improved lab values.  Pt was educated on the basic foundation of balanced measl and why  her body needs carbohydrates.  Goals: -Take the appropriate multivitamin with food on your stomach; take it latter in the day -Have a protein shake for dinner -Try flavored kefir, 4-8 ounces every day  Teaching Method Utilized:  Visual Auditory Hands on  Barriers to learning/adherence to lifestyle change: food anxiety/fear  Demonstrated degree of understanding via:  Teach Back   Monitoring/Evaluation:   Dietary intake, exercise, and body weight.

## 2017-10-29 NOTE — Patient Instructions (Addendum)
-  Take the appropriate multivitamin with food on your stomach; take it latter in the day  -Have a protein shake for dinner  -Try flavored kefir, 4-8 ounces every day

## 2017-10-30 DIAGNOSIS — F329 Major depressive disorder, single episode, unspecified: Secondary | ICD-10-CM | POA: Diagnosis not present

## 2017-11-11 DIAGNOSIS — S060X0S Concussion without loss of consciousness, sequela: Secondary | ICD-10-CM | POA: Diagnosis not present

## 2017-11-11 DIAGNOSIS — M47812 Spondylosis without myelopathy or radiculopathy, cervical region: Secondary | ICD-10-CM | POA: Diagnosis not present

## 2017-11-11 DIAGNOSIS — G44309 Post-traumatic headache, unspecified, not intractable: Secondary | ICD-10-CM | POA: Diagnosis not present

## 2017-11-20 DIAGNOSIS — F419 Anxiety disorder, unspecified: Secondary | ICD-10-CM | POA: Diagnosis not present

## 2017-11-26 ENCOUNTER — Ambulatory Visit: Payer: BLUE CROSS/BLUE SHIELD | Admitting: Registered"

## 2018-02-05 ENCOUNTER — Other Ambulatory Visit: Payer: Self-pay | Admitting: Neurology

## 2018-04-16 DIAGNOSIS — Z6836 Body mass index (BMI) 36.0-36.9, adult: Secondary | ICD-10-CM | POA: Diagnosis not present

## 2018-04-16 DIAGNOSIS — Z0000 Encounter for general adult medical examination without abnormal findings: Secondary | ICD-10-CM | POA: Diagnosis not present

## 2018-05-09 ENCOUNTER — Other Ambulatory Visit: Payer: Self-pay | Admitting: Neurology

## 2018-07-05 ENCOUNTER — Emergency Department (HOSPITAL_COMMUNITY)
Admission: EM | Admit: 2018-07-05 | Discharge: 2018-07-06 | Disposition: A | Discharge status: Home / Self Care | Payer: BC Managed Care – PPO | Attending: Emergency Medicine | Admitting: Emergency Medicine

## 2018-07-05 ENCOUNTER — Encounter (HOSPITAL_COMMUNITY): Payer: Self-pay | Admitting: Emergency Medicine

## 2018-07-05 ENCOUNTER — Other Ambulatory Visit: Payer: Self-pay

## 2018-07-05 ENCOUNTER — Emergency Department (HOSPITAL_COMMUNITY): Payer: BC Managed Care – PPO

## 2018-07-05 DIAGNOSIS — I10 Essential (primary) hypertension: Secondary | ICD-10-CM | POA: Diagnosis not present

## 2018-07-05 DIAGNOSIS — R51 Headache: Secondary | ICD-10-CM | POA: Diagnosis not present

## 2018-07-05 DIAGNOSIS — R4020 Unspecified coma: Secondary | ICD-10-CM | POA: Diagnosis not present

## 2018-07-05 DIAGNOSIS — N390 Urinary tract infection, site not specified: Secondary | ICD-10-CM | POA: Insufficient documentation

## 2018-07-05 DIAGNOSIS — R569 Unspecified convulsions: Secondary | ICD-10-CM | POA: Diagnosis not present

## 2018-07-05 DIAGNOSIS — G40909 Epilepsy, unspecified, not intractable, without status epilepticus: Secondary | ICD-10-CM | POA: Diagnosis not present

## 2018-07-05 DIAGNOSIS — R079 Chest pain, unspecified: Secondary | ICD-10-CM | POA: Diagnosis not present

## 2018-07-05 HISTORY — DX: Unspecified convulsions: R56.9

## 2018-07-05 LAB — COMPREHENSIVE METABOLIC PANEL
ALT: 22 U/L (ref 0–44)
AST: 21 U/L (ref 15–41)
Albumin: 4 g/dL (ref 3.5–5.0)
Alkaline Phosphatase: 77 U/L (ref 38–126)
Anion gap: 13 (ref 5–15)
BUN: 21 mg/dL — ABNORMAL HIGH (ref 6–20)
CO2: 18 mmol/L — ABNORMAL LOW (ref 22–32)
Calcium: 8.6 mg/dL — ABNORMAL LOW (ref 8.9–10.3)
Chloride: 109 mmol/L (ref 98–111)
Creatinine, Ser: 1.36 mg/dL — ABNORMAL HIGH (ref 0.44–1.00)
GFR calc Af Amer: 53 mL/min — ABNORMAL LOW (ref >60)
GFR calc non Af Amer: 46 mL/min — ABNORMAL LOW (ref >60)
Glucose, Bld: 99 mg/dL (ref 70–99)
Potassium: 3.8 mmol/L (ref 3.5–5.1)
Sodium: 140 mmol/L (ref 135–145)
Total Bilirubin: 0.5 mg/dL (ref 0.3–1.2)
Total Protein: 6.9 g/dL (ref 6.5–8.1)

## 2018-07-05 LAB — CBC WITH DIFFERENTIAL/PLATELET
Abs Immature Granulocytes: 0.01 10*3/uL (ref 0.00–0.07)
Basophils Absolute: 0 10*3/uL (ref 0.0–0.1)
Basophils Relative: 1 %
Eosinophils Absolute: 0.1 10*3/uL (ref 0.0–0.5)
Eosinophils Relative: 1 %
HCT: 42.9 % (ref 36.0–46.0)
Hemoglobin: 13.7 g/dL (ref 12.0–15.0)
Immature Granulocytes: 0 %
Lymphocytes Relative: 22 %
Lymphs Abs: 1.3 10*3/uL (ref 0.7–4.0)
MCH: 30.7 pg (ref 26.0–34.0)
MCHC: 31.9 g/dL (ref 30.0–36.0)
MCV: 96.2 fL (ref 80.0–100.0)
Monocytes Absolute: 0.4 10*3/uL (ref 0.1–1.0)
Monocytes Relative: 8 %
Neutro Abs: 3.8 10*3/uL (ref 1.7–7.7)
Neutrophils Relative %: 68 %
Platelets: 340 10*3/uL (ref 150–400)
RBC: 4.46 MIL/uL (ref 3.87–5.11)
RDW: 13.8 % (ref 11.5–15.5)
WBC: 5.6 10*3/uL (ref 4.0–10.5)
nRBC: 0 % (ref 0.0–0.2)

## 2018-07-05 LAB — CK: Total CK: 142 U/L (ref 38–234)

## 2018-07-05 MED ORDER — LEVETIRACETAM IN NACL 500 MG/100ML IV SOLN
500.0000 mg | Freq: Once | INTRAVENOUS | Status: AC
  Administered 2018-07-05: 500 mg via INTRAVENOUS
  Filled 2018-07-05 (×2): qty 100

## 2018-07-05 MED ORDER — SODIUM CHLORIDE 0.9 % IV BOLUS
1000.0000 mL | Freq: Once | INTRAVENOUS | Status: AC
  Administered 2018-07-05: 1000 mL via INTRAVENOUS

## 2018-07-05 NOTE — ED Notes (Signed)
Seizure pads in place

## 2018-07-05 NOTE — ED Provider Notes (Signed)
[facility] EMERGENCY DEPARTMENT Provider Note   CSN: [id number] Arrival date & time: 07/05/18  2059     History   Chief Complaint Chief Complaint  Patient presents with  . Seizures    HPI Brooke [name].     Family states patient had a seizure today.  Initially was unable to speak.  Patient has a history of seizures  The history is provided by the patient. No language interpreter was used.  Seizures Seizure activity on arrival: no   Seizure type:  Unable to specify Preceding symptoms: dizziness   Initial focality:  None Episode characteristics: abnormal movements   Postictal symptoms: confusion   Return to baseline: yes   Severity:  Moderate Timing:  Once Progression:  Improving Context: not alcohol withdrawal     Past Medical History:  Diagnosis Date  . Acid reflux   . Anxiety   . Cervical radiculitis   . Chronic constipation   . DDD (degenerative disc disease), lumbar   . Depression   . Dizzy spells   . Facial numbness   . Heart palpitations 01/2017  . History of hiatal hernia   . Hypertension   . Insomnia   . Iron deficiency anemia   . Migraines    occ  . Near syncope 01/2017  . Numbness and tingling in left arm   . Obesity   . Panic attacks   . Sciatica   . Seizures [facility])     Patient Active Problem List   Diagnosis Date Noted  . Small bowel obstruction ([place]) 07/20/2017  . 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 Hypersomnia due to another medical condition 06/12/2017  . Spells of speech arrest 01/23/2017  . Intractable migraine with visual aura and without status migrainosus 01/23/2017  . Sinus congestion 01/23/2017  . Morbid obesity with body mass index (BMI) of 40.0 to 44.9 in adult [facility]) 01/23/2017  . Sleep deprivation 01/23/2017  . Word finding difficulty 01/23/2017  . Morbid obesity ([place]) 12/03/2012  . LUQ pain 11/19/2012  . Esophageal dysphagia 11/19/2012  . Other malaise and fatigue 05/19/2012  .  Headache(784.0) 04/01/2012  . Amenorrhea 02/06/2012  . Depression 02/06/2012  . Panic attacks 02/06/2012  . Sinusitis 01/03/2012  . ANEMIA, IRON DEFICIENCY 05/05/2009  . NICOTINE ADDICTION 03/04/2009  . GERD 03/04/2009  . CONSTIPATION, CHRONIC 03/04/2009    Past Surgical History:  Procedure Laterality Date  . BUNIONECTOMY Left yrs ago  . COLONOSCOPY, ESOPHAGOGASTRODUODENOSCOPY (EGD) AND ESOPHAGEAL DILATION N/A 12/03/2012   [facility] melanosis throughout the entire examined colon/The colon IS redundant/Small internal hemorrhoids/EGD:Esophageal web/Medium sized hiatal hernia/MILD Non-erosive gastritis  . ESOPHAGOGASTRODUODENOSCOPY  03/09/09   Dr. Wilford Corner, normal EGD, s/p Bravo capsule placement  . GASTRIC ROUX-EN-Y N/A 07/16/2017   Procedure: LAPAROSCOPIC ROUX-EN-Y GASTRIC BYPASS WITH UPPER ENDOSCOPY AND ERAS PATHWAY;  Surgeon: Johnathan Hausen, MD;  Location: [facility] ORS;  Service: General;  Laterality: N/A;  . LAPAROSCOPY N/A 07/20/2017   Procedure: LAPAROSCOPY DIAGNOSTIC. REDUCTION OF SMALL BOWEL OBSTRUCTION. REPAIR OF TROCAR HERNIA.;  Surgeon: Alphonsa Overall, MD;  Location: [facility] ORS;  Service: General;  Laterality: N/A;  . TUBAL LIGATION    . WISDOM TOOTH EXTRACTION       OB History   No obstetric history on file.      Home Medications    Prior to Admission medications   Medication Sig Start Date End Date Taking? Authorizing Provider  acetaminophen (TYLENOL) 500 MG tablet Take 1,000 mg  by mouth daily as needed for moderate pain or headache.   Yes [provider]  Armodafinil 200 MG TABS TAKE 1 TABLET BY MOUTH EVERY MORNING Patient taking differently: Take 200 mg by mouth every morning.  02/05/18  Yes Dohmeier, Asencion Partridge, MD  Biotin 10000 MCG TABS Take 10,000 mcg by mouth daily.   Yes [provider]  buPROPion (WELLBUTRIN SR) 200 MG 12 hr tablet Take 200 mg by mouth 2 (two) times daily. 07/18/16  Yes [provider]  butalbital-aspirin-caffeine-codeine  (FIORINAL WITH CODEINE) 50-325-40-30 MG capsule TAKE 1 CAPSULE BY MOUTH WITH LOTS OF WATER.MAY REPEAT ONCE IN 3 HOURS. DO NOT EXCEED TWICE DAILY Patient taking differently: Take 1 capsule by mouth daily as needed for headache or migraine. Drink with lots of water but not to exceed more than twice daily use 05/12/18  Yes Dohmeier, Asencion Partridge, MD  CALCIUM PO Take 1 capsule by mouth daily.   Yes [provider]  Cholecalciferol (VITAMIN D) 2000 units CAPS Take 2,000 Units by mouth daily.   Yes [provider]  ferrous sulfate 325 (65 FE) MG EC tablet Take 325 mg by mouth daily with breakfast.   Yes [provider]  FLUoxetine (PROZAC) 40 MG capsule Take 40 mg by mouth daily.  08/24/16  Yes [provider]  gabapentin (NEURONTIN) 100 MG capsule Take 100 mg by mouth 3 (three) times daily as needed (for pain).    Yes [provider]  imipramine (TOFRANIL) 25 MG tablet Take 50 mg by mouth at bedtime. 05/29/18  Yes [provider]  levETIRAcetam (KEPPRA) 250 MG tablet Take 250 mg by mouth 2 (two) times daily.   Yes [provider]  pantoprazole (PROTONIX) 40 MG tablet Take 40 mg by mouth daily.   Yes [provider]  PREMPRO 0.45-1.5 MG tablet Take 1 tablet by mouth daily.  06/25/18  Yes [provider]  topiramate (TOPAMAX) 100 MG tablet Take 100 mg by mouth daily.   Yes [provider]    Family History Family History  Problem Relation Age of Onset  . Diabetes Mother   . Hypertension Mother   . Hypertension Father   . Diabetes Sister   . Hypertension Sister   . Hypertension Brother   . Diabetes Paternal Grandmother   . Hypertension Brother   . Hypertension Brother   . Cancer Maternal Grandmother        breast  . Asthma Other   . Heart disease Other   . Colon cancer Neg Hx     Social History Social History   Tobacco Use  . Smoking status: Former Smoker    Packs/day: 0.30    Years: 23.00    Pack years:  6.90    Types: Cigarettes  . Smokeless tobacco: Never Used  . Tobacco comment: less than 1/2 pack cigarettes daily  Substance Use Topics  . Alcohol use: Yes    Comment: rare  . Drug use: No     Allergies   Patient has no known allergies.   Review of Systems Review of Systems  Unable to perform ROS: Mental status change  Neurological: Positive for seizures.     Physical Exam Updated Vital Signs BP 110/78 (BP Location: Left Arm)   Pulse 98   Temp 97.8 F (36.6 C) (Oral)   Resp 17   Ht 5\' 2"  (1.575 m)   Wt 87.5 kg   SpO2 100%   BMI 35.30 kg/m   Physical Exam Nursing note  reviewed.  Constitutional:      Appearance: She is well-developed.  HENT:     Head: Normocephalic.     Nose: Nose normal.  Eyes:     General: No scleral icterus.    Conjunctiva/sclera: Conjunctivae normal.  Neck:     Musculoskeletal: Neck supple.     Thyroid: No thyromegaly.  Cardiovascular:     Rate and Rhythm: Normal rate and regular rhythm.     Heart sounds: No murmur. No friction rub. No gallop.   Pulmonary:     Breath sounds: No stridor. No wheezing or rales.  Chest:     Chest wall: No tenderness.  Abdominal:     General: There is no distension.     Tenderness: There is no abdominal tenderness. There is no rebound.  Musculoskeletal: Normal range of motion.  Lymphadenopathy:     Cervical: No cervical adenopathy.  Skin:    Findings: No erythema or rash.  Neurological:     Motor: No abnormal muscle tone.     Coordination: Coordination normal.     Comments: Patient initially would not speak.  After she had been in the emergency department for over an hour she was able to speak.  Patient did say that she felt dizzy and passed out.      ED Treatments / Results  Labs (all labs ordered are listed, but only abnormal results are displayed) Labs Reviewed  COMPREHENSIVE METABOLIC PANEL - Abnormal; Notable for the following components:      Result Value   CO2 18 (*)    BUN 21 (*)     Creatinine, Ser 1.36 (*)    Calcium 8.6 (*)    GFR calc non Af Amer 46 (*)    GFR calc Af Amer 53 (*)    All other components within normal limits  CBC WITH DIFFERENTIAL/PLATELET  CK  URINALYSIS, ROUTINE W REFLEX MICROSCOPIC    EKG EKG Interpretation  Date/Time:  Saturday July 05 2018 21:11:56 EDT Ventricular Rate:  94 PR Interval:    QRS Duration: 90 QT Interval:  357 QTC Calculation: 447 R Axis:   34 Text Interpretation:  Sinus rhythm Borderline T abnormalities, diffuse leads Confirmed by Milton Ferguson [phone]) on 07/05/2018 10:48:24 PM   Radiology Ct Head Wo Contrast  Result Date: 07/05/2018 CLINICAL DATA:  Altered level of consciousness (LOC), unexplained EXAM: CT HEAD WITHOUT CONTRAST TECHNIQUE: Contiguous axial images were obtained from the base of the skull through the vertex without intravenous contrast. COMPARISON:  Head CT and brain MRI 09/24/2016 FINDINGS: Brain: No intracranial hemorrhage, mass effect, or midline shift. No hydrocephalus. The basilar cisterns are patent. No evidence of territorial infarct or acute ischemia. No extra-axial or intracranial fluid collection. Slightly low lying cerebellar tonsils. Vascular: No hyperdense vessel Skull: No fracture or focal lesion. Sinuses/Orbits: Paranasal sinuses and mastoid air cells are clear. Left ocular lens is not visualized, as seen on prior, with dependent hyperdensity in the globe. Other: None. IMPRESSION: No acute intracranial abnormality. Electronically Signed   By: Keith Rake M.D.   On: 07/05/2018 22:29    Procedures Procedures (including critical care time)  Medications Ordered in ED Medications  sodium chloride 0.9 % bolus 1,000 mL (has no administration in time range)  levETIRAcetam (KEPPRA) IVPB 500 mg/100 mL premix (0 mg Intravenous Stopped 07/05/18 2224)     Initial Impression / Assessment and Plan / ED Course  I have reviewed the triage vital signs and the nursing notes.  Pertinent labs & imaging  results that were available during my care of the patient were reviewed by me and considered in my medical decision making (see chart for details).        Patient with a history of seizures.  She was dizzy and passed out.  This is typical for her seizures.  She takes Keppra.  Patient was given extra 500 mg of Keppra.  She has not remember how much she takes.  She will be discharged home to follow-up with her PCP  Final Clinical Impressions(s) / ED Diagnoses   Final diagnoses:  None    ED Discharge Orders    None       Milton Ferguson, MD 07/05/18 2251

## 2018-07-05 NOTE — ED Notes (Signed)
EKG done and given to Dr Roderic Palau @ 2118

## 2018-07-05 NOTE — ED Triage Notes (Addendum)
Pt family called EMS to come in stating pt has been going "in and out" several times starting 45 mins ago, pt has hx of seizures and has been taking Keppra x 1 yr, has not missed any meds per family, per [facility] v/s stable, CBG 113, pt follows commands during triage but does not answer questions verbally, shakes head to respond and holds up fingers to answer numeric related questions

## 2018-07-05 NOTE — ED Notes (Signed)
AC contacted for Keppra

## 2018-07-05 NOTE — ED Notes (Signed)
Patient made aware that we need a urine sample.

## 2018-07-05 NOTE — Discharge Instructions (Addendum)
Call your doctor Monday for follow-up appointment this week.  Drink plenty of fluids and rest over the weekend.  Return if any problems.  You may have a early urinary tract infection, take the antibiotics until gone.

## 2018-07-06 LAB — URINALYSIS, ROUTINE W REFLEX MICROSCOPIC
Bilirubin Urine: NEGATIVE
Glucose, UA: NEGATIVE mg/dL
Ketones, ur: 5 mg/dL — AB
Nitrite: NEGATIVE
Protein, ur: NEGATIVE mg/dL
Specific Gravity, Urine: 1.026 (ref 1.005–1.030)
pH: 5 (ref 5.0–8.0)

## 2018-07-06 MED ORDER — CEPHALEXIN 500 MG PO CAPS
500.0000 mg | ORAL_CAPSULE | Freq: Once | ORAL | Status: AC
  Administered 2018-07-06: 500 mg via ORAL
  Filled 2018-07-06: qty 1

## 2018-07-06 MED ORDER — CEPHALEXIN 500 MG PO CAPS: 500.0000 mg | ORAL_CAPSULE | Freq: Three times a day (TID) | ORAL | 0 refills | Status: DC

## 2018-07-06 NOTE — ED Provider Notes (Addendum)
Patient left a change of shift to check her UA results.  He could be discharged afterwards.  Urinalysis, Routine w reflex microscopic  Result Value Ref Range   Color, Urine YELLOW YELLOW   APPearance HAZY (A) CLEAR   Specific Gravity, Urine 1.026 1.005 - 1.030   pH 5.0 5.0 - 8.0   Glucose, UA NEGATIVE NEGATIVE mg/dL   Hgb urine dipstick MODERATE (A) NEGATIVE   Bilirubin Urine NEGATIVE NEGATIVE   Ketones, ur 5 (A) NEGATIVE mg/dL   Protein, ur NEGATIVE NEGATIVE mg/dL   Nitrite NEGATIVE NEGATIVE   Leukocytes,Ua TRACE (A) NEGATIVE   RBC / HPF 0-5 0 - 5 RBC/hpf   WBC, UA 21-50 0 - 5 WBC/hpf   Bacteria, UA RARE (A) NONE SEEN   Squamous Epithelial / LPF 0-5 0 - 5   Mucus PRESENT    Hyaline Casts, UA PRESENT     Patient's urine is suspicious for possible UTI, she does have some white blood cells and rare bacteria with trace leukocytes but negative nitrite.  Urine culture was sent and patient started on oral antibiotics.   Diagnoses that have been ruled out:  None  Diagnoses that are still under consideration:  None  Final diagnoses:  Seizure ([place])  Urinary tract infection without hematuria, site unspecified    ED Discharge Orders         Ordered    cephALEXin (KEFLEX) 500 MG capsule  3 times daily     07/06/18 0113          Plan discharge    Rolland Porter, MD 07/06/18 0040    Rolland Porter, MD 07/06/18 1224    Rolland Porter, MD 07/06/18 [phone]

## 2018-07-06 NOTE — ED Notes (Signed)
Pt ambulatory to waiting room. Pt verbalized understanding of discharge instructions.   

## 2018-07-07 LAB — URINE CULTURE
Culture: NO GROWTH
Special Requests: NORMAL

## 2018-07-08 ENCOUNTER — Other Ambulatory Visit: Payer: Self-pay

## 2018-07-08 ENCOUNTER — Encounter: Payer: Self-pay | Admitting: Neurology

## 2018-07-08 ENCOUNTER — Ambulatory Visit: Payer: BC Managed Care – PPO | Admitting: Neurology

## 2018-07-08 VITALS — BP 132/89 | HR 80 | Temp 97.7°F | Ht 62.0 in | Wt 197.0 lb

## 2018-07-08 DIAGNOSIS — F418 Other specified anxiety disorders: Secondary | ICD-10-CM | POA: Diagnosis not present

## 2018-07-08 DIAGNOSIS — G8929 Other chronic pain: Secondary | ICD-10-CM

## 2018-07-08 DIAGNOSIS — R404 Transient alteration of awareness: Secondary | ICD-10-CM | POA: Diagnosis not present

## 2018-07-08 DIAGNOSIS — F422 Mixed obsessional thoughts and acts: Secondary | ICD-10-CM | POA: Diagnosis not present

## 2018-07-08 DIAGNOSIS — F5104 Psychophysiologic insomnia: Secondary | ICD-10-CM

## 2018-07-08 DIAGNOSIS — R51 Headache: Secondary | ICD-10-CM

## 2018-07-08 MED ORDER — SERTRALINE HCL 25 MG PO TABS: 25.0000 mg | ORAL_TABLET | Freq: Every day | ORAL | 5 refills | Status: DC

## 2018-07-08 MED ORDER — LEVETIRACETAM ER 500 MG PO TB24: 500.0000 mg | ORAL_TABLET | Freq: Every day | ORAL | 1 refills | Status: DC
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
## 2018-07-08 NOTE — Patient Instructions (Signed)
Obsessive-Compulsive Disorder Obsessive-compulsive disorder (OCD) is a brain-based anxiety disorder. People with OCD have obsessions, compulsions, or both. Obsessions are unwanted and distressing thoughts, ideas, or urges that keep entering your mind and result in anxiety. You may find yourself trying to ignore them. You may try to stop or undo them with a compulsion. Compulsions are repetitive physical or mental acts that you feel you have to do. They may reduce or prevent any emotional distress, but in most instances, they are ineffective. Compulsions can be very time-consuming, often taking more than one hour each day. They can interfere with personal relationships and normal activities at home, school, or work. OCD can begin in childhood, but it usually starts in young adulthood and continues throughout life. Many people with OCD also have depression or another mental health disorder. What are the causes? The cause of this condition is not known. What increases the risk? This condition is more like to develop in:  People who have experienced trauma.  People who have a family history of OCD.  Women during and after pregnancy.  People who have infections and post-infectious autoimmune syndrome.  People who have other mental health conditions.  People who abuse substances. What are the signs or symptoms? Symptoms of OCD include compulsions and obsessions. People with obsessions usually have a fear that something terrible will happen or that they will do something terrible. Examples of common obsessions include:  Fear of contamination with germs, waste, or poisonous substances.  Fear of making the wrong decision.  Violent or sexual thoughts or urges towards others.  Need for symmetry or exactness. Examples of common compulsions include:  Excessive handwashing or bathing due to fear of contamination.  Checking things over and over to make sure you finished a task, such as  making sure you locked a door or unplugged a toaster.  Repeating an act or phrase over and over, sometimes a specific number of times, until it feels right.  Arranging and rearranging objects to keep them in a certain order.  Having a very hard time making a decision and sticking to it. How is this diagnosed?  OCD is diagnosed through an assessment by your health care provider. Your health care provider will ask questions about any obsessions or compulsions you have and how they affect your life. Your health care provider may also ask about your medical history, prescription medicines, and drug use. Certain medical conditions and substances can cause symptoms that are similar to OCD. Your health care provider may also refer you to a mental health specialist. How is this treated? Treatment may include:  Cognitive therapy. This is a form of talk therapy. The goal is to identify and change the irrational thoughts associated with obsessions.  Behavioral therapy. A type of behavioral therapy called exposure and response prevention is often used. In this therapy, you will be exposed to the distressing situation that triggers your compulsion and be prevented from responding to it. With repetition of this process over time, you will no longer feel the distress or need to perform the compulsion.  Self-soothing. Meditation, deep breathing, or yoga can help you manage the physiological symptoms of anxiety and can help with how you think.  Medicine. Certain types of antidepressant medicine may help reduce or control OCD symptoms. Medicine is most effective when used with cognitive or behavioral therapy. Treatment usually involves a combination of therapy and medicines. For severe OCD that does not respond to talk therapy and medicine, brain surgery  or electrical stimulation of specific areas of the brain (deep brain stimulation) may be considered. Follow these instructions at home:  Take over-the-counter  and prescription medicines only as told by your health care provider. Do not start taking any new medicines with approval from your health care provider.  Consider joining a support group for people with OCD.  Keep all follow-up visits as told by your health care provider. This is important. Contact a health care provider if:  You are not able to take your medicines as prescribed.  Your symptoms get worse. Get help right away if:  You have suicidal thoughts or thoughts about hurting yourself or others. If you ever feel like you may hurt yourself or others, or have thoughts about taking your own life, get help right away. You can go to your nearest emergency department or call:  Your local emergency services (911 in the [place]  A suicide crisis helpline, such as the [place] at 609-837-0113. This is open 24-hours a day. Summary  Obsessive-compulsive disorder (OCD) is a brain-based anxiety disorder. People with OCD have obsessions, compulsions, or both.  OCD can interfere with personal relationships and normal activities at home, school, or work.  Treatment usually involves a combination of therapy and medicines. This information is not intended to replace advice given to you by your health care provider. Make sure you discuss any questions you have with your health care provider. Document Released: 12/12/2000 Document Revised: 04/11/2018 Document Reviewed: 10/03/2015 Elsevier Patient Education  [place]. Levetiracetam extended-release tablets What is this medicine? LEVETIRACETAM (lee ve tye RA se tam) is an antiepileptic drug. It is used with other medicines to treat certain types of seizures. This medicine may be used for other purposes; ask your health care provider or pharmacist if you have questions. COMMON BRAND NAME(S): Keppra XR, Roweepra What should I tell my health care provider before I take this medicine? They need to know if you  have any of these conditions:  kidney disease  suicidal thoughts, plans, or attempt; a previous suicide attempt by you or a family member  an unusual or allergic reaction to levetiracetam, other medicines, foods, dyes, or preservatives  pregnant or trying to get pregnant  breast-feeding How should I use this medicine? Take this medicine by mouth with a glass of water. Follow the directions on the prescription label. Do not cut, crush or chew this medicine. You may take this medicine with or without food. Take your doses at regular intervals. Do not take your medicine more often than directed. Do not stop taking this medicine or any of your seizure medicines unless instructed by your doctor or health care professional. Stopping your medicine suddenly can increase your seizures or their severity. A special MedGuide will be given to you by the pharmacist with each prescription and refill. Be sure to read this information carefully each time. Contact your pediatrician or health care professional regarding the use of this medication in children. While this drug may be prescribed for children as young as 64 years of age for selected conditions, precautions do apply. Overdosage: If you think you have taken too much of this medicine contact a poison control center or emergency room at once. NOTE: This medicine is only for you. Do not share this medicine with others. What if I miss a dose? If you miss a dose and it has only been a few hours, take it as soon as you can. If it is almost time  for your next dose, take only that dose. Do not take double or extra doses. What may interact with this medicine? This medicine may interact with the following medications:  carbamazepine  colesevelam  probenecid  sevelamer This list may not describe all possible interactions. Give your health care provider a list of all the medicines, herbs, non-prescription drugs, or dietary supplements you use. Also tell  them if you smoke, drink alcohol, or use illegal drugs. Some items may interact with your medicine. What should I watch for while using this medicine? Visit your doctor or health care provider for a regular check on your progress. Wear a medical identification bracelet or chain to say you have epilepsy, and carry a card that lists all your medications. This medicine may cause serious skin reactions. They can happen weeks to months after starting the medicine. Contact your health care provider right away if you notice fevers or flu-like symptoms with a rash. The rash may be red or purple and then turn into blisters or peeling of the skin. Or, you might notice a red rash with swelling of the face, lips or lymph nodes in your neck or under your arms. It is important to take this medicine exactly as instructed by your health care provider. When first starting treatment, your dose may need to be adjusted. It may take weeks or months before your dose is stable. You should contact your doctor or health care provider if your seizures get worse or if you have any new types of seizures. You may get drowsy or dizzy. Do not drive, use machinery, or do anything that needs mental alertness until you know how this medicine affects you. Do not stand or sit up quickly, especially if you are an older patient. This reduces the risk of dizzy or fainting spells. Alcohol may interfere with the effect of this medicine. Avoid alcoholic drinks. The use of this medicine may increase the chance of suicidal thoughts or actions. Pay special attention to how you are responding while on this medicine. Any worsening of mood, or thoughts of suicide or dying should be reported to your health care provider right away. The tablet shell for some brands of this medicine does not dissolve. This is normal. The tablet shell may appear in the stool. This is not cause for concern. Women who become pregnant while using this medicine may enroll in the  [place] Pregnancy Registry by calling [id number]. This registry collects information about the safety of antiepileptic drug use during pregnancy. What side effects may I notice from receiving this medicine? Side effects that you should report to your doctor or health care professional as soon as possible:  allergic reactions like skin rash, itching or hives, swelling of the face, lips, or tongue  breathing problems  changes in emotions or moods  dark urine  general ill feeling or flu-like symptoms  problems with balance, talking, walking  rash, fever, and swollen lymph nodes  redness, blistering, peeling or loosening of the skin, including inside the mouth  suicidal thoughts or actions  unusually weak or tired  yellowing of the eyes or skin Side effects that usually do not require medical attention (report to your doctor or health care professional if they continue or are bothersome):  diarrhea  dizzy, drowsy  headache  loss of appetite This list may not describe all possible side effects. Call your doctor for medical advice about side effects. You may report side effects to FDA at 1-800-FDA-1088. Where  should I keep my medicine? Keep out of reach of children. Store at room temperature between 15 and 30 degrees C (59 and 86 degrees F). Throw away any unused medicine after the expiration date. NOTE: This sheet is a summary. It may not cover all possible information. If you have questions about this medicine, talk to your doctor, pharmacist, or health care provider.  2020 Elsevier/Gold Standard (2018-03-21 15:14:16) Sertraline tablets What is this medicine? SERTRALINE (SER tra leen) is used to treat depression. It may also be used to treat obsessive compulsive disorder, panic disorder, post-trauma stress, premenstrual dysphoric disorder (PMDD) or social anxiety. This medicine may be used for other purposes; ask your health care provider or  pharmacist if you have questions. COMMON BRAND NAME(S): Zoloft What should I tell my health care provider before I take this medicine? They need to know if you have any of these conditions:  bleeding disorders  bipolar disorder or a family history of bipolar disorder  glaucoma  heart disease  high blood pressure  history of irregular heartbeat  history of low levels of calcium, magnesium, or potassium in the blood  if you often drink alcohol  liver disease  receiving electroconvulsive therapy  seizures  suicidal thoughts, plans, or attempt; a previous suicide attempt by you or a family member  take medicines that treat or prevent blood clots  thyroid disease  an unusual or allergic reaction to sertraline, other medicines, foods, dyes, or preservatives  pregnant or trying to get pregnant  breast-feeding How should I use this medicine? Take this medicine by mouth with a glass of water. Follow the directions on the prescription label. You can take it with or without food. Take your medicine at regular intervals. Do not take your medicine more often than directed. Do not stop taking this medicine suddenly except upon the advice of your doctor. Stopping this medicine too quickly may cause serious side effects or your condition may worsen. A special MedGuide will be given to you by the pharmacist with each prescription and refill. Be sure to read this information carefully each time. Talk to your pediatrician regarding the use of this medicine in children. While this drug may be prescribed for children as young as 7 years for selected conditions, precautions do apply. Overdosage: If you think you have taken too much of this medicine contact a poison control center or emergency room at once. NOTE: This medicine is only for you. Do not share this medicine with others. What if I miss a dose? If you miss a dose, take it as soon as you can. If it is almost time for your next dose,  take only that dose. Do not take double or extra doses. What may interact with this medicine? Do not take this medicine with any of the following medications:  cisapride  dronedarone  linezolid  MAOIs like Carbex, Eldepryl, Marplan, Nardil, and Parnate  methylene blue (injected into a vein)  pimozide  thioridazine This medicine may also interact with the following medications:  alcohol  amphetamines  aspirin and aspirin-like medicines  certain medicines for depression, anxiety, or psychotic disturbances  certain medicines for fungal infections like ketoconazole, fluconazole, posaconazole, and itraconazole  certain medicines for irregular heart beat like flecainide, quinidine, propafenone  certain medicines for migraine headaches like almotriptan, eletriptan, frovatriptan, naratriptan, rizatriptan, sumatriptan, zolmitriptan  certain medicines for sleep  certain medicines for seizures like carbamazepine, valproic acid, phenytoin  certain medicines that treat or prevent blood clots like warfarin, enoxaparin,  dalteparin  cimetidine  digoxin  diuretics  fentanyl  isoniazid  lithium  NSAIDs, medicines for pain and inflammation, like ibuprofen or naproxen  other medicines that prolong the QT interval (cause an abnormal heart rhythm) like dofetilide  rasagiline  safinamide  supplements like St. John's wort, kava kava, valerian  tolbutamide  tramadol  tryptophan This list may not describe all possible interactions. Give your health care provider a list of all the medicines, herbs, non-prescription drugs, or dietary supplements you use. Also tell them if you smoke, drink alcohol, or use illegal drugs. Some items may interact with your medicine. What should I watch for while using this medicine? Tell your doctor if your symptoms do not get better or if they get worse. Visit your doctor or health care professional for regular checks on your progress. Because it  may take several weeks to see the full effects of this medicine, it is important to continue your treatment as prescribed by your doctor. Patients and their families should watch out for new or worsening thoughts of suicide or depression. Also watch out for sudden changes in feelings such as feeling anxious, agitated, panicky, irritable, hostile, aggressive, impulsive, severely restless, overly excited and hyperactive, or not being able to sleep. If this happens, especially at the beginning of treatment or after a change in dose, call your health care professional. Dennis Bast may get drowsy or dizzy. Do not drive, use machinery, or do anything that needs mental alertness until you know how this medicine affects you. Do not stand or sit up quickly, especially if you are an older patient. This reduces the risk of dizzy or fainting spells. Alcohol may interfere with the effect of this medicine. Avoid alcoholic drinks. Your mouth may get dry. Chewing sugarless gum or sucking hard candy, and drinking plenty of water may help. Contact your doctor if the problem does not go away or is severe. What side effects may I notice from receiving this medicine? Side effects that you should report to your doctor or health care professional as soon as possible:  allergic reactions like skin rash, itching or hives, swelling of the face, lips, or tongue  anxious  black, tarry stools  changes in vision  confusion  elevated mood, decreased need for sleep, racing thoughts, impulsive behavior  eye pain  fast, irregular heartbeat  feeling faint or lightheaded, falls  feeling agitated, angry, or irritable  hallucination, loss of contact with reality  loss of balance or coordination  loss of memory  painful or prolonged erections  restlessness, pacing, inability to keep still  seizures  stiff muscles  suicidal thoughts or other mood changes  trouble sleeping  unusual bleeding or bruising  unusually weak  or tired  vomiting Side effects that usually do not require medical attention (report to your doctor or health care professional if they continue or are bothersome):  change in appetite or weight  change in sex drive or performance  diarrhea  increased sweating  indigestion, nausea  tremors This list may not describe all possible side effects. Call your doctor for medical advice about side effects. You may report side effects to FDA at 1-800-FDA-1088. Where should I keep my medicine? Keep out of the reach of children. Store at room temperature between 15 and 30 degrees C (59 and 86 degrees F). Throw away any unused medicine after the expiration date. NOTE: This sheet is a summary. It may not cover all possible information. If you have questions about this medicine, talk  to your doctor, pharmacist, or health care provider.  2020 Elsevier/Gold Standard (2017-12-10 10:09:27)

## 2018-07-09 ENCOUNTER — Telehealth: Payer: Self-pay | Admitting: Neurology

## 2018-07-09 NOTE — Telephone Encounter (Signed)
Patient has three Seizure back to back after 2:00 pm today. Patient stated seizures lasted about one hour.

## 2018-07-10 ENCOUNTER — Encounter: Payer: Self-pay | Admitting: Neurology

## 2018-07-10 NOTE — Telephone Encounter (Signed)
Called the patient and discussed that Dr Brett Fairy doesn't feel that the patient changing form BID Keppra to XR once a day Keppra wouldn't cause any problems. She really feels like the increases stress levels could be causing the increase in this. She would recommend getting set up with the referral to Behavior health to evaluate cognitive behavior therapies. Also we can offer a referral for a 2nd opinion to [facility] neurology

## 2018-07-10 NOTE — Telephone Encounter (Signed)
Medication was just changed at the recent office visit for the XR form of Keppra 500 mg once a day. Will inform Dr Brett Fairy of this and ask thoughts on when new medication should begin to take effect.

## 2018-07-10 NOTE — Telephone Encounter (Signed)
I will ask Dr Delice Lesch for a consultation- .

## 2018-07-10 NOTE — Telephone Encounter (Signed)
Patient had requested a letter be written as she is in the process of attemping to get disability. Advised the patient that I can provide a letter that indicates that according to state [place] laws the patient cant drive. This is in effect typically 6 mths from the last seizure. Advised the patient to keep [place] updated via mychart when a seizure episode happens. Informed her to let us know if we need to place a referral for the 2nd opinion with neurologist that specializes in seizures. Pt verbalized understanding. Letter written and will mail to patient once Dr Dohmeier signs.

## 2018-07-11 NOTE — Telephone Encounter (Signed)
I will have our front staff put her on the schedule. Thanks!

## 2018-07-11 NOTE — Telephone Encounter (Signed)
Patient is sch for 2nd opinion on 07-29-18 at 10:30 with Dr Delice Lesch thank you for the referral

## 2018-07-15 NOTE — Telephone Encounter (Signed)
Referral order was placed on 07/08/2018.

## 2018-07-15 NOTE — Telephone Encounter (Signed)
Patient called to inquire about behavorial health referral. Please advise.

## 2018-07-16 NOTE — Telephone Encounter (Signed)
Called [facility] and they relayed they reached out to patient and patient never called back. I called patient and spoke with her and gave her number to schedule [phone] or [phone] opt 2 . Patient is going to call and get her apt scheduled.
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
## 2018-07-26 ENCOUNTER — Other Ambulatory Visit: Payer: Self-pay

## 2018-07-26 ENCOUNTER — Encounter (HOSPITAL_COMMUNITY): Payer: Self-pay

## 2018-07-26 ENCOUNTER — Emergency Department (HOSPITAL_COMMUNITY)
Admission: EM | Admit: 2018-07-26 | Discharge: 2018-07-26 | Disposition: A | Discharge status: Home / Self Care | Payer: BC Managed Care – PPO | Attending: Emergency Medicine | Admitting: Emergency Medicine

## 2018-07-26 DIAGNOSIS — R569 Unspecified convulsions: Secondary | ICD-10-CM

## 2018-07-26 DIAGNOSIS — Z79899 Other long term (current) drug therapy: Secondary | ICD-10-CM | POA: Diagnosis not present

## 2018-07-26 DIAGNOSIS — G40909 Epilepsy, unspecified, not intractable, without status epilepticus: Secondary | ICD-10-CM | POA: Diagnosis not present

## 2018-07-26 DIAGNOSIS — Z87891 Personal history of nicotine dependence: Secondary | ICD-10-CM | POA: Insufficient documentation

## 2018-07-26 DIAGNOSIS — I10 Essential (primary) hypertension: Secondary | ICD-10-CM | POA: Diagnosis not present

## 2018-07-26 LAB — BASIC METABOLIC PANEL
Anion gap: 7 (ref 5–15)
BUN: 10 mg/dL (ref 6–20)
CO2: 21 mmol/L — ABNORMAL LOW (ref 22–32)
Calcium: 8.4 mg/dL — ABNORMAL LOW (ref 8.9–10.3)
Chloride: 112 mmol/L — ABNORMAL HIGH (ref 98–111)
Creatinine, Ser: 0.92 mg/dL (ref 0.44–1.00)
GFR calc Af Amer: >60 mL/min (ref >60)
GFR calc non Af Amer: >60 mL/min (ref >60)
Glucose, Bld: 96 mg/dL (ref 70–99)
Potassium: 4 mmol/L (ref 3.5–5.1)
Sodium: 140 mmol/L (ref 135–145)

## 2018-07-26 LAB — CBC
HCT: 39.4 % (ref 36.0–46.0)
Hemoglobin: 12.8 g/dL (ref 12.0–15.0)
MCH: 30.8 pg (ref 26.0–34.0)
MCHC: 32.5 g/dL (ref 30.0–36.0)
MCV: 94.7 fL (ref 80.0–100.0)
Platelets: 337 10*3/uL (ref 150–400)
RBC: 4.16 MIL/uL (ref 3.87–5.11)
RDW: 13.7 % (ref 11.5–15.5)
WBC: 5.4 10*3/uL (ref 4.0–10.5)
nRBC: 0 % (ref 0.0–0.2)

## 2018-07-26 MED ORDER — LEVETIRACETAM IN NACL 500 MG/100ML IV SOLN
500.0000 mg | Freq: Once | INTRAVENOUS | Status: AC
  Administered 2018-07-26: 500 mg via INTRAVENOUS
  Filled 2018-07-26 (×2): qty 100

## 2018-07-26 MED ORDER — LORAZEPAM 2 MG/ML IJ SOLN
1.0000 mg | Freq: Once | INTRAMUSCULAR | Status: AC
  Administered 2018-07-26: 06:00:00 1 mg via INTRAVENOUS
  Filled 2018-07-26: qty 1

## 2018-07-26 NOTE — ED Provider Notes (Signed)
[facility] Provider Note   CSN: [id number] Arrival date & time: 07/26/18  0444    History   Chief Complaint Chief Complaint  Patient presents with  . Seizures    HPI Brooke [name].     Patient presents to the emergency department for evaluation of seizure.  Patient has a history of seizure disorder.  Husband reports 3 seizures today.  Seizures are typical generalized seizures for her.  She has been experiencing increased stress and has had decreased sleep recently.  Her neurologist recently changed her from [place] 250 mg twice a day to 500 mg once a day.     Past Medical History:  Diagnosis Date  . Acid reflux   . Anxiety   . Cervical radiculitis   . Chronic constipation   . DDD (degenerative disc disease), lumbar   . Depression   . Dizzy spells   . Facial numbness   . Heart palpitations 01/2017  . History of hiatal hernia   . Hypertension   . Insomnia   . Iron deficiency anemia   . Migraines    occ  . Near syncope 01/2017  . Numbness and tingling in left arm   . Obesity   . Panic attacks   . Sciatica   . Seizures [facility])     Patient Active Problem List   Diagnosis Date Noted  . Small bowel obstruction ([place]) 07/20/2017  . 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 Hypersomnia due to another medical condition 06/12/2017  . Spells of speech arrest 01/23/2017  . Intractable migraine with visual aura and without status migrainosus 01/23/2017  . Sinus congestion 01/23/2017  . Morbid obesity with body mass index (BMI) of 40.0 to 44.9 in adult [facility]) 01/23/2017  . Sleep deprivation 01/23/2017  . Word finding difficulty 01/23/2017  . Morbid obesity ([place]) 12/03/2012  . LUQ pain 11/19/2012  . Esophageal dysphagia 11/19/2012  . Other malaise and fatigue 05/19/2012  . Headache(784.0) 04/01/2012  . Amenorrhea 02/06/2012  . Depression 02/06/2012  . Panic attacks 02/06/2012  . Sinusitis 01/03/2012  . ANEMIA,  IRON DEFICIENCY 05/05/2009  . NICOTINE ADDICTION 03/04/2009  . GERD 03/04/2009  . CONSTIPATION, CHRONIC 03/04/2009    Past Surgical History:  Procedure Laterality Date  . BUNIONECTOMY Left yrs ago  . COLONOSCOPY, ESOPHAGOGASTRODUODENOSCOPY (EGD) AND ESOPHAGEAL DILATION N/A 12/03/2012   PYK:DXIPJASN melanosis throughout the entire examined colon/The colon IS redundant/Small internal hemorrhoids/EGD:Esophageal web/Medium sized hiatal hernia/MILD Non-erosive gastritis  . ESOPHAGOGASTRODUODENOSCOPY  03/09/09   Dr. Wilford Corner, normal EGD, s/p Bravo capsule placement  . GASTRIC ROUX-EN-Y N/A 07/16/2017   Procedure: LAPAROSCOPIC ROUX-EN-Y GASTRIC BYPASS WITH UPPER ENDOSCOPY AND ERAS PATHWAY;  Surgeon: Johnathan Hausen, MD;  Location[facility];  Service: General;  Laterality: N/A;  . LAPAROSCOPY N/A 07/20/2017   Procedure: LAPAROSCOPY DIAGNOSTIC. REDUCTION OF SMALL BOWEL OBSTRUCTION. REPAIR OF TROCAR HERNIA.;  Surgeon: Alphonsa Overall, MD;  Location: [facility];  Service: General;  Laterality: N/A;  . TUBAL LIGATION    . WISDOM TOOTH EXTRACTION       OB History   No obstetric history on file.      Home Medications    Prior to Admission medications   Medication Sig Start Date End Date Taking? Authorizing Provider  acetaminophen (TYLENOL) 500 MG tablet Take 1,000 mg by mouth daily as needed for moderate pain or headache.    [provider]  ALPRAZolam Duanne Moron) 0.5 MG tablet Take 0.5  mg by mouth at bedtime as needed for anxiety.    [provider]  Armodafinil 200 MG TABS TAKE 1 TABLET BY MOUTH EVERY MORNING Patient taking differently: Take 200 mg by mouth every morning.  02/05/18   Dohmeier, Asencion Partridge, MD  Biotin 10000 MCG TABS Take 10,000 mcg by mouth daily.    [provider]  butalbital-aspirin-caffeine-codeine (FIORINAL WITH CODEINE) 50-325-40-30 MG capsule TAKE 1 CAPSULE BY MOUTH WITH LOTS OF WATER.MAY REPEAT ONCE IN 3 HOURS. DO NOT EXCEED TWICE DAILY Patient taking  differently: Take 1 capsule by mouth daily as needed for headache or migraine. Drink with lots of water but not to exceed more than twice daily use 05/12/18   Dohmeier, Asencion Partridge, MD  CALCIUM PO Take 1 capsule by mouth daily.    [provider]  cephALEXin (KEFLEX) 500 MG capsule Take 1 capsule (500 mg total) by mouth 3 (three) times daily. 07/06/18   Rolland Porter, MD  Cholecalciferol (VITAMIN D) 2000 units CAPS Take 2,000 Units by mouth daily.    [provider]  ferrous sulfate 325 (65 FE) MG EC tablet Take 325 mg by mouth daily with breakfast.    [provider]  FLUoxetine (PROZAC) 40 MG capsule Take 40 mg by mouth daily.  08/24/16   [provider]  gabapentin (NEURONTIN) 100 MG capsule Take 100 mg by mouth 3 (three) times daily as needed (for pain).     [provider]  levETIRAcetam (KEPPRA XR) 500 MG 24 hr tablet Take 1 tablet (500 mg total) by mouth daily. 07/08/18   Dohmeier, Asencion Partridge, MD  levocetirizine (XYZAL) 5 MG tablet Take 5 mg by mouth every evening.    [provider]  pantoprazole (PROTONIX) 40 MG tablet Take 40 mg by mouth daily.    [provider]  PREMPRO 0.45-1.5 MG tablet Take 1 tablet by mouth daily.  06/25/18   [provider]  sertraline (ZOLOFT) 25 MG tablet Take 1 tablet (25 mg total) by mouth daily. 07/08/18   Dohmeier, Asencion Partridge, MD  topiramate (TOPAMAX) 100 MG tablet Take 100 mg by mouth daily.    [provider]  traMADol (ULTRAM) 50 MG tablet Take by mouth daily as needed.    [provider]    Family History Family History  Problem Relation Age of Onset  . Diabetes Mother   . Hypertension Mother   . Hypertension Father   . Diabetes Sister   . Hypertension Sister   . Hypertension Brother   . Diabetes Paternal Grandmother   . Hypertension Brother   . Hypertension Brother   . Cancer Maternal Grandmother        breast  . Asthma Other   . Heart disease Other   . Colon cancer Neg Hx      Social History Social History   Tobacco Use  . Smoking status: Former Smoker    Packs/day: 0.30    Years: 23.00    Pack years: 6.90    Types: Cigarettes  . Smokeless tobacco: Never Used  . Tobacco comment: less than 1/2 pack cigarettes daily  Substance Use Topics  . Alcohol use: Yes    Comment: rare  . Drug use: No     Allergies   Patient has no known allergies.   Review of Systems Review of Systems  Neurological: Positive for seizures.  Psychiatric/Behavioral: Positive for sleep disturbance.  All other systems reviewed and are negative.    Physical Exam Updated Vital Signs BP (!) 145/79 (  BP Location: Left Arm)   Pulse (!) 55   Temp 98.1 F (36.7 C) (Oral)   Resp 15   Ht 5\' 2"  (1.575 m)   Wt 86.2 kg   LMP 07/02/2018   SpO2 100%   BMI 34.75 kg/m   Physical Exam Vitals signs and nursing note reviewed.  Constitutional:      General: She is not in acute distress.    Appearance: Normal appearance. She is well-developed.  HENT:     Head: Normocephalic and atraumatic.     Right Ear: Hearing normal.     Left Ear: Hearing normal.     Nose: Nose normal.  Eyes:     Conjunctiva/sclera: Conjunctivae normal.     Pupils: Pupils are equal, round, and reactive to light.  Neck:     Musculoskeletal: Normal range of motion and neck supple.  Cardiovascular:     Rate and Rhythm: Regular rhythm.     Heart sounds: S1 normal and S2 normal. No murmur. No friction rub. No gallop.   Pulmonary:     Effort: Pulmonary effort is normal. No respiratory distress.     Breath sounds: Normal breath sounds.  Chest:     Chest wall: No tenderness.  Abdominal:     General: Bowel sounds are normal.     Palpations: Abdomen is soft.     Tenderness: There is no abdominal tenderness. There is no guarding or rebound. Negative signs include Murphy's sign and McBurney's sign.     Hernia: No hernia is present.  Musculoskeletal: Normal range of motion.  Skin:    General: Skin is warm and  dry.     Findings: No rash.  Neurological:     Mental Status: She is alert and oriented to person, place, and time.     GCS: GCS eye subscore is 4. GCS verbal subscore is 5. GCS motor subscore is 6.     Cranial Nerves: No cranial nerve deficit.     Sensory: No sensory deficit.     Coordination: Coordination normal.  Psychiatric:        Speech: Speech normal.        Behavior: Behavior normal.        Thought Content: Thought content normal.      ED Treatments / Results  Labs (all labs ordered are listed, but only abnormal results are displayed) Labs Reviewed  CBC  BASIC METABOLIC PANEL    EKG None  Radiology No results found.  Procedures Procedures (including critical care time)  Medications Ordered in ED Medications  LORazepam (ATIVAN) injection 1 mg (has no administration in time range)  levETIRAcetam (KEPPRA) IVPB 500 mg/100 mL premix (has no administration in time range)     Initial Impression / Assessment and Plan / ED Course  I have reviewed the triage vital signs and the nursing notes.  Pertinent labs & imaging results that were available during my care of the patient were reviewed by me and considered in my medical decision making (see chart for details).        Patient appears well at arrival.  She is at her normal neurologic baseline currently.  She does, however, report 3 seizures today.  She has had recent breakthrough seizures looking at her records.  Her work-up has been unrevealing by neurology.  Normal MRI, normal MRA.  Normal EEG.  She was seen 2 weeks ago and at that time neurology was speculating that her episodes were nonepileptic.  She was changed to extended  release Keppra 500mg  once a day, which is still a relatively low dose.  Will recommend she increase to 2 tablets twice a day and follow-up with her neurologist after the weekend.  Final Clinical Impressions(s) / ED Diagnoses   Final diagnoses:  Seizure [facility])    ED Discharge Orders     None       Orpah Greek, MD 07/26/18 [phone]

## 2018-07-26 NOTE — Discharge Instructions (Addendum)
Start taking 2 of the new Keppra tablets a day and call Dr. Brett Fairy Monday morning for further instructions and follow-up.

## 2018-07-26 NOTE — ED Triage Notes (Signed)
Keppra dose changed recently, has had 3 seizures since 2230 last night witnessed per husband.  Pt denies injury or complaint, is awake and alert.

## 2018-07-29 ENCOUNTER — Other Ambulatory Visit: Payer: Self-pay

## 2018-07-29 ENCOUNTER — Telehealth (INDEPENDENT_AMBULATORY_CARE_PROVIDER_SITE_OTHER): Payer: BC Managed Care – PPO | Admitting: Neurology

## 2018-07-29 ENCOUNTER — Telehealth: Payer: Self-pay | Admitting: Neurology

## 2018-07-29 ENCOUNTER — Encounter: Payer: Self-pay | Admitting: Neurology

## 2018-07-29 VITALS — Ht 62.0 in | Wt 190.0 lb

## 2018-07-29 DIAGNOSIS — R404 Transient alteration of awareness: Secondary | ICD-10-CM | POA: Diagnosis not present

## 2018-07-29 DIAGNOSIS — F5104 Psychophysiologic insomnia: Secondary | ICD-10-CM
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
## 2018-07-29 NOTE — Telephone Encounter (Signed)
Called the patient back. Pt did have a visit with Dr Delice Lesch today. Patient states there was no discussion about medication changes. She is ordering a 3 day EEG to have completed. The Keppra 500 XR the patient has been taking this once a day until she went in the hospital recently and they advised her to take twice a day. I advised that Dr Brett Fairy recommended she take a 2nd dose this evening but tomorrow am she would like for her to take 2 tab together to equal 1000 XR in am. Advised the patient that I will update this change in the computer. The husband states that she has also been struggling with sleeping at night. I have advised to attempt a natural sleep aid over the counter melatonin 5 mg or less. It comes in sublingual form. Advised the patient to attempt this dosage and see if this will help. In the meantime I will also advise Dr Brett Fairy and see if she has any other thoughts or recommendations on her sz medication or something to help with sleep. Pt verbalized understanding. Advised we will also wait to hear Dr Aquino's thoughts on seizures.

## 2018-07-29 NOTE — Telephone Encounter (Signed)
Pt husband has called to inform pt seizure medication is not working. Pt is not feeling well, husband states while on call pt is currently having a seizure.  Husband was asked if he feels he should take pt to ED. He said pt asked that she calls Dr Brett Fairy.  Husband is asking for a call from RN

## 2018-07-30 ENCOUNTER — Encounter (HOSPITAL_COMMUNITY): Payer: Self-pay | Admitting: Psychiatry

## 2018-07-30 ENCOUNTER — Other Ambulatory Visit: Payer: Self-pay

## 2018-07-30 ENCOUNTER — Encounter: Payer: Self-pay | Admitting: Neurology

## 2018-07-30 ENCOUNTER — Ambulatory Visit (INDEPENDENT_AMBULATORY_CARE_PROVIDER_SITE_OTHER): Payer: BC Managed Care – PPO | Admitting: Psychiatry

## 2018-07-30 DIAGNOSIS — F422 Mixed obsessional thoughts and acts: Secondary | ICD-10-CM

## 2018-07-30 DIAGNOSIS — F330 Major depressive disorder, recurrent, mild: Secondary | ICD-10-CM | POA: Diagnosis not present

## 2018-07-30 DIAGNOSIS — F5104 Psychophysiologic insomnia: Secondary | ICD-10-CM

## 2018-07-30 DIAGNOSIS — G43119 Migraine with aura, intractable, without status migrainosus: Secondary | ICD-10-CM

## 2018-07-30 DIAGNOSIS — R404 Transient alteration of awareness: Secondary | ICD-10-CM

## 2018-07-30 NOTE — Patient Instructions (Signed)
1. Increase sertraline 50 mg at night 2. Referral to therapy  3. Next appointment: 8/26 at 8:40

## 2018-07-30 NOTE — Telephone Encounter (Signed)
I got dr aquino's message - patient has to follow up with her.

## 2018-07-30 NOTE — Telephone Encounter (Signed)
XR from of keppra is 24 hours active- that means she can take both once a day at the same time. AM -

## 2018-08-04 ENCOUNTER — Telehealth: Payer: Self-pay | Admitting: Neurology

## 2018-08-04 NOTE — Telephone Encounter (Signed)
Pls have her increase the Keppra XR 500mg : take 2 tabs in AM, 1 tab in PM for now until we do the EEG. Thanks

## 2018-08-04 NOTE — Telephone Encounter (Signed)
Patient stats that she had 4 back to back seizures yesterday and they lasted 45 minutes combined. She states that the last one was different from the others. She jerked more and was stiff, she is taking the keppra medication and would like  a phone call back

## 2018-08-04 NOTE — Telephone Encounter (Signed)
Pt informed to increase Keppra. Made aware to keep EEG as scheduled.

## 2018-08-04 NOTE — Telephone Encounter (Signed)
Called the patient back and advised that per previous conversation with Dr Brett Fairy in regards to her seizure medication she encourages her to follow up with medication management for the seizures through Dr Delice Lesch. Made the patient aware that Dr Brett Fairy is out of the office until 08/18/2018. Pt verbalized understanding and will contact Dr Aquino's office.

## 2018-08-04 NOTE — Telephone Encounter (Signed)
Pt called in and stated she had 4 seizures on yesterday , she states she has been taken meds as instructed , but these 4 seizures were very different than the last couple of time.

## 2018-08-04 NOTE — Telephone Encounter (Signed)
Left message with after hour service on 08-04-18 @ 12:21 pm    Caller needs a call back to clarify her medication orders please call

## 2018-08-04 NOTE — Telephone Encounter (Signed)
Pt c/o: seizure Missed medications?  No. Sleep deprived?  Yes.   Alcohol intake?  No. Just social. Last drink months ago. Back to their usual baseline self?  Yes.  . If no, advise go to ER Current medications prescribed by Dr. Delice Lesch: Keppra 500mg  BID Pt currently takes all at once Dr. Beacher [facility]) instructed pt to take this way.  Taking Zoloft at bedtime- psych up to 50mg  qhs. Only sleeps 3-4 hours nightly. Unsure why. Psych thinks OCD.

## 2018-08-05 ENCOUNTER — Other Ambulatory Visit: Payer: Self-pay

## 2018-08-05 ENCOUNTER — Encounter (HOSPITAL_COMMUNITY): Payer: Self-pay | Admitting: Psychiatry

## 2018-08-05 ENCOUNTER — Ambulatory Visit (INDEPENDENT_AMBULATORY_CARE_PROVIDER_SITE_OTHER): Payer: BC Managed Care – PPO | Admitting: Psychiatry

## 2018-08-05 DIAGNOSIS — F330 Major depressive disorder, recurrent, mild: Secondary | ICD-10-CM

## 2018-08-06 ENCOUNTER — Telehealth: Payer: Self-pay | Admitting: Neurology

## 2018-08-06 ENCOUNTER — Telehealth (HOSPITAL_COMMUNITY): Payer: Self-pay | Admitting: *Deleted
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
## 2018-08-06 NOTE — Telephone Encounter (Signed)
Pt c/o: seizure Missed medications? No Sleep deprived?  Yes.  only sleeps about 3-4 hours/night; up since 0400 today Alcohol intake?  No. Back to their usual baseline self?  Yes.  . If no, advise go to ER  Current medications prescribed by Dr. Delice Lesch:  Keppra XR was increased by Dr. Delice Lesch two days ago to 500mg  2 in am and 1 in pm. Patient stated she started taking that higher dose that day.   Zoloft was increased to 50mg  [facility] per psych.   Patient stated she had two "blackout" seizures back to back this am that combined lasted 15-20 minutes. She stated is having a hard time with her anxiety and panic attacks. She was "feeling bad" earlier today with chest tightness and shortness of breath before the seizures but it is worse afterwards. States normally her panic attack symptoms don't last this long (approximately 30-45 minutes ago had seizures). (Clairified with patient regarding chest tightness - no sweating or arm pain.)   Aware of her EEG appt on 8/17. Informed patient will let MD know of seizure/panic attack/anxiety and call her back.

## 2018-08-06 NOTE — Telephone Encounter (Signed)
PATIENT CALLED  & STATED THAT AS PREVIOUS DISCUSSED LAST VISIT  : Increase sertraline 50 mg at night. PATIENT STATED SHE HAD A VERY BAD PANIC /ANXIETY ATTACK & WHEN SHE HAS THEM SHE DOES HAVE  SEIZURE'S SHE HAD ONE THIS AM. SHE NOTIFIED THE NEUROLOGIST THAT INFORMED HER TO NOTIFY THAT HER PANIC/ANXIETY ATTACKS CAUSES HE TO HAVE SEIZURES. PATIENT ASK FOR A CALL BACK [phone] SHE'S CONCERNED NOT SURE WHAT TO DO?

## 2018-08-06 NOTE — Telephone Encounter (Signed)
Called patient and made her aware of Dr. Amparo Bristol advice: " Pls let her know that her symptoms are more concerning for stress/anxiety related seizures with the tightness and shortness of breath, would proceed with the EEG scheduled but would call her psychiatrist about how she is feeling with the anxiety and see if there is something else she can take. "  Patient verbalized understanding and said she will be here for the scheduled EEG and will follow up with her psychiatrist today.

## 2018-08-06 NOTE — Telephone Encounter (Signed)
It is hard to tell whether increasing the medication caused her those symptoms. Having said that, advise her to reduce sertraline back to 25 mg daily at this time to see if gradual uptitration of medication would be helpful for her.

## 2018-08-06 NOTE — Telephone Encounter (Signed)
Pls let her know that her symptoms are more concerning for stress/anxiety related seizures with the tightness and shortness of breath, would proceed with the EEG scheduled but would call her psychiatrist about how she is feeling with the anxiety and see if there is something else she can take.

## 2018-08-06 NOTE — Telephone Encounter (Signed)
Patient is calling in that she had 2 blackout seizures this morning and is having a hard time with her anxiety. She is having panic attacks and was wanting to talk with nurse. Thanks!

## 2018-08-06 NOTE — Telephone Encounter (Signed)
SPOKE WITH PATIENT & INFORMED PER PROVIDER: It is hard to tell whether increasing the medication caused her those symptoms. Having said that, advise her to reduce sertraline back to 25 mg daily at this time to see if gradual uptitration of medication would be helpful for her.

## 2018-08-08 ENCOUNTER — Emergency Department (HOSPITAL_COMMUNITY): Payer: BC Managed Care – PPO

## 2018-08-08 ENCOUNTER — Other Ambulatory Visit: Payer: Self-pay

## 2018-08-08 ENCOUNTER — Emergency Department (HOSPITAL_COMMUNITY)
Admission: EM | Admit: 2018-08-08 | Discharge: 2018-08-08 | Disposition: A | Discharge status: Home / Self Care | Payer: BC Managed Care – PPO | Attending: Emergency Medicine | Admitting: Emergency Medicine

## 2018-08-08 ENCOUNTER — Encounter (HOSPITAL_COMMUNITY): Payer: Self-pay | Admitting: Emergency Medicine

## 2018-08-08 DIAGNOSIS — R079 Chest pain, unspecified: Secondary | ICD-10-CM | POA: Diagnosis not present

## 2018-08-08 DIAGNOSIS — M79604 Pain in right leg: Secondary | ICD-10-CM | POA: Insufficient documentation

## 2018-08-08 DIAGNOSIS — R569 Unspecified convulsions: Secondary | ICD-10-CM | POA: Insufficient documentation

## 2018-08-08 DIAGNOSIS — R0789 Other chest pain: Secondary | ICD-10-CM | POA: Diagnosis not present

## 2018-08-08 DIAGNOSIS — R233 Spontaneous ecchymoses: Secondary | ICD-10-CM | POA: Insufficient documentation

## 2018-08-08 DIAGNOSIS — Z79899 Other long term (current) drug therapy: Secondary | ICD-10-CM | POA: Diagnosis not present

## 2018-08-08 DIAGNOSIS — Z87891 Personal history of nicotine dependence: Secondary | ICD-10-CM | POA: Insufficient documentation

## 2018-08-08 DIAGNOSIS — R58 Hemorrhage, not elsewhere classified: Secondary | ICD-10-CM

## 2018-08-08 DIAGNOSIS — I10 Essential (primary) hypertension: Secondary | ICD-10-CM | POA: Insufficient documentation

## 2018-08-08 DIAGNOSIS — R600 Localized edema: Secondary | ICD-10-CM | POA: Diagnosis not present

## 2018-08-08 DIAGNOSIS — R7989 Other specified abnormal findings of blood chemistry: Secondary | ICD-10-CM | POA: Diagnosis not present

## 2018-08-08 LAB — CBC WITH DIFFERENTIAL/PLATELET
Abs Immature Granulocytes: 0.01 10*3/uL (ref 0.00–0.07)
Basophils Absolute: 0 10*3/uL (ref 0.0–0.1)
Basophils Relative: 0 %
Eosinophils Absolute: 0.1 10*3/uL (ref 0.0–0.5)
Eosinophils Relative: 1 %
HCT: 37.5 % (ref 36.0–46.0)
Hemoglobin: 11.9 g/dL — ABNORMAL LOW (ref 12.0–15.0)
Immature Granulocytes: 0 %
Lymphocytes Relative: 50 %
Lymphs Abs: 2.1 10*3/uL (ref 0.7–4.0)
MCH: 30.4 pg (ref 26.0–34.0)
MCHC: 31.7 g/dL (ref 30.0–36.0)
MCV: 95.9 fL (ref 80.0–100.0)
Monocytes Absolute: 0.3 10*3/uL (ref 0.1–1.0)
Monocytes Relative: 7 %
Neutro Abs: 1.9 10*3/uL (ref 1.7–7.7)
Neutrophils Relative %: 42 %
Platelets: 320 10*3/uL (ref 150–400)
RBC: 3.91 MIL/uL (ref 3.87–5.11)
RDW: 13.7 % (ref 11.5–15.5)
WBC: 4.4 10*3/uL (ref 4.0–10.5)
nRBC: 0 % (ref 0.0–0.2)

## 2018-08-08 LAB — BASIC METABOLIC PANEL
Anion gap: 5 (ref 5–15)
BUN: 9 mg/dL (ref 6–20)
CO2: 24 mmol/L (ref 22–32)
Calcium: 8.1 mg/dL — ABNORMAL LOW (ref 8.9–10.3)
Chloride: 109 mmol/L (ref 98–111)
Creatinine, Ser: 0.78 mg/dL (ref 0.44–1.00)
GFR calc Af Amer: >60 mL/min (ref >60)
GFR calc non Af Amer: >60 mL/min (ref >60)
Glucose, Bld: 90 mg/dL (ref 70–99)
Potassium: 3.7 mmol/L (ref 3.5–5.1)
Sodium: 138 mmol/L (ref 135–145)

## 2018-08-08 LAB — TROPONIN I (HIGH SENSITIVITY)
Troponin I (High Sensitivity): <2 ng/L (ref <18)
Troponin I (High Sensitivity): <2 ng/L (ref <18)

## 2018-08-08 LAB — D-DIMER, QUANTITATIVE: D-Dimer, Quant: 0.91 ug/mL-FEU — ABNORMAL HIGH (ref 0.00–0.50)

## 2018-08-08 MED ORDER — IOHEXOL 350 MG/ML SOLN
75.0000 mL | Freq: Once | INTRAVENOUS | Status: AC | PRN
  Administered 2018-08-08: 75 mL via INTRAVENOUS

## 2018-08-08 NOTE — ED Notes (Signed)
Patient transported to X-ray 

## 2018-08-08 NOTE — ED Provider Notes (Signed)
CT scan of the chest without any acute abnormalities no evidence of pulmonary embolus.  Doppler studies of the right leg not consistent with DVT.  Patient stable for discharge home no acute distress.  No significant leg pain no chest pain no shortness of breath.   Fredia Sorrow, MD 08/08/18 1101

## 2018-08-08 NOTE — Discharge Instructions (Signed)
CT scan of the chest showed no evidence of any blood clots.  Ultrasound study of the leg showed no blood clots there.  Follow-up with your doctor.  Return for any new or worse symptoms.
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
## 2018-08-08 NOTE — ED Provider Notes (Signed)
[facility] EMERGENCY DEPARTMENT Provider Note   CSN: [id number] Arrival date & time: 08/08/18  5625    History   Chief Complaint Chief Complaint  Patient presents with  . Leg Pain    HPI Brooke [name].     Patient presents for evaluation of pain and swelling of the right leg.  Patient reports that symptoms have been ongoing for a week or more.  She has noticed a bruise on her shin with pain around the area of the bruise as well as intermittent cramping of the calf.  She has had some cramping of the upper part of the leg as well when she walks.  She is unaware of any direct trauma, however, was recently seen in ER for breakthrough seizures and thinks the symptoms began around that time.  She has been noticing some intermittent tightness and cramping of the left chest over this period of time as well.     Past Medical History:  Diagnosis Date  . Acid reflux   . Anxiety   . Cervical radiculitis   . Chronic constipation   . DDD (degenerative disc disease), lumbar   . Depression   . Dizzy spells   . Facial numbness   . Heart palpitations 01/2017  . History of hiatal hernia   . Hypertension   . Insomnia   . Iron deficiency anemia   . Migraines    occ  . Near syncope 01/2017  . Numbness and tingling in left arm   . Obesity   . Panic attacks   . Sciatica   . Seizures [facility])     Patient Active Problem List   Diagnosis Date Noted  . Small bowel obstruction ([place]) 07/20/2017  . 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 Hypersomnia due to another medical condition 06/12/2017  . Spells of speech arrest 01/23/2017  . Intractable migraine with visual aura and without status migrainosus 01/23/2017  . Sinus congestion 01/23/2017  . Morbid obesity with body mass index (BMI) of 40.0 to 44.9 in adult [facility]) 01/23/2017  . Sleep deprivation 01/23/2017  . Word finding difficulty 01/23/2017  . Morbid obesity ([place]) 12/03/2012  . LUQ pain 11/19/2012   . Esophageal dysphagia 11/19/2012  . Other malaise and fatigue 05/19/2012  . Headache(784.0) 04/01/2012  . Amenorrhea 02/06/2012  . Depression 02/06/2012  . Panic attacks 02/06/2012  . Sinusitis 01/03/2012  . ANEMIA, IRON DEFICIENCY 05/05/2009  . NICOTINE ADDICTION 03/04/2009  . GERD 03/04/2009  . CONSTIPATION, CHRONIC 03/04/2009    Past Surgical History:  Procedure Laterality Date  . BUNIONECTOMY Left yrs ago  . CERVICAL ABLATION  2017  . COLONOSCOPY, ESOPHAGOGASTRODUODENOSCOPY (EGD) AND ESOPHAGEAL DILATION N/A 12/03/2012   WLS:LHTDSKAJ melanosis throughout the entire examined colon/The colon IS redundant/Small internal hemorrhoids/EGD:Esophageal web/Medium sized hiatal hernia/MILD Non-erosive gastritis  . ESOPHAGOGASTRODUODENOSCOPY  03/09/09   Dr. Wilford Corner, normal EGD, s/p Bravo capsule placement  . GASTRIC ROUX-EN-Y N/A 07/16/2017   Procedure: LAPAROSCOPIC ROUX-EN-Y GASTRIC BYPASS WITH UPPER ENDOSCOPY AND ERAS PATHWAY;  Surgeon: Johnathan Hausen, MD;  Location: [facility] ORS;  Service: General;  Laterality: N/A;  . LAPAROSCOPY N/A 07/20/2017   Procedure: LAPAROSCOPY DIAGNOSTIC. REDUCTION OF SMALL BOWEL OBSTRUCTION. REPAIR OF TROCAR HERNIA.;  Surgeon: Alphonsa Overall, MD;  Location: [facility] ORS;  Service: General;  Laterality: N/A;  . TUBAL LIGATION    . WISDOM TOOTH EXTRACTION       OB History   No obstetric history on file.  Home Medications    Prior to Admission medications   Medication Sig Start Date End Date Taking? Authorizing Provider  acetaminophen (TYLENOL) 500 MG tablet Take 1,000 mg by mouth daily as needed for moderate pain or headache.    [provider]  Armodafinil 200 MG TABS TAKE 1 TABLET BY MOUTH EVERY MORNING Patient taking differently: Take 200 mg by mouth every morning.  02/05/18   Dohmeier, Asencion Partridge, MD  Biotin 10000 MCG TABS Take 10,000 mcg by mouth daily.    [provider]  butalbital-aspirin-caffeine-codeine (FIORINAL WITH CODEINE)  50-325-40-30 MG capsule TAKE 1 CAPSULE BY MOUTH WITH LOTS OF WATER.MAY REPEAT ONCE IN 3 HOURS. DO NOT EXCEED TWICE DAILY Patient taking differently: Take 1 capsule by mouth daily as needed for headache or migraine. Drink with lots of water but not to exceed more than twice daily use 05/12/18   Dohmeier, Asencion Partridge, MD  CALCIUM PO Take 1 capsule by mouth daily.    [provider]  Cholecalciferol (VITAMIN D) 2000 units CAPS Take 2,000 Units by mouth daily.    [provider]  ferrous sulfate 325 (65 FE) MG EC tablet Take 325 mg by mouth daily with breakfast.    [provider]  gabapentin (NEURONTIN) 100 MG capsule Take 100 mg by mouth 3 (three) times daily as needed (for pain).     [provider]  levETIRAcetam (KEPPRA XR) 500 MG 24 hr tablet Take 1 tablet (500 mg total) by mouth daily. Patient taking differently: Take 500 mg by mouth 2 (two) times a day.  07/08/18   Dohmeier, Asencion Partridge, MD  pantoprazole (PROTONIX) 40 MG tablet Take 40 mg by mouth daily.    [provider]  PREMPRO 0.45-1.5 MG tablet Take 1 tablet by mouth daily.  06/25/18   [provider]  sertraline (ZOLOFT) 25 MG tablet Take 1 tablet (25 mg total) by mouth daily. 07/08/18   Dohmeier, Asencion Partridge, MD  topiramate (TOPAMAX) 100 MG tablet Take 100 mg by mouth daily.    [provider]    Family History Family History  Problem Relation Age of Onset  . Diabetes Mother   . Hypertension Mother   . Drug abuse Mother   . Anxiety disorder Mother   . Depression Mother   . Hypertension Father   . Diabetes Sister   . Hypertension Sister   . Hypertension Brother   . Drug abuse Brother   . Diabetes Paternal Grandmother   . Hypertension Brother   . Drug abuse Brother   . Hypertension Brother   . Drug abuse Brother   . Cancer Maternal Grandmother        breast  . Anxiety disorder Maternal Grandmother   . Depression Maternal Grandmother   . Asthma Other   . Heart disease Other   .  Colon cancer Neg Hx     Social History Social History   Tobacco Use  . Smoking status: Former Smoker    Packs/day: 0.30    Years: 23.00    Pack years: 6.90    Types: Cigarettes    Quit date: 10/04/2012    Years since quitting: 5.8  . Smokeless tobacco: Never Used  . Tobacco comment: less than 1/2 pack cigarettes daily  Substance Use Topics  . Alcohol use: Yes    Comment: once every 2-3 months  . Drug use: No     Allergies   Patient has no known allergies.   Review of Systems Review of Systems  Cardiovascular:  Positive for chest pain.  Skin: Positive for color change.  All other systems reviewed and are negative.    Physical Exam Updated Vital Signs BP 130/82 (BP Location: Left Arm)   Pulse 61   Temp 98.1 F (36.7 C) (Oral)   Resp 18   Ht 5\' 2"  (1.575 m)   Wt 86.2 kg   LMP 07/05/2018   SpO2 100%   BMI 34.75 kg/m   Physical Exam Vitals signs and nursing note reviewed.  Constitutional:      General: She is not in acute distress.    Appearance: Normal appearance. She is well-developed.  HENT:     Head: Normocephalic and atraumatic.     Right Ear: Hearing normal.     Left Ear: Hearing normal.     Nose: Nose normal.  Eyes:     Conjunctiva/sclera: Conjunctivae normal.     Pupils: Pupils are equal, round, and reactive to light.  Neck:     Musculoskeletal: Normal range of motion and neck supple.  Cardiovascular:     Rate and Rhythm: Regular rhythm.     Heart sounds: S1 normal and S2 normal. No murmur. No friction rub. No gallop.   Pulmonary:     Effort: Pulmonary effort is normal. No respiratory distress.     Breath sounds: Normal breath sounds.  Chest:     Chest wall: No tenderness.  Abdominal:     General: Bowel sounds are normal.     Palpations: Abdomen is soft.     Tenderness: There is no abdominal tenderness. There is no guarding or rebound. Negative signs include Murphy's sign and McBurney's sign.     Hernia: No hernia is present.   Musculoskeletal: Normal range of motion.     Left lower leg: She exhibits tenderness.     Comments: Tenderness on anterior aspect of left lower leg around the area of bruising.  Diffuse soft tissue tenderness without obvious increased circumference of calf.  No venous cords.  No overlying erythema or warmth.  Skin:    General: Skin is warm and dry.     Findings: Bruising (Left shin) present. No rash.  Neurological:     Mental Status: She is alert and oriented to person, place, and time.     GCS: GCS eye subscore is 4. GCS verbal subscore is 5. GCS motor subscore is 6.     Cranial Nerves: No cranial nerve deficit.     Sensory: No sensory deficit.     Coordination: Coordination normal.  Psychiatric:        Speech: Speech normal.        Behavior: Behavior normal.        Thought Content: Thought content normal.      ED Treatments / Results  Labs (all labs ordered are listed, but only abnormal results are displayed) Labs Reviewed  CBC WITH DIFFERENTIAL/PLATELET  BASIC METABOLIC PANEL  D-DIMER, QUANTITATIVE (NOT AT [facility])  TROPONIN I (HIGH SENSITIVITY)    EKG None  Radiology Dg Chest 2 View  Result Date: 08/08/2018 CLINICAL DATA:  Chest pain EXAM: CHEST - 2 VIEW COMPARISON:  01/05/2017 FINDINGS: Normal heart size and mediastinal contours. No acute infiltrate or edema. No effusion (stable appearance of posterior costophrenic sulci) or pneumothorax. No acute osseous findings. IMPRESSION: No active cardiopulmonary disease. Electronically Signed   By: Monte Fantasia M.D.   On: 08/08/2018 06:03    Procedures Procedures (including critical care time)  Medications Ordered in ED Medications - No data to  display   Initial Impression / Assessment and Plan / ED Course  I have reviewed the triage vital signs and the nursing notes.  Pertinent labs & imaging results that were available during my care of the patient were reviewed by me and considered in my medical decision making (see  chart for details).      Patient presents with complaints of leg pain and swelling.  She does have some bruising on her anterior shin in the area where she is having the most pain.  She does have a seizure disorder, this was possibly a simple soft tissue injury.  She also, however, describes some intermittent episodes of chest discomfort which she describes as a tightness.  We will therefore need to evaluate from a cardiac standpoint.  Additionally will obtain d-dimer.  If d-dimer does not rule out venous thromboembolism, will require further work-up including ultrasound of leg and/or CT angiography of the chest.  Will sign out to oncoming ER physician to follow-up on results.    Final Clinical Impressions(s) / ED Diagnoses   Final diagnoses:  Right leg pain  Ecchymosis    ED Discharge Orders    None       Orpah Greek, MD 08/08/18 [phone]

## 2018-08-18 ENCOUNTER — Ambulatory Visit (INDEPENDENT_AMBULATORY_CARE_PROVIDER_SITE_OTHER): Payer: BC Managed Care – PPO | Admitting: Neurology

## 2018-08-18 ENCOUNTER — Other Ambulatory Visit: Payer: Self-pay

## 2018-08-18 DIAGNOSIS — R404 Transient alteration of awareness: Secondary | ICD-10-CM

## 2018-08-18 DIAGNOSIS — F5104 Psychophysiologic insomnia: Secondary | ICD-10-CM

## 2018-08-18 DIAGNOSIS — G43119 Migraine with aura, intractable, without status migrainosus: Secondary | ICD-10-CM

## 2018-08-18 DIAGNOSIS — F422 Mixed obsessional thoughts and acts: Secondary | ICD-10-CM

## 2018-08-20 ENCOUNTER — Other Ambulatory Visit: Payer: Self-pay

## 2018-08-20 ENCOUNTER — Ambulatory Visit (INDEPENDENT_AMBULATORY_CARE_PROVIDER_SITE_OTHER): Payer: BC Managed Care – PPO | Admitting: Psychiatry

## 2018-08-20 DIAGNOSIS — F330 Major depressive disorder, recurrent, mild: Secondary | ICD-10-CM | POA: Diagnosis not present
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
## 2018-08-20 NOTE — Progress Notes (Signed)
Virtual Visit via Video Note  I connected with Brooke [name] on 08/27/18 at  8:40 AM EDT by a video enabled telemedicine application and verified that I am speaking with the correct person using two identifiers.   I discussed the limitations of evaluation and management by telemedicine and the availability of in person appointments. The patient expressed understanding and agreed to proceed.   I discussed the assessment and treatment plan with the patient. The patient was provided an opportunity to ask questions and all were answered. The patient agreed with the plan and demonstrated an understanding of the instructions.   The patient was advised to call back or seek an in-person evaluation if the symptoms worsen or if the condition fails to improve as anticipated.  I provided 15 minutes of non-face-to-face time during this encounter.   [name], MD    [facility] MD/PA/NP OP Progress Note  08/27/2018 9:11 AM Brooke [name]  MRN:  [id number]  Chief Complaint:  Chief Complaint    Depression; Follow-up     HPI:  - She had worsening in panic attacks, which coincided with uptitration of sertraline.  This is a follow-up appointment for depression.  She states that her stress level is very high.  She has been feeling "agitated." She tends to get overwhelmed with her children. Her 37 year old cousin/son lives with the patient is doing home school. She also takes care of her grandchild during the day. She feels frustrated that she cannot drive by herself and needs to be "here all day." She wishes to do things by herself and has time for herself. She had a couple of seizures since the last visit.  Although she enjoys taking care of plant, she has mild anhedonia. She has fair sleep. She feels down. She denies SI. She has occasional panic attacks. She has fair concentration. She has been taking sertraline 25 mg twice a day.   Visit Diagnosis:    ICD-10-CM   1. MDD (major depressive disorder),  recurrent episode, mild ([place])  F33.0     Past Psychiatric History: Please see initial evaluation for full details. I have reviewed the history. No updates at this time.     Past Medical History:  Past Medical History:  Diagnosis Date  . Acid reflux   . Anxiety   . Cervical radiculitis   . Chronic constipation   . DDD (degenerative disc disease), lumbar   . Depression   . Dizzy spells   . Facial numbness   . Heart palpitations 01/2017  . History of hiatal hernia   . Hypertension   . Insomnia   . Iron deficiency anemia   . Migraines    occ  . Near syncope 01/2017  . Numbness and tingling in left arm   . Obesity   . Panic attacks   . Sciatica   . Seizures ([place])     Past Surgical History:  Procedure Laterality Date  . BUNIONECTOMY Left yrs ago  . CERVICAL ABLATION  2017  . COLONOSCOPY, ESOPHAGOGASTRODUODENOSCOPY (EGD) AND ESOPHAGEAL DILATION N/A 12/03/2012   LPF:[id number] melanosis throughout the entire examined colon/The colon IS redundant/Small internal hemorrhoids/EGD:Esophageal web/Medium sized hiatal hernia/MILD Non-erosive gastritis  . ESOPHAGOGASTRODUODENOSCOPY  03/09/09   Dr. Wilford Corner, normal EGD, s/p Bravo capsule placement  . GASTRIC ROUX-EN-Y N/A 07/16/2017   Procedure: LAPAROSCOPIC ROUX-EN-Y GASTRIC BYPASS WITH UPPER ENDOSCOPY AND ERAS PATHWAY;  Surgeon: Johnathan Hausen, MD;  Location: [facility];  Service: General;  Laterality: N/A;  . LAPAROSCOPY  N/A 07/20/2017   Procedure: LAPAROSCOPY DIAGNOSTIC. REDUCTION OF SMALL BOWEL OBSTRUCTION. REPAIR OF TROCAR HERNIA.;  Surgeon: Alphonsa Overall, MD;  Location: [facility];  Service: General;  Laterality: N/A;  . TUBAL LIGATION    . WISDOM TOOTH EXTRACTION      Family Psychiatric History: Please see initial evaluation for full details. I have reviewed the history. No updates at this time.     Family History:  Family History  Problem Relation Age of Onset  . Diabetes Mother   . Hypertension Mother   . Drug abuse Mother    . Anxiety disorder Mother   . Depression Mother   . Hypertension Father   . Diabetes Sister   . Hypertension Sister   . Hypertension Brother   . Drug abuse Brother   . Diabetes Paternal Grandmother   . Hypertension Brother   . Drug abuse Brother   . Hypertension Brother   . Drug abuse Brother   . Cancer Maternal Grandmother        breast  . Anxiety disorder Maternal Grandmother   . Depression Maternal Grandmother   . Asthma Other   . Heart disease Other   . Colon cancer Neg Hx     Social History:  Social History   Socioeconomic History  . Marital status: Married    Spouse name: Not on file  . Number of children: 2  . Years of education: Not on file  . Highest education level: Not on file  Occupational History  . Occupation: [employer]    Employer: [employer]  . Financial resource strain: Not on file  . Food insecurity    Worry: Not on file    Inability: Not on file  . Transportation needs    Medical: Not on file    Non-medical: Not on file  Tobacco Use  . Smoking status: Former Smoker    Packs/day: 0.30    Years: 23.00    Pack years: 6.90    Types: Cigarettes    Quit date: 10/04/2012    Years since quitting: 5.8  . Smokeless tobacco: Never Used  . Tobacco comment: less than 1/2 pack cigarettes daily  Substance and Sexual Activity  . Alcohol use: Yes    Comment: once every 2-3 months  . Drug use: No  . Sexual activity: Yes    Partners: Male    Birth control/protection: Surgical  Lifestyle  . Physical activity    Days per week: Not on file    Minutes per session: Not on file  . Stress: Not on file  Relationships  . Social Herbalist on phone: Not on file    Gets together: Not on file    Attends religious service: Not on file    Active member of club or organization: Not on file    Attends meetings of clubs or organizations: Not on file    Relationship status: Not on file  Other Topics Concern  .  Not on file  Social History Narrative   Lives with husband and daughter in a one story home      Right handed      Highest level of edu- some college    Allergies: No Known Allergies  Metabolic Disorder Labs: No results found for: HGBA1C, MPG No results found for: PROLACTIN Lab Results  Component Value Date   CHOL 184 05/13/2012   TRIG 67 05/13/2012   HDL 47 05/13/2012   CHOLHDL  3.9 05/13/2012   VLDL 13 05/13/2012   LDLCALC 124 (H) 05/13/2012   Lab Results  Component Value Date   TSH 0.823 05/13/2012    Therapeutic Level Labs: No results found for: LITHIUM No results found for: VALPROATE No components found for:  CBMZ  Current Medications: Current Outpatient Medications  Medication Sig Dispense Refill  . acetaminophen (TYLENOL) 500 MG tablet Take 1,000 mg by mouth daily as needed for moderate pain or headache.    . Biotin 10000 MCG TABS Take 10,000 mcg by mouth daily.    . butalbital-aspirin-caffeine-codeine (FIORINAL WITH CODEINE) 50-325-40-30 MG capsule TAKE 1 CAPSULE BY MOUTH WITH LOTS OF WATER.MAY REPEAT ONCE IN 3 HOURS. DO NOT EXCEED TWICE DAILY (Patient taking differently: Take 1 capsule by mouth daily as needed for headache or migraine. Drink with lots of water but not to exceed more than twice daily use) 30 capsule 0  . CALCIUM PO Take 1 capsule by mouth daily.    . Cholecalciferol (VITAMIN D) 2000 units CAPS Take 2,000 Units by mouth daily.    . ferrous sulfate 325 (65 FE) MG EC tablet Take 325 mg by mouth daily with breakfast.    . gabapentin (NEURONTIN) 100 MG capsule Take 100 mg by mouth 3 (three) times daily as needed (for pain).     Marland Kitchen levETIRAcetam (KEPPRA) 250 MG tablet Take 1 tablet by mouth 3 (three) times daily.    . meclizine (ANTIVERT) 25 MG tablet Take 1 tablet by mouth 3 (three) times daily.    . pantoprazole (PROTONIX) 40 MG tablet Take 40 mg by mouth daily.    Marland Kitchen PREMPRO 0.45-1.5 MG tablet Take 1 tablet by mouth daily.     . sertraline (ZOLOFT)  100 MG tablet Take 1 tablet (100 mg total) by mouth at bedtime. 30 tablet 1  . sertraline (ZOLOFT) 25 MG tablet Take 1 tablet (25 mg total) by mouth daily. 30 tablet 5  . topiramate (TOPAMAX) 100 MG tablet Take 100 mg by mouth daily.     No current facility-administered medications for this visit.      Musculoskeletal: Strength & Muscle Tone: N/A Gait & Station: N/A Patient leans: N/A  Psychiatric Specialty Exam: Review of Systems  Psychiatric/Behavioral: Positive for depression. Negative for hallucinations, memory loss, substance abuse and suicidal ideas. The patient is nervous/anxious. The patient does not have insomnia.   All other systems reviewed and are negative.   There were no vitals taken for this visit.There is no height or weight on file to calculate BMI.  General Appearance: Fairly Groomed  Eye Contact:  Good  Speech:  Clear and Coherent  Volume:  Normal  Mood:  Anxious  Affect:  Appropriate, Congruent and slightly tense  Thought Process:  Coherent  Orientation:  Full (Time, Place, and Person)  Thought Content: Logical   Suicidal Thoughts:  No  Homicidal Thoughts:  No  Memory:  Immediate;   Good  Judgement:  Good  Insight:  Good  Psychomotor Activity:  Normal  Concentration:  Concentration: Good and Attention Span: Good  Recall:  Good  Fund of Knowledge: Good  Language: Good  Akathisia:  No  Handed:  Right  AIMS (if indicated): not done  Assets:  Communication Skills Desire for Improvement  ADL's:  Intact  Cognition: WNL  Sleep:  Poor   Screenings: PHQ2-9     Nutrition from 05/29/2017 in [facility]  PHQ-2 Total Score  0       Assessment and  Plan:  Brooke [name] is a 48 y.o. year old female with a history of depression,  spells of unresponsiveness, followed by neurology, migraine, hypertension, GERD, s/p RYGB 07/2017, mild obstructive sleep apnea, who presents for follow up appointment for depression.   # MDD, mild,  single without psychotic features # OCD She reports ongoing anxiety despite up titration of sertraline.  Psychosocial stressors includes taking care of her son for home school, unemployment, and she is demoralized due to pain and seizure.  Will do further up titration of sertraline to target residual mood symptoms. She is advised to contact the office if any worsening in her mood symptoms after this uptitration. Discussed behavioral activation.  She is encouraged to continue to see a therapist.    Plan 1. Increase sertraline 100 mg daily (according to the patient, neurologist advised her to take med as BID; she is advised to take 50 mg BID if that is preferred) 2. Next appointment: 10/7 at 8:40 for 20 mins, video  The patient demonstrates the following risk factors for suicide: Chronic risk factors for suicide include: psychiatric disorder of depression, OCD and chronic pain. Acute risk factors for suicide include: unemployment. Protective factors for this patient include: positive social support, responsibility to others (children, family), coping skills and hope for the future. Considering these factors, the overall suicide risk at this point appears to be low. Patient is appropriate for outpatient follow up.   [name], MD 08/27/2018, 9:11 AM

## 2018-08-27 ENCOUNTER — Ambulatory Visit (INDEPENDENT_AMBULATORY_CARE_PROVIDER_SITE_OTHER): Payer: BC Managed Care – PPO | Admitting: Psychiatry

## 2018-08-27 ENCOUNTER — Other Ambulatory Visit: Payer: Self-pay

## 2018-08-27 ENCOUNTER — Encounter (HOSPITAL_COMMUNITY): Payer: Self-pay | Admitting: Psychiatry

## 2018-08-27 DIAGNOSIS — F320 Major depressive disorder, single episode, mild: Secondary | ICD-10-CM | POA: Diagnosis not present

## 2018-08-27 DIAGNOSIS — F429 Obsessive-compulsive disorder, unspecified: Secondary | ICD-10-CM | POA: Diagnosis not present

## 2018-08-27 DIAGNOSIS — F330 Major depressive disorder, recurrent, mild: Secondary | ICD-10-CM

## 2018-08-27 MED ORDER — SERTRALINE HCL 100 MG PO TABS: 100.0000 mg | ORAL_TABLET | Freq: Every day | ORAL | 1 refills | Status: DC

## 2018-08-27 NOTE — Patient Instructions (Addendum)
1. Increase sertraline 100 mg daily  2. Next appointment: 10/7 at 8:40

## 2018-09-01 ENCOUNTER — Encounter: Payer: Self-pay | Admitting: Neurology

## 2018-09-01 ENCOUNTER — Other Ambulatory Visit: Payer: Self-pay

## 2018-09-01 ENCOUNTER — Ambulatory Visit (INDEPENDENT_AMBULATORY_CARE_PROVIDER_SITE_OTHER): Payer: BC Managed Care – PPO | Admitting: Neurology

## 2018-09-01 VITALS — BP 110/72 | HR 84 | Ht 62.0 in | Wt 195.1 lb

## 2018-09-01 DIAGNOSIS — F445 Conversion disorder with seizures or convulsions: Secondary | ICD-10-CM | POA: Diagnosis not present

## 2018-09-01 DIAGNOSIS — R079 Chest pain, unspecified: Secondary | ICD-10-CM

## 2018-09-01 NOTE — Patient Instructions (Signed)
1. Refer to Cardiology for chest pain and dizziness  2. Continue follow-up with Psychiatry to help with anxiety and depression  3. Reduce Keppra to 500mg  twice a day. If symptoms start feeling better with seeing the cardiologist and psychiatrist, can potentially get off the [place] later on  4. Follow-up with Dr. Brett Fairy as scheduled, call for any questions
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
## 2018-09-03 ENCOUNTER — Other Ambulatory Visit: Payer: Self-pay

## 2018-09-03 ENCOUNTER — Ambulatory Visit (HOSPITAL_COMMUNITY): Payer: BC Managed Care – PPO | Admitting: Psychiatry

## 2018-09-09 ENCOUNTER — Telehealth: Payer: Self-pay | Admitting: Neurology

## 2018-09-09 NOTE — Telephone Encounter (Signed)
Pt states that we decreased her Keppra and she has felt bad since then and she has had 3 seizures on Saturday and 3 on Sunday they lasted about 30 mins  Please call

## 2018-09-09 NOTE — Telephone Encounter (Signed)
Patient wants to move forward with the over night EEG at [facility]

## 2018-09-09 NOTE — Telephone Encounter (Signed)
Patient also sent message on MyChart, replied to message via [place].

## 2018-09-10 NOTE — Telephone Encounter (Signed)
Dr. Delice Lesch,  Do I need to put in a referral?  I seen where the pt has sent a multitude of mychart messages.

## 2018-09-12 ENCOUNTER — Telehealth: Payer: Self-pay | Admitting: Neurology

## 2018-09-12 NOTE — Telephone Encounter (Signed)
Pls see what [facility] wait time is for EMU. If it is after Oct 12, then at that time we can potentially be providing this service at [facility] and can just do study here. Thanks

## 2018-09-12 NOTE — Telephone Encounter (Signed)
Left message for pt on Mychart. Does she want to go to [facility] or go to [facility] for the EEG Video Monitoring?

## 2018-09-12 NOTE — Telephone Encounter (Signed)
Lovey Newcomer will be out of the office until the 21st  Spoke with Mateo Flow. EMU's are scheduling at the end of October  Need to fax pt info to [phone] since Lovey Newcomer is out.  If Ambulatory EEG make sure to specify how many days the pt needs to be monitored. 24,48 or 72

## 2018-09-12 NOTE — Telephone Encounter (Signed)
Pt would like to go where she can be seen the quickest.   I will call [facility] to see if they can get her scheduled first.

## 2018-09-12 NOTE — Telephone Encounter (Signed)
Patient wants to check the status of the referral to [facility] please call she left a message on the VM

## 2018-09-15 NOTE — Telephone Encounter (Signed)
Pt would like to go wherever she can be seen first.  Brooke [name] is scheduling at the end of Oct. Per Dr. Marlaine Hind is scheduling Oct 12.  I have left a message with [facility] for Lala Lund to call back. # [phone]

## 2018-09-15 NOTE — Telephone Encounter (Signed)
Brooke [name] returned call. Said to give her two days. She needs to confirm start dates for the [facility]. Pt made aware via Mychart.

## 2018-09-17 ENCOUNTER — Ambulatory Visit (HOSPITAL_COMMUNITY): Payer: BC Managed Care – PPO | Admitting: Psychiatry

## 2018-09-17 ENCOUNTER — Telehealth (HOSPITAL_COMMUNITY): Payer: Self-pay | Admitting: Psychiatry

## 2018-09-17 ENCOUNTER — Other Ambulatory Visit: Payer: Self-pay

## 2018-09-17 NOTE — Telephone Encounter (Signed)
Therapist attempted to contact patient twice via [email or web address] platform for scheduled appointment, no response. Therapist called patient and left message indicating attempt and requesting patient call office.

## 2018-09-18 NOTE — Telephone Encounter (Signed)
Spoke with Levada Dy. Having a meeting tomorrow am. She will call tomorrow.

## 2018-09-19 ENCOUNTER — Other Ambulatory Visit: Payer: Self-pay

## 2018-09-19 DIAGNOSIS — R404 Transient alteration of awareness: Secondary | ICD-10-CM

## 2018-09-19 DIAGNOSIS — F5104 Psychophysiologic insomnia: Secondary | ICD-10-CM

## 2018-09-19 DIAGNOSIS — F445 Conversion disorder with seizures or convulsions: Secondary | ICD-10-CM

## 2018-09-19 NOTE — Telephone Encounter (Signed)
Anglea called while I was at lunch. There is good news apparently for the EMU schedule with [facility].  Left her a voicemail with pts MRN in case she can schedule now and to call me back if needed.

## 2018-09-24 ENCOUNTER — Telehealth: Payer: Self-pay | Admitting: *Deleted

## 2018-09-24 ENCOUNTER — Telehealth (HOSPITAL_COMMUNITY): Payer: Self-pay

## 2018-09-24 NOTE — Telephone Encounter (Signed)
Called and spoke to Mora with [employer] auth dept to start prior auth for EMU (Epilepsy Monitoring Unit).  Per Vito Backers seems that customer service was called and not prior auth dept due to having a call reference number. I was able to give her CPT codes 95720 & [phone] and diagnosis code listed on order. Cassandra states that referring office needs to send any clinical notes, prior EEG test, order for EUM and any other information pertaining to this for EMU approval. Please list pending reference number on documents [id number] and fax to [facility] prior approval dept at [phone].

## 2018-09-24 NOTE — Telephone Encounter (Signed)
Waldo Laine from [facility] EMU monitoring unit called to ask [place] to call patients insurance and start the PA for the patient to be admitted for epilepsy monitoring in the new unit at [facility]. Angela's direct number is [phone].  She asked me to call [facility] provider services at [phone] to start PA for this testing.   Dr. Delice Lesch is referring to Dr. Zeb Comfort MD to do this testing. NPI [id number].  Spoke to [employer]. At [facility]. He stated that referrals for BCBS are not needed - based on medical necessity. He checked and there is no authorization on file yet for this procedure.  Darrick Meigs stated he needed codes for testing / procedure to be done. I was not sure so he will call Levada Dy at the [facility] unit ([phone]) and get the code numbers. Told [employer] if they have any other specific questions about the patient to please call me back.   He called Levada Dy but she is not at desk and he stated that I can have her call him back. I called and left a message on Angela's answering machine to please call [employer] and gave her the below reference number for the call:   The reference number for this call is [id number]

## 2018-09-25 ENCOUNTER — Other Ambulatory Visit: Payer: Self-pay

## 2018-09-25 ENCOUNTER — Ambulatory Visit (HOSPITAL_COMMUNITY): Payer: BC Managed Care – PPO | Admitting: Psychiatry

## 2018-09-25 ENCOUNTER — Telehealth (HOSPITAL_COMMUNITY): Payer: Self-pay | Admitting: Psychiatry

## 2018-09-25 NOTE — Procedures (Signed)
ELECTROENCEPHALOGRAM REPORT  Dates of Recording: 08/18/2018 9:59AM to 08/21/2018 9:58AM  Patient's Name: Brooke [name] MRN: [id number] Date of Birth: Mar 12, 1970  Referring Provider: Dr. Ellouise Newer  Procedure: 72-hour ambulatory video EEG  History: This is a 48 year old woman with recurrent episodes of feeling nauseated, chest tightness, unresponsiveness. EEG for classification  Medications:  NEURONTIN 100 MG capsule KEPPRA XR 500 MG 24 hr tablet TOPAMAX 100 MG tablet TYLENOL 500 MG tablet Armodafinil 200 MG TABS Biotin 10000 MCG TABS FIORINAL WITH CODEINE 50-325-40-30 MG capsule CALCIUM PO VITAMIN D 2000 units CAPS 65 FE MG EC tablet PROTONIX 40 MG tablet PREMPRO 0.45-1.5 MG tablet ZOLOFT 25 MG tablet   Technical Summary: This is a 72-hour multichannel digital video EEG recording measured by the international 10-20 system with electrodes applied with paste and impedances below 5000 ohms performed as portable with EKG monitoring.  The digital EEG was referentially recorded, reformatted, and digitally filtered in a variety of bipolar and referential montages for optimal display.    DESCRIPTION OF RECORDING: During maximal wakefulness, the background activity consisted of a symmetric 10 Hz posterior dominant rhythm which was reactive to eye opening.  There were no epileptiform discharges or focal slowing seen in wakefulness.  During the recording, the patient progresses through wakefulness, drowsiness, and Stage 2 sleep.  Again, there were no epileptiform discharges seen.  Events: On 8/17 at between 1045 to 1106 hours, she reports chest tightness, headache, left arm aching, dizziness. Electrographically, there were no EEG or EKG changes seen.  On 8/17 at [id number] hours, she reports nausea and feeling lightheaded. Electrographically, there were no EEG or EKG changes seen.  On 8/17 at 1219 hours, she reports heart pounding. Electrographically, there were no EEG or EKG changes  seen.  On 8/17 at 1225 hours, she reports staring, then chest pain, left arm hurting, dizziness, nausea.  She is seen sitting on recliner looking at phone with left hand, holding sleeping child. Electrographically, there were no EEG or EKG changes seen.  On 8/17 at 1244 hours, she reports body feels numb, heart fluttering. Electrographically, there were no EEG or EKG changes seen.  On 8/17 at 1338 hours, she has chest pain, shortness of breath. Electrographically, there were no EEG or EKG changes seen.  On 8/17 at 1440 hours, she feels dizzy, nausea, chest pain. Electrographically, there were no EEG or EKG changes seen.  On 8/17 at 1509 hours and 1530 hours, she has chest pain/tightness, headache, dizziness. Electrographically, there were no EEG or EKG changes seen.  On 8/17 at 1649 hours, 1748 hours, she has chest tightness, dizziness. Electrographically, there were no EEG or EKG changes seen.  On 8/17 at 1945 hours, she feels dizzy. Electrographically, there were no EEG or EKG changes seen.  On 8/17 at 2124 hours, she has chest tightness, heart pounding, nausea, chest tightness, dizziness. Electrographically, there were no EEG or EKG changes seen.  On 8/18 at 0615 hours, she wakes up dizzy. Electrographically, there were no EEG or EKG changes seen.  On 8/18 at 0707 hours, she has chest tightness, heart pounding, headache. Electrographically, there were no EEG or EKG changes seen.  On 8/18 at 0848 hours, she has a headache, nausea, dizziness, left arm tingling. Electrographically, there were no EEG or EKG changes seen.  On 8/18 at 0903 hours, she has chest pain. Electrographically, there were no EEG or EKG changes seen.  On 8/18 at 0956 hours, she has a heart flutter. Electrographically, there were no EEG or  EKG changes seen.  On 8/18 at 1208 hours, she has chest tightness, nausea, no energy. Electrographically, there were no EEG or EKG changes seen.  On 8/18 at 1812 hours, she has  chest tightness, headache. Electrographically, there were no EEG or EKG changes seen.  On 8/18 at 2110 and 2134 hours, she has chest tightness, shortness of breath, heart pounding. Electrographically, there were no EEG or EKG changes seen.  On 8/18 at 2209 hours, she has chest tightness, left arm tingling. Electrographically, there were no EEG or EKG changes seen.  On 8/19 at 0640 hours, she wakes up dizzy, chest tight. Electrographically, there were no EEG or EKG changes seen.  On 8/19 at 1000 hours, she has chest tightness. Electrographically, there were no EEG or EKG changes seen.  On 8/19 at 1242 hours, she feels bad, heart pounding, very nauseated. Electrographically, there were no EEG or EKG changes seen.  On 8/20 at 0852 and 0909 hours, she feels dizzy, heart pounding. Electrographically, there were no EEG or EKG changes seen.    There were no electrographic seizures seen.  EKG lead was unremarkable.  IMPRESSION: This 72-hour ambulatory video EEG study is normal.    CLINICAL CORRELATION: Numerous episodes of chest tightness, nausea, dizziness, left arm tingling did not show electrographic correlate. She reported one episode of staring with no EEG change. Episodes captured were non-epileptic. If further clinical questions remain, inpatient video EEG monitoring may be helpful.   Ellouise Newer, M.D.

## 2018-09-25 NOTE — Telephone Encounter (Signed)
Faxed OV notes and Ambulatory EEG to [facility].  Pt has an appt with Cardio on 10/01/18

## 2018-09-25 NOTE — Telephone Encounter (Signed)
Pls send notes, Ambulatory EEG report to [facility]. Also, pls call patient for update and ask if she has seen the cardiologist as well since she is having a lot of the chest tightness. Thanks

## 2018-09-25 NOTE — Telephone Encounter (Signed)
Therapist attempted to contact patient for scheduled appointment,  left message indicating attempt, and requested patient call office.

## 2018-09-30 NOTE — Progress Notes (Signed)
Virtual Visit via Video Note  I connected with Brooke [name] on 10/08/18 at  8:40 AM EDT by a video enabled telemedicine application and verified that I am speaking with the correct person using two identifiers.   I discussed the limitations of evaluation and management by telemedicine and the availability of in person appointments. The patient expressed understanding and agreed to proceed.     I discussed the assessment and treatment plan with the patient. The patient was provided an opportunity to ask questions and all were answered. The patient agreed with the plan and demonstrated an understanding of the instructions.   The patient was advised to call back or seek an in-person evaluation if the symptoms worsen or if the condition fails to improve as anticipated.  I provided 15 minutes of non-face-to-face time during this encounter.   Brooke Clay, MD    [facility] MD/PA/NP OP Progress Note  10/08/2018 8:59 AM COLLIER YSAGUIRRE  MRN:  [id number]  Chief Complaint:  Chief Complaint    Depression; Follow-up     HPI:  This is a follow-up appointment for depression and OCD.  She states that she believes her anxiety has been a little better, although she continues to feel irritated and frustrated.  She tends to get irritated when her husband talks with the patient, although she reports good relationship with him overall. She is concerned that she has an upcoming appointment with neurologist and she needs to take off her seizure medication. She had two seizures since the last visit.  She feels stressed that she needs to do home school for her children.  She has been doing babysit her grandchild. She is worried about her husband, who was found to have some "blockage" in brain after stroke. She is concerned about her condition of unemployment.  She middle insomnia.  She tends to think about things she needs to do. She has fair motivation and energy. She enjoyed going to football game the other day.  She has good appetite. She had passive SI when she felt frustrated, although she adamantly denies any plan or intent. She feels anxious, tense. She had a few panic attacks. She tends to vacuum, mop floors a few times per day as she "don' like dirty floors." She denies counting or other compulsive behaviors.   Visit Diagnosis:    ICD-10-CM   1. MDD (major depressive disorder), recurrent episode, mild ([place])  F33.0   2. Mixed obsessional thoughts and acts  F42.2     Past Psychiatric History: Please see initial evaluation for full details. I have reviewed the history. No updates at this time.     Past Medical History:  Past Medical History:  Diagnosis Date  . Acid reflux   . Anxiety   . Cervical radiculitis   . Chronic constipation   . DDD (degenerative disc disease), lumbar   . Depression   . Dizzy spells   . Facial numbness   . Heart palpitations 01/2017  . History of hiatal hernia   . Hypertension   . Insomnia   . Iron deficiency anemia   . Migraines    occ  . Near syncope 01/2017  . Numbness and tingling in left arm   . Obesity   . Panic attacks   . Sciatica   . Seizures ([place])     Past Surgical History:  Procedure Laterality Date  . BUNIONECTOMY Left yrs ago  . CERVICAL ABLATION  2017  . COLONOSCOPY, ESOPHAGOGASTRODUODENOSCOPY (EGD) AND ESOPHAGEAL DILATION  N/A 12/03/2012   [id number] melanosis throughout the entire examined colon/The colon IS redundant/Small internal hemorrhoids/EGD:Esophageal web/Medium sized hiatal hernia/MILD Non-erosive gastritis  . ESOPHAGOGASTRODUODENOSCOPY  03/09/09   Dr. Wilford Corner, normal EGD, s/p Bravo capsule placement  . GASTRIC ROUX-EN-Y N/A 07/16/2017   Procedure: LAPAROSCOPIC ROUX-EN-Y GASTRIC BYPASS WITH UPPER ENDOSCOPY AND ERAS PATHWAY;  Surgeon: Johnathan Hausen, MD;  Location: [facility];  Service: General;  Laterality: N/A;  . LAPAROSCOPY N/A 07/20/2017   Procedure: LAPAROSCOPY DIAGNOSTIC. REDUCTION OF SMALL BOWEL OBSTRUCTION. REPAIR  OF TROCAR HERNIA.;  Surgeon: Alphonsa Overall, MD;  Location: [facility];  Service: General;  Laterality: N/A;  . TUBAL LIGATION    . WISDOM TOOTH EXTRACTION      Family Psychiatric History: Please see initial evaluation for full details. I have reviewed the history. No updates at this time.     Family History:  Family History  Problem Relation Age of Onset  . Diabetes Mother   . Hypertension Mother   . Drug abuse Mother   . Anxiety disorder Mother   . Depression Mother   . CAD Mother        CABG in 71s  . Hypertension Father   . Diabetes Sister   . Hypertension Sister   . Hypertension Brother   . Drug abuse Brother   . CAD Brother        s/p CABG in 67s  . Diabetes Paternal Grandmother   . Hypertension Brother   . Drug abuse Brother   . CAD Brother        "HEart artery blockages" in 62s  . Hypertension Brother   . Drug abuse Brother   . Cancer Maternal Grandmother        breast  . Anxiety disorder Maternal Grandmother   . Depression Maternal Grandmother   . Asthma Other   . Heart disease Other   . Colon cancer Neg Hx     Social History:  Social History   Socioeconomic History  . Marital status: Married    Spouse name: Not on file  . Number of children: 2  . Years of education: Not on file  . Highest education level: Not on file  Occupational History  . Occupation: [employer]    Employer: [employer]  . Financial resource strain: Not on file  . Food insecurity    Worry: Not on file    Inability: Not on file  . Transportation needs    Medical: Not on file    Non-medical: Not on file  Tobacco Use  . Smoking status: Former Smoker    Packs/day: 0.30    Years: 23.00    Pack years: 6.90    Types: Cigarettes    Quit date: 10/04/2012    Years since quitting: 6.0  . Smokeless tobacco: Never Used  . Tobacco comment: less than 1/2 pack cigarettes daily  Substance and Sexual Activity  . Alcohol use: Yes    Comment: once  every 2-3 months  . Drug use: No  . Sexual activity: Yes    Partners: Male    Birth control/protection: Surgical  Lifestyle  . Physical activity    Days per week: Not on file    Minutes per session: Not on file  . Stress: Not on file  Relationships  . Social Herbalist on phone: Not on file    Gets together: Not on file    Attends religious service: Not on file  Active member of club or organization: Not on file    Attends meetings of clubs or organizations: Not on file    Relationship status: Not on file  Other Topics Concern  . Not on file  Social History Narrative   Lives with husband and daughter in a one story home      Right handed      Highest level of edu- some college    Allergies: No Known Allergies  Metabolic Disorder Labs: No results found for: HGBA1C, MPG No results found for: PROLACTIN Lab Results  Component Value Date   CHOL 184 05/13/2012   TRIG 67 05/13/2012   HDL 47 05/13/2012   CHOLHDL 3.9 05/13/2012   VLDL 13 05/13/2012   LDLCALC 124 (H) 05/13/2012   Lab Results  Component Value Date   TSH 0.823 05/13/2012    Therapeutic Level Labs: No results found for: LITHIUM No results found for: VALPROATE No components found for:  CBMZ  Current Medications: Current Outpatient Medications  Medication Sig Dispense Refill  . acetaminophen (TYLENOL) 500 MG tablet Take 1,000 mg by mouth daily as needed for moderate pain or headache.    . Biotin 10000 MCG TABS Take 10,000 mcg by mouth daily.    Marland Kitchen CALCIUM PO Take 1 capsule by mouth daily.    . Cholecalciferol (VITAMIN D) 2000 units CAPS Take 2,000 Units by mouth daily.    . ferrous sulfate 325 (65 FE) MG EC tablet Take 325 mg by mouth daily with breakfast.    . gabapentin (NEURONTIN) 100 MG capsule Take 100 mg by mouth 3 (three) times daily as needed (for pain).     Marland Kitchen levETIRAcetam (KEPPRA) 250 MG tablet Take 1 tablet by mouth 3 (three) times daily.    . meclizine (ANTIVERT) 25 MG tablet Take  1 tablet by mouth 3 (three) times daily.    . pantoprazole (PROTONIX) 40 MG tablet Take 40 mg by mouth daily.    Marland Kitchen PREMPRO 0.45-1.5 MG tablet Take 1 tablet by mouth daily.     . sertraline (ZOLOFT) 100 MG tablet Take 1.5 tablets (150 mg total) by mouth daily. 135 tablet 0  . topiramate (TOPAMAX) 100 MG tablet Take 100 mg by mouth 2 (two) times daily.      No current facility-administered medications for this visit.      Musculoskeletal: Strength & Muscle Tone: N/A Gait & Station: N/A Patient leans: N/A  Psychiatric Specialty Exam: Review of Systems  Psychiatric/Behavioral: Positive for depression. Negative for hallucinations, memory loss, substance abuse and suicidal ideas. The patient is nervous/anxious and has insomnia.   All other systems reviewed and are negative.   There were no vitals taken for this visit.There is no height or weight on file to calculate BMI.  General Appearance: Fairly Groomed  Eye Contact:  Good  Speech:  Clear and Coherent  Volume:  Normal  Mood:  Irritable  Affect:  Appropriate, Congruent and Restricted  Thought Process:  Coherent  Orientation:  Full (Time, Place, and Person)  Thought Content: Logical   Suicidal Thoughts:  No  Homicidal Thoughts:  No  Memory:  Immediate;   Good  Judgement:  Good  Insight:  Fair  Psychomotor Activity:  Normal  Concentration:  Concentration: Good and Attention Span: Good  Recall:  Good  Fund of Knowledge: Good  Language: Good  Akathisia:  No  Handed:  Right  AIMS (if indicated): not done  Assets:  Communication Skills Desire for Improvement  ADL's:  Intact  Cognition: WNL  Sleep:  Poor   Screenings: PHQ2-9     Nutrition from 05/29/2017 in [facility]  PHQ-2 Total Score  0       Assessment and Plan:  Brooke [name] is a 48 y.o. year old female with a history of depression, spells of unresponsiveness, followed by neurology, migraine, hypertension, GERD, s/pRYGB 07/2017,  mild obstructive sleep apnea , who presents for follow up appointment for MDD (major depressive disorder), recurrent episode, mild ([place])  Mixed obsessional thoughts and acts  # MDD, mild, recurrent without psychotic features # OCD There has been overall improvement in anxiety and depressive symptoms since up titration of sertraline.  Psychosocial stressors includes unemployment, demoralization due to pain and seizure, and her husband's medical condition.  We will follow up titration of sertraline to target residual mood symptoms.  She will greatly benefit from CBT; she is encouraged to have follow-up with Ms. Bynum for therapy.    Plan 1. Increase sertraline 150 mg at night  2. Next appointment: in December 3. Referral to therapy  The patient demonstrates the following risk factors for suicide: Chronic risk factors for suicide include:psychiatric disorder ofdepression, OCDand chronic pain. Acute risk factorsfor suicide include: unemployment. Protective factorsfor this patient include: positive social support, responsibility to others (children, family), coping skills and hope for the future. Considering these factors, the overall suicide risk at this point appears to below. Patientisappropriate for outpatient follow up.  Brooke Clay, MD 10/08/2018, 8:59 AM

## 2018-10-01 ENCOUNTER — Ambulatory Visit (INDEPENDENT_AMBULATORY_CARE_PROVIDER_SITE_OTHER): Payer: BC Managed Care – PPO | Admitting: Cardiology

## 2018-10-01 ENCOUNTER — Other Ambulatory Visit: Payer: Self-pay

## 2018-10-01 ENCOUNTER — Encounter: Payer: Self-pay | Admitting: Cardiology

## 2018-10-01 VITALS — BP 113/73 | HR 80 | Temp 97.7°F | Ht 62.0 in | Wt 194.0 lb

## 2018-10-01 DIAGNOSIS — I10 Essential (primary) hypertension: Secondary | ICD-10-CM | POA: Diagnosis not present

## 2018-10-01 DIAGNOSIS — R079 Chest pain, unspecified: Secondary | ICD-10-CM

## 2018-10-01 DIAGNOSIS — Z8249 Family history of ischemic heart disease and other diseases of the circulatory system: Secondary | ICD-10-CM

## 2018-10-01 NOTE — Addendum Note (Signed)
Addended by: Nigel Mormon on: 10/01/2018 12:32 PM   Modules accepted: Orders
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
## 2018-10-08 ENCOUNTER — Encounter (HOSPITAL_COMMUNITY): Payer: Self-pay | Admitting: Psychiatry

## 2018-10-08 ENCOUNTER — Ambulatory Visit (INDEPENDENT_AMBULATORY_CARE_PROVIDER_SITE_OTHER): Payer: BC Managed Care – PPO | Admitting: Psychiatry

## 2018-10-08 ENCOUNTER — Other Ambulatory Visit: Payer: Self-pay

## 2018-10-08 DIAGNOSIS — F422 Mixed obsessional thoughts and acts: Secondary | ICD-10-CM | POA: Diagnosis not present

## 2018-10-08 DIAGNOSIS — F330 Major depressive disorder, recurrent, mild: Secondary | ICD-10-CM

## 2018-10-08 MED ORDER — SERTRALINE HCL 100 MG PO TABS: 150.0000 mg | ORAL_TABLET | Freq: Every day | ORAL | 0 refills | Status: DC

## 2018-10-08 NOTE — Patient Instructions (Signed)
1. Increase sertraline 150 mg at night  2. Next appointment: in December 3. Referral to therapy

## 2018-10-10 ENCOUNTER — Other Ambulatory Visit (HOSPITAL_COMMUNITY)
Admission: RE | Admit: 2018-10-10 | Discharge: 2018-10-10 | Disposition: A | Discharge status: Home / Self Care | Payer: BC Managed Care – PPO | Source: Ambulatory Visit | Attending: Neurology | Admitting: Neurology

## 2018-10-10 DIAGNOSIS — Z20828 Contact with and (suspected) exposure to other viral communicable diseases: Secondary | ICD-10-CM | POA: Insufficient documentation

## 2018-10-10 DIAGNOSIS — Z01812 Encounter for preprocedural laboratory examination: Secondary | ICD-10-CM | POA: Diagnosis not present

## 2018-10-13 ENCOUNTER — Encounter (HOSPITAL_COMMUNITY): Payer: Self-pay

## 2018-10-13 ENCOUNTER — Inpatient Hospital Stay (HOSPITAL_COMMUNITY): Admission: RE | Admit: 2018-10-13 | Payer: BC Managed Care – PPO | Source: Ambulatory Visit | Admitting: Neurology

## 2018-10-13 ENCOUNTER — Inpatient Hospital Stay (HOSPITAL_COMMUNITY): Payer: BC Managed Care – PPO

## 2018-10-13 ENCOUNTER — Inpatient Hospital Stay (HOSPITAL_COMMUNITY)
Admission: RE | Admit: 2018-10-13 | Discharge: 2018-10-16 | DRG: 880 | Disposition: A | Discharge status: Home / Self Care | Payer: BC Managed Care – PPO | Attending: Neurology | Admitting: Neurology

## 2018-10-13 ENCOUNTER — Other Ambulatory Visit: Payer: Self-pay

## 2018-10-13 DIAGNOSIS — K219 Gastro-esophageal reflux disease without esophagitis: Secondary | ICD-10-CM | POA: Diagnosis not present

## 2018-10-13 DIAGNOSIS — Z79899 Other long term (current) drug therapy: Secondary | ICD-10-CM | POA: Diagnosis not present

## 2018-10-13 DIAGNOSIS — F429 Obsessive-compulsive disorder, unspecified: Secondary | ICD-10-CM | POA: Diagnosis not present

## 2018-10-13 DIAGNOSIS — Z818 Family history of other mental and behavioral disorders: Secondary | ICD-10-CM | POA: Diagnosis not present

## 2018-10-13 DIAGNOSIS — R519 Headache, unspecified: Secondary | ICD-10-CM | POA: Diagnosis not present

## 2018-10-13 DIAGNOSIS — R569 Unspecified convulsions: Secondary | ICD-10-CM | POA: Diagnosis not present

## 2018-10-13 DIAGNOSIS — K5900 Constipation, unspecified: Secondary | ICD-10-CM | POA: Diagnosis present

## 2018-10-13 DIAGNOSIS — F410 Panic disorder [episodic paroxysmal anxiety] without agoraphobia: Secondary | ICD-10-CM | POA: Diagnosis present

## 2018-10-13 DIAGNOSIS — I10 Essential (primary) hypertension: Secondary | ICD-10-CM | POA: Diagnosis not present

## 2018-10-13 DIAGNOSIS — Z20828 Contact with and (suspected) exposure to other viral communicable diseases: Secondary | ICD-10-CM | POA: Diagnosis present

## 2018-10-13 DIAGNOSIS — F329 Major depressive disorder, single episode, unspecified: Secondary | ICD-10-CM

## 2018-10-13 DIAGNOSIS — Z87891 Personal history of nicotine dependence: Secondary | ICD-10-CM | POA: Diagnosis not present

## 2018-10-13 DIAGNOSIS — G43909 Migraine, unspecified, not intractable, without status migrainosus: Secondary | ICD-10-CM | POA: Diagnosis not present

## 2018-10-13 DIAGNOSIS — Z8719 Personal history of other diseases of the digestive system: Secondary | ICD-10-CM | POA: Diagnosis not present

## 2018-10-13 DIAGNOSIS — Z23 Encounter for immunization: Secondary | ICD-10-CM | POA: Diagnosis not present

## 2018-10-13 DIAGNOSIS — G40909 Epilepsy, unspecified, not intractable, without status epilepticus: Secondary | ICD-10-CM

## 2018-10-13 DIAGNOSIS — F445 Conversion disorder with seizures or convulsions: Principal | ICD-10-CM | POA: Diagnosis present

## 2018-10-13 DIAGNOSIS — E669 Obesity, unspecified: Secondary | ICD-10-CM | POA: Diagnosis not present

## 2018-10-13 LAB — CBC WITH DIFFERENTIAL/PLATELET
Abs Immature Granulocytes: 0.01 10*3/uL (ref 0.00–0.07)
Basophils Absolute: 0 10*3/uL (ref 0.0–0.1)
Basophils Relative: 1 %
Eosinophils Absolute: 0.1 10*3/uL (ref 0.0–0.5)
Eosinophils Relative: 1 %
HCT: 40.9 % (ref 36.0–46.0)
Hemoglobin: 13.2 g/dL (ref 12.0–15.0)
Immature Granulocytes: 0 %
Lymphocytes Relative: 45 %
Lymphs Abs: 1.8 10*3/uL (ref 0.7–4.0)
MCH: 30.1 pg (ref 26.0–34.0)
MCHC: 32.3 g/dL (ref 30.0–36.0)
MCV: 93.2 fL (ref 80.0–100.0)
Monocytes Absolute: 0.3 10*3/uL (ref 0.1–1.0)
Monocytes Relative: 7 %
Neutro Abs: 1.9 10*3/uL (ref 1.7–7.7)
Neutrophils Relative %: 46 %
Platelets: 355 10*3/uL (ref 150–400)
RBC: 4.39 MIL/uL (ref 3.87–5.11)
RDW: 13.2 % (ref 11.5–15.5)
WBC: 4.1 10*3/uL (ref 4.0–10.5)
nRBC: 0 % (ref 0.0–0.2)

## 2018-10-13 LAB — RAPID URINE DRUG SCREEN, HOSP PERFORMED
Amphetamines: NOT DETECTED
Barbiturates: NOT DETECTED
Benzodiazepines: NOT DETECTED
Cocaine: NOT DETECTED
Opiates: NOT DETECTED
Tetrahydrocannabinol: NOT DETECTED

## 2018-10-13 LAB — COMPREHENSIVE METABOLIC PANEL
ALT: 38 U/L (ref 0–44)
AST: 35 U/L (ref 15–41)
Albumin: 3.5 g/dL (ref 3.5–5.0)
Alkaline Phosphatase: 78 U/L (ref 38–126)
Anion gap: 9 (ref 5–15)
BUN: 10 mg/dL (ref 6–20)
CO2: 21 mmol/L — ABNORMAL LOW (ref 22–32)
Calcium: 8.2 mg/dL — ABNORMAL LOW (ref 8.9–10.3)
Chloride: 108 mmol/L (ref 98–111)
Creatinine, Ser: 0.97 mg/dL (ref 0.44–1.00)
GFR calc Af Amer: >60 mL/min (ref >60)
GFR calc non Af Amer: >60 mL/min (ref >60)
Glucose, Bld: 86 mg/dL (ref 70–99)
Potassium: 4 mmol/L (ref 3.5–5.1)
Sodium: 138 mmol/L (ref 135–145)
Total Bilirubin: 0.5 mg/dL (ref 0.3–1.2)
Total Protein: 6.2 g/dL — ABNORMAL LOW (ref 6.5–8.1)

## 2018-10-13 LAB — PHOSPHORUS: Phosphorus: 3.1 mg/dL (ref 2.5–4.6)

## 2018-10-13 LAB — PROTIME-INR
INR: 1 (ref 0.8–1.2)
Prothrombin Time: 13.3 seconds (ref 11.4–15.2)

## 2018-10-13 LAB — NOVEL CORONAVIRUS, NAA (HOSP ORDER, SEND-OUT TO REF LAB; TAT 18-24 HRS): SARS-CoV-2, NAA: NOT DETECTED

## 2018-10-13 LAB — HIV ANTIBODY (ROUTINE TESTING W REFLEX): HIV Screen 4th Generation wRfx: NONREACTIVE

## 2018-10-13 LAB — MAGNESIUM: Magnesium: 2.2 mg/dL (ref 1.7–2.4)

## 2018-10-13 MED ORDER — TOPIRAMATE 100 MG PO TABS
100.0000 mg | ORAL_TABLET | Freq: Every day | ORAL | Status: DC
  Administered 2018-10-13 – 2018-10-14 (×2): 100 mg via ORAL
  Filled 2018-10-13 (×2): qty 1

## 2018-10-13 MED ORDER — LEVETIRACETAM 500 MG PO TABS
500.0000 mg | ORAL_TABLET | Freq: Every day | ORAL | Status: DC
  Administered 2018-10-13 – 2018-10-14 (×2): 500 mg via ORAL
  Filled 2018-10-13 (×2): qty 1

## 2018-10-13 MED ORDER — LORAZEPAM 2 MG/ML IJ SOLN
2.0000 mg | INTRAMUSCULAR | Status: DC | PRN
  Filled 2018-10-13: qty 1

## 2018-10-13 MED ORDER — SERTRALINE HCL 100 MG PO TABS
100.0000 mg | ORAL_TABLET | Freq: Every day | ORAL | Status: DC
  Administered 2018-10-13 – 2018-10-15 (×3): 100 mg via ORAL
  Filled 2018-10-13 (×3): qty 1

## 2018-10-13 MED ORDER — INFLUENZA VAC SPLIT QUAD 0.5 ML IM SUSY
0.5000 mL | PREFILLED_SYRINGE | INTRAMUSCULAR | Status: AC
  Administered 2018-10-14: 0.5 mL via INTRAMUSCULAR
  Filled 2018-10-13: qty 0.5

## 2018-10-13 MED ORDER — LABETALOL HCL 5 MG/ML IV SOLN: 5.0000 mg | INTRAVENOUS | Status: DC | PRN

## 2018-10-13 MED ORDER — ACETAMINOPHEN 650 MG RE SUPP: 650.0000 mg | RECTAL | Status: DC | PRN

## 2018-10-13 MED ORDER — ENOXAPARIN SODIUM 40 MG/0.4ML ~~LOC~~ SOLN
40.0000 mg | Freq: Every day | SUBCUTANEOUS | Status: DC
  Administered 2018-10-13 – 2018-10-16 (×4): 40 mg via SUBCUTANEOUS
  Filled 2018-10-13 (×4): qty 0.4

## 2018-10-13 MED ORDER — ACETAMINOPHEN 325 MG PO TABS
650.0000 mg | ORAL_TABLET | ORAL | Status: DC | PRN
  Administered 2018-10-13 – 2018-10-16 (×4): 650 mg via ORAL
  Filled 2018-10-13 (×4): qty 2

## 2018-10-13 NOTE — Progress Notes (Signed)
Patient arrived to EMU. Dr. Hortense Ramal at bedside. Pt A&O x4. Husband at bedside. Patient oriented to room and educated on EEG button along with call bell. Educated on fall precautions. EEG tech hooking pt up to EEG now. Patient belongings placed in closet, clothes, purse, and wallet. Patient did not want belongings sent to security. Nurse will continue to monitor. [place]

## 2018-10-13 NOTE — Progress Notes (Signed)
EEG leads checked. Re gelled electrodes F7, F4 Fz T4. Pt needs are met at this time. Continue to monitor

## 2018-10-13 NOTE — Progress Notes (Signed)
Nurse called into room at 1129 for patient being unresponsive. EEG tech in room simulating patient to have seizure like activity. Patient's eyes were closed. Right arm become stiff then left arm. Patient was seen grinding her teeth and moaning. Patient was unable to answer questions or follow commands. Episode lasted approximately 10-11 minutes. Patient remained safe, no injuries. Patient is now A&O x4. Complaint is headache 4/10. Nurse will continue to monitor. [place]

## 2018-10-13 NOTE — Progress Notes (Signed)
Late entry:  EMU EEG started at 9:20. Educated patient and family regarding push button and nurse call button.
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
## 2018-10-14 DIAGNOSIS — F445 Conversion disorder with seizures or convulsions: Principal | ICD-10-CM

## 2018-10-14 DIAGNOSIS — Z8719 Personal history of other diseases of the digestive system: Secondary | ICD-10-CM

## 2018-10-14 MED ORDER — PANTOPRAZOLE SODIUM 40 MG PO TBEC
40.0000 mg | DELAYED_RELEASE_TABLET | Freq: Every day | ORAL | Status: DC
  Administered 2018-10-14 – 2018-10-16 (×3): 40 mg via ORAL
  Filled 2018-10-14 (×3): qty 1

## 2018-10-14 NOTE — Progress Notes (Signed)
Subjective: Had another event this morning around 10:15 AM similar to the event yesterday the patient was unresponsive with bilateral upper extremity stiffening.  Concomitant EEG did not show seizures.  Patient reports mild headache.  ROS: negative except above   Examination  Vital signs in last 24 hours: Temp:  [97.7 F (36.5 C)-98.5 F (36.9 C)] 98 F (36.7 C) (10/13 0851) Pulse Rate:  [46-65] 51 (10/13 1024) Resp:  [15-22] 22 (10/13 1024) BP: (102-118)/(52-72) 118/65 (10/13 1024) SpO2:  [97 %-100 %] 98 % (10/13 1024) Weight:  [88 kg] 88 kg (10/12 1256)  General: lying in bed, not in apparent distress CVS: pulse-normal rate and rhythm RS: breathing comfortably Extremities: normal   Neuro: MS: Alert, oriented, follows commands CN: pupils equal and reactive,  EOMI, face symmetric, tongue midline, normal sensation over face, Motor: 5/5 strength in all 4 extremities Reflexes: 2+ bilaterally over patella, biceps, plantars: flexor Coordination: normal Gait: not tested  Basic Metabolic Panel: Recent Labs  Lab 10/13/18 0947  NA 138  K 4.0  CL 108  CO2 21*  GLUCOSE 86  BUN 10  CREATININE 0.97  CALCIUM 8.2*  MG 2.2  PHOS 3.1    CBC: Recent Labs  Lab 10/13/18 0947  WBC 4.1  NEUTROABS 1.9  HGB 13.2  HCT 40.9  MCV 93.2  PLT 355     Coagulation Studies: Recent Labs    10/13/18 0947  LABPROT 13.3  INR 1.0     ASSESSMENT AND PLAN  Ms. Brooke [name] is a 48 year old female who was admitted to epilepsy monitoring unit for characterization of spells.   Transient alteration of awareness Transient jerking movements Psychogenic nonepileptic events -We have captured 1 type of her event however we have still not been able to capture episodes of full body shaking. Continue video EEG monitoring for characterization of spells.  -We will hold her a.m. dose of Keppra and topiramate, will continue Keppra 500 mg nightly and Topamax 100 mg nightly tonight -We were only  able to do photic stimulation yesterday as patient had an event.  Will proceed with hyperventilation today -Discussed with the patient and her husband that the episode she had yesterday and again this morning are nonepileptic events.  Discussed the importance of cognitive behavioral therapy and prognosis of psychogenic nonepileptic events -Seizure precaution -PRN IV Ativan 2 mg for generalized tonic-clonic seizure lasting more than 2 minutes, focal seizure lasting more than 5 minutes  Migraine -Held a.m. dose of topiramate at the development antiseizure medication.  We will continue Topamax 100 mg nightly - PRN tylenol  Depression OCD -Continue home dose of sertraline 100mg  qhs  GERD -Continue home Protonix  Obesity - Complicates all aspects of care   Diet: PO DVT prophylaxis: SCD, Lovenox Disposition:   Anticipate discharge home pending characterization of spells

## 2018-10-14 NOTE — Progress Notes (Signed)
Maintenance on EMU patient completed; no skin breakdown was seen, reprepped and reglued Fp2 and A1. Also retested event button. No skin complaints from patient at this time.

## 2018-10-14 NOTE — Progress Notes (Signed)
Nurse called into room at 1118 for suspected event. Pt unresponsive, not following commands, with lip movement and fist clenching bilaterally. Episode lasted 3 minutes. Pt now alert and oriented, states she is "just sleepy". No injuries noted, will continue to monitor.

## 2018-10-14 NOTE — Progress Notes (Signed)
Pt had a peaceful quiet night, no seizure- like activity observed during the shift, still c/o of nagging slight headache but refused any medication, husband resting at bedside, EEG Leads still intact and recording accordingly, pt and family was however reassured and will continue to monitor, v/s stable. [place]-Asidi, Garlin Batdorf Efe

## 2018-10-14 NOTE — Procedures (Addendum)
Patient Name: Brooke [name]  MRN: [id number]  Epilepsy Attending: Lora Havens  Referring Physician/Provider: Dr Zeb Comfort Duration: 10/13/2018 [id number] to 10/13/020 0816  Patient history: 48 year old female with episodes concerning for seizure-like activity.  EEG to evaluate for seizures  Level of alertness: Awake, asleep  AEDs during EEG study: Keppra, topiramate  Technical aspects: This EEG study was done with scalp electrodes positioned according to the 10-20 International system of electrode placement. Electrical activity was acquired at a sampling rate of 500Hz  and reviewed with a high frequency filter of 70Hz  and a low frequency filter of 1Hz . EEG data were recorded continuously and digitally stored.   Description: During awake state, the posterior dominant rhythm consists of 11-12 Hz activity of moderate voltage (25-35 uV) seen predominantly in posterior head regions, symmetric and reactive to eye opening and eye closing.  Sleep was characterized by vertex waves, sleep spindles (12 to 14 Hz), maximal frontocentral.  Physiologic photic driving was not seen during photic stimulation  Event 1E was captured on 10/13/2018 at 1127.  During the event, her eyes were closed, bilateral upper extremities were extended and she was not responding to commands.  This lasted for about 15 minutes.  Toward the end, patient was able to nod and follow commands but was not speaking.  Eventually when she opened her eyes she reported remembering the event and complained of headache.  Concomitant EEG did not show any change to suggest seizures, posterior dominant rhythm was visible throughout.   IMPRESSION: This study is within normal limits. No seizures or epileptiform discharges were seen throughout the recording.  1E was captured on 10/13/2018 1127 during which patient was laying down with eyes closed, bilateral upper extremities were extended and she was not following commands lasting about 15  minutes.  Concomitant EEG did not show any change to suggest seizure.  This was a nonepileptic event.

## 2018-10-15 DIAGNOSIS — K5900 Constipation, unspecified: Secondary | ICD-10-CM

## 2018-10-15 LAB — BASIC METABOLIC PANEL
Anion gap: 8 (ref 5–15)
BUN: 10 mg/dL (ref 6–20)
CO2: 18 mmol/L — ABNORMAL LOW (ref 22–32)
Calcium: 8.4 mg/dL — ABNORMAL LOW (ref 8.9–10.3)
Chloride: 112 mmol/L — ABNORMAL HIGH (ref 98–111)
Creatinine, Ser: 0.96 mg/dL (ref 0.44–1.00)
GFR calc Af Amer: >60 mL/min (ref >60)
GFR calc non Af Amer: >60 mL/min (ref >60)
Glucose, Bld: 90 mg/dL (ref 70–99)
Potassium: 4.2 mmol/L (ref 3.5–5.1)
Sodium: 138 mmol/L (ref 135–145)

## 2018-10-15 LAB — LEVETIRACETAM LEVEL: Levetiracetam Lvl: 36.5 ug/mL (ref 10.0–40.0)

## 2018-10-15 LAB — MAGNESIUM: Magnesium: 1.8 mg/dL (ref 1.7–2.4)

## 2018-10-15 MED ORDER — MAGNESIUM HYDROXIDE 400 MG/5ML PO SUSP
15.0000 mL | Freq: Every day | ORAL | Status: DC | PRN
  Administered 2018-10-15: 15 mL via ORAL
  Filled 2018-10-15: qty 30

## 2018-10-15 NOTE — Progress Notes (Signed)
Late entry: 19:00 EEG maint complete. Fixed frontal leads. Checked EKG leads. No skin breakdown. Tested event button. Continue to monitor

## 2018-10-15 NOTE — Progress Notes (Signed)
EMU maint. Complete adjusted electrodes C3,CZ,P4. No skin breakdown at [place], F8. Event button tested

## 2018-10-15 NOTE — Progress Notes (Addendum)
Subjective: No acute events overnight.  Had 1 more episode of unresponsiveness yesterday morning.  Reports having constipation, takes milk of magnesia at home if needed.   RN reported overnight patient's heart rate was in the 40s when patient was sleeping, no signs of obstructive sleep apnea or changes on telemetry.  Heart rate returned to baseline once patient was stimulated/awake   ROS: negative except above Examination  Vital signs in last 24 hours: Temp:  [97.6 F (36.4 C)-98.6 F (37 C)] 98.6 F (37 C) (10/14 0934) Pulse Rate:  [49-63] 55 (10/14 0740) Resp:  [18-21] 20 (10/14 0740) BP: (102-124)/(54-80) 124/80 (10/14 0740) SpO2:  [96 %-99 %] 96 % (10/14 0740)  General: lying in bed, not in apparent distress CVS: pulse-normal rate and rhythm RS: breathing comfortably Extremities: normal, warm  Neuro: MS: Alert, oriented, follows commands CN: pupils equal and reactive,  EOMI, face symmetric, tongue midline, normal sensation over face, Motor: 5/5 strength in all 4 extremities Reflexes: 2+ bilaterally over patella, biceps, plantars: flexor Coordination: normal Gait: not tested  Basic Metabolic Panel: Recent Labs  Lab 10/13/18 0947 10/15/18 1003  NA 138 138  K 4.0 4.2  CL 108 112*  CO2 21* 18*  GLUCOSE 86 90  BUN 10 10  CREATININE 0.97 0.96  CALCIUM 8.2* 8.4*  MG 2.2 1.8  PHOS 3.1  --     CBC: Recent Labs  Lab 10/13/18 0947  WBC 4.1  NEUTROABS 1.9  HGB 13.2  HCT 40.9  MCV 93.2  PLT 355     Coagulation Studies: Recent Labs    10/13/18 0947  LABPROT 13.3  INR 1.0    ASSESSMENT AND PLAN  Brooke [name] is a 48 year old female who was admitted to epilepsy monitoring unit for characterization of spells.   Transient alteration of awareness Transient jerking movements Psychogenic nonepileptic events -We have captured 1 type of her event however we have still not been able to capture episodes of full body shaking.  The full body shaking spells have  been gradually becoming more frequent and have never been captured before. -We would like to continue video EEG monitoring to characterize these episodes of full body seizures whether they are epileptic or nonepileptic -We will hold Keppra and topiramate -we will also sleep deprive her  as patient states stress can trigger her episodes -Seizure precaution -PRN IVAtivan 2 mg for generalized tonic-clonic seizure lasting more than 2 minutes, focal seizure lasting more than 5 minutes  Migraine -Held topiramate while we are trying to characterize her whole body shaking episodes - PRN tylenol  Depression OCD -Continue home dose of sertraline100mg  qhs  GERD -Continue home Protonix  Constipation -Resume milk of magnesia as needed  Obesity - Complicates all aspects of care   Diet:PO DVT prophylaxis: SCD, Lovenox Disposition: Anticipate discharge home pending characterization of spells

## 2018-10-15 NOTE — Procedures (Addendum)
Patient Name: Brooke [name]  MRN: [id number]  Epilepsy Attending: Lora Havens  Referring Physician/Provider: Dr Zeb Comfort Duration: 10/13/2018 [id number] to 10/13/020 0816  Patient history: 48 year old female with episodes concerning for seizure-like activity.  EEG to evaluate for seizures  Level of alertness: Awake, asleep  AEDs during EEG study: Keppra, topiramate  Technical aspects: This EEG study was done with scalp electrodes positioned according to the 10-20 International system of electrode placement. Electrical activity was acquired at a sampling rate of 500Hz  and reviewed with a high frequency filter of 70Hz  and a low frequency filter of 1Hz . EEG data were recorded continuously and digitally stored.   Description: During awake state, the posterior dominant rhythm consists of 11-12 Hz activity of moderate voltage (25-35 uV) seen predominantly in posterior head regions, symmetric and reactive to eye opening and eye closing.  Sleep was characterized by vertex waves, sleep spindles (12 to 14 Hz), maximal frontocentral.  Physiologic photic driving was not seen during photic stimulation  Event 2E was captured on 10/14/2018 at 1016.  During the event, her eyes were closed, bilateral upper extremities were extended and she was not responding to commands.  This lasted for about 3 minutes.  Toward the end, patient was able to nod and follow commands but was not speaking.  Eventually when she opened her eyes she again reported remembering the event and complained of headache.  Concomitant EEG did not show any change to suggest seizures, posterior dominant rhythm was visible throughout.   IMPRESSION: This study is within normal limits. No seizures or epileptiform discharges were seen throughout the recording.  2E was captured on 10/14/2018 1016 during which patient was laying down with eyes closed, bilateral upper extremities were extended and she was not following commands lasting  about 3 minutes.  Concomitant EEG did not show any change to suggest seizure.  This was a nonepileptic event.

## 2018-10-16 MED ORDER — LEVETIRACETAM 500 MG PO TABS
500.0000 mg | ORAL_TABLET | Freq: Once | ORAL | Status: AC
  Administered 2018-10-16: 500 mg via ORAL
  Filled 2018-10-16: qty 1

## 2018-10-16 MED ORDER — TOPIRAMATE 100 MG PO TABS
100.0000 mg | ORAL_TABLET | Freq: Once | ORAL | Status: AC
  Administered 2018-10-16: 100 mg via ORAL
  Filled 2018-10-16: qty 1

## 2018-10-16 MED ORDER — LEVETIRACETAM ER 500 MG PO TB24: 500.0000 mg | ORAL_TABLET | ORAL | Status: DC

## 2018-10-16 NOTE — Discharge Summary (Addendum)
Physician Discharge Summary  Brooke [name] ID: Brooke [name] MRN: [id number] DOB/AGE: 01/02/1970 48 y.o.  Admit date: 10/13/2018 Discharge date: 10/16/2018  Admission Diagnoses: Convulsions  Discharge Diagnoses:  Active Problems:   Convulsion [facility])  Discharged Condition: good  Hospital Course: Brooke [name] was having seizure-like episodes.  She was admitted for characterization of these episodes.  She had video EEG monitoring done from 10/12/2022to  10/16/2018.  We were able to capture 3 typical events.  During event 1E and 2E, Brooke [name] stopped responding to questions, her eyes were closed and her upper extremities appeared stiff.  During 3E, Brooke [name] was getting photic stimulation , she stopped responding, her eyes were eyes closed, she had stiffening of all 4 extremities along with posturing and vocalizations.  She was resisting eye opening, there was no gaze deviation. Concomitant EEG during all these episodes showed normal posterior dominant rhythm without any EEG change to suggest seizures.  The diagnosis of psychogenic nonepileptic events (PNES) was discussed in detail with Brooke [name] and her husband.  We also discussed about tapering her off Keppra and the importance of cognitive behavioral therapy.  Brooke [name] expressed understanding and states she is motivated to get these episodes under control.  At the time of discharge, we will reduce her Keppra to 500 mg twice daily (seizure precautions including do not drive for 6 months with discussed in detail.  She will follow up with Dr. Ellouise Newer.  Psychogenic nonepileptic spells -Discussed seizure precautions - Discussed diagnosis PNES, importance of CBT -Follow-up with Dr. Ellouise Newer  Depression OCD -Continue home dose of sertraline100mg  qhs  GERD -Continue home Protonix  Constipation -Continue milk of magnesia as needed  Obesity - Complicates all aspects of care   Consults: None   Significant Diagnostic Studies: Keppra level  was 36.5 on 10/13/2018.  Treatments:  -Continue Keppra 500 mg twice daily (reduced from 1000 mg every morning and 500 mg nightly) -Continue topiramate 100 mg twice daily -Of note, Brooke [name] stated she has not been taking gabapentin therefore it was discontinued on her home medication list   Discharge Exam: Blood pressure 131/86, pulse 70, temperature 98.3 F (36.8 C), temperature source Oral, resp. rate (!) 24, height 5\' 2"  (1.575 m), weight 88 kg, SpO2 98 %.  General: lying in bed, not in apparent distress CVS: pulse-normal rate and rhythm RS: breathing comfortably Extremities: normal, warm  Neuro: MS: Alert, oriented, follows commands CN: pupils equal and reactive,  EOMI, face symmetric, tongue midline, normal sensation over face, Motor: 5/5 strength in all 4 extremities Reflexes: 2+ bilaterally over patella, biceps, plantars: flexor Coordination: normal Gait: not tested  Disposition:  Allergies as of 10/16/2018   No Known Allergies     Medication List    STOP taking these medications   gabapentin 100 MG capsule Commonly known as: NEURONTIN     TAKE these medications   acetaminophen 500 MG tablet Commonly known as: TYLENOL Take 1,000 mg by mouth daily as needed for moderate pain or headache.   Biotin 10000 MCG Tabs Take 10,000 mcg by mouth daily.   CALCIUM PO Take 1 capsule by mouth daily.   ferrous sulfate 325 (65 FE) MG EC tablet Take 325 mg by mouth daily with breakfast.   levETIRAcetam 500 MG 24 hr tablet Commonly known as: KEPPRA XR Take 1 tablet (500 mg total) by mouth See admin instructions. Take 500mg  twice daily What changed:   how much to take  additional instructions   meclizine 25 MG tablet Commonly known as: ANTIVERT  Take 25 mg by mouth 3 (three) times daily as needed for dizziness or nausea.   pantoprazole 40 MG tablet Commonly known as: PROTONIX Take 40 mg by mouth daily.   Prempro 0.45-1.5 MG tablet Generic drug: estrogen  (conjugated)-medroxyprogesterone Take 1 tablet by mouth daily.   sertraline 100 MG tablet Commonly known as: ZOLOFT Take 1.5 tablets (150 mg total) by mouth daily. What changed: when to take this   topiramate 100 MG tablet Commonly known as: TOPAMAX Take 100 mg by mouth 2 (two) times daily.   Vitamin D 50 MCG (2000 UT) Caps Take 2,000 Units by mouth daily.        Discharge Instructions  The hospital and staff members extend their best wishes to you as you are discharged. Because we are most concerned with your health, we suggest you carefully read the following instructions.  Activity Instructions Resume regular activities except seizure precautions  Restrictions: Seizure precautions including do not drive for 6 months, until cleared by physician  YOU ARE TO CONTINUE PREADMISSION DIET UNLESS OTHERWISE INSTRUCTED  Diet: Regular heart healthy diet  Remember to contact your doctor for follow-up as instructed  Follow-up to Dr. Ellouise Newer.   If you have problems related to your current illness/procedures or if your symptoms persist or worsen, please contact your doctor.  Individual Instructions/Custom Documents/Teaching Sheet Use this area to list any individualized instructions, appointment, continued therapies, etc, that the Brooke [name]/family is to continue after discharge. If additional space is needed, use "Additional Instructions"  Additional Instructions related to continuing or unresolved problems  Weight Management is important to your health. Call your physician if you experience sudden weight loss or weight gain.  IV Sites - notify your doctor for any extreme redness or observed drainage from any old IV site.        I have spent a total of  35  minutes with the Brooke [name] reviewing hospital notes,  test results, labs and examining the Brooke [name] as well as establishing an assessment and plan that was discussed personally with the Brooke [name].  > 50% of time was spent  in direct Brooke [name] care. Time was spent counselling Brooke [name] about non epileptic events, CBT, discussing medication management, follow up and seizure precautions.

## 2018-10-16 NOTE — Progress Notes (Signed)
Nurse called to patient's room around 0950. Dr. Hortense Ramal at bedside. Patient seen lying in bed, arms stiff, hands clinched, legs stiff, eyes tightly closed, lip smacking, unresponsive. HR elevated around 125, other VS WNL. Episode lasted around 4 minutes. HR returned to 70-80s when patient returned to baseline. Patient is now A&O x4. Complaints of headache, nurse gave Tylenol. Nurse will continue to monitor. [place]

## 2018-10-16 NOTE — Progress Notes (Signed)
Patient is discharging home. IV taken out. EEG electrodes removed. All discharge paperwork went over with patient and husband, all questions and concerns addressed. Patient to be taken down in wheelchair. [place]

## 2018-10-16 NOTE — Discharge Instructions (Signed)
You were admitted in our epilepsy monitoring unit from 10/13/2018 to 10/11/2018.  We were able to capture 3 of your typical events which were diagnosed as nonepileptic events.  Please follow up with your neurologist Dr. Ellouise Newer as well as your psychiatrist for cognitive behavioral therapy.  Please continue to follow seizure precautions including do not drive for 6 months.   Please refer to the following link if you would like more information about your diagnosis. 1. Psychogenic Nonepileptic Seizures: A Guide (2014) By Ardine Eng. [employer].   2.  Reiter JM, Kandee Keen, Reiter C, LaFrance Jr WC [phone]). Taking Control of Your Seizures: Workbook. [employer]

## 2018-10-16 NOTE — Progress Notes (Signed)
Patient has been seem fine in this shift, even though unable to sleep much, awaked most of the night, maybe slept 2-3 hours, no any seizure episode noted other than multiples XL Spikes on the EEG monitor; VTs are WNL, and will continue to monitor.

## 2018-10-16 NOTE — Procedures (Signed)
Patient Name:Brooke [name] [id number] Epilepsy Attending:Shaindel Sweeten Barbra Sarks Referring Physician/Provider:Dr Zeb Comfort Duration:10/16/2018 0817 to 10/15/020 1215  Patient history:48 year old female with episodes concerning for seizure-like activity. EEG to evaluate for seizures  Level of alertness:Awake, asleep  AEDs during EEG study:Keppra, topiramate  Technical aspects: This EEG study was done with scalp electrodes positioned according to the 10-20 International system of electrode placement. Electrical activity was acquired at a sampling rate of 500Hz  and reviewed with a high frequency filter of 70Hz  and a low frequency filter of 1Hz . EEG data were recorded continuously and digitally stored.  Description:During awake state, the posterior dominant rhythm consists of11-12Hz  activity of moderate voltage (25-35 uV) seen predominantly in posterior head regions, symmetric and reactive to eye opening and eye closing.Sleep was characterized by vertex waves, sleep spindles (12 to 14 Hz), maximal frontocentral.Physiologic photic driving was not seen during photic stimulation  Event 3Ewas captured on 10/16/2018 at 0945.During the event, she initially reported chest tightness during photic stimulation. She then stopped responding. Her eyes were closed, bilateral upper and lower extremities were extended and she was crying/moaning. She was resisting eye openingand had fluctuating stiffness. This lasted for about7 minutes.Toward the end, patient was able to nod and follow commands but was not speaking. Eventually when she opened her eyes and again reported remembering the event and complained of headache.Concomitant EEG did not show any change to suggest seizures, posterior dominant rhythm was visible throughout.   IMPRESSION: This study is within normal limits. No seizures or epileptiform discharges were seen throughout the recording.  3E was captured on  10/16/2018 0945 as  Described above lasting about 7 minutes. Concomitant EEG did not show any change to suggest seizure. This was a nonepileptic event.

## 2018-10-16 NOTE — Progress Notes (Signed)
LTM EEG discontinued - small scratch in FP2 area - patient said she believes its from where she scratched it.

## 2018-10-16 NOTE — Procedures (Signed)
Patient Name:Brooke [name] [id number] Epilepsy Attending:Ranay Ketter Barbra Sarks Referring Physician/Provider:Dr Zeb Comfort Duration:10/14/2018 [phone] to 10/14/020 [id number]  Patient history:48 year old female with episodes concerning for seizure-like activity. EEG to evaluate for seizures  Level of alertness:Awake, asleep  AEDs during EEG study:Keppra, topiramate  Technical aspects: This EEG study was done with scalp electrodes positioned according to the 10-20 International system of electrode placement. Electrical activity was acquired at a sampling rate of 500Hz  and reviewed with a high frequency filter of 70Hz  and a low frequency filter of 1Hz . EEG data were recorded continuously and digitally stored.  Description:During awake state, the posterior dominant rhythm consists of11-12Hz  activity of moderate voltage (25-35 uV) seen predominantly in posterior head regions, symmetric and reactive to eye opening and eye closing.Sleep was characterized by vertex waves, sleep spindles (12 to 14 Hz), maximal frontocentral.Physiologic photic driving was not seen during photic stimulation  IMPRESSION: This study is within normal limits. No seizures or epileptiform discharges were seen throughout the recording.

## 2018-10-23 DIAGNOSIS — G43919 Migraine, unspecified, intractable, without status migrainosus: Secondary | ICD-10-CM | POA: Diagnosis not present

## 2018-10-23 DIAGNOSIS — I10 Essential (primary) hypertension: Secondary | ICD-10-CM | POA: Diagnosis not present

## 2018-10-23 DIAGNOSIS — K219 Gastro-esophageal reflux disease without esophagitis: Secondary | ICD-10-CM | POA: Diagnosis not present

## 2018-10-23 DIAGNOSIS — G40909 Epilepsy, unspecified, not intractable, without status epilepticus: Secondary | ICD-10-CM | POA: Diagnosis not present

## 2018-10-29 ENCOUNTER — Other Ambulatory Visit: Payer: Self-pay

## 2018-10-29 ENCOUNTER — Ambulatory Visit (HOSPITAL_COMMUNITY): Payer: BC Managed Care – PPO | Admitting: Psychiatry

## 2018-10-30 ENCOUNTER — Other Ambulatory Visit: Payer: BC Managed Care – PPO

## 2018-11-03 ENCOUNTER — Other Ambulatory Visit: Payer: BC Managed Care – PPO

## 2018-11-06 ENCOUNTER — Other Ambulatory Visit: Payer: BC Managed Care – PPO

## 2018-11-06 DIAGNOSIS — Z5329 Procedure and treatment not carried out because of patient's decision for other reasons: Secondary | ICD-10-CM

## 2018-11-10 ENCOUNTER — Telehealth (HOSPITAL_COMMUNITY): Payer: Self-pay | Admitting: *Deleted

## 2018-11-10 ENCOUNTER — Other Ambulatory Visit (HOSPITAL_COMMUNITY): Payer: Self-pay | Admitting: Psychiatry

## 2018-11-10 MED ORDER — HYDROXYZINE HCL 25 MG PO TABS: 25.0000 mg | ORAL_TABLET | Freq: Two times a day (BID) | ORAL | 1 refills | Status: DC | PRN

## 2018-11-10 NOTE — Telephone Encounter (Signed)
She can try hydroxyzine as needed if she is interested. Please also remind her that although she takes sertraline at night, it will be also effective during the day. Please also advise her to have follow up with Ms. Bynum for therapy.

## 2018-11-10 NOTE — Telephone Encounter (Signed)
PATIENT CALLED STATED" THAT SHE IS TAKING SERTRALINE 150 MG  @ NIGHT BUT IS REQUESTING SOMETHING FOR DAY TIME", " STATES HER ANXIETY IS @ 10,000 WITH SHAKES DUE TO HOME SCHOOLING DAUGHTER & 48 Y.O. GRANDDAUGHTER PLUS TAKING ONLINE CLASSES HERSELF"

## 2018-11-10 NOTE — Telephone Encounter (Signed)
SPOKE WITH PATIENT TO INFORM PER PROVIDER: Ordered hydroxyzine 25 mg twice a day as needed for anxiety. Please instruct her not to take this medication before driving as it can make her feel dizzy. Ordered hydroxyzine 25 mg twice a day as needed for anxiety. Please instruct her not to take this medication before driving as it can make her feel dizzy. Please advise her that I would usually encourage people to try at least three times seeing a therapist given the initial phase would be more for evaluation rather than working on for goals for the treatment.   ALSO PER PROVIDER : advise her that I would usually encourage people to try at least three times seeing a therapist given the initial phase would be more for evaluation rather than working on for goals for the treatment.   PATIENT STILL HAS NO INTEREST IN CONTINUING THERAPY

## 2018-11-10 NOTE — Telephone Encounter (Signed)
Ordered hydroxyzine 25 mg twice a day as needed for anxiety. Please instruct her not to take this medication before driving as it can make her feel dizzy. Please advise her that I would usually encourage people to try at least three times seeing a therapist given the initial phase would be more for evaluation rather than working on for goals for the treatment.

## 2018-11-10 NOTE — Telephone Encounter (Signed)
SPOKE WITH PATIENT TO INFORM PER PROVIDER:  She can try hydroxyzine as needed if she is interested. Please also remind her that although she takes sertraline at night, it will be also effective during the day. Please also advise her to have follow up with Ms. Bynum for therapy  PATIENT IS INTERESTED IN TRYING THE HYDROXYZINE.  AND PER PROVIDER: also advise her to have follow up with Ms. Bynum for therapy.  PATIENT STATES' SHE NEEDS TO SPEAK WITH YOU ABOUT THAT. STATES THAT SHE HAS 2 APPOINTMENT WITH THERAPIST  & "ISN'T GETTING ANYTHING OUT OF IT,  FEELS SHE GET'S MORE OUT OF VISITS WITH YOU"

## 2018-11-12 ENCOUNTER — Telehealth: Payer: BC Managed Care – PPO | Admitting: Cardiology
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
## 2018-12-08 ENCOUNTER — Ambulatory Visit (HOSPITAL_COMMUNITY): Payer: BC Managed Care – PPO | Admitting: Psychiatry

## 2018-12-08 ENCOUNTER — Other Ambulatory Visit: Payer: Self-pay

## 2018-12-12 ENCOUNTER — Encounter: Payer: Self-pay | Admitting: Psychiatry

## 2018-12-12 ENCOUNTER — Ambulatory Visit (INDEPENDENT_AMBULATORY_CARE_PROVIDER_SITE_OTHER): Payer: BC Managed Care – PPO | Admitting: Psychiatry

## 2018-12-12 ENCOUNTER — Other Ambulatory Visit: Payer: Self-pay

## 2018-12-12 DIAGNOSIS — F422 Mixed obsessional thoughts and acts: Secondary | ICD-10-CM | POA: Diagnosis not present

## 2018-12-12 DIAGNOSIS — F410 Panic disorder [episodic paroxysmal anxiety] without agoraphobia: Secondary | ICD-10-CM | POA: Diagnosis not present

## 2018-12-12 DIAGNOSIS — F321 Major depressive disorder, single episode, moderate: Secondary | ICD-10-CM | POA: Insufficient documentation

## 2018-12-12 DIAGNOSIS — F3341 Major depressive disorder, recurrent, in partial remission: Secondary | ICD-10-CM | POA: Diagnosis not present

## 2018-12-12 MED ORDER — CLONAZEPAM 0.5 MG PO TABS: 0.5000 mg | ORAL_TABLET | Freq: Every day | ORAL | 2 refills | Status: DC | PRN

## 2018-12-12 MED ORDER — SERTRALINE HCL 100 MG PO TABS: 150.0000 mg | ORAL_TABLET | Freq: Every day | ORAL | 1 refills | Status: DC

## 2018-12-12 NOTE — Progress Notes (Signed)
Brooke [name] OP Progress Note  I connected with  Brooke [name] on 12/12/18 by a video enabled telemedicine application and verified that I am speaking with the correct person using two identifiers.   I discussed the limitations of evaluation and management by telemedicine. The patient expressed understanding and agreed to proceed.    12/12/2018 8:39 AM Brooke [name]  MRN:  [id number]  Chief Complaint:  " The anxiety gets the best of me."  HPI: Patient reported that she is not feeling too depressed now but her anxiety at times can be overwhelming.  She has episodes when her heart starts racing fast and she feels she cannot catch her next breath.  She has 5-6 of such episodes per week.  She feels overwhelmed by this anxiety.  She does not feel hydroxyzine is helping her much except for bringing her to sleep.  She has been sleeping fairly well at night.  She denied any excessive consumption of alcohol.  She denied use of illicit drugs. PDMP reviewed during this encounter.  Visit Diagnosis:    ICD-10-CM   1. MDD (major depressive disorder), recurrent, in partial remission ([place])  F33.41   2. Mixed obsessional thoughts and acts  F42.2   3. Panic attacks  F41.0     Past Psychiatric History: Depression, anxiety  Past Medical History:  Past Medical History:  Diagnosis Date  . Acid reflux   . Anxiety   . Cervical radiculitis   . Chronic constipation   . DDD (degenerative disc disease), lumbar   . Depression   . Dizzy spells   . Facial numbness   . Heart palpitations 01/2017  . History of hiatal hernia   . Hypertension   . Insomnia   . Iron deficiency anemia   . Migraines    occ  . Near syncope 01/2017  . Numbness and tingling in left arm   . Obesity   . Panic attacks   . Sciatica   . Seizures ([place])     Past Surgical History:  Procedure Laterality Date  . BUNIONECTOMY Left yrs ago  . CERVICAL ABLATION  2017  . COLONOSCOPY, ESOPHAGOGASTRODUODENOSCOPY (EGD) AND ESOPHAGEAL  DILATION N/A 12/03/2012   [id number] melanosis throughout the entire examined colon/The colon IS redundant/Small internal hemorrhoids/EGD:Esophageal web/Medium sized hiatal hernia/MILD Non-erosive gastritis  . ESOPHAGOGASTRODUODENOSCOPY  03/09/09   Dr. Wilford Corner, normal EGD, s/p Bravo capsule placement  . GASTRIC ROUX-EN-Y N/A 07/16/2017   Procedure: LAPAROSCOPIC ROUX-EN-Y GASTRIC BYPASS WITH UPPER ENDOSCOPY AND ERAS PATHWAY;  Surgeon: Johnathan Hausen, [name];  Location: [facility];  Service: General;  Laterality: N/A;  . LAPAROSCOPY N/A 07/20/2017   Procedure: LAPAROSCOPY DIAGNOSTIC. REDUCTION OF SMALL BOWEL OBSTRUCTION. REPAIR OF TROCAR HERNIA.;  Surgeon: Alphonsa Overall, [name];  Location: [facility];  Service: General;  Laterality: N/A;  . TUBAL LIGATION    . WISDOM TOOTH EXTRACTION      Family Psychiatric History: see below   Family History:  Family History  Problem Relation Age of Onset  . Diabetes Mother   . Hypertension Mother   . Drug abuse Mother   . Anxiety disorder Mother   . Depression Mother   . CAD Mother        CABG in 64s  . Hypertension Father   . Diabetes Sister   . Hypertension Sister   . Hypertension Brother   . Drug abuse Brother   . CAD Brother        s/p CABG in 88s  . Diabetes Paternal  Grandmother   . Hypertension Brother   . Drug abuse Brother   . CAD Brother        "HEart artery blockages" in 67s  . Hypertension Brother   . Drug abuse Brother   . Cancer Maternal Grandmother        breast  . Anxiety disorder Maternal Grandmother   . Depression Maternal Grandmother   . Asthma Other   . Heart disease Other   . Colon cancer Neg Hx     Social History:  Social History   Socioeconomic History  . Marital status: Married    Spouse name: Not on file  . Number of children: 2  . Years of education: Not on file  . Highest education level: Not on file  Occupational History  . Occupation: [employer]    Employer: [employer]  Tobacco  Use  . Smoking status: Former Smoker    Packs/day: 0.30    Years: 23.00    Pack years: 6.90    Types: Cigarettes    Quit date: 10/04/2012    Years since quitting: 6.1  . Smokeless tobacco: Never Used  . Tobacco comment: less than 1/2 pack cigarettes daily  Substance and Sexual Activity  . Alcohol use: Yes    Comment: once every 2-3 months  . Drug use: No  . Sexual activity: Yes    Partners: Male    Birth control/protection: Surgical  Other Topics Concern  . Not on file  Social History Narrative   Lives with husband and daughter in a one story home      Right handed      Highest level of edu- some college   Social Determinants of Health   Financial Resource Strain:   . Difficulty of Paying Living Expenses: Not on file  Food Insecurity:   . Worried About Charity fundraiser in the Last Year: Not on file  . Ran Out of Food in the Last Year: Not on file  Transportation Needs:   . Lack of Transportation (Medical): Not on file  . Lack of Transportation (Non-Medical): Not on file  Physical Activity:   . Days of Exercise per Week: Not on file  . Minutes of Exercise per Session: Not on file  Stress:   . Feeling of Stress : Not on file  Social Connections:   . Frequency of Communication with Friends and Family: Not on file  . Frequency of Social Gatherings with Friends and Family: Not on file  . Attends Religious Services: Not on file  . Active Member of Clubs or Organizations: Not on file  . Attends Archivist Meetings: Not on file  . Marital Status: Not on file    Allergies: No Known Allergies  Metabolic Disorder Labs: No results found for: HGBA1C, MPG No results found for: PROLACTIN Lab Results  Component Value Date   CHOL 184 05/13/2012   TRIG 67 05/13/2012   HDL 47 05/13/2012   CHOLHDL 3.9 05/13/2012   VLDL 13 05/13/2012   LDLCALC 124 (H) 05/13/2012   Lab Results  Component Value Date   TSH 0.823 05/13/2012    Therapeutic Level Labs: No  results found for: LITHIUM No results found for: VALPROATE No components found for:  CBMZ  Current Medications: Current Outpatient Medications  Medication Sig Dispense Refill  . acetaminophen (TYLENOL) 500 MG tablet Take 1,000 mg by mouth daily as needed for moderate pain or headache.    . Biotin 10000 MCG TABS  Take 10,000 mcg by mouth daily.    Marland Kitchen CALCIUM PO Take 1 capsule by mouth daily.    . Cholecalciferol (VITAMIN D) 2000 units CAPS Take 2,000 Units by mouth daily.    . ferrous sulfate 325 (65 FE) MG EC tablet Take 325 mg by mouth daily with breakfast.    . hydrOXYzine (ATARAX/VISTARIL) 25 MG tablet Take 1 tablet (25 mg total) by mouth 2 (two) times daily as needed for anxiety. 60 tablet 1  . levETIRAcetam (KEPPRA XR) 500 MG 24 hr tablet Take 1 tablet (500 mg total) by mouth See admin instructions. Take 500mg  twice daily 60 tablet   . meclizine (ANTIVERT) 25 MG tablet Take 25 mg by mouth 3 (three) times daily as needed for dizziness or nausea.     . pantoprazole (PROTONIX) 40 MG tablet Take 40 mg by mouth daily.    Marland Kitchen PREMPRO 0.45-1.5 MG tablet Take 1 tablet by mouth daily.     . sertraline (ZOLOFT) 100 MG tablet Take 1.5 tablets (150 mg total) by mouth daily. (Patient taking differently: Take 150 mg by mouth at bedtime. ) 135 tablet 0  . topiramate (TOPAMAX) 100 MG tablet Take 100 mg by mouth 2 (two) times daily.      No current facility-administered medications for this visit.    Musculoskeletal: Strength & Muscle Tone: unable to assess due to telemed visit Gait & Station: unable to assess due to telemed visit Patient leans: unable to assess due to telemed visit  Psychiatric Specialty Exam: Review of Systems  There were no vitals taken for this visit.There is no height or weight on file to calculate BMI.  General Appearance: Well Groomed  Eye Contact:  Good  Speech:  Clear and Coherent  Volume:  Normal  Mood:  Anxious  Affect:  Congruent  Thought Process:  Goal Directed,  Linear and Descriptions of Associations: Intact  Orientation:  Full (Time, Place, and Person)  Thought Content: Logical   Suicidal Thoughts:  No  Homicidal Thoughts:  No  Memory:  Recent;   Good Remote;   Good  Judgement:  Fair  Insight:  Fair  Psychomotor Activity:  Normal  Concentration:  Concentration: Good and Attention Span: Good  Recall:  Good  Fund of Knowledge: Good  Language: Good  Akathisia:  Negative  Handed:  Right  AIMS (if indicated): not done  Assets:  Communication Skills Desire for Improvement Financial Resources/Insurance Housing Social Support Talents/Skills  ADL's:  Intact  Cognition: WNL  Sleep:  Good   Screenings: PHQ2-9     Nutrition from 05/29/2017 in [facility]  PHQ-2 Total Score  0       Assessment and Plan: Patient reported having episodes of anxiety and panic attacks.  There have been impacting her life.  She does not feel hydroxyzine is helpful.  Was offered as needed Klonopin to target the anxiety and panic attacks as needed. Potential side effects of medication and risks vs benefits of treatment vs non-treatment were explained and discussed. All questions were answered.  1. MDD (major depressive disorder), recurrent, in partial remission ([facility])  - sertraline (ZOLOFT) 100 MG tablet; Take 1.5 tablets (150 mg total) by mouth daily.  Dispense: 135 tablet; Refill: 1  2. Mixed obsessional thoughts and acts  - sertraline (ZOLOFT) 100 MG tablet; Take 1.5 tablets (150 mg total) by mouth daily.  Dispense: 135 tablet; Refill: 1  3. Panic attacks  - sertraline (ZOLOFT) 100 MG tablet; Take 1.5 tablets (150  mg total) by mouth daily.  Dispense: 135 tablet; Refill: 1 - Start clonazePAM (KLONOPIN) 0.5 MG tablet; Take 1 tablet (0.5 mg total) by mouth daily as needed for anxiety.  Dispense: 30 tablet; Refill: 2 - Discontinue Hydroxyzine due to ineffectiveness and excessive drowsiness.  F/up in 3 months with [name]. Pt  was advised to call back sooner if her symptoms fail to improve or worsen.  Nevada Crane, [name] 12/12/2018, 8:39 AM

## 2018-12-22 ENCOUNTER — Other Ambulatory Visit: Payer: Self-pay | Admitting: Neurology

## 2019-01-22 ENCOUNTER — Other Ambulatory Visit: Payer: Self-pay

## 2019-01-22 ENCOUNTER — Ambulatory Visit (INDEPENDENT_AMBULATORY_CARE_PROVIDER_SITE_OTHER): Payer: 59

## 2019-01-22 DIAGNOSIS — R079 Chest pain, unspecified: Secondary | ICD-10-CM
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
## 2019-01-23 ENCOUNTER — Encounter: Payer: Self-pay | Admitting: Cardiology

## 2019-01-23 ENCOUNTER — Ambulatory Visit (INDEPENDENT_AMBULATORY_CARE_PROVIDER_SITE_OTHER): Payer: 59 | Admitting: Cardiology

## 2019-01-23 VITALS — BP 123/79 | HR 65 | Temp 98.1°F | Resp 16 | Ht 62.0 in | Wt 199.0 lb

## 2019-01-23 DIAGNOSIS — R072 Precordial pain: Secondary | ICD-10-CM

## 2019-01-23 DIAGNOSIS — E783 Hyperchylomicronemia: Secondary | ICD-10-CM | POA: Insufficient documentation

## 2019-01-23 DIAGNOSIS — E782 Mixed hyperlipidemia: Secondary | ICD-10-CM

## 2019-01-23 MED ORDER — METOPROLOL TARTRATE 25 MG PO TABS: 25.0000 mg | ORAL_TABLET | Freq: Two times a day (BID) | ORAL | 0 refills | Status: DC

## 2019-01-23 NOTE — Patient Instructions (Signed)
Your cardiac CT will be scheduled at the locations below:   [facility]  [address]  [place], [place] [address]  [phone]    If scheduled at [facility], please arrive at the [facility] main entrance of [facility] 30-45 minutes prior to test start time.  Proceed to the [facility] Radiology Department (first floor) to check-in and test prep.   Please follow these instructions carefully (unless otherwise directed):   On the Night Before the Test:   Be sure to Drink plenty of water.   Do not consume any caffeinated/decaffeinated beverages or chocolate 12 hours prior to your test.   Do not take any antihistamines 12 hours prior to your test.   If the patient has contrast allergy:  Patient will need a prescription for Prednisone and very clear instructions (as follows):  1. Prednisone 50 mg - take 13 hours prior to test  2. Take another Prednisone 50 mg 7 hours prior to test  3. Take another Prednisone 50 mg 1 hour prior to test  4. Take Benadryl 50 mg 1 hour prior to test   Patient must complete all four doses of above prophylactic medications.   Patient will need a ride after test due to Benadryl.   On the Day of the Test:   Drink plenty of water. Do not drink any water within one hour of the test.   Do not eat any food 4 hours prior to the test.   You may take your regular medications prior to the test.   - Metoprolol tartarate   Start 2 days before CT scan.    Take a dose 2 hour before the CT scan   Take the remaining pills with you.   You may stop it after the CT scan, unless specified otherwise by me.    FEMALES- please wear underwire-free bra if available          After the Test:   Drink plenty of water.   After receiving IV contrast, you may experience a mild flushed feeling. This is normal.   On occasion, you may experience a mild rash up to 24 hours after the test. This is not dangerous. If this occurs, you can take  Benadryl 25 mg and increase your fluid intake.   If you experience trouble breathing, this can be serious. If it is severe call 911 IMMEDIATELY. If it is mild, please call our office.   If you take any of these medications: Glipizide/Metformin, Avandament, Glucavance, please do not take 48 hours after completing test unless otherwise instructed.     Please contact the cardiac imaging nurse navigator should you have any questions/concerns  Marchia Bond, RN Navigator Cardiac Imaging  [facility]  [phone] Office  [phone] Cell

## 2019-01-26 ENCOUNTER — Encounter (HOSPITAL_COMMUNITY): Payer: Self-pay

## 2019-01-28 ENCOUNTER — Telehealth: Payer: Self-pay

## 2019-02-05 ENCOUNTER — Other Ambulatory Visit: Payer: Self-pay

## 2019-02-05 ENCOUNTER — Ambulatory Visit: Payer: 59 | Attending: Internal Medicine

## 2019-02-05 ENCOUNTER — Ambulatory Visit (INDEPENDENT_AMBULATORY_CARE_PROVIDER_SITE_OTHER): Payer: Self-pay | Admitting: Family Medicine

## 2019-02-05 ENCOUNTER — Encounter: Payer: Self-pay | Admitting: Family Medicine

## 2019-02-05 VITALS — BP 114/74 | HR 68 | Temp 97.2°F | Resp 15 | Ht 62.0 in | Wt 204.0 lb

## 2019-02-05 DIAGNOSIS — R072 Precordial pain: Secondary | ICD-10-CM

## 2019-02-05 DIAGNOSIS — Z9884 Bariatric surgery status: Secondary | ICD-10-CM

## 2019-02-05 DIAGNOSIS — Z20822 Contact with and (suspected) exposure to covid-19: Secondary | ICD-10-CM

## 2019-02-05 DIAGNOSIS — Z124 Encounter for screening for malignant neoplasm of cervix: Secondary | ICD-10-CM

## 2019-02-05 DIAGNOSIS — R42 Dizziness and giddiness: Secondary | ICD-10-CM

## 2019-02-05 DIAGNOSIS — Z0000 Encounter for general adult medical examination without abnormal findings: Secondary | ICD-10-CM | POA: Insufficient documentation

## 2019-02-05 DIAGNOSIS — F410 Panic disorder [episodic paroxysmal anxiety] without agoraphobia: Secondary | ICD-10-CM

## 2019-02-05 DIAGNOSIS — Z7189 Other specified counseling: Secondary | ICD-10-CM | POA: Insufficient documentation

## 2019-02-05 DIAGNOSIS — F321 Major depressive disorder, single episode, moderate: Secondary | ICD-10-CM

## 2019-02-05 DIAGNOSIS — G40909 Epilepsy, unspecified, not intractable, without status epilepticus: Secondary | ICD-10-CM

## 2019-02-05 DIAGNOSIS — D508 Other iron deficiency anemias: Secondary | ICD-10-CM

## 2019-02-05 MED ORDER — TETANUS-DIPHTH-ACELL PERTUSSIS 5-2.5-18.5 LF-MCG/0.5 IM SUSP: 0.5000 mL | Freq: Once | INTRAMUSCULAR | Status: DC

## 2019-02-05 NOTE — Assessment & Plan Note (Signed)
Will be having cardiac CT.  Is followed by cardiology now.

## 2019-02-05 NOTE — Assessment & Plan Note (Signed)
Has been using Klonopin reports that she rarely takes it.  Is been on Zoloft but does not think that is very helpful and is open to getting change.  Is being seen by [name] has an appointment coming up in March.

## 2019-02-05 NOTE — Assessment & Plan Note (Signed)
PHQ 13 denies having SI and HI.  Reports that she is willing to adjust off her Zoloft and maybe start something else.  Does have a little bit of obsessive-compulsive disorder.  Possible use of Lexapro and trazodone for bed might help.  Advised for her to continue to talk to the therapist and see if there is any recommendations on medicine.  If she feels like the Zoloft was helping her after she stopped them happy to put her back on that.  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

## 2019-02-05 NOTE — Assessment & Plan Note (Signed)
Reports that she uses meclizine for this and it does help her.  She does not use it a lot but when she needs it she does report that it helps and she has had a history of diagnosis of vertigo.

## 2019-02-05 NOTE — Assessment & Plan Note (Signed)
Is followed by [facility].  Reports her last seizure was last week.  Is currently in the 6 monthly for no driving.  Unsure what caused the seizure possible uncontrolled depression anxiety.  Will be looking at adjusting her medications.

## 2019-02-05 NOTE — Patient Instructions (Signed)
Happy New Year! May you have a year filled with hope, love, happiness and laughter.  I appreciate the opportunity to provide you with care for your health and wellness. Today we discussed: establish care   Follow up: 4 weeks   Labs today  Wean off Zoloft: Take 150 mg today, Tomorrow and the next 3 days take 100mg , Then Take 50mg  for 3 days, and 25 mg for 3 days. Then 25 mg every other day for 3 days. Then stop.  Message or call if you have any issues or trouble with this.  Please continue to practice social distancing to keep you, your family, and our community safe.  If you must go out, please wear a mask and practice good handwashing.  It was a pleasure to see you and I look forward to continuing to work together on your health and well-being. Please do not hesitate to call the office if you need care or have questions about your care.  Have a wonderful day and week. With Gratitude, Cherly Beach, DNP, AGNP-BC

## 2019-02-05 NOTE — Assessment & Plan Note (Signed)
History of iron deficiency anemia will be checking labs today.  She reports that she feels lethargic and down.
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
## 2019-02-05 NOTE — Assessment & Plan Note (Signed)
Doing well reports taking her bariatric vitamins but need to get check on her labs to make sure that this is not for her.

## 2019-02-05 NOTE — Assessment & Plan Note (Signed)
Needs pap

## 2019-02-05 NOTE — Assessment & Plan Note (Signed)
Deteriorated is status post bariatric surgery educated on proper diet.  She reports that she had been down under 200 as demonstrated in weight readings but recently put back on weight.  Has not been able to go to the gym since pandemic BRUCIE ROMANOSKI is re-educated about the importance of exercise daily to help with weight management. A minumum of 30 minutes daily is recommended. Additionally, importance of healthy food choices  with portion control discussed.   Wt Readings from Last 3 Encounters:  02/05/19 204 lb (92.5 kg)  01/23/19 199 lb (90.3 kg)  10/13/18 193 lb 15.4 oz (88 kg)

## 2019-02-05 NOTE — Assessment & Plan Note (Signed)
Has questionable signs and symptoms today that include chills for last 2 weeks, headache, dizziness, nausea could be related to vertigo which she reports she has after ongoing conversation in addition to uncontrolled depression and anxiety but secondary to the nature of the pandemic I have suggested that she does get tested for Covid and she will be setting that up piercing.

## 2019-02-06 LAB — NOVEL CORONAVIRUS, NAA: SARS-CoV-2, NAA: NOT DETECTED

## 2019-02-12 ENCOUNTER — Encounter: Payer: Self-pay | Admitting: Family Medicine

## 2019-02-12 ENCOUNTER — Other Ambulatory Visit: Payer: Self-pay | Admitting: Family Medicine

## 2019-02-12 DIAGNOSIS — F321 Major depressive disorder, single episode, moderate: Secondary | ICD-10-CM

## 2019-02-12 MED ORDER — ESCITALOPRAM OXALATE 10 MG PO TABS: 5.0000 mg | ORAL_TABLET | Freq: Every day | ORAL | 1 refills | Status: DC

## 2019-02-12 MED ORDER — MELATONIN 3 MG PO TABS: 3.0000 mg | ORAL_TABLET | Freq: Every evening | ORAL | 3 refills | Status: DC | PRN

## 2019-02-17 ENCOUNTER — Encounter (HOSPITAL_COMMUNITY): Payer: Self-pay

## 2019-02-17 LAB — COMPLETE METABOLIC PANEL WITH GFR
AG Ratio: 1.6 (calc) (ref 1.0–2.5)
ALT: 39 U/L — ABNORMAL HIGH (ref 6–29)
AST: 29 U/L (ref 10–35)
Albumin: 3.9 g/dL (ref 3.6–5.1)
Alkaline phosphatase (APISO): 74 U/L (ref 31–125)
BUN: 12 mg/dL (ref 7–25)
CO2: 24 mmol/L (ref 20–32)
Calcium: 8.8 mg/dL (ref 8.6–10.2)
Chloride: 110 mmol/L (ref 98–110)
Creat: 0.83 mg/dL (ref 0.50–1.10)
GFR, Est African American: 97 mL/min/{1.73_m2} (ref >=60)
GFR, Est Non African American: 83 mL/min/{1.73_m2} (ref >=60)
Globulin: 2.5 g/dL (calc) (ref 1.9–3.7)
Glucose, Bld: 90 mg/dL (ref 65–99)
Potassium: 4.3 mmol/L (ref 3.5–5.3)
Sodium: 141 mmol/L (ref 135–146)
Total Bilirubin: 0.3 mg/dL (ref 0.2–1.2)
Total Protein: 6.4 g/dL (ref 6.1–8.1)

## 2019-02-17 LAB — LIPID PANEL
Cholesterol: 222 mg/dL — ABNORMAL HIGH (ref <200)
HDL: 58 mg/dL (ref >=50)
LDL Cholesterol (Calc): 144 mg/dL (calc) — ABNORMAL HIGH
Non-HDL Cholesterol (Calc): 164 mg/dL (calc) — ABNORMAL HIGH (ref <130)
Total CHOL/HDL Ratio: 3.8 (calc) (ref <5.0)
Triglycerides: 94 mg/dL (ref <150)

## 2019-02-17 LAB — CBC
HCT: 40.3 % (ref 35.0–45.0)
Hemoglobin: 13.4 g/dL (ref 11.7–15.5)
MCH: 30.6 pg (ref 27.0–33.0)
MCHC: 33.3 g/dL (ref 32.0–36.0)
MCV: 92 fL (ref 80.0–100.0)
MPV: 9.7 fL (ref 7.5–12.5)
Platelets: 319 10*3/uL (ref 140–400)
RBC: 4.38 10*6/uL (ref 3.80–5.10)
RDW: 13.2 % (ref 11.0–15.0)
WBC: 4.1 10*3/uL (ref 3.8–10.8)

## 2019-02-17 LAB — VITAMIN B12: Vitamin B-12: 1216 pg/mL — ABNORMAL HIGH (ref 200–1100)

## 2019-02-17 LAB — VITAMIN D 25 HYDROXY (VIT D DEFICIENCY, FRACTURES): Vit D, 25-Hydroxy: 29 ng/mL — ABNORMAL LOW (ref 30–100)

## 2019-02-17 LAB — TSH: TSH: 1.48 mIU/L

## 2019-02-18 ENCOUNTER — Telehealth: Payer: Self-pay

## 2019-02-18 ENCOUNTER — Encounter: Payer: Self-pay | Admitting: Family Medicine

## 2019-02-18 ENCOUNTER — Telehealth (HOSPITAL_COMMUNITY): Payer: Self-pay | Admitting: Emergency Medicine

## 2019-02-18 DIAGNOSIS — I10 Essential (primary) hypertension: Secondary | ICD-10-CM

## 2019-02-18 DIAGNOSIS — R072 Precordial pain: Secondary | ICD-10-CM

## 2019-02-18 MED ORDER — METOPROLOL TARTRATE 25 MG PO TABS: 25.0000 mg | ORAL_TABLET | Freq: Two times a day (BID) | ORAL | 0 refills | Status: DC

## 2019-02-18 MED ORDER — METOPROLOL TARTRATE 25 MG PO TABS: ORAL_TABLET | ORAL | 0 refills | Status: DC

## 2019-02-18 NOTE — Telephone Encounter (Addendum)
Reaching out to patient to offer assistance regarding upcoming cardiac imaging study; pt verbalizes understanding of appt date/time, parking situation and where to check in, pre-test NPO status and medications ordered, and verified current allergies; name and call back number provided for further questions should they arise Marchia Bond RN [facility] [phone] office [phone] cell  Pt reporting that the rx for metoprolol has not been submitted to her pharm. Confirmed it was the [facility] on [address], - will submit this rx now

## 2019-02-18 NOTE — Telephone Encounter (Signed)
Yes

## 2019-02-18 NOTE — Telephone Encounter (Signed)
Is it ok for patient to start her Metoprolol today? She has her cardiac CT scan on 02/18/18 at 10:00. Phone [phone]

## 2019-02-18 NOTE — Telephone Encounter (Signed)
error 

## 2019-02-18 NOTE — Telephone Encounter (Signed)
LMOVM  instruction on Metoprolol

## 2019-02-19 ENCOUNTER — Ambulatory Visit: Admission: RE | Admit: 2019-02-19 | Payer: 59 | Source: Ambulatory Visit

## 2019-02-20 ENCOUNTER — Ambulatory Visit (HOSPITAL_COMMUNITY)
Admission: RE | Admit: 2019-02-20 | Discharge: 2019-02-20 | Disposition: A | Discharge status: Home / Self Care | Payer: 59 | Source: Ambulatory Visit | Attending: Cardiology | Admitting: Cardiology

## 2019-02-20 ENCOUNTER — Other Ambulatory Visit: Payer: Self-pay

## 2019-02-20 DIAGNOSIS — R072 Precordial pain: Secondary | ICD-10-CM | POA: Diagnosis present

## 2019-02-20 MED ORDER — NITROGLYCERIN 0.4 MG SL SUBL
SUBLINGUAL_TABLET | SUBLINGUAL | Status: AC
  Filled 2019-02-20: qty 2

## 2019-02-20 MED ORDER — IOHEXOL 350 MG/ML SOLN
80.0000 mL | Freq: Once | INTRAVENOUS | Status: AC | PRN
  Administered 2019-02-20: 08:00:00 80 mL via INTRAVENOUS

## 2019-02-20 MED ORDER — NITROGLYCERIN 0.4 MG SL SUBL
0.8000 mg | SUBLINGUAL_TABLET | Freq: Once | SUBLINGUAL | Status: AC
  Administered 2019-02-20: 08:00:00 0.8 mg via SUBLINGUAL

## 2019-02-25 ENCOUNTER — Other Ambulatory Visit: Payer: Self-pay | Admitting: Family Medicine

## 2019-02-25 ENCOUNTER — Encounter: Payer: Self-pay | Admitting: Family Medicine

## 2019-02-25 DIAGNOSIS — F321 Major depressive disorder, single episode, moderate: Secondary | ICD-10-CM

## 2019-02-25 MED ORDER — ESCITALOPRAM OXALATE 20 MG PO TABS: 20.0000 mg | ORAL_TABLET | Freq: Every day | ORAL | 1 refills | Status: DC

## 2019-03-05 ENCOUNTER — Telehealth (INDEPENDENT_AMBULATORY_CARE_PROVIDER_SITE_OTHER): Payer: 59 | Admitting: Family Medicine

## 2019-03-05 ENCOUNTER — Encounter: Payer: Self-pay | Admitting: Women's Health

## 2019-03-05 ENCOUNTER — Ambulatory Visit (INDEPENDENT_AMBULATORY_CARE_PROVIDER_SITE_OTHER): Payer: 59 | Admitting: Women's Health

## 2019-03-05 ENCOUNTER — Other Ambulatory Visit: Payer: Self-pay

## 2019-03-05 ENCOUNTER — Other Ambulatory Visit (HOSPITAL_COMMUNITY)
Admission: RE | Admit: 2019-03-05 | Discharge: 2019-03-05 | Disposition: A | Discharge status: Home / Self Care | Payer: 59 | Source: Ambulatory Visit | Attending: Obstetrics & Gynecology | Admitting: Obstetrics & Gynecology

## 2019-03-05 ENCOUNTER — Encounter: Payer: Self-pay | Admitting: Family Medicine

## 2019-03-05 ENCOUNTER — Other Ambulatory Visit: Payer: Self-pay | Admitting: Women's Health

## 2019-03-05 VITALS — BP 119/80 | HR 61 | Ht 64.0 in | Wt 203.4 lb

## 2019-03-05 DIAGNOSIS — Z01419 Encounter for gynecological examination (general) (routine) without abnormal findings: Secondary | ICD-10-CM | POA: Insufficient documentation

## 2019-03-05 DIAGNOSIS — Z72820 Sleep deprivation: Secondary | ICD-10-CM

## 2019-03-05 DIAGNOSIS — N6320 Unspecified lump in the left breast, unspecified quadrant: Secondary | ICD-10-CM

## 2019-03-05 DIAGNOSIS — N6452 Nipple discharge: Secondary | ICD-10-CM

## 2019-03-05 DIAGNOSIS — F331 Major depressive disorder, recurrent, moderate: Secondary | ICD-10-CM

## 2019-03-05 DIAGNOSIS — F321 Major depressive disorder, single episode, moderate: Secondary | ICD-10-CM | POA: Diagnosis not present

## 2019-03-05 DIAGNOSIS — N644 Mastodynia: Secondary | ICD-10-CM

## 2019-03-05 DIAGNOSIS — N939 Abnormal uterine and vaginal bleeding, unspecified: Secondary | ICD-10-CM

## 2019-03-05 DIAGNOSIS — N852 Hypertrophy of uterus: Secondary | ICD-10-CM

## 2019-03-05 MED ORDER — HYDROXYZINE HCL 10 MG PO TABS: 10.0000 mg | ORAL_TABLET | Freq: Every evening | ORAL | 0 refills | Status: DC | PRN

## 2019-03-05 NOTE — Patient Instructions (Signed)
Breast ultrasound and mammogram Wed 3/10 @ 9:50am at the [facility], be there at 9:30am, no lotion/deoderant/powder/perfume that day

## 2019-03-05 NOTE — Progress Notes (Signed)
Virtual Visit via Video Note  I connected with Brooke [name] on 03/12/19 at  9:00 AM EST by a video enabled telemedicine application and verified that I am speaking with the correct person using two identifiers.   I discussed the limitations of evaluation and management by telemedicine and the availability of in person appointments. The patient expressed understanding and agreed to proceed.   I discussed the assessment and treatment plan with the patient. The patient was provided an opportunity to ask questions and all were answered. The patient agreed with the plan and demonstrated an understanding of the instructions.   The patient was advised to call back or seek an in-person evaluation if the symptoms worsen or if the condition fails to improve as anticipated.  I provided 15 minutes of non-face-to-face time during this encounter.   [name], MD    [facility] MD/PA/NP OP Progress Note  03/12/2019 9:20 AM EDMEE [name]  MRN:  [id number]  Chief Complaint:  Chief Complaint    Depression; Follow-up     HPI:  - clonazepam was started by Dr. Toy Care  She states that she has worsening depression last month.  Her mother was diagnosed with lung cancer, and she took care of her mother, visiting her every day.  Although her mother used to be outgoing, is different now that she carries oxygen. Her mother lives with her stepfather.  She visits her mother twice a week, and she is doing better.  She had SI of "not want to be here."  She saw her PCP, and sertraline was switched to Lexapro (the dose was uptitrated to 20 mg about a week ago according to the patient). She feels much better, although she occasionally has fleeting passive SI.  She adamantly denies any plan or intent.  She believes she was overwhelmed as she needed to do things for everybody. She "need to get use to it" "start all over again,"taking care of her cousin, referring to her children who are all grown. She reports good  relationship with her cousin. Her husband has been supportive.  She has occasional insomnia.  She has fair energy and motivation.  She has fair concentration.  She has good appetite.  She feels less anxious/irritable.  She has panic attacks twice a week especially when her cousin/grandchildren are "messy" while she tries to clean up things. She has obsession of cleaning sink and carpet. She feels easily irritated when there are fingerprint. She repeatedly asks them not to do certain things so that things are clean. She denies counting. She rarely takes clonazepam/hydroxyzine.   Routine- goes to [facility] a couple times per week, takes care of her grandchildren/cousin, does house chores, may go outside at times.    Visit Diagnosis:    ICD-10-CM   1. MDD (major depressive disorder), recurrent episode, mild ([place])  F33.0   2. Mixed obsessional thoughts and acts  F42.2   3. Panic attacks  F41.0     Past Psychiatric History: Please see initial evaluation for full details. I have reviewed the history. No updates at this time.     Past Medical History:  Past Medical History:  Diagnosis Date  . Acid reflux   . Anxiety   . Cervical radiculitis   . Chronic constipation   . DDD (degenerative disc disease), lumbar   . Depression   . Dizzy spells   . Facial numbness   . Heart palpitations 01/2017  . History of hiatal hernia   . Hypertension   .  Insomnia   . Iron deficiency anemia   . LUQ pain 11/19/2012  . Migraines    occ  . Near syncope 01/2017  . Numbness and tingling in left arm   . Obesity   . Panic attacks   . Sciatica   . Seizures ([place])   . Small bowel obstruction ([place]) 07/20/2017    Past Surgical History:  Procedure Laterality Date  . BUNIONECTOMY Left yrs ago  . CERVICAL ABLATION  2017  . COLONOSCOPY, ESOPHAGOGASTRODUODENOSCOPY (EGD) AND ESOPHAGEAL DILATION N/A 12/03/2012   [id number] melanosis throughout the entire examined colon/The colon IS redundant/Small internal  hemorrhoids/EGD:Esophageal web/Medium sized hiatal hernia/MILD Non-erosive gastritis  . ESOPHAGOGASTRODUODENOSCOPY  03/09/09   Dr. Wilford Corner, normal EGD, s/p Bravo capsule placement  . GASTRIC ROUX-EN-Y N/A 07/16/2017   Procedure: LAPAROSCOPIC ROUX-EN-Y GASTRIC BYPASS WITH UPPER ENDOSCOPY AND ERAS PATHWAY;  Surgeon: Johnathan Hausen, MD;  Location: [facility];  Service: General;  Laterality: N/A;  . LAPAROSCOPY N/A 07/20/2017   Procedure: LAPAROSCOPY DIAGNOSTIC. REDUCTION OF SMALL BOWEL OBSTRUCTION. REPAIR OF TROCAR HERNIA.;  Surgeon: Alphonsa Overall, MD;  Location: [facility];  Service: General;  Laterality: N/A;  . TUBAL LIGATION    . WISDOM TOOTH EXTRACTION      Family Psychiatric History: Please see initial evaluation for full details. I have reviewed the history. No updates at this time.     Family History:  Family History  Problem Relation Age of Onset  . Diabetes Mother   . Hypertension Mother   . Drug abuse Mother   . Anxiety disorder Mother   . Depression Mother   . CAD Mother        CABG in 23s  . Lung cancer Mother   . Hypertension Father   . Diabetes Sister   . Hypertension Sister   . Hypertension Brother   . Drug abuse Brother   . CAD Brother        s/p CABG in 7s  . Diabetes Paternal Grandmother   . Hypertension Brother   . Drug abuse Brother   . CAD Brother        "HEart artery blockages" in 69s  . Hypertension Brother   . Drug abuse Brother   . Cancer Maternal Grandmother        breast  . Anxiety disorder Maternal Grandmother   . Depression Maternal Grandmother   . Asthma Other   . Heart disease Other   . Colon cancer Neg Hx     Social History:  Social History   Socioeconomic History  . Marital status: Married    Spouse name: Randall Hiss   . Number of children: 2  . Years of education: Not on file  . Highest education level: Some college, no degree  Occupational History  . Not on file  Tobacco Use  . Smoking status: Former Smoker    Packs/day: 0.30     Years: 23.00    Pack years: 6.90    Types: Cigarettes    Quit date: 10/04/2012    Years since quitting: 6.4  . Smokeless tobacco: Never Used  . Tobacco comment: less than 1/2 pack cigarettes daily  Substance and Sexual Activity  . Alcohol use: Yes    Comment: once every 2-3 months  . Drug use: No  . Sexual activity: Yes    Partners: Male    Birth control/protection: Surgical  Other Topics Concern  . Not on file  Social History Narrative   Lives with husband, married 67 years  56 son Teron    36 son Jiles Harold -two grandchildren    Live close by    Rising cousin-custody of her daughter 98 [address]       Right handed   Pets: none      Enjoys: [employer], shopping, likes being outside       Diet: eggs, oatmeal, salad, all food groups no lot of proteins, good on veggies.    Caffeine: sweet tea-2 cups  Coffee-1 cup daily    Water: 2-3 16 oz bottles daily       Wears seat belt    Smoke and carbon monoxide detectors   Does use phone while driving but hands free   Social Determinants of Health   Financial Resource Strain: Low Risk   . Difficulty of Paying Living Expenses: Not hard at all  Food Insecurity: No Food Insecurity  . Worried About Charity fundraiser in the Last Year: Never true  . Ran Out of Food in the Last Year: Never true  Transportation Needs: No Transportation Needs  . Lack of Transportation (Medical): No  . Lack of Transportation (Non-Medical): No  Physical Activity: Inactive  . Days of Exercise per Week: 0 days  . Minutes of Exercise per Session: 0 min  Stress: Stress Concern Present  . Feeling of Stress : To some extent  Social Connections: Slightly Isolated  . Frequency of Communication with Friends and Family: More than three times a week  . Frequency of Social Gatherings with Friends and Family: More than three times a week  . Attends Religious Services: More than 4 times per year  . Active Member of Clubs or Organizations: No  . Attends Theatre manager Meetings: Never  . Marital Status: Married    Allergies: No Known Allergies  Metabolic Disorder Labs: No results found for: HGBA1C, MPG No results found for: PROLACTIN Lab Results  Component Value Date   CHOL 222 (H) 02/17/2019   TRIG 94 02/17/2019   HDL 58 02/17/2019   CHOLHDL 3.8 02/17/2019   VLDL 13 05/13/2012   LDLCALC 144 (H) 02/17/2019   LDLCALC 124 (H) 05/13/2012   Lab Results  Component Value Date   TSH 1.48 02/17/2019   TSH 0.823 05/13/2012    Therapeutic Level Labs: No results found for: LITHIUM No results found for: VALPROATE No components found for:  CBMZ  Current Medications: Current Outpatient Medications  Medication Sig Dispense Refill  . acetaminophen (TYLENOL) 500 MG tablet Take 1,000 mg by mouth daily as needed for moderate pain or headache.    . Biotin 10000 MCG TABS Take 10,000 mcg by mouth daily.    Marland Kitchen CALCIUM PO Take 1 capsule by mouth daily.    . Cholecalciferol (VITAMIN D) 2000 units CAPS Take 2,000 Units by mouth daily.    . clonazePAM (KLONOPIN) 0.5 MG tablet Take 1 tablet (0.5 mg total) by mouth daily as needed for anxiety. 30 tablet 2  . escitalopram (LEXAPRO) 20 MG tablet Take 1 tablet (20 mg total) by mouth daily. 30 tablet 1  . ferrous sulfate 325 (65 FE) MG EC tablet Take 325 mg by mouth daily with breakfast.    . hydrOXYzine (ATARAX/VISTARIL) 10 MG tablet Take 1 tablet (10 mg total) by mouth at bedtime as needed. 30 tablet 0  . levETIRAcetam (KEPPRA XR) 500 MG 24 hr tablet Take 1 tablet (500 mg total) by mouth See admin instructions. Take 500mg  twice daily (Patient taking differently: Take 1,000 mg by mouth See  admin instructions. Take 1000mg  twice daily) 60 tablet   . MAGNESIUM PO Take 1 tablet by mouth daily.    . meclizine (ANTIVERT) 25 MG tablet Take 25 mg by mouth 3 (three) times daily as needed for dizziness or nausea.     . metoprolol tartrate (LOPRESSOR) 25 MG tablet Take 1 tablet (25 mg total) by mouth 2 (two) times  daily. Start 2 days before CT scan.  Take a dose 2 hour before the CT scan  Take the remaining pills with you.  You may stop it after the CT scan, unless specified otherwise by me. (Patient not taking: Reported on 03/05/2019) 10 tablet 0  . pantoprazole (PROTONIX) 40 MG tablet Take 40 mg by mouth daily.    Marland Kitchen topiramate (TOPAMAX) 100 MG tablet Take 100 mg by mouth 2 (two) times daily.      No current facility-administered medications for this visit.     Musculoskeletal: Strength & Muscle Tone: N.A Gait & Station: N/A Patient leans: N/A  Psychiatric Specialty Exam: Review of Systems  Psychiatric/Behavioral: Positive for sleep disturbance. Negative for agitation, behavioral problems, confusion, decreased concentration, dysphoric mood, hallucinations, self-injury and suicidal ideas. The patient is nervous/anxious. The patient is not hyperactive.   All other systems reviewed and are negative.   Last menstrual period 02/28/2019.There is no height or weight on file to calculate BMI.  General Appearance: Fairly Groomed  Eye Contact:  Good  Speech:  Clear and Coherent  Volume:  Normal  Mood:  "better"  Affect:  Appropriate, Congruent and calm, euthymic  Thought Process:  Coherent  Orientation:  Full (Time, Place, and Person)  Thought Content: Logical   Suicidal Thoughts:  Yes.  without intent/plan  Homicidal Thoughts:  No  Memory:  Immediate;   Good  Judgement:  Good  Insight:  Good  Psychomotor Activity:  Normal  Concentration:  Concentration: Good and Attention Span: Good  Recall:  Good  Fund of Knowledge: Good  Language: Good  Akathisia:  No  Handed:  Right  AIMS (if indicated): not done  Assets:  Communication Skills Desire for Improvement  ADL's:  Intact  Cognition: WNL  Sleep:  Fair   Screenings: PHQ2-9     Office Visit from 03/05/2019 in [place] Most recent reading at 03/05/2019 11:26 AM Video Visit from 03/05/2019 in [facility] Most recent reading  at 03/05/2019  9:23 AM Office Visit from 02/05/2019 in [facility] Most recent reading at 02/05/2019 10:09 AM Nutrition from 05/29/2017 [facility] Most recent reading at 05/29/2017  8:15 AM  PHQ-2 Total Score  5  1  1   0  PHQ-9 Total Score  18  12  13   --       Assessment and Plan:  Brooke [name] is a 49 y.o. year old female with a history of depression, spells of unresponsiveness, followed by neurology, migraine, hypertension, GERD, s/pRYGB 07/2017, mild obstructive sleep apnea , who presents for follow up appointment for MDD (major depressive disorder), recurrent episode, mild ([place])  Mixed obsessional thoughts and acts  Panic attacks  # MDD, mild, recurrent without psychotic features # OCD She has had relapse in depressive symptoms in the context of her mother being diagnosed with lung cancer.  Other psychosocial stressors includes taking care of her cousin at home, unemployment, demoralization due to pain/seizure, and her husband's medical condition.  Her mood symptoms has been improved since sertraline was switched to Lexapro by PCP.  Will continue current dose of Lexapro given it was recently uptitrated.  Will consider adjunctive treatment if any worsening in her mood symptoms.  Will continue clonazepam/hydroxyzine as needed for anxiety. She will greatly benefit from CBT; will make referral.  There has been overall improvement in anxiety and depressive symptoms since up titration of sertraline.  Psychosocial stressors includes unemployment, demoralization due to pain and seizure, and her husband's medical condition.  We will follow up titration of sertraline to target residual mood symptoms.  She will greatly benefit from CBT; she is encouraged to have follow-up with Ms. Bynum for therapy.    Plan 1. Continue lexapro 20 mg daily  2. Continue clonazepam 0.5 mg daily as needed for anxiety (refill from Dr. Toy Care) 3. Continue hydroxyzine 10 mg daily  as needed for anxiety  4 Referral to therapy (she hopes to transfer care) 5. Next appointment: 5/12 at 9:10 for 20 mins, video    I have reviewed suicide assessment in detail. No change in the following assessment.   The patient demonstrates the following risk factors for suicide: Chronic risk factors for suicide include:psychiatric disorder ofdepression, OCDand chronic pain. Acute risk factorsfor suicide include: unemployment. Protective factorsfor this patient include: positive social support, responsibility to others (children, family), coping skills and hope for the future. Considering these factors, the overall suicide risk at this point appears to below. Patientisappropriate for outpatient follow up.  [name], MD 03/12/2019, 9:20 AM

## 2019-03-05 NOTE — Progress Notes (Signed)
Virtual Visit via Telephone Note   This visit type was conducted due to national recommendations for restrictions regarding the COVID-19 Pandemic (e.g. social distancing) in an effort to limit this patient's exposure and mitigate transmission in our community.  Due to her co-morbid illnesses, this patient is at least at moderate risk for complications without adequate follow up.  This format is felt to be most appropriate for this patient at this time.  The patient did not have access to video technology/had technical difficulties with video requiring transitioning to audio format only (telephone).  All issues noted in this document were discussed and addressed.  No physical exam could be performed with this format.    Evaluation Performed:  Follow-up visit  Date:  03/05/2019   ID:  Brooke [name], DOB February 04, 1970, MRN [id number]  Patient Location: Home Provider Location: Office  Location of Patient: Home Location of Provider: Telehealth Consent was obtain for visit to be over via telehealth. I verified that I am speaking with the correct person using two identifiers.  PCP:  Perlie Mayo, NP   Chief Complaint:    History of Present Illness:    Brooke [name] is a 49 y.o. female with history that includes acid reflux, anxiety, depression, seizures, hypertension, insomnia among others.  Presents today for follow-up on her depression.  She reports that her Zoloft was not working for her at her visit to establish with me a month ago.  I weaned her off of the Zoloft and start her on Lexapro.  We ended up increasing Lexapro to 20 mg.  She reports that she is doing well with this and thinks that it is working much better than the Zoloft was.  She is also been trying to motivate herself to get out and work out and exercise and that helps some.  She still reports some trouble with depression but she really feels like she is getting better. Additionally needs something to help with sleep.  As  she reports that that makes things a lot worse for her.  The patient does not have symptoms concerning for COVID-19 infection (fever, chills, cough, or new shortness of breath).   Past Medical, Surgical, Social History, Allergies, and Medications have been Reviewed.  Past Medical History:  Diagnosis Date  . Acid reflux   . Anxiety   . Cervical radiculitis   . Chronic constipation   . DDD (degenerative disc disease), lumbar   . Depression   . Dizzy spells   . Facial numbness   . Heart palpitations 01/2017  . History of hiatal hernia   . Hypertension   . Insomnia   . Iron deficiency anemia   . LUQ pain 11/19/2012  . Migraines    occ  . Near syncope 01/2017  . Numbness and tingling in left arm   . Obesity   . Panic attacks   . Sciatica   . Seizures ([place])   . Small bowel obstruction ([place]) 07/20/2017   Past Surgical History:  Procedure Laterality Date  . BUNIONECTOMY Left yrs ago  . CERVICAL ABLATION  2017  . COLONOSCOPY, ESOPHAGOGASTRODUODENOSCOPY (EGD) AND ESOPHAGEAL DILATION N/A 12/03/2012   [id number] melanosis throughout the entire examined colon/The colon IS redundant/Small internal hemorrhoids/EGD:Esophageal web/Medium sized hiatal hernia/MILD Non-erosive gastritis  . ESOPHAGOGASTRODUODENOSCOPY  03/09/09   Dr. Wilford Corner, normal EGD, s/p Bravo capsule placement  . GASTRIC ROUX-EN-Y N/A 07/16/2017   Procedure: LAPAROSCOPIC ROUX-EN-Y GASTRIC BYPASS WITH UPPER ENDOSCOPY AND ERAS PATHWAY;  Surgeon:  Johnathan Hausen, MD;  Location: [facility];  Service: General;  Laterality: N/A;  . LAPAROSCOPY N/A 07/20/2017   Procedure: LAPAROSCOPY DIAGNOSTIC. REDUCTION OF SMALL BOWEL OBSTRUCTION. REPAIR OF TROCAR HERNIA.;  Surgeon: Alphonsa Overall, MD;  Location: [facility];  Service: General;  Laterality: N/A;  . TUBAL LIGATION    . WISDOM TOOTH EXTRACTION       Current Meds  Medication Sig  . acetaminophen (TYLENOL) 500 MG tablet Take 1,000 mg by mouth daily as needed for moderate pain or  headache.  . Biotin 10000 MCG TABS Take 10,000 mcg by mouth daily.  Marland Kitchen CALCIUM PO Take 1 capsule by mouth daily.  . Cholecalciferol (VITAMIN D) 2000 units CAPS Take 2,000 Units by mouth daily.  . clonazePAM (KLONOPIN) 0.5 MG tablet Take 1 tablet (0.5 mg total) by mouth daily as needed for anxiety.  Marland Kitchen escitalopram (LEXAPRO) 20 MG tablet Take 1 tablet (20 mg total) by mouth daily.  . ferrous sulfate 325 (65 FE) MG EC tablet Take 325 mg by mouth daily with breakfast.  . levETIRAcetam (KEPPRA XR) 500 MG 24 hr tablet Take 1 tablet (500 mg total) by mouth See admin instructions. Take 500mg  twice daily (Patient taking differently: Take 1,000 mg by mouth See admin instructions. Take 1000mg  twice daily)  . MAGNESIUM PO Take 1 tablet by mouth daily.  . meclizine (ANTIVERT) 25 MG tablet Take 25 mg by mouth 3 (three) times daily as needed for dizziness or nausea.   . Melatonin 3 MG TABS Take 1 tablet (3 mg total) by mouth at bedtime as needed.  . metoprolol tartrate (LOPRESSOR) 25 MG tablet Take 1 tablet (25 mg total) by mouth 2 (two) times daily. Start 2 days before CT scan.  Take a dose 2 hour before the CT scan  Take the remaining pills with you.  You may stop it after the CT scan, unless specified otherwise by me.  . pantoprazole (PROTONIX) 40 MG tablet Take 40 mg by mouth daily.  Marland Kitchen topiramate (TOPAMAX) 100 MG tablet Take 100 mg by mouth 2 (two) times daily.      Allergies:   Patient has no known allergies.   ROS:   Please see the history of present illness.    All other systems reviewed and are negative.   Labs/Other Tests and Data Reviewed:    Recent Labs: 10/15/2018: Magnesium 1.8 02/17/2019: ALT 39; BUN 12; Creat 0.83; Hemoglobin 13.4; Platelets 319; Potassium 4.3; Sodium 141; TSH 1.48   Recent Lipid Panel Lab Results  Component Value Date/Time   CHOL 222 (H) 02/17/2019 08:55 AM   TRIG 94 02/17/2019 08:55 AM   HDL 58 02/17/2019 08:55 AM   CHOLHDL 3.8 02/17/2019 08:55 AM   LDLCALC 144  (H) 02/17/2019 08:55 AM    Wt Readings from Last 3 Encounters:  02/05/19 204 lb (92.5 kg)  01/23/19 199 lb (90.3 kg)  10/13/18 193 lb 15.4 oz (88 kg)     Objective:    Vital Signs:  There were no vitals taken for this visit.   GEN:  alert and oriented RESPIRATORY:  no shortness of breath in conversation PSYCH:  normal affect and mood  Depression screen [facility] 2/9 03/05/2019 02/05/2019 05/29/2017  Decreased Interest 0 0 0  Down, Depressed, Hopeless 1 1 0  PHQ - 2 Score 1 1 0  Altered sleeping 3 3 -  Tired, decreased energy 3 3 -  Change in appetite 0 1 -  Feeling bad or failure about yourself  1  1 -  Trouble concentrating 3 3 -  Moving slowly or fidgety/restless 0 1 -  Suicidal thoughts 1 0 -  PHQ-9 Score 12 13 -  Difficult doing work/chores Very difficult Extremely dIfficult -     ASSESSMENT & PLAN:    1. Depression, major, single episode, moderate (HCC)  - hydrOXYzine (ATARAX/VISTARIL) 10 MG tablet; Take 1 tablet (10 mg total) by mouth at bedtime as needed.  Dispense: 30 tablet; Refill: 0  2. Sleep deprivation  - hydrOXYzine (ATARAX/VISTARIL) 10 MG tablet; Take 1 tablet (10 mg total) by mouth at bedtime as needed.  Dispense: 30 tablet; Refill: 0   Time:   Today, I have spent 20 minutes with the patient with telehealth technology discussing the above problems.     Medication Adjustments/Labs and Tests Ordered: Current medicines are reviewed at length with the patient today.  Concerns regarding medicines are outlined above.   Tests Ordered: No orders of the defined types were placed in this encounter.   Medication Changes: No orders of the defined types were placed in this encounter.   Disposition:  Follow up 8 weeks Signed, Perlie Mayo, NP  03/05/2019 9:43 AM     [facility]

## 2019-03-05 NOTE — Progress Notes (Signed)
WELL-WOMAN EXAMINATION Patient name: Brooke [name] MRN [id number]  Date of birth: 1970/04/07 Chief Complaint:   Gynecologic Exam  History of Present Illness:   Brooke [name] is a 49 y.o. [phone] African American female being seen today for a routine well-woman exam.  Current complaints: bleeding for 1st time since endometrial ablation (2017), started on Sat and stopped Tuesday, changed pad 2x/day, had cramps and dime-sized clots. States a doctor in [place] had given her prem-pro b/c he thought her depression was r/t her hormones, then bleeding started. +moodswings, hot flashes, night sweats, no vaginal dryness. Mom was in 24s when she went through menopause.  Lt breast pain, bilateral nipple discharge. H/O recurrent Lt breast cyst that has to be drained, no problems with it right now.   PCP: Cherly Beach, NP      does not desire labs Patient's last menstrual period was 02/28/2019. The current method of family planning is tubal ligation.  Last pap ~58yrs ago. Results were: normal. H/O abnormal pap: Yes Last mammogram: 2018. Results were: normal. Family h/o breast cancer: Yes MGM 60-70s, PGA 60s Last colonoscopy: 2014. Results were: normal. Family h/o colorectal cancer: No Review of Systems:   Pertinent items are noted in HPI Denies any headaches, blurred vision, fatigue, shortness of breath, chest pain, abdominal pain, abnormal vaginal discharge/itching/odor/irritation, problems with periods, bowel movements, urination, or intercourse unless otherwise stated above. Pertinent History Reviewed:  Reviewed past medical,surgical, social and family history.  Reviewed problem list, medications and allergies. Physical Assessment:   Vitals:   03/05/19 1115  BP: 119/80  Pulse: 61  Weight: 203 lb 6.4 oz (92.3 kg)  Height: 5\' 4"  (1.626 m)  Body mass index is 34.91 kg/m.        Physical Examination:   General appearance - well appearing, and in no distress  Mental status - alert, oriented to  person, place, and time  Psych:  She has a normal mood and affect  Skin - warm and dry, normal color, no suspicious lesions noted  Chest - effort normal, all lung fields clear to auscultation bilaterally  Heart - normal rate and regular rhythm  Neck:  midline trachea, no thyromegaly or nodules  Breasts - Rt appears normal, no suspicious masses, no skin or nipple changes or axillary nodes.  Lt small mass 74fb from nipple @ 3 o'clock, slightly uncomfortable, no nipple discharge expressed by pt, no nodes  Abdomen - soft, nontender, nondistended, no masses or organomegaly  Pelvic - VULVA: normal appearing vulva with no masses, tenderness or lesions  VAGINA: normal appearing vagina with normal color and discharge, no lesions  CERVIX: normal appearing cervix without discharge or lesions, no CMT  Thin prep pap is done w/ HR HPV cotesting  UTERUS: uterus is felt to be enlarged, nontender   ADNEXA: No adnexal masses or tenderness noted.  Extremities:  No swelling or varicosities noted  Chaperone: Peggy Dones    No results found for this or any previous visit (from the past 24 hour(s)).  Assessment & Plan:  1) Well-Woman Exam  2) Lt breast mass, bilateral nipple discharge, Lt breast pain> diagnostic mammo and u/s w/ [place] Wed 3/10 @ 9:50, be there @ 9:30  3) Perimenopausal  4) Abnormal uterine bleeding> x4d after given prem-pro by another physician. S/P endometrial ablation 2017  5) Enlarged uterus> will get pelvic u/s scheduled and f/u w/ [facility] after  Labs/procedures today: pap  Mammogram as scheduled Colonoscopy-recommended to contact GI to see if  needs now or at 49yo d/t being African-American  Orders Placed This Encounter  Procedures  . US BREAST LTD UNI LEFT INC AXILLA  . US PELVIS (TRANSABDOMINAL ONLY)  . US PELVIS TRANSVAGINAL NON-OB (TV ONLY)    Meds: No orders of the defined types were placed in this encounter.   Follow-up: Return for 1st available, US:GYN and f/u  in person w/ [facility].  [place], [facility] 03/05/2019 2:00 PM

## 2019-03-06 ENCOUNTER — Encounter: Payer: Self-pay | Admitting: Family Medicine

## 2019-03-06 LAB — CYTOLOGY - PAP
Comment: NEGATIVE
Diagnosis: NEGATIVE
High risk HPV: NEGATIVE

## 2019-03-06 NOTE — Patient Instructions (Signed)
I appreciate the opportunity to provide you with care for your health and wellness. Today we discussed: depression   Follow up: 8 weeks -depression and sleep  No labs or referrals today  Please continue to practice social distancing to keep you, your family, and our community safe.  If you must go out, please wear a mask and practice good handwashing.  It was a pleasure to see you and I look forward to continuing to work together on your health and well-being. Please do not hesitate to call the office if you need care or have questions about your care.  Have a wonderful day and week. With Gratitude, Cherly Beach, DNP, AGNP-BC

## 2019-03-06 NOTE — Assessment & Plan Note (Signed)
Continue Lexapro at this time.  Advised to consider therapy.  Will be given hydroxyzine to help with sleep.

## 2019-03-06 NOTE — Assessment & Plan Note (Signed)
Will be doing hydroxyzine to see if this helps with sleep.

## 2019-03-11 ENCOUNTER — Ambulatory Visit
Admission: RE | Admit: 2019-03-11 | Discharge: 2019-03-11 | Disposition: A | Discharge status: Home / Self Care | Payer: 59 | Source: Ambulatory Visit | Attending: Women's Health | Admitting: Women's Health

## 2019-03-11 ENCOUNTER — Other Ambulatory Visit: Payer: Self-pay

## 2019-03-11 DIAGNOSIS — N644 Mastodynia: Secondary | ICD-10-CM

## 2019-03-11 DIAGNOSIS — N6452 Nipple discharge: Secondary | ICD-10-CM

## 2019-03-11 DIAGNOSIS — N6320 Unspecified lump in the left breast, unspecified quadrant: Secondary | ICD-10-CM

## 2019-03-12 ENCOUNTER — Ambulatory Visit (INDEPENDENT_AMBULATORY_CARE_PROVIDER_SITE_OTHER): Payer: 59 | Admitting: Psychiatry

## 2019-03-12 ENCOUNTER — Ambulatory Visit (INDEPENDENT_AMBULATORY_CARE_PROVIDER_SITE_OTHER): Payer: 59 | Admitting: Obstetrics and Gynecology

## 2019-03-12 ENCOUNTER — Other Ambulatory Visit: Payer: Self-pay

## 2019-03-12 ENCOUNTER — Ambulatory Visit (INDEPENDENT_AMBULATORY_CARE_PROVIDER_SITE_OTHER): Payer: 59

## 2019-03-12 ENCOUNTER — Encounter (HOSPITAL_COMMUNITY): Payer: Self-pay | Admitting: Psychiatry

## 2019-03-12 VITALS — BP 106/77 | HR 60 | Ht 62.0 in | Wt 204.0 lb

## 2019-03-12 DIAGNOSIS — N852 Hypertrophy of uterus: Secondary | ICD-10-CM | POA: Diagnosis not present

## 2019-03-12 DIAGNOSIS — D250 Submucous leiomyoma of uterus: Secondary | ICD-10-CM

## 2019-03-12 DIAGNOSIS — N939 Abnormal uterine and vaginal bleeding, unspecified: Secondary | ICD-10-CM | POA: Diagnosis not present

## 2019-03-12 DIAGNOSIS — F410 Panic disorder [episodic paroxysmal anxiety] without agoraphobia: Secondary | ICD-10-CM | POA: Diagnosis not present

## 2019-03-12 DIAGNOSIS — F330 Major depressive disorder, recurrent, mild: Secondary | ICD-10-CM | POA: Diagnosis not present

## 2019-03-12 DIAGNOSIS — F422 Mixed obsessional thoughts and acts: Secondary | ICD-10-CM

## 2019-03-12 NOTE — Patient Instructions (Addendum)
1. Continue lexapro 20 mg daily  2. Continue clonazepam 0.5 mg daily as needed for anxiety  3. Continue hydroxyzine 10 mg daily as needed for anxiety  4  Referral to therapy  5. Next appointment: 5/12 at 9:10

## 2019-03-12 NOTE — Progress Notes (Signed)
PELVIC US TA/TV:heterogeneous anteverted uterus,mid right intramural fibroid 3.8 x 3.9 x 2.7 cm,EEC 6.8 mm,normal ovaries,ovaries appear mobile,no free fluid,no pain during ultrasound

## 2019-03-12 NOTE — Progress Notes (Signed)
[facility] Visit  @DATE @            Patient name: Brooke [name] MRN [id number]  Date of birth: 1970-09-27  CC & HPI:  Brooke [name] is a 49 y.o. female presenting today for review of u/s. This u/s shows a large fibroid which is either submucosal or Type 0 fibroid in endometrial cavity. Will recommend SHG to clarify before planning treatment. Will do ENDOMETRIAL BIOPSY  At the time of the [facility].  ROS:  ROS   Pertinent History Reviewed:   Reviewed: Significant for  Medical         Past Medical History:  Diagnosis Date  . Acid reflux   . Anxiety   . Cervical radiculitis   . Chronic constipation   . DDD (degenerative disc disease), lumbar   . Depression   . Dizzy spells   . Facial numbness   . Heart palpitations 01/2017  . History of hiatal hernia   . Hypertension   . Insomnia   . Iron deficiency anemia   . LUQ pain 11/19/2012  . Migraines    occ  . Near syncope 01/2017  . Numbness and tingling in left arm   . Obesity   . Panic attacks   . Sciatica   . Seizures ([place])   . Small bowel obstruction ([place]) 07/20/2017                              Surgical Hx:    Past Surgical History:  Procedure Laterality Date  . BUNIONECTOMY Left yrs ago  . CERVICAL ABLATION  2017  . COLONOSCOPY, ESOPHAGOGASTRODUODENOSCOPY (EGD) AND ESOPHAGEAL DILATION N/A 12/03/2012   [id number] melanosis throughout the entire examined colon/The colon IS redundant/Small internal hemorrhoids/EGD:Esophageal web/Medium sized hiatal hernia/MILD Non-erosive gastritis  . ESOPHAGOGASTRODUODENOSCOPY  03/09/09   Dr. Wilford Corner, normal EGD, s/p Bravo capsule placement  . GASTRIC ROUX-EN-Y N/A 07/16/2017   Procedure: LAPAROSCOPIC ROUX-EN-Y GASTRIC BYPASS WITH UPPER ENDOSCOPY AND ERAS PATHWAY;  Surgeon: Johnathan Hausen, MD;  Location: [facility];  Service: General;  Laterality: N/A;  . LAPAROSCOPY N/A 07/20/2017   Procedure: LAPAROSCOPY DIAGNOSTIC. REDUCTION OF SMALL BOWEL OBSTRUCTION. REPAIR OF TROCAR  HERNIA.;  Surgeon: Alphonsa Overall, MD;  Location: [facility];  Service: General;  Laterality: N/A;  . TUBAL LIGATION    . WISDOM TOOTH EXTRACTION     Medications: Reviewed & Updated - see associated section                       Current Outpatient Medications:  .  acetaminophen (TYLENOL) 500 MG tablet, Take 1,000 mg by mouth daily as needed for moderate pain or headache., Disp: , Rfl:  .  Biotin 10000 MCG TABS, Take 10,000 mcg by mouth daily., Disp: , Rfl:  .  CALCIUM PO, Take 1 capsule by mouth daily., Disp: , Rfl:  .  Cholecalciferol (VITAMIN D) 2000 units CAPS, Take 2,000 Units by mouth daily., Disp: , Rfl:  .  clonazePAM (KLONOPIN) 0.5 MG tablet, Take 1 tablet (0.5 mg total) by mouth daily as needed for anxiety., Disp: 30 tablet, Rfl: 2 .  escitalopram (LEXAPRO) 20 MG tablet, Take 1 tablet (20 mg total) by mouth daily., Disp: 30 tablet, Rfl: 1 .  hydrOXYzine (ATARAX/VISTARIL) 10 MG tablet, Take 1 tablet (10 mg total) by mouth at bedtime as needed., Disp: 30 tablet, Rfl: 0 .  levETIRAcetam (KEPPRA XR) 500 MG  24 hr tablet, Take 1 tablet (500 mg total) by mouth See admin instructions. Take 500mg  twice daily (Patient taking differently: Take 1,000 mg by mouth See admin instructions. Take 1000mg  twice daily), Disp: 60 tablet, Rfl:  .  MAGNESIUM PO, Take 1 tablet by mouth daily., Disp: , Rfl:  .  meclizine (ANTIVERT) 25 MG tablet, Take 25 mg by mouth 3 (three) times daily as needed for dizziness or nausea. , Disp: , Rfl:  .  pantoprazole (PROTONIX) 40 MG tablet, Take 40 mg by mouth daily., Disp: , Rfl:  .  topiramate (TOPAMAX) 100 MG tablet, Take 100 mg by mouth 2 (two) times daily. , Disp: , Rfl:    Social History: Reviewed -  reports that she quit smoking about 6 years ago. Her smoking use included cigarettes. She has a 6.90 pack-year smoking history. She has never used smokeless tobacco.  Objective Findings:  Vitals: Blood pressure 106/77, pulse 60, height 5\' 2"  (1.575 m), weight 204 lb (92.5  kg), last menstrual period 02/28/2019.  PHYSICAL EXAMINATION General appearance - alert, well appearing, and in no distress and oriented to person, place, and time Mental status - alert, oriented to person, place, and time, normal mood, behavior, speech, dress, motor activity, and thought processes Chest - clear to auscultation, no wheezes, rales or rhonchi, symmetric air entry Heart - normal rate and regular rhythm Abdomen -  Breasts -  Skin -   PELVIC see last exam. Talk only today Patient's last menstrual period was 02/28/2019.  External genitalia -  Vulva -  Vagina -  Cervix -   Uterus -   Adnexa -  [place]et [place] -  Rectal -     Assessment & Plan:   A:  1.  menorrhagia, with regular cycle 2. Submucosal fibroid versus endometrial type 0 fibroid..  P:  1.  schedule SHG after next menses.

## 2019-03-31 ENCOUNTER — Other Ambulatory Visit: Payer: Self-pay | Admitting: Obstetrics and Gynecology

## 2019-03-31 DIAGNOSIS — N939 Abnormal uterine and vaginal bleeding, unspecified: Secondary | ICD-10-CM

## 2019-04-02 ENCOUNTER — Other Ambulatory Visit: Payer: Self-pay

## 2019-04-02 ENCOUNTER — Other Ambulatory Visit: Payer: Self-pay | Admitting: Obstetrics and Gynecology

## 2019-04-02 ENCOUNTER — Ambulatory Visit (INDEPENDENT_AMBULATORY_CARE_PROVIDER_SITE_OTHER): Payer: 59

## 2019-04-02 DIAGNOSIS — D251 Intramural leiomyoma of uterus: Secondary | ICD-10-CM

## 2019-04-02 DIAGNOSIS — N939 Abnormal uterine and vaginal bleeding, unspecified: Secondary | ICD-10-CM

## 2019-04-02 MED ORDER — VENLAFAXINE HCL ER 37.5 MG PO CP24: 75.0000 mg | ORAL_CAPSULE | Freq: Every day | ORAL | 6 refills | Status: DC

## 2019-04-02 NOTE — Progress Notes (Signed)
[place] SONOHYSTEROGRAM: Sonohysterogram was preformed by Dr.Ferguson with ultrasound guidance and surveillance throughout the procedure.  The sonohysterogram did not allow the endometrial cavity to fully expand.  Anterior mid / right intramural fibroid 3.9 x 3.9 x 2.7 cm.

## 2019-04-02 NOTE — Progress Notes (Signed)
Sonohysterogram performed today establish a relationship of the 4 cm fibroid to the endometrial stripe.  The patient has been on Prempro but has discontinued this.  She is now having light spotting with a few small clots for up to 6 days/month.  She is tolerating this well. Sonohysterogram shows that the fibroid is intramural in location and barely reaches the surface of the endometrial cavity.  Sonohysterogram did not allow the endometrial cavity to expand fully.  I suspect there adhesions status post endometrial ablation in the upper half of the endometrial cavity overlying the fibroid. There is no shaggy areas of abnormality that I can appreciate on endometrial stripe.  The lower half of the endometrial stripe is well delineated and quite thin  Discussion with the patient post procedure about treatment for her vasomotor symptoms involves treatment options.  We have agreed to try Effexor 37.5 mg as a treatment for vasomotor instability of menopause.  Patient is already on Lexapro and will continue both.  Once the Effexor has been on for couple weeks she has the option of discontinuing the Lexapro after discussing with Cherly Beach her nurse practitioner  6 months prescription for Effexor sent to pharmacy of record

## 2019-04-06 ENCOUNTER — Encounter: Payer: Self-pay | Admitting: Family Medicine

## 2019-04-08 ENCOUNTER — Other Ambulatory Visit: Payer: Self-pay

## 2019-04-08 ENCOUNTER — Telehealth (INDEPENDENT_AMBULATORY_CARE_PROVIDER_SITE_OTHER): Payer: 59 | Admitting: Family Medicine

## 2019-04-08 ENCOUNTER — Encounter: Payer: Self-pay | Admitting: Family Medicine

## 2019-04-08 VITALS — BP 106/77 | Ht 62.0 in | Wt 204.0 lb

## 2019-04-08 DIAGNOSIS — K5909 Other constipation: Secondary | ICD-10-CM | POA: Diagnosis not present

## 2019-04-08 MED ORDER — LINACLOTIDE 72 MCG PO CAPS: 72.0000 ug | ORAL_CAPSULE | Freq: Every day | ORAL | 1 refills | Status: DC

## 2019-04-08 NOTE — Patient Instructions (Addendum)
I appreciate the opportunity to provide you with care for your health and wellness. Today we discussed: Ongoing constipation and bloating Follow up: 3 to 4 weeks can be by phone  No labs   No referrals today, however if you do not feel any better on the medicine we will do a referral to GI specialist I have attached fiber content foods to help it easier for you to count the grams need. Increase water intake. Try to get at least 30 minutes of 60 minutes of physical activity daily to help with gut health.  Please continue to practice social distancing to keep you, your family, and our community safe.  If you must go out, please wear a mask and practice good handwashing.  It was a pleasure to see you and I look forward to continuing to work together on your health and well-being. Please do not hesitate to call the office if you need care or have questions about your care.  Have a wonderful day and week. With Gratitude, Cherly Beach, DNP, AGNP-BC  Fiber Content in Foods  See the following list for the dietary fiber content of some common foods. High-fiber foods High-fiber foods contain 4 grams or more (4g or more) of fiber per serving. They include:  Artichoke (fresh) - 1 medium has 10.3g of fiber.  Baked beans, plain or vegetarian (canned) -  cup has 5.2g of fiber.  Blackberries or raspberries (fresh) -  cup has 4g of fiber.  Bran cereal -  cup has 8.6g of fiber.  Bulgur (cooked) -  cup has 4g of fiber.  Kidney beans (canned) -  cup has 6.8g of fiber.  Lentils (cooked) -  cup has 7.8g of fiber.  Pear (fresh) - 1 medium has 5.1g of fiber.  Peas (frozen) -  cup has 4.4g of fiber.  Pinto beans (canned) -  cup has 5.5g of fiber.  Pinto beans (dried and cooked) -  cup has 7.7g of fiber.  Potato with skin (baked) - 1 medium has 4.4g of fiber.  Quinoa (cooked) -  cup has 5g of fiber.  Soybeans (canned, frozen, or fresh) -  cup has 5.1g of fiber. Moderate-fiber  foods Moderate-fiber foods contain 1-4 grams (1-4g) of fiber per serving. They include:  Almonds - 1 oz. has 3.5g of fiber.  Apple with skin - 1 medium has 3.3g of fiber.  Applesauce, sweetened -  cup has 1.5g of fiber.  Bagel, plain - one 4-inch (10-cm) bagel has 2g of fiber.  Banana - 1 medium has 3.1g of fiber.  Broccoli (cooked) -  cup has 2.5g of fiber.  Carrots (cooked) -  cup has 2.3g of fiber.  Corn (canned or frozen) -  cup has 2.1g of fiber.  Corn tortilla - one 6-inch (15-cm) tortilla has 1.5g of fiber.  Green beans (canned) -  cup has 2g of fiber.  Instant oatmeal -  cup has about 2g of fiber.  Long-grain brown rice (cooked) - 1 cup has 3.5g of fiber.  Macaroni, enriched (cooked) - 1 cup has 2.5g of fiber.  Melon - 1 cup has 1.4g of fiber.  Multigrain cereal -  cup has about 2-4g of fiber.  Orange - 1 small has 3.1g of fiber.  Potatoes, mashed -  cup has 1.6g of fiber.  Raisins - 1/4 cup has 1.6g of fiber.  Squash -  cup has 2.9g of fiber.  Sunflower seeds -  cup has 1.1g of fiber.  Tomato - 1 medium  has 1.5g of fiber.  Vegetable or soy patty - 1 has 3.4g of fiber.  Whole-wheat bread - 1 slice has 2g of fiber.  Whole-wheat spaghetti -  cup has 3.2g of fiber. Low-fiber foods Low-fiber foods contain less than 1 gram (less than 1g) of fiber per serving. They include:  Egg - 1 large.  Flour tortilla - one 6-inch (15-cm) tortilla.  Fruit juice -  cup.  Lettuce - 1 cup.  Meat, poultry, or fish - 1 oz.  Milk - 1 cup.  Spinach (raw) - 1 cup.  White bread - 1 slice.  White rice -  cup.  Yogurt -  cup. Actual amounts of fiber in foods may be different depending on processing. Talk with your dietitian about how much fiber you need in your diet. This information is not intended to replace advice given to you by your health care provider. Make sure you discuss any questions you have with your health care provider. Document  Revised: 08/11/2015 Document Reviewed: 02/10/2015 Elsevier Patient Education  [place].

## 2019-04-08 NOTE — Assessment & Plan Note (Signed)
Extensive discussion on diet needs, provided with written information on fiber content.  Help to increase water and fiber content in diet naturally.  Will start Linzess to see if that can help with bowel movement production.  If no improvement in pain, bloating, constipation will refer back to GI to see if they can help with treatment plan.  Reviewed side effects, risks and benefits of medication.   Patient acknowledged agreement and understanding of the plan.

## 2019-04-08 NOTE — Progress Notes (Signed)
Virtual Visit via Video Note   This visit type was conducted due to national recommendations for restrictions regarding the COVID-19 Pandemic (e.g. social distancing) in an effort to limit this patient's exposure and mitigate transmission in our community.  Due to her co-morbid illnesses, this patient is at least at moderate risk for complications without adequate follow up.  This format is felt to be most appropriate for this patient at this time.  All issues noted in this document were discussed and addressed.  A limited physical exam was performed with this format.  Evaluation Performed:  Follow-up visit  Date:  04/08/2019   ID:  Brooke [name], DOB 27-Jun-1970, MRN [id number]  Patient Location: Home Provider Location: Office  Location of Patient: Home Location of Provider: Telehealth Consent was obtain for visit to be over via telehealth. I verified that I am speaking with the correct person using two identifiers.  PCP:  Perlie Mayo, NP   Chief Complaint: Chronic constipation and bloating  History of Present Illness:    Brooke [name] is a 49 y.o. female with history of chronic constipation, irritable bowel syndrome presents today with ongoing bloating and constipation that requires her to take milk of magnesia 1-2 sometimes 3 times a week to help produce a bowel movement.  She reports that she tried to doing elimination diet including removal of gluten, pastas, breads, corn and rice and other light and like products.  This did not help her at all she still remains having bloating issues and constipation with foods that she does eat.  She reports she tries to eat Cheerios and things with fiber but is unsure if she gets enough fiber in her diet.  She drinks water but probably not a lot enough water.  She is also taken iron in the past and that has made things a lot worse.  Currently not taking any iron at this time.  She has bad stomach pains usually start about 5 to 10 minutes right  after eating she denies having any heartburn, nausea or vomiting.  Has not been seen by GI specialist in about 5+ years.  Previously was seen by Dr. Michail Sermon is open to seeing somebody here in [place] so that she can get the care she needs if our treatment plan does not work.  The patient does not have symptoms concerning for COVID-19 infection (fever, chills, cough, or new shortness of breath).   Past Medical, Surgical, Social History, Allergies, and Medications have been Reviewed.  Past Medical History:  Diagnosis Date  . Acid reflux   . Anxiety   . Cervical radiculitis   . Chronic constipation   . DDD (degenerative disc disease), lumbar   . Depression   . Dizzy spells   . Facial numbness   . Heart palpitations 01/2017  . History of hiatal hernia   . Hypertension   . Insomnia   . Iron deficiency anemia   . LUQ pain 11/19/2012  . Migraines    occ  . Near syncope 01/2017  . Numbness and tingling in left arm   . Obesity   . Panic attacks   . Sciatica   . Seizures ([place])   . Small bowel obstruction ([place]) 07/20/2017   Past Surgical History:  Procedure Laterality Date  . BUNIONECTOMY Left yrs ago  . CERVICAL ABLATION  2017  . COLONOSCOPY, ESOPHAGOGASTRODUODENOSCOPY (EGD) AND ESOPHAGEAL DILATION N/A 12/03/2012   [id number] melanosis throughout the entire examined colon/The colon IS redundant/Small internal  hemorrhoids/EGD:Esophageal web/Medium sized hiatal hernia/MILD Non-erosive gastritis  . ESOPHAGOGASTRODUODENOSCOPY  03/09/09   Dr. Wilford Corner, normal EGD, s/p Bravo capsule placement  . GASTRIC ROUX-EN-Y N/A 07/16/2017   Procedure: LAPAROSCOPIC ROUX-EN-Y GASTRIC BYPASS WITH UPPER ENDOSCOPY AND ERAS PATHWAY;  Surgeon: Johnathan Hausen, MD;  Location: [facility];  Service: General;  Laterality: N/A;  . LAPAROSCOPY N/A 07/20/2017   Procedure: LAPAROSCOPY DIAGNOSTIC. REDUCTION OF SMALL BOWEL OBSTRUCTION. REPAIR OF TROCAR HERNIA.;  Surgeon: Alphonsa Overall, MD;  Location: [facility];   Service: General;  Laterality: N/A;  . TUBAL LIGATION    . WISDOM TOOTH EXTRACTION       Current Meds  Medication Sig  . acetaminophen (TYLENOL) 500 MG tablet Take 1,000 mg by mouth daily as needed for moderate pain or headache.  . Biotin 10000 MCG TABS Take 10,000 mcg by mouth daily.  Marland Kitchen CALCIUM PO Take 1 capsule by mouth daily.  . Cholecalciferol (VITAMIN D) 2000 units CAPS Take 2,000 Units by mouth daily.  . clonazePAM (KLONOPIN) 0.5 MG tablet Take 1 tablet (0.5 mg total) by mouth daily as needed for anxiety.  Marland Kitchen escitalopram (LEXAPRO) 20 MG tablet Take 1 tablet (20 mg total) by mouth daily.  . hydrOXYzine (ATARAX/VISTARIL) 10 MG tablet Take 1 tablet (10 mg total) by mouth at bedtime as needed.  . levETIRAcetam (KEPPRA XR) 500 MG 24 hr tablet Take 1 tablet (500 mg total) by mouth See admin instructions. Take 500mg  twice daily (Patient taking differently: Take 1,000 mg by mouth See admin instructions. Take 1000mg  twice daily)  . MAGNESIUM PO Take 1 tablet by mouth daily.  . meclizine (ANTIVERT) 25 MG tablet Take 25 mg by mouth 3 (three) times daily as needed for dizziness or nausea.   . pantoprazole (PROTONIX) 40 MG tablet Take 40 mg by mouth daily.  Marland Kitchen topiramate (TOPAMAX) 100 MG tablet Take 100 mg by mouth 2 (two) times daily.   Marland Kitchen venlafaxine XR (EFFEXOR-XR) 37.5 MG 24 hr capsule Take 2 capsules (75 mg total) by mouth daily.     Allergies:   Patient has no known allergies.   ROS:   Please see the history of present illness.    All other systems reviewed and are negative.   Labs/Other Tests and Data Reviewed:    Recent Labs: 10/15/2018: Magnesium 1.8 02/17/2019: ALT 39; BUN 12; Creat 0.83; Hemoglobin 13.4; Platelets 319; Potassium 4.3; Sodium 141; TSH 1.48   Recent Lipid Panel Lab Results  Component Value Date/Time   CHOL 222 (H) 02/17/2019 08:55 AM   TRIG 94 02/17/2019 08:55 AM   HDL 58 02/17/2019 08:55 AM   CHOLHDL 3.8 02/17/2019 08:55 AM   LDLCALC 144 (H) 02/17/2019 08:55  AM    Wt Readings from Last 3 Encounters:  04/08/19 204 lb (92.5 kg)  03/12/19 204 lb (92.5 kg)  03/05/19 203 lb 6.4 oz (92.3 kg)     Objective:    Vital Signs:  BP 106/77   Ht 5\' 2"  (1.575 m)   Wt 204 lb (92.5 kg)   BMI 37.31 kg/m    VITAL SIGNS:  reviewed GEN:  no acute distress EYES:  sclerae anicteric, EOMI - Extraocular Movements Intact RESPIRATORY:  normal respiratory effort, symmetric expansion SKIN:  no rash, lesions or ulcers. MUSCULOSKELETAL:  no obvious deformities. NEURO:  alert and oriented x 3, no obvious focal deficit PSYCH:  normal affect  ASSESSMENT & PLAN:    1. CONSTIPATION, CHRONIC  - linaclotide (LINZESS) 72 MCG capsule; Take 1 capsule (72 mcg  total) by mouth daily before breakfast.  Dispense: 30 capsule; Refill: 1   Time:   Today, I have spent 10 minutes with the patient with telehealth technology discussing the above problems.     Medication Adjustments/Labs and Tests Ordered: Current medicines are reviewed at length with the patient today.  Concerns regarding medicines are outlined above.   Tests Ordered: No orders of the defined types were placed in this encounter.   Medication Changes: No orders of the defined types were placed in this encounter.   Disposition:  Follow up 3-4 weeks  Signed, Perlie Mayo, NP  04/08/2019 11:40 AM     [place]

## 2019-04-13 ENCOUNTER — Other Ambulatory Visit: Payer: Self-pay | Admitting: *Deleted

## 2019-04-13 DIAGNOSIS — F321 Major depressive disorder, single episode, moderate: Secondary | ICD-10-CM

## 2019-04-13 DIAGNOSIS — Z72820 Sleep deprivation: Secondary | ICD-10-CM

## 2019-04-13 MED ORDER — HYDROXYZINE HCL 10 MG PO TABS: 10.0000 mg | ORAL_TABLET | Freq: Every evening | ORAL | 0 refills | Status: DC | PRN

## 2019-04-22 ENCOUNTER — Other Ambulatory Visit: Payer: Self-pay | Admitting: *Deleted

## 2019-04-22 DIAGNOSIS — F321 Major depressive disorder, single episode, moderate: Secondary | ICD-10-CM

## 2019-04-22 MED ORDER — ESCITALOPRAM OXALATE 20 MG PO TABS: 20.0000 mg | ORAL_TABLET | Freq: Every day | ORAL | 1 refills | Status: DC

## 2019-04-23 ENCOUNTER — Ambulatory Visit: Payer: 59 | Admitting: Family Medicine

## 2019-05-04 ENCOUNTER — Encounter: Payer: Self-pay | Admitting: Internal Medicine

## 2019-05-04 ENCOUNTER — Other Ambulatory Visit: Payer: Self-pay | Admitting: *Deleted

## 2019-05-04 ENCOUNTER — Encounter: Payer: Self-pay | Admitting: Family Medicine

## 2019-05-04 ENCOUNTER — Ambulatory Visit (INDEPENDENT_AMBULATORY_CARE_PROVIDER_SITE_OTHER): Payer: 59 | Admitting: Family Medicine

## 2019-05-04 ENCOUNTER — Other Ambulatory Visit: Payer: Self-pay

## 2019-05-04 DIAGNOSIS — Z72820 Sleep deprivation: Secondary | ICD-10-CM

## 2019-05-04 DIAGNOSIS — F321 Major depressive disorder, single episode, moderate: Secondary | ICD-10-CM

## 2019-05-04 DIAGNOSIS — R7989 Other specified abnormal findings of blood chemistry: Secondary | ICD-10-CM

## 2019-05-04 DIAGNOSIS — K5909 Other constipation: Secondary | ICD-10-CM

## 2019-05-04 DIAGNOSIS — E783 Hyperchylomicronemia: Secondary | ICD-10-CM

## 2019-05-04 DIAGNOSIS — R748 Abnormal levels of other serum enzymes: Secondary | ICD-10-CM

## 2019-05-04 DIAGNOSIS — E559 Vitamin D deficiency, unspecified: Secondary | ICD-10-CM | POA: Insufficient documentation

## 2019-05-04 DIAGNOSIS — D509 Iron deficiency anemia, unspecified: Secondary | ICD-10-CM

## 2019-05-04 HISTORY — DX: Other specified abnormal findings of blood chemistry: R79.89

## 2019-05-04 HISTORY — DX: Abnormal levels of other serum enzymes: R74.8

## 2019-05-04 MED ORDER — HYDROXYZINE HCL 10 MG PO TABS: 10.0000 mg | ORAL_TABLET | Freq: Every evening | ORAL | 0 refills | Status: DC | PRN

## 2019-05-04 MED ORDER — PANTOPRAZOLE SODIUM 40 MG PO TBEC: 40.0000 mg | DELAYED_RELEASE_TABLET | Freq: Every day | ORAL | 3 refills | Status: DC

## 2019-05-04 NOTE — Assessment & Plan Note (Signed)
Obesity linked to hypertension, depression, hyperlipidemia  Brooke [name] is re-educated about the importance of exercise daily to help with weight management. A minumum of 30 minutes daily is recommended. Additionally, importance of healthy food choices  with portion control discussed.   Wt Readings from Last 3 Encounters:  05/04/19 208 lb 1.9 oz (94.4 kg)  04/08/19 204 lb (92.5 kg)  03/12/19 204 lb (92.5 kg)

## 2019-05-04 NOTE — Assessment & Plan Note (Signed)
Is not taking B12 supplements and elevation in B12 will be following up with this at next lab check.

## 2019-05-04 NOTE — Progress Notes (Signed)
Subjective:  Patient ID: Brooke [name], female    DOB: September 10, 1970  Age: 49 y.o. MRN: [id number]  CC:  Chief Complaint  Patient presents with  . Follow-up    depression and sleep pt is sleeping better and feels a whole lot better   . Constipation    pt was put on linzess but this is not working for her at all       HPI  HPI  Brooke [name] is a 49 year old female patient who presents today for follow-up on depression, sleep trouble, constipation.  Has a history of anxiety, depression, heart palpitations, hypertension, obesity, panic attacks, seizures among others.  She reports that she is taking Lexapro as well as Effexor I have advised that this is not safe to do.  We will stop Lexapro as Effexor was placed by Dr. Glo Herring to help with possible hormone changes as she has a mass that is growing.   We will continue the hydroxyzine as this is helping her with sleep.  She reports taking all her other medications without any issues or concerns.  Needs refill on Protonix.  She reports that the Linzess did not help with her constipation.  She reports that she has been trying to eat better, increase fiber and water in her diet.  As well as trying to exercise some but she does not receive any relief from taking the Linzess.  She is open to going to a GI provider to have full work-up and assessment of this.  She denies having any blood in her stool.  She denies having any pain today.  She denies having any nausea or vomiting.  From April 9 note: history of chronic constipation, irritable bowel syndrome presents today with ongoing bloating and constipation that requires her to take milk of magnesia 1-2 sometimes 3 times a week to help produce a bowel movement.  She reports that she tried to doing elimination diet including removal of gluten, pastas, breads, corn and rice and other light and like products.  This did not help her at all she still remains having bloating issues and constipation with  foods that she does eat.  She reports she tries to eat Cheerios and things with fiber but is unsure if she gets enough fiber in her diet.  She drinks water but probably not a lot enough water.  She is also taken iron in the past and that has made things a lot worse.  Currently not taking any iron at this time.  She has bad stomach pains usually start about 5 to 10 minutes right after eating she denies having any heartburn, nausea or vomiting.  Has not been seen by GI specialist in about 5+ years.  Previously was seen by Dr. Michail Sermon is open to seeing somebody here in [place] so that she can get the care she needs if our treatment plan does not work.  She is due to have some updated lab work.  As she has been trying to work on her cholesterol as well.  She did have elevated B12 at her last lab drawl but reports she is not taking any.  And has been taking vitamin D to help get her vitamin D levels up.  Today patient denies signs and symptoms of COVID 19 infection including fever, chills, cough, shortness of breath, and headache. Past Medical, Surgical, Social History, Allergies, and Medications have been Reviewed.   Past Medical History:  Diagnosis Date  . Acid reflux   .  Anxiety   . Cervical radiculitis   . Chronic constipation   . DDD (degenerative disc disease), lumbar   . Depression   . Dizzy spells   . Facial numbness   . Heart palpitations 01/2017  . History of hiatal hernia   . Hypertension   . Insomnia   . Iron deficiency anemia   . LUQ pain 11/19/2012  . Migraines    occ  . Near syncope 01/2017  . Numbness and tingling in left arm   . Obesity   . Panic attacks   . Sciatica   . Seizures ([place])   . Small bowel obstruction ([facility]) 07/20/2017    Current Meds  Medication Sig  . acetaminophen (TYLENOL) 500 MG tablet Take 1,000 mg by mouth daily as needed for moderate pain or headache.  . Biotin 10000 MCG TABS Take 10,000 mcg by mouth daily.  Marland Kitchen CALCIUM PO Take 1 capsule by mouth  daily.  . Cholecalciferol (VITAMIN D) 2000 units CAPS Take 2,000 Units by mouth daily.  . hydrOXYzine (ATARAX/VISTARIL) 10 MG tablet Take 1 tablet (10 mg total) by mouth at bedtime as needed.  . levETIRAcetam (KEPPRA XR) 500 MG 24 hr tablet Take 1 tablet (500 mg total) by mouth See admin instructions. Take 500mg  twice daily (Patient taking differently: Take 1,000 mg by mouth See admin instructions. Take 1000mg  twice daily)  . topiramate (TOPAMAX) 100 MG tablet Take 100 mg by mouth 2 (two) times daily.   Marland Kitchen venlafaxine XR (EFFEXOR-XR) 37.5 MG 24 hr capsule Take 2 capsules (75 mg total) by mouth daily.  . [DISCONTINUED] clonazePAM (KLONOPIN) 0.5 MG tablet Take 1 tablet (0.5 mg total) by mouth daily as needed for anxiety.  . [DISCONTINUED] escitalopram (LEXAPRO) 20 MG tablet Take 1 tablet (20 mg total) by mouth daily.  . [DISCONTINUED] hydrOXYzine (ATARAX/VISTARIL) 10 MG tablet Take 1 tablet (10 mg total) by mouth at bedtime as needed.  . [DISCONTINUED] linaclotide (LINZESS) 72 MCG capsule Take 1 capsule (72 mcg total) by mouth daily before breakfast.  . [DISCONTINUED] MAGNESIUM PO Take 1 tablet by mouth daily.  . [DISCONTINUED] meclizine (ANTIVERT) 25 MG tablet Take 25 mg by mouth 3 (three) times daily as needed for dizziness or nausea.   . [DISCONTINUED] pantoprazole (PROTONIX) 40 MG tablet Take 40 mg by mouth daily.    ROS:  Review of Systems  Constitutional: Negative.   HENT: Negative.   Eyes: Negative.   Respiratory: Negative.   Cardiovascular: Negative.   Gastrointestinal: Negative.   Genitourinary: Negative.   Musculoskeletal: Negative.   Skin: Negative.   Neurological: Negative.   Endo/Heme/Allergies: Negative.   Psychiatric/Behavioral: Negative.   All other systems reviewed and are negative.    Objective:   Today's Vitals: BP 116/80 (BP Location: Right Arm, Patient Position: Sitting, Cuff Size: Normal)   Pulse 62   Temp 98 F (36.7 C) (Temporal)   Ht 5\' 2"  (1.575 m)   Wt  208 lb 1.9 oz (94.4 kg)   SpO2 95%   BMI 38.07 kg/m  Vitals with BMI 05/04/2019 04/08/2019 03/12/2019  Height 5\' 2"  5\' 2"  5\' 2"   Weight 208 lbs 2 oz 204 lbs 204 lbs  BMI 38.06 123XX123 [id number]  Systolic [id number]  Diastolic 80 77 77  Pulse 62 - 60     Physical Exam Vitals and nursing note reviewed.  Constitutional:      Appearance: Normal appearance. She is well-developed and well-groomed. She is obese.  HENT:     Head:  Normocephalic and atraumatic.     Right Ear: External ear normal.     Left Ear: External ear normal.     Mouth/Throat:     Comments: Mask in place Eyes:     General:        Right eye: No discharge.        Left eye: No discharge.     Conjunctiva/sclera: Conjunctivae normal.  Cardiovascular:     Rate and Rhythm: Normal rate and regular rhythm.     Pulses: Normal pulses.     Heart sounds: Normal heart sounds.  Pulmonary:     Effort: Pulmonary effort is normal.     Breath sounds: Normal breath sounds.  Musculoskeletal:        General: Normal range of motion.     Cervical back: Normal range of motion and neck supple.  Skin:    General: Skin is warm.  Neurological:     General: No focal deficit present.     Mental Status: She is alert and oriented to person, place, and time.  Psychiatric:        Attention and Perception: Attention normal.        Mood and Affect: Mood normal.        Speech: Speech normal.        Behavior: Behavior normal. Behavior is cooperative.        Thought Content: Thought content normal.        Cognition and Memory: Cognition normal.        Judgment: Judgment normal.     Assessment   1. Morbid obesity ([place])   2. Depression, major, single episode, moderate ([place])   3. Sleep deprivation   4. CONSTIPATION, CHRONIC   5. Mixed hyperglyceridemia   6. Iron deficiency anemia, unspecified iron deficiency anemia type   7. Vitamin D deficiency   8. Elevated vitamin B12 level     Tests ordered Orders Placed This Encounter  Procedures  .  Lipid panel  . Hemoglobin A1c  . COMPLETE METABOLIC PANEL WITH GFR  . CBC  . Vitamin B12  . VITAMIN D 25 Hydroxy (Vit-D Deficiency, Fractures)  . Ambulatory referral to Gastroenterology     Plan: Please see assessment and plan per problem list above.   Meds ordered this encounter  Medications  . hydrOXYzine (ATARAX/VISTARIL) 10 MG tablet    Sig: Take 1 tablet (10 mg total) by mouth at bedtime as needed.    Dispense:  30 tablet    Refill:  0    Order Specific Question:   Supervising Provider    Answer:   Fayrene Helper [id number]    Patient to follow-up in 3 months  Perlie Mayo, NP

## 2019-05-04 NOTE — Assessment & Plan Note (Signed)
Did not have anemia on last lab check.  Reports that she is feeling better not as much lethargic and down.  Is taking her vitamin D as directed.  We will get updated labs in the future to make sure that she is keeping her hemoglobin levels up.

## 2019-05-04 NOTE — Assessment & Plan Note (Signed)
She needs to get updated labs.  I encouraged a heart healthy low-fat diet. We will get labs before next appointment.

## 2019-05-04 NOTE — Patient Instructions (Signed)
I appreciate the opportunity to provide you with care for your health and wellness. Today we discussed: constipation and mood  Follow up: 3 months   Labs placed please get 1 week before or day of appt Referral to GI today  Increase fiber intake-daily Increase water to 2.5-3 L daily. Walk at least a mile daily (20-30 minutes)  Please continue to practice social distancing to keep you, your family, and our community safe.  If you must go out, please wear a mask and practice good handwashing.  It was a pleasure to see you and I look forward to continuing to work together on your health and well-being. Please do not hesitate to call the office if you need care or have questions about your care.  Have a wonderful day and week. With Gratitude, Cherly Beach, DNP, AGNP-BC

## 2019-05-04 NOTE — Assessment & Plan Note (Signed)
Much improved continue hydroxyzine at this time.

## 2019-05-04 NOTE — Assessment & Plan Note (Signed)
Linzess did not help.  Referral to GI to see if they can help with the treatment plan development.  I have encouraged her to continue increase in fiber, water, mobility/exercise.

## 2019-05-06 ENCOUNTER — Ambulatory Visit: Payer: Medicaid Other | Admitting: Family Medicine

## 2019-05-07 NOTE — Progress Notes (Signed)
Virtual Visit via Video Note  I connected with Brooke [name] on 05/13/19 at  9:10 AM EDT by a video enabled telemedicine application and verified that I am speaking with the correct person using two identifiers.   I discussed the limitations of evaluation and management by telemedicine and the availability of in person appointments. The patient expressed understanding and agreed to proceed.      I discussed the assessment and treatment plan with the patient. The patient was provided an opportunity to ask questions and all were answered. The patient agreed with the plan and demonstrated an understanding of the instructions.   The patient was advised to call back or seek an in-person evaluation if the symptoms worsen or if the condition fails to improve as anticipated.  I provided 12 minutes of non-face-to-face time during this encounter.   [name], MD     [facility] MD/PA/NP OP Progress Note  05/13/2019 9:38 AM Brooke [name]  MRN:  [id number]  Chief Complaint:  Chief Complaint    Depression; Follow-up     HPI:  -Per chart review,  she was started on venlafaxine by Dr. Glo Herring for vasomotor symptoms. Lexapro was discontinued by Ms. Jerelene Redden  This is a follow-up appointment for depression.  She states that she was started on Effexor by Dr. Glo Herring in April.  Her mood has been worse.  She has an attitude and yells at others.  She states that it was awful on Mother's Day.  She referred to an episode of calling her children, stating that they did not spend time with the patient as she was not aware of their plans.  She later apologized to them.  She states that it was not characteristic of her to do that way.  Her mother with lung cancer, and her husband have been doing well.  She uses treadmill at home.  Although she used to do it 3 times a week, she now does it twice during the week as she was found to have some knot in her knee.  She enjoys going out on the weekend.  She thinks it is  time for her cousin, who is 77-year-old to go to daycare, and being with other children.  She has been taking good care of him.  She has insomnia.  She usually wakes up at midnight and watches TV.  She agrees to improve sleep hygiene.  She feels fatigue.  She has fair energy.  She denies SI.  She feels anxious and tense at times.  She has occasional panic attacks.  She has not used clonazepam since the last visit.  She drinks 1 to 2 glasses of shots on the weekend to numb her feeling. She denies craving for alcohol. She denies any history of alcohol abuse or substance use.   Visit Diagnosis:    ICD-10-CM   1. MDD (major depressive disorder), recurrent episode, mild ([place])  F33.0     Past Psychiatric History: Please see initial evaluation for full details. I have reviewed the history. No updates at this time.     Past Medical History:  Past Medical History:  Diagnosis Date  . Acid reflux   . Anxiety   . Cervical radiculitis   . Chronic constipation   . DDD (degenerative disc disease), lumbar   . Depression   . Dizzy spells   . Facial numbness   . Heart palpitations 01/2017  . History of hiatal hernia   . Hypertension   . Insomnia   .  Iron deficiency anemia   . LUQ pain 11/19/2012  . Migraines    occ  . Near syncope 01/2017  . Numbness and tingling in left arm   . Obesity   . Panic attacks   . Sciatica   . Seizures ([place])   . Small bowel obstruction ([place]) 07/20/2017    Past Surgical History:  Procedure Laterality Date  . BUNIONECTOMY Left yrs ago  . CERVICAL ABLATION  2017  . COLONOSCOPY, ESOPHAGOGASTRODUODENOSCOPY (EGD) AND ESOPHAGEAL DILATION N/A 12/03/2012   [id number] melanosis throughout the entire examined colon/The colon IS redundant/Small internal hemorrhoids/EGD:Esophageal web/Medium sized hiatal hernia/MILD Non-erosive gastritis  . ESOPHAGOGASTRODUODENOSCOPY  03/09/09   Dr. Wilford Corner, normal EGD, s/p Bravo capsule placement  . GASTRIC ROUX-EN-Y N/A 07/16/2017    Procedure: LAPAROSCOPIC ROUX-EN-Y GASTRIC BYPASS WITH UPPER ENDOSCOPY AND ERAS PATHWAY;  Surgeon: Johnathan Hausen, MD;  Location: [facility];  Service: General;  Laterality: N/A;  . LAPAROSCOPY N/A 07/20/2017   Procedure: LAPAROSCOPY DIAGNOSTIC. REDUCTION OF SMALL BOWEL OBSTRUCTION. REPAIR OF TROCAR HERNIA.;  Surgeon: Alphonsa Overall, MD;  Location: [facility];  Service: General;  Laterality: N/A;  . TUBAL LIGATION    . WISDOM TOOTH EXTRACTION      Family Psychiatric History: Please see initial evaluation for full details. I have reviewed the history. No updates at this time.     Family History:  Family History  Problem Relation Age of Onset  . Diabetes Mother   . Hypertension Mother   . Drug abuse Mother   . Anxiety disorder Mother   . Depression Mother   . CAD Mother        CABG in 56s  . Lung cancer Mother   . Hypertension Father   . Diabetes Sister   . Hypertension Sister   . Hypertension Brother   . Drug abuse Brother   . CAD Brother        s/p CABG in 75s  . Diabetes Paternal Grandmother   . Hypertension Brother   . Drug abuse Brother   . CAD Brother        "HEart artery blockages" in 16s  . Hypertension Brother   . Drug abuse Brother   . Cancer Maternal Grandmother        breast  . Anxiety disorder Maternal Grandmother   . Depression Maternal Grandmother   . Asthma Other   . Heart disease Other   . Colon cancer Neg Hx     Social History:  Social History   Socioeconomic History  . Marital status: Married    Spouse name: Randall Hiss   . Number of children: 2  . Years of education: Not on file  . Highest education level: Some college, no degree  Occupational History  . Not on file  Tobacco Use  . Smoking status: Former Smoker    Packs/day: 0.30    Years: 23.00    Pack years: 6.90    Types: Cigarettes    Quit date: 10/04/2012    Years since quitting: 6.6  . Smokeless tobacco: Never Used  . Tobacco comment: less than 1/2 pack cigarettes daily  Substance and Sexual  Activity  . Alcohol use: Yes    Comment: once every 2-3 months  . Drug use: No  . Sexual activity: Yes    Partners: Male    Birth control/protection: Surgical  Other Topics Concern  . Not on file  Social History Narrative   Lives with husband, married 35 years    73 son Chief Executive Officer  37 son Jiles Harold -two grandchildren    Live close by    Rising cousin-custody of her daughter 38 [place]       Right handed   Pets: none      Enjoys: [employer], shopping, likes being outside       Diet: eggs, oatmeal, salad, all food groups no lot of proteins, good on veggies.    Caffeine: sweet tea-2 cups  Coffee-1 cup daily    Water: 2-3 16 oz bottles daily       Wears seat belt    Smoke and carbon monoxide detectors   Does use phone while driving but hands free   Social Determinants of Health   Financial Resource Strain: Low Risk   . Difficulty of Paying Living Expenses: Not hard at all  Food Insecurity: No Food Insecurity  . Worried About Charity fundraiser in the Last Year: Never true  . Ran Out of Food in the Last Year: Never true  Transportation Needs: No Transportation Needs  . Lack of Transportation (Medical): No  . Lack of Transportation (Non-Medical): No  Physical Activity: Inactive  . Days of Exercise per Week: 0 days  . Minutes of Exercise per Session: 0 min  Stress: Stress Concern Present  . Feeling of Stress : To some extent  Social Connections: Slightly Isolated  . Frequency of Communication with Friends and Family: More than three times a week  . Frequency of Social Gatherings with Friends and Family: More than three times a week  . Attends Religious Services: More than 4 times per year  . Active Member of Clubs or Organizations: No  . Attends Archivist Meetings: Never  . Marital Status: Married    Allergies: No Known Allergies  Metabolic Disorder Labs: No results found for: HGBA1C, MPG No results found for: PROLACTIN Lab Results  Component Value Date   CHOL 222  (H) 02/17/2019   TRIG 94 02/17/2019   HDL 58 02/17/2019   CHOLHDL 3.8 02/17/2019   VLDL 13 05/13/2012   LDLCALC 144 (H) 02/17/2019   LDLCALC 124 (H) 05/13/2012   Lab Results  Component Value Date   TSH 1.48 02/17/2019   TSH 0.823 05/13/2012    Therapeutic Level Labs: No results found for: LITHIUM No results found for: VALPROATE No components found for:  CBMZ  Current Medications: Current Outpatient Medications  Medication Sig Dispense Refill  . acetaminophen (TYLENOL) 500 MG tablet Take 1,000 mg by mouth daily as needed for moderate pain or headache.    . Biotin 10000 MCG TABS Take 10,000 mcg by mouth daily.    Marland Kitchen CALCIUM PO Take 1 capsule by mouth daily.    . Cholecalciferol (VITAMIN D) 2000 units CAPS Take 2,000 Units by mouth daily.    . hydrOXYzine (ATARAX/VISTARIL) 10 MG tablet Take 1 tablet (10 mg total) by mouth at bedtime as needed. 30 tablet 0  . levETIRAcetam (KEPPRA XR) 500 MG 24 hr tablet Take 1 tablet (500 mg total) by mouth See admin instructions. Take 500mg  twice daily (Patient taking differently: Take 1,000 mg by mouth See admin instructions. Take 1000mg  twice daily) 60 tablet   . pantoprazole (PROTONIX) 40 MG tablet Take 1 tablet (40 mg total) by mouth daily. 90 tablet 3  . topiramate (TOPAMAX) 100 MG tablet Take 100 mg by mouth 2 (two) times daily.     Marland Kitchen venlafaxine XR (EFFEXOR-XR) 150 MG 24 hr capsule Take 1 capsule (150 mg total) by mouth daily with breakfast.  30 capsule 1   No current facility-administered medications for this visit.     Musculoskeletal: Strength & Muscle Tone: N/A Gait & Station: N/A Patient leans: N/A  Psychiatric Specialty Exam: Review of Systems  Psychiatric/Behavioral: Positive for dysphoric mood and sleep disturbance. Negative for agitation, behavioral problems, confusion, decreased concentration, hallucinations, self-injury and suicidal ideas. The patient is nervous/anxious. The patient is not hyperactive.   All other systems  reviewed and are negative.   There were no vitals taken for this visit.There is no height or weight on file to calculate BMI.  General Appearance: Fairly Groomed  Eye Contact:  Good  Speech:  Clear and Coherent  Volume:  Normal  Mood:  worse  Affect:  Appropriate, Congruent and slightly down, reactive  Thought Process:  Coherent  Orientation:  Full (Time, Place, and Person)  Thought Content: Logical   Suicidal Thoughts:  No  Homicidal Thoughts:  No  Memory:  Immediate;   Good  Judgement:  Good  Insight:  Good  Psychomotor Activity:  Normal  Concentration:  Concentration: Good and Attention Span: Good  Recall:  Good  Fund of Knowledge: Good  Language: Good  Akathisia:  No  Handed:  Right  AIMS (if indicated): not done  Assets:  Communication Skills Desire for Improvement  ADL's:  Intact  Cognition: WNL  Sleep:  Poor   Screenings: GAD-7     Office Visit from 05/04/2019 in [facility]  Total GAD-7 Score  14    PHQ2-9     Office Visit from 05/04/2019 in [facility] Most recent reading at 05/04/2019  9:00 AM Office Visit from 03/05/2019 in [facility] Most recent reading at 03/05/2019 11:26 AM Video Visit from 03/05/2019 in [facility] Most recent reading at 03/05/2019  9:23 AM Office Visit from 02/05/2019 in [place] Most recent reading at 02/05/2019 10:09 AM Nutrition [facility]29/2017 [facility] Most recent reading at 05/29/2017  8:15 AM  PHQ-2 Total Score  2  5  1  1   0  PHQ-9 Total Score  8  18  12  13   -       Assessment and Plan:  Brooke [name] is a 49 y.o. year old female with a history of depression, , spells of unresponsiveness, followed by neurology, migraine, hypertension, GERD, s/pRYGB 07/2017, mild obstructive sleep apnea , who presents for follow up appointment for MDD (major depressive disorder), recurrent episode, mild ([place])  # MDD, mild, recurrent without psychotic features #  OCD There has been worsening in depressive symptoms after Lexapro was switched to venlafaxine by her OB/GYN for vasomotor symptoms.  Psychosocial stressors includes taking care of her cousin at home, unemployment, demoralization due to pain/seizure, and her mother being diagnosed with lung cancer.  We will up titrate venlafaxine to optimize it is effective for depression and anxiety.  She is advised to discuss with her OB/GYN/PCP that this writer would handle her psychotropics given patient preference and for continuity of care.  Discussed potential risk of headache and hypertension.  She has not taken clonazepam since the last visit; will hold this medication.   # Alcohol use She reports slight escalated use of alcohol on weekend; 1-2 glasses of shot per day.  She denies any craving for alcohol.  We will continue to monitor.    Plan 1. Increase venlafaxine 150 mg daily  2. Next appointment: 6/29 at 9:10 for 20 mins, video - hold clonazepam  The patient demonstrates the following risk factors for suicide: Chronic risk factors for suicide include:psychiatric disorder ofdepression, OCDand chronic pain. Acute risk factorsfor suicide include: unemployment. Protective factorsfor this patient include: positive social support, responsibility to others (children, family), coping skills and hope for the future. Considering these factors, the overall suicide risk at this point appears to below. Patientisappropriate for outpatient follow up.  [name], MD 05/13/2019, 9:38 AM

## 2019-05-13 ENCOUNTER — Other Ambulatory Visit: Payer: Self-pay

## 2019-05-13 ENCOUNTER — Telehealth (INDEPENDENT_AMBULATORY_CARE_PROVIDER_SITE_OTHER): Payer: 59 | Admitting: Psychiatry

## 2019-05-13 ENCOUNTER — Encounter (HOSPITAL_COMMUNITY): Payer: Self-pay | Admitting: Psychiatry

## 2019-05-13 DIAGNOSIS — F330 Major depressive disorder, recurrent, mild: Secondary | ICD-10-CM | POA: Diagnosis not present

## 2019-05-13 MED ORDER — VENLAFAXINE HCL ER 150 MG PO CP24: 150.0000 mg | ORAL_CAPSULE | Freq: Every day | ORAL | 1 refills | Status: DC

## 2019-05-13 NOTE — Patient Instructions (Signed)
1. Increase venlafaxine 150 mg daily  2. Next appointment: 6/29 at 9:10

## 2019-05-22 ENCOUNTER — Other Ambulatory Visit: Payer: Self-pay | Admitting: *Deleted

## 2019-05-22 DIAGNOSIS — F321 Major depressive disorder, single episode, moderate: Secondary | ICD-10-CM

## 2019-05-22 DIAGNOSIS — Z72820 Sleep deprivation: Secondary | ICD-10-CM

## 2019-05-22 MED ORDER — HYDROXYZINE HCL 10 MG PO TABS: 10.0000 mg | ORAL_TABLET | Freq: Every evening | ORAL | 0 refills | Status: DC | PRN

## 2019-05-26 NOTE — Progress Notes (Signed)
Primary Care Physician:  Perlie Mayo, NP  Primary Gastroenterologist:  Formerly Barney Drain, MD   Chief Complaint  Patient presents with  . Constipation    BM's every 1-2 weeks, hard BM, straining, no bleeding, gastric bypass in 07/2017  . Nausea    all the time but no vomiting  . trouble with food    feels sometimes it is getting stuck on chest    HPI:  Brooke [name] is a 49 y.o. female here at the request of Cherly Beach, NP for chronic constipation.   Patient last seen in 2014 at time of EGD/colonsocopy. See [place] for details.   Chronic constipation for 29 years. BM once per two weeks.Has to strain. Taking Linzess 70mcg daily. Tried fiber but bloating. MOM will give BM just that day. Has tried Miralax but quit working. No melena, brbpr. On pantoprazole once per day for heartburn and still has heartburn about twice per week. Nausea but no vomiting. When eats, feels like food gets stuck in the chest. Patient has gained 20 pounds in the past year. Gastric bypass in 2019 and lost 65 pounds.     Current Outpatient Medications  Medication Sig Dispense Refill  . acetaminophen (TYLENOL) 500 MG tablet Take 1,000 mg by mouth daily as needed for moderate pain or headache.    . Biotin 10000 MCG TABS Take 5,000 mcg by mouth daily.     Marland Kitchen CALCIUM PO Take 1 capsule by mouth daily.    . Cholecalciferol (VITAMIN D) 2000 units CAPS Take 2,000 Units by mouth daily.    . hydrOXYzine (ATARAX/VISTARIL) 10 MG tablet Take 1 tablet (10 mg total) by mouth at bedtime as needed. 30 tablet 0  . levETIRAcetam (KEPPRA XR) 500 MG 24 hr tablet Take 1 tablet (500 mg total) by mouth See admin instructions. Take 500mg  twice daily (Patient taking differently: Take 250 mg by mouth in the morning and at bedtime. ) 60 tablet   . linaclotide (LINZESS) 72 MCG capsule Take 72 mcg by mouth daily before breakfast.    . Multiple Vitamins-Minerals (BARIATRIC MULTIVITAMINS/IRON PO) Take by mouth daily.    . pantoprazole  (PROTONIX) 40 MG tablet Take 1 tablet (40 mg total) by mouth daily. 90 tablet 3  . topiramate (TOPAMAX) 100 MG tablet Take 100 mg by mouth 2 (two) times daily.     Marland Kitchen venlafaxine XR (EFFEXOR-XR) 150 MG 24 hr capsule Take 1 capsule (150 mg total) by mouth daily with breakfast. 30 capsule 1   No current facility-administered medications for this visit.    Allergies as of 05/27/2019  . (No Known Allergies)    Past Medical History:  Diagnosis Date  . Acid reflux   . Anxiety   . Cervical radiculitis   . Chronic constipation   . DDD (degenerative disc disease), lumbar   . Depression   . Dizzy spells   . Facial numbness   . Heart palpitations 01/2017  . History of hiatal hernia   . Hypertension   . Insomnia   . Iron deficiency anemia   . LUQ pain 11/19/2012  . Migraines    occ  . Near syncope 01/2017  . Numbness and tingling in left arm   . Obesity   . Panic attacks   . Sciatica   . Seizures ([place])   . Small bowel obstruction ([place]) 07/20/2017    Past Surgical History:  Procedure Laterality Date  . BUNIONECTOMY Left yrs ago  . CERVICAL ABLATION  2017  .  COLONOSCOPY, ESOPHAGOGASTRODUODENOSCOPY (EGD) AND ESOPHAGEAL DILATION N/A 12/03/2012   [id number] melanosis throughout the entire examined colon/The colon IS redundant/Small internal hemorrhoids/EGD:Esophageal web/Medium sized hiatal hernia/MILD Non-erosive gastritis  . ESOPHAGOGASTRODUODENOSCOPY  03/09/09   Dr. Wilford Corner, normal EGD, s/p Bravo capsule placement  . GASTRIC ROUX-EN-Y N/A 07/16/2017   Procedure: LAPAROSCOPIC ROUX-EN-Y GASTRIC BYPASS WITH UPPER ENDOSCOPY AND ERAS PATHWAY;  Surgeon: Johnathan Hausen, MD;  Location: [facility];  Service: General;  Laterality: N/A;  . LAPAROSCOPY N/A 07/20/2017   Procedure: LAPAROSCOPY DIAGNOSTIC. REDUCTION OF SMALL BOWEL OBSTRUCTION. REPAIR OF TROCAR HERNIA.;  Surgeon: Alphonsa Overall, MD;  Location: [facility];  Service: General;  Laterality: N/A;  . TUBAL LIGATION    . WISDOM TOOTH  EXTRACTION      Family History  Problem Relation Age of Onset  . Diabetes Mother   . Hypertension Mother   . Drug abuse Mother   . Anxiety disorder Mother   . Depression Mother   . CAD Mother        CABG in 76s  . Lung cancer Mother   . Hypertension Father   . Diabetes Sister   . Hypertension Sister   . Hypertension Brother   . Drug abuse Brother   . CAD Brother        s/p CABG in 51s  . Diabetes Paternal Grandmother   . Hypertension Brother   . Drug abuse Brother   . CAD Brother        "HEart artery blockages" in 46s  . Hypertension Brother   . Drug abuse Brother   . Anxiety disorder Maternal Grandmother   . Depression Maternal Grandmother   . Breast cancer Maternal Grandmother        breast  . Asthma Other   . Heart disease Other   . Colon cancer Neg Hx     Social History   Socioeconomic History  . Marital status: Married    Spouse name: Randall Hiss   . Number of children: 2  . Years of education: Not on file  . Highest education level: Some college, no degree  Occupational History  . Not on file  Tobacco Use  . Smoking status: Former Smoker    Packs/day: 0.30    Years: 23.00    Pack years: 6.90    Types: Cigarettes    Quit date: 10/04/2012    Years since quitting: 6.6  . Smokeless tobacco: Never Used  . Tobacco comment: less than 1/2 pack cigarettes daily  Substance and Sexual Activity  . Alcohol use: Yes    Comment: once every 2-3 months  . Drug use: No  . Sexual activity: Yes    Partners: Male    Birth control/protection: Surgical  Other Topics Concern  . Not on file  Social History Narrative   Lives with husband, married 73 years    74 son Teron    72 son Jiles Harold -two grandchildren    Live close by    Rising cousin-custody of her daughter 48 [address]       Right handed   Pets: none      Enjoys: [employer], shopping, likes being outside       Diet: eggs, oatmeal, salad, all food groups no lot of proteins, good on veggies.    Caffeine: sweet tea-2  cups  Coffee-1 cup daily    Water: 2-3 16 oz bottles daily       Wears seat belt    Smoke and carbon monoxide detectors   Does  use phone while driving but hands free   Social Determinants of Health   Financial Resource Strain: Low Risk   . Difficulty of Paying Living Expenses: Not hard at all  Food Insecurity: No Food Insecurity  . Worried About Charity fundraiser in the Last Year: Never true  . Ran Out of Food in the Last Year: Never true  Transportation Needs: No Transportation Needs  . Lack of Transportation (Medical): No  . Lack of Transportation (Non-Medical): No  Physical Activity: Inactive  . Days of Exercise per Week: 0 days  . Minutes of Exercise per Session: 0 min  Stress: Stress Concern Present  . Feeling of Stress : To some extent  Social Connections: Slightly Isolated  . Frequency of Communication with Friends and Family: More than three times a week  . Frequency of Social Gatherings with Friends and Family: More than three times a week  . Attends Religious Services: More than 4 times per year  . Active Member of Clubs or Organizations: No  . Attends Archivist Meetings: Never  . Marital Status: Married  Human resources officer Violence: Not At Risk  . Fear of Current or Ex-Partner: No  . Emotionally Abused: No  . Physically Abused: No  . Sexually Abused: No      ROS:  General: Negative for anorexia, weight loss, fever, chills, fatigue, weakness. Eyes: Negative for vision changes.  ENT: Negative for hoarseness,  nasal congestion. See hpi CV: Negative for chest pain, angina, palpitations, dyspnea on exertion, peripheral edema.  Respiratory: Negative for dyspnea at rest, dyspnea on exertion, cough, sputum, wheezing.  GI: See history of present illness. GU:  Negative for dysuria, hematuria, urinary incontinence, urinary frequency, nocturnal urination.  MS: Negative for joint pain, low back pain.  Derm: Negative for rash or itching.  Neuro: Negative for  weakness, abnormal sensation, seizure, frequent headaches, memory loss, confusion.  Psych: Negative for anxiety, depression, suicidal ideation, hallucinations.  Endo: Negative for unusual weight change.  Heme: Negative for bruising or bleeding. Allergy: Negative for rash or hives.    Physical Examination:  BP 123/80   Pulse 68   Temp (!) 97.3 F (36.3 C) (Oral)   Ht 5\' 2"  (1.575 m)   Wt 209 lb 12.8 oz (95.2 kg)   LMP 05/18/2019   BMI 38.37 kg/m    General: Well-nourished, well-developed in no acute distress.  Head: Normocephalic, atraumatic.   Eyes: Conjunctiva pink, no icterus. Mouth: masked Neck: Supple without thyromegaly, masses, or lymphadenopathy.  Lungs: Clear to auscultation bilaterally.  Heart: Regular rate and rhythm, no murmurs rubs or gallops.  Abdomen: Bowel sounds are normal, nontender, nondistended, no hepatosplenomegaly or masses, no abdominal bruits or    hernia , no rebound or guarding.   Rectal: not performed Extremities: No lower extremity edema. No clubbing or deformities.  Neuro: Alert and oriented x 4 , grossly normal neurologically.  Skin: Warm and dry, no rash or jaundice.   Psych: Alert and cooperative, normal mood and affect.  Labs: Lab Results  Component Value Date   CREATININE 0.83 02/17/2019   BUN 12 02/17/2019   NA 141 02/17/2019   K 4.3 02/17/2019   CL 110 02/17/2019   CO2 24 02/17/2019   Lab Results  Component Value Date   WBC 4.1 02/17/2019   HGB 13.4 02/17/2019   HCT 40.3 02/17/2019   MCV 92.0 02/17/2019   PLT 319 02/17/2019   Lab Results  Component Value Date   ALT 39 (H)  02/17/2019   AST 29 02/17/2019   ALKPHOS 78 10/13/2018   BILITOT 0.3 02/17/2019   Lab Results  Component Value Date   TSH 1.48 02/17/2019     Imaging Studies: No results found.

## 2019-05-26 NOTE — H&P (View-Only) (Signed)
Primary Care Physician:  Perlie Mayo, NP  Primary Gastroenterologist:  Formerly Barney Drain, MD   Chief Complaint  Patient presents with  . Constipation    BM's every 1-2 weeks, hard BM, straining, no bleeding, gastric bypass in 07/2017  . Nausea    all the time but no vomiting  . trouble with food    feels sometimes it is getting stuck on chest    HPI:  Brooke [name] is a 49 y.o. female here at the request of Cherly Beach, NP for chronic constipation.   Patient last seen in 2014 at time of EGD/colonsocopy. See [place] for details.   Chronic constipation for 29 years. BM once per two weeks.Has to strain. Taking Linzess 43mcg daily. Tried fiber but bloating. MOM will give BM just that day. Has tried Miralax but quit working. No melena, brbpr. On pantoprazole once per day for heartburn and still has heartburn about twice per week. Nausea but no vomiting. When eats, feels like food gets stuck in the chest. Patient has gained 20 pounds in the past year. Gastric bypass in 2019 and lost 65 pounds.     Current Outpatient Medications  Medication Sig Dispense Refill  . acetaminophen (TYLENOL) 500 MG tablet Take 1,000 mg by mouth daily as needed for moderate pain or headache.    . Biotin 10000 MCG TABS Take 5,000 mcg by mouth daily.     Marland Kitchen CALCIUM PO Take 1 capsule by mouth daily.    . Cholecalciferol (VITAMIN D) 2000 units CAPS Take 2,000 Units by mouth daily.    . hydrOXYzine (ATARAX/VISTARIL) 10 MG tablet Take 1 tablet (10 mg total) by mouth at bedtime as needed. 30 tablet 0  . levETIRAcetam (KEPPRA XR) 500 MG 24 hr tablet Take 1 tablet (500 mg total) by mouth See admin instructions. Take 500mg  twice daily (Patient taking differently: Take 250 mg by mouth in the morning and at bedtime. ) 60 tablet   . linaclotide (LINZESS) 72 MCG capsule Take 72 mcg by mouth daily before breakfast.    . Multiple Vitamins-Minerals (BARIATRIC MULTIVITAMINS/IRON PO) Take by mouth daily.    . pantoprazole  (PROTONIX) 40 MG tablet Take 1 tablet (40 mg total) by mouth daily. 90 tablet 3  . topiramate (TOPAMAX) 100 MG tablet Take 100 mg by mouth 2 (two) times daily.     Marland Kitchen venlafaxine XR (EFFEXOR-XR) 150 MG 24 hr capsule Take 1 capsule (150 mg total) by mouth daily with breakfast. 30 capsule 1   No current facility-administered medications for this visit.    Allergies as of 05/27/2019  . (No Known Allergies)    Past Medical History:  Diagnosis Date  . Acid reflux   . Anxiety   . Cervical radiculitis   . Chronic constipation   . DDD (degenerative disc disease), lumbar   . Depression   . Dizzy spells   . Facial numbness   . Heart palpitations 01/2017  . History of hiatal hernia   . Hypertension   . Insomnia   . Iron deficiency anemia   . LUQ pain 11/19/2012  . Migraines    occ  . Near syncope 01/2017  . Numbness and tingling in left arm   . Obesity   . Panic attacks   . Sciatica   . Seizures ([place])   . Small bowel obstruction ([place]) 07/20/2017    Past Surgical History:  Procedure Laterality Date  . BUNIONECTOMY Left yrs ago  . CERVICAL ABLATION  2017  .  COLONOSCOPY, ESOPHAGOGASTRODUODENOSCOPY (EGD) AND ESOPHAGEAL DILATION N/A 12/03/2012   [id number] melanosis throughout the entire examined colon/The colon IS redundant/Small internal hemorrhoids/EGD:Esophageal web/Medium sized hiatal hernia/MILD Non-erosive gastritis  . ESOPHAGOGASTRODUODENOSCOPY  03/09/09   Dr. Wilford Corner, normal EGD, s/p Bravo capsule placement  . GASTRIC ROUX-EN-Y N/A 07/16/2017   Procedure: LAPAROSCOPIC ROUX-EN-Y GASTRIC BYPASS WITH UPPER ENDOSCOPY AND ERAS PATHWAY;  Surgeon: Johnathan Hausen, MD;  Location: [facility];  Service: General;  Laterality: N/A;  . LAPAROSCOPY N/A 07/20/2017   Procedure: LAPAROSCOPY DIAGNOSTIC. REDUCTION OF SMALL BOWEL OBSTRUCTION. REPAIR OF TROCAR HERNIA.;  Surgeon: Alphonsa Overall, MD;  Location: [facility];  Service: General;  Laterality: N/A;  . TUBAL LIGATION    . WISDOM TOOTH  EXTRACTION      Family History  Problem Relation Age of Onset  . Diabetes Mother   . Hypertension Mother   . Drug abuse Mother   . Anxiety disorder Mother   . Depression Mother   . CAD Mother        CABG in 40s  . Lung cancer Mother   . Hypertension Father   . Diabetes Sister   . Hypertension Sister   . Hypertension Brother   . Drug abuse Brother   . CAD Brother        s/p CABG in 1s  . Diabetes Paternal Grandmother   . Hypertension Brother   . Drug abuse Brother   . CAD Brother        "HEart artery blockages" in 32s  . Hypertension Brother   . Drug abuse Brother   . Anxiety disorder Maternal Grandmother   . Depression Maternal Grandmother   . Breast cancer Maternal Grandmother        breast  . Asthma Other   . Heart disease Other   . Colon cancer Neg Hx     Social History   Socioeconomic History  . Marital status: Married    Spouse name: Randall Hiss   . Number of children: 2  . Years of education: Not on file  . Highest education level: Some college, no degree  Occupational History  . Not on file  Tobacco Use  . Smoking status: Former Smoker    Packs/day: 0.30    Years: 23.00    Pack years: 6.90    Types: Cigarettes    Quit date: 10/04/2012    Years since quitting: 6.6  . Smokeless tobacco: Never Used  . Tobacco comment: less than 1/2 pack cigarettes daily  Substance and Sexual Activity  . Alcohol use: Yes    Comment: once every 2-3 months  . Drug use: No  . Sexual activity: Yes    Partners: Male    Birth control/protection: Surgical  Other Topics Concern  . Not on file  Social History Narrative   Lives with husband, married 62 years    61 son Teron    56 son Jiles Harold -two grandchildren    Live close by    Rising cousin-custody of her daughter 24 [address]       Right handed   Pets: none      Enjoys: [employer], shopping, likes being outside       Diet: eggs, oatmeal, salad, all food groups no lot of proteins, good on veggies.    Caffeine: sweet tea-2  cups  Coffee-1 cup daily    Water: 2-3 16 oz bottles daily       Wears seat belt    Smoke and carbon monoxide detectors   Does  use phone while driving but hands free   Social Determinants of Health   Financial Resource Strain: Low Risk   . Difficulty of Paying Living Expenses: Not hard at all  Food Insecurity: No Food Insecurity  . Worried About Charity fundraiser in the Last Year: Never true  . Ran Out of Food in the Last Year: Never true  Transportation Needs: No Transportation Needs  . Lack of Transportation (Medical): No  . Lack of Transportation (Non-Medical): No  Physical Activity: Inactive  . Days of Exercise per Week: 0 days  . Minutes of Exercise per Session: 0 min  Stress: Stress Concern Present  . Feeling of Stress : To some extent  Social Connections: Slightly Isolated  . Frequency of Communication with Friends and Family: More than three times a week  . Frequency of Social Gatherings with Friends and Family: More than three times a week  . Attends Religious Services: More than 4 times per year  . Active Member of Clubs or Organizations: No  . Attends Archivist Meetings: Never  . Marital Status: Married  Human resources officer Violence: Not At Risk  . Fear of Current or Ex-Partner: No  . Emotionally Abused: No  . Physically Abused: No  . Sexually Abused: No      ROS:  General: Negative for anorexia, weight loss, fever, chills, fatigue, weakness. Eyes: Negative for vision changes.  ENT: Negative for hoarseness,  nasal congestion. See hpi CV: Negative for chest pain, angina, palpitations, dyspnea on exertion, peripheral edema.  Respiratory: Negative for dyspnea at rest, dyspnea on exertion, cough, sputum, wheezing.  GI: See history of present illness. GU:  Negative for dysuria, hematuria, urinary incontinence, urinary frequency, nocturnal urination.  MS: Negative for joint pain, low back pain.  Derm: Negative for rash or itching.  Neuro: Negative for  weakness, abnormal sensation, seizure, frequent headaches, memory loss, confusion.  Psych: Negative for anxiety, depression, suicidal ideation, hallucinations.  Endo: Negative for unusual weight change.  Heme: Negative for bruising or bleeding. Allergy: Negative for rash or hives.    Physical Examination:  BP 123/80   Pulse 68   Temp (!) 97.3 F (36.3 C) (Oral)   Ht 5\' 2"  (1.575 m)   Wt 209 lb 12.8 oz (95.2 kg)   LMP 05/18/2019   BMI 38.37 kg/m    General: Well-nourished, well-developed in no acute distress.  Head: Normocephalic, atraumatic.   Eyes: Conjunctiva pink, no icterus. Mouth: masked Neck: Supple without thyromegaly, masses, or lymphadenopathy.  Lungs: Clear to auscultation bilaterally.  Heart: Regular rate and rhythm, no murmurs rubs or gallops.  Abdomen: Bowel sounds are normal, nontender, nondistended, no hepatosplenomegaly or masses, no abdominal bruits or    hernia , no rebound or guarding.   Rectal: not performed Extremities: No lower extremity edema. No clubbing or deformities.  Neuro: Alert and oriented x 4 , grossly normal neurologically.  Skin: Warm and dry, no rash or jaundice.   Psych: Alert and cooperative, normal mood and affect.  Labs: Lab Results  Component Value Date   CREATININE 0.83 02/17/2019   BUN 12 02/17/2019   NA 141 02/17/2019   K 4.3 02/17/2019   CL 110 02/17/2019   CO2 24 02/17/2019   Lab Results  Component Value Date   WBC 4.1 02/17/2019   HGB 13.4 02/17/2019   HCT 40.3 02/17/2019   MCV 92.0 02/17/2019   PLT 319 02/17/2019   Lab Results  Component Value Date   ALT 39 (H)  02/17/2019   AST 29 02/17/2019   ALKPHOS 78 10/13/2018   BILITOT 0.3 02/17/2019   Lab Results  Component Value Date   TSH 1.48 02/17/2019     Imaging Studies: No results found.

## 2019-05-27 ENCOUNTER — Other Ambulatory Visit: Payer: Self-pay

## 2019-05-27 ENCOUNTER — Encounter: Payer: Self-pay | Admitting: Gastroenterology

## 2019-05-27 ENCOUNTER — Ambulatory Visit (INDEPENDENT_AMBULATORY_CARE_PROVIDER_SITE_OTHER): Payer: 59 | Admitting: Gastroenterology

## 2019-05-27 VITALS — BP 123/80 | HR 68 | Temp 97.3°F | Ht 62.0 in | Wt 209.8 lb

## 2019-05-27 DIAGNOSIS — K219 Gastro-esophageal reflux disease without esophagitis: Secondary | ICD-10-CM

## 2019-05-27 DIAGNOSIS — R1319 Other dysphagia: Secondary | ICD-10-CM

## 2019-05-27 DIAGNOSIS — R1310 Dysphagia, unspecified: Secondary | ICD-10-CM

## 2019-05-27 DIAGNOSIS — K5909 Other constipation: Secondary | ICD-10-CM

## 2019-05-27 MED ORDER — LINACLOTIDE 290 MCG PO CAPS: 290.0000 ug | ORAL_CAPSULE | Freq: Every day | ORAL | 3 refills | Status: DC

## 2019-05-27 MED ORDER — OMEPRAZOLE 20 MG PO CPDR
20.0000 mg | DELAYED_RELEASE_CAPSULE | Freq: Every day | ORAL | 3 refills | Status: DC
Start: 2019-05-27 — End: 2019-08-06

## 2019-05-27 NOTE — Assessment & Plan Note (Signed)
GERD has been difficulty to manage and complaining of solid food dysphagia. She has history of medium sized hiatal hernia which is likely contributing and cannot rule out recurrent esophageal web or a stricture. Recommend switch PPI to omeprazole 20mg  daily. Plan for EGD with possible esophageal dilation in the near future.  I have discussed the risks, alternatives, benefits with regards to but not limited to the risk of reaction to medication, bleeding, infection, perforation and the patient is agreeable to proceed. Written consent to be obtained.   ASA II

## 2019-05-27 NOTE — Assessment & Plan Note (Signed)
Increase Linzess to 237mcg daily on empty stomach. If no improvement, we will switch bowel regimen. Discussed need to drink at least 100 ounces of noncaffeinated fluids per day. Increase exercise and dietary fiber.

## 2019-05-27 NOTE — Patient Instructions (Signed)
1. Stop pantoprazole. Start omeprazole 20mg  daily before breakfast.  2. Stop Linzess 105mcg and start Linzess 233mcg daily before breakfast.  3. Upper endoscopy to evaluate your swallowing concerns and poorly controlled reflux. See separate instructions.

## 2019-05-28 ENCOUNTER — Telehealth: Payer: Self-pay

## 2019-05-28 NOTE — Telephone Encounter (Signed)
PA for EGD submitted via [employer] website. Case pending. Ref# [id number].

## 2019-06-02 ENCOUNTER — Other Ambulatory Visit: Payer: Self-pay

## 2019-06-02 NOTE — Telephone Encounter (Signed)
EGD approved. Request case# [id number], valid 06/24/19-09/22/19.

## 2019-06-15 ENCOUNTER — Telehealth: Payer: Self-pay

## 2019-06-15 NOTE — Telephone Encounter (Signed)
[place] at pre-service center sent message. Pt needs PA for DIL scheduled for 06/24/19 with [facility] (EGD has already been authorized).  PA for DIL submitted via [employer] website. Ref# [id number].  Message sent to [place].

## 2019-06-15 NOTE — Telephone Encounter (Signed)
Message sent to [place] to inform her PA was received.

## 2019-06-15 NOTE — Telephone Encounter (Signed)
Received fax from [facility], Dilation approved. Request case# [id number], 06/24/19-09/22/19.

## 2019-06-23 ENCOUNTER — Other Ambulatory Visit (HOSPITAL_COMMUNITY)
Admission: RE | Admit: 2019-06-23 | Discharge: 2019-06-23 | Disposition: A | Discharge status: Home / Self Care | Payer: 59 | Source: Ambulatory Visit | Attending: Internal Medicine | Admitting: Internal Medicine

## 2019-06-23 ENCOUNTER — Other Ambulatory Visit: Payer: Self-pay

## 2019-06-23 DIAGNOSIS — Z01812 Encounter for preprocedural laboratory examination: Secondary | ICD-10-CM | POA: Insufficient documentation

## 2019-06-23 DIAGNOSIS — Z20822 Contact with and (suspected) exposure to covid-19: Secondary | ICD-10-CM | POA: Diagnosis not present

## 2019-06-23 LAB — SARS CORONAVIRUS 2 (TAT 6-24 HRS): SARS Coronavirus 2: NEGATIVE

## 2019-06-24 ENCOUNTER — Encounter (HOSPITAL_COMMUNITY): Payer: Self-pay | Admitting: Internal Medicine

## 2019-06-24 ENCOUNTER — Other Ambulatory Visit: Payer: Self-pay

## 2019-06-24 ENCOUNTER — Ambulatory Visit (HOSPITAL_COMMUNITY)
Admission: RE | Admit: 2019-06-24 | Discharge: 2019-06-24 | Disposition: A | Discharge status: Home / Self Care | Payer: 59 | Attending: Internal Medicine | Admitting: Internal Medicine

## 2019-06-24 ENCOUNTER — Encounter (HOSPITAL_COMMUNITY)
Admission: RE | Disposition: A | Discharge status: Home / Self Care | Payer: Self-pay | Source: Home / Self Care | Attending: Internal Medicine

## 2019-06-24 DIAGNOSIS — Z79899 Other long term (current) drug therapy: Secondary | ICD-10-CM | POA: Diagnosis not present

## 2019-06-24 DIAGNOSIS — I10 Essential (primary) hypertension: Secondary | ICD-10-CM | POA: Diagnosis not present

## 2019-06-24 DIAGNOSIS — K219 Gastro-esophageal reflux disease without esophagitis: Secondary | ICD-10-CM | POA: Insufficient documentation

## 2019-06-24 DIAGNOSIS — R1310 Dysphagia, unspecified: Secondary | ICD-10-CM

## 2019-06-24 DIAGNOSIS — Z8249 Family history of ischemic heart disease and other diseases of the circulatory system: Secondary | ICD-10-CM | POA: Insufficient documentation

## 2019-06-24 DIAGNOSIS — F419 Anxiety disorder, unspecified: Secondary | ICD-10-CM | POA: Insufficient documentation

## 2019-06-24 DIAGNOSIS — Z6838 Body mass index (BMI) 38.0-38.9, adult: Secondary | ICD-10-CM | POA: Insufficient documentation

## 2019-06-24 DIAGNOSIS — Z9884 Bariatric surgery status: Secondary | ICD-10-CM | POA: Diagnosis not present

## 2019-06-24 DIAGNOSIS — E669 Obesity, unspecified: Secondary | ICD-10-CM | POA: Diagnosis not present

## 2019-06-24 DIAGNOSIS — Z87891 Personal history of nicotine dependence: Secondary | ICD-10-CM | POA: Insufficient documentation

## 2019-06-24 DIAGNOSIS — F329 Major depressive disorder, single episode, unspecified: Secondary | ICD-10-CM | POA: Insufficient documentation

## 2019-06-24 DIAGNOSIS — Z8719 Personal history of other diseases of the digestive system: Secondary | ICD-10-CM | POA: Insufficient documentation

## 2019-06-24 DIAGNOSIS — Z818 Family history of other mental and behavioral disorders: Secondary | ICD-10-CM | POA: Insufficient documentation

## 2019-06-24 HISTORY — PX: ESOPHAGOGASTRODUODENOSCOPY: SHX5428

## 2019-06-24 HISTORY — PX: MALONEY DILATION: SHX5535

## 2019-06-24 SURGERY — EGD (ESOPHAGOGASTRODUODENOSCOPY)
Anesthesia: Moderate Sedation

## 2019-06-24 MED ORDER — LIDOCAINE VISCOUS HCL 2 % MT SOLN
OROMUCOSAL | Status: AC
  Filled 2019-06-24: qty 15

## 2019-06-24 MED ORDER — ONDANSETRON HCL 4 MG/2ML IJ SOLN
INTRAMUSCULAR | Status: DC | PRN
  Administered 2019-06-24: 4 mg via INTRAVENOUS

## 2019-06-24 MED ORDER — MIDAZOLAM HCL 5 MG/5ML IJ SOLN
INTRAMUSCULAR | Status: AC
  Filled 2019-06-24: qty 10

## 2019-06-24 MED ORDER — LIDOCAINE VISCOUS HCL 2 % MT SOLN
OROMUCOSAL | Status: DC | PRN
  Administered 2019-06-24: 1 via OROMUCOSAL

## 2019-06-24 MED ORDER — MEPERIDINE HCL 50 MG/ML IJ SOLN
INTRAMUSCULAR | Status: AC
  Filled 2019-06-24: qty 1

## 2019-06-24 MED ORDER — MIDAZOLAM HCL 5 MG/5ML IJ SOLN
INTRAMUSCULAR | Status: DC | PRN
  Administered 2019-06-24 (×2): 1 mg via INTRAVENOUS
  Administered 2019-06-24 (×2): 2 mg via INTRAVENOUS
  Administered 2019-06-24 (×2): 1 mg via INTRAVENOUS

## 2019-06-24 MED ORDER — ONDANSETRON HCL 4 MG/2ML IJ SOLN
INTRAMUSCULAR | Status: AC
  Filled 2019-06-24: qty 2

## 2019-06-24 MED ORDER — MEPERIDINE HCL 100 MG/ML IJ SOLN
INTRAMUSCULAR | Status: DC | PRN
  Administered 2019-06-24 (×2): 25 mg

## 2019-06-24 MED ORDER — SODIUM CHLORIDE 0.9 % IV SOLN: INTRAVENOUS | Status: DC

## 2019-06-24 NOTE — Progress Notes (Signed)
Virtual Visit via Video Note  I connected with Brooke [name] on 06/30/19 at  9:10 AM EDT by a video enabled telemedicine application and verified that I am speaking with the correct person using two identifiers.   I discussed the limitations of evaluation and management by telemedicine and the availability of in person appointments. The patient expressed understanding and agreed to proceed.     I discussed the assessment and treatment plan with the patient. The patient was provided an opportunity to ask questions and all were answered. The patient agreed with the plan and demonstrated an understanding of the instructions.   The patient was advised to call back or seek an in-person evaluation if the symptoms worsen or if the condition fails to improve as anticipated.  Location: patient- in the hospital, provider- home office   I provided 15 minutes of non-face-to-face time during this encounter.   [name], MD    [facility] MD/PA/NP OP Progress Note  06/30/2019 9:39 AM [name]  MRN:  [id number]  Chief Complaint:  Chief Complaint    Follow-up; Depression; Anxiety     HPI:  This is a follow-up appointment for depression and anxiety.  She states that she is currently in the hospital as her husband was admitted since last night for cardiac evaluation.  She also states that her husband and uncle, who was very close to him deceased last week.  Although patient has been trying to handle things the best she can, it has become more difficult. She feels exhausted during the day.  She feels anxious and tense. She had a panic attack last week, and took Klonopin.  She has middle insomnia.  She feels fatigue.  She has good concentration and appetite.  She denies SI.  She drunk a cup of liquor last weekend. She denies any drink since then. She has craving for alcohol. She has not noticed any difference since starting venlafaxine.   Visit Diagnosis:    ICD-10-CM   1. MDD (major depressive  disorder), recurrent episode, mild ([place])  F33.0   2. Panic attacks  F41.0   3. Uncomplicated alcohol dependence ([place])  F10.20     Past Psychiatric History: Please see initial evaluation for full details. I have reviewed the history. No updates at this time.     Past Medical History:  Past Medical History:  Diagnosis Date  . Acid reflux   . Anxiety   . Cervical radiculitis   . Chronic constipation   . DDD (degenerative disc disease), lumbar   . Depression   . Dizzy spells   . Facial numbness   . Heart palpitations 01/2017  . History of hiatal hernia   . Hypertension   . Insomnia   . Iron deficiency anemia   . LUQ pain 11/19/2012  . Migraines    occ  . Near syncope 01/2017  . Numbness and tingling in left arm   . Obesity   . Panic attacks   . Sciatica   . Seizures ([place])   . Small bowel obstruction ([place]) 07/20/2017    Past Surgical History:  Procedure Laterality Date  . BUNIONECTOMY Left yrs ago  . CERVICAL ABLATION  2017  . COLONOSCOPY, ESOPHAGOGASTRODUODENOSCOPY (EGD) AND ESOPHAGEAL DILATION N/A 12/03/2012   DPO:EUMPNTIR melanosis throughout the entire examined colon/The colon IS redundant/Small internal hemorrhoids/EGD:Esophageal web/Medium sized hiatal hernia/MILD Non-erosive gastritis  . ESOPHAGOGASTRODUODENOSCOPY  03/09/09   Dr. Wilford Corner, normal EGD, s/p Bravo capsule placement  . ESOPHAGOGASTRODUODENOSCOPY N/A 06/24/2019  Procedure: ESOPHAGOGASTRODUODENOSCOPY (EGD);  Surgeon: Daneil Dolin, MD;  [facility];  Service: Endoscopy;  Laterality: N/A;  2:00pm  . GASTRIC ROUX-EN-Y N/A 07/16/2017   Procedure: LAPAROSCOPIC ROUX-EN-Y GASTRIC BYPASS WITH UPPER ENDOSCOPY AND ERAS PATHWAY;  Surgeon: Johnathan Hausen, MD;  Location: [facility];  Service: General;  Laterality: N/A;  . LAPAROSCOPY N/A 07/20/2017   Procedure: LAPAROSCOPY DIAGNOSTIC. REDUCTION OF SMALL BOWEL OBSTRUCTION. REPAIR OF TROCAR HERNIA.;  Surgeon: Alphonsa Overall, MD;  Location: [facility];  Service:  General;  Laterality: N/A;  Venia Minks DILATION N/A 06/24/2019   Procedure: Keturah Shavers;  Surgeon: Daneil Dolin, MD;  Location: [facility];  Service: Endoscopy;  Laterality: N/A;  . TUBAL LIGATION    . WISDOM TOOTH EXTRACTION      Family Psychiatric History: Please see initial evaluation for full details. I have reviewed the history. No updates at this time.     Family History:  Family History  Problem Relation Age of Onset  . Diabetes Mother   . Hypertension Mother   . Drug abuse Mother   . Anxiety disorder Mother   . Depression Mother   . CAD Mother        CABG in 74s  . Lung cancer Mother   . Hypertension Father   . Diabetes Sister   . Hypertension Sister   . Hypertension Brother   . Drug abuse Brother   . CAD Brother        s/p CABG in 56s  . Diabetes Paternal Grandmother   . Hypertension Brother   . Drug abuse Brother   . CAD Brother        "HEart artery blockages" in 1s  . Hypertension Brother   . Drug abuse Brother   . Anxiety disorder Maternal Grandmother   . Depression Maternal Grandmother   . Breast cancer Maternal Grandmother        breast  . Asthma Other   . Heart disease Other   . Colon cancer Neg Hx     Social History:  Social History   Socioeconomic History  . Marital status: Married    Spouse name: Randall Hiss   . Number of children: 2  . Years of education: Not on file  . Highest education level: Some college, no degree  Occupational History  . Not on file  Tobacco Use  . Smoking status: Former Smoker    Packs/day: 0.30    Years: 23.00    Pack years: 6.90    Types: Cigarettes    Quit date: 10/04/2012    Years since quitting: 6.7  . Smokeless tobacco: Never Used  . Tobacco comment: less than 1/2 pack cigarettes daily  Vaping Use  . Vaping Use: Never used  Substance and Sexual Activity  . Alcohol use: Yes    Comment: once every 2-3 months  . Drug use: No  . Sexual activity: Yes    Partners: Male    Birth control/protection:  Surgical  Other Topics Concern  . Not on file  Social History Narrative   Lives with husband, married 88 years    38 son Teron    64 son Jiles Harold -two grandchildren    Live close by    Rising cousin-custody of her daughter 67 [address]       Right handed   Pets: none      Enjoys: [employer], shopping, likes being outside       Diet: eggs, oatmeal, salad, all food groups no lot  of proteins, good on veggies.    Caffeine: sweet tea-2 cups  Coffee-1 cup daily    Water: 2-3 16 oz bottles daily       Wears seat belt    Smoke and carbon monoxide detectors   Does use phone while driving but hands free   Social Determinants of Health   Financial Resource Strain: Low Risk   . Difficulty of Paying Living Expenses: Not hard at all  Food Insecurity: No Food Insecurity  . Worried About Charity fundraiser in the Last Year: Never true  . Ran Out of Food in the Last Year: Never true  Transportation Needs: No Transportation Needs  . Lack of Transportation (Medical): No  . Lack of Transportation (Non-Medical): No  Physical Activity: Inactive  . Days of Exercise per Week: 0 days  . Minutes of Exercise per Session: 0 min  Stress: Stress Concern Present  . Feeling of Stress : To some extent  Social Connections: Moderately Integrated  . Frequency of Communication with Friends and Family: More than three times a week  . Frequency of Social Gatherings with Friends and Family: More than three times a week  . Attends Religious Services: More than 4 times per year  . Active Member of Clubs or Organizations: No  . Attends Archivist Meetings: Never  . Marital Status: Married    Allergies: No Known Allergies  Metabolic Disorder Labs: No results found for: HGBA1C, MPG No results found for: PROLACTIN Lab Results  Component Value Date   CHOL 222 (H) 02/17/2019   TRIG 94 02/17/2019   HDL 58 02/17/2019   CHOLHDL 3.8 02/17/2019   VLDL 13 05/13/2012   LDLCALC 144 (H) 02/17/2019   LDLCALC 124  (H) 05/13/2012   Lab Results  Component Value Date   TSH 1.48 02/17/2019   TSH 0.823 05/13/2012    Therapeutic Level Labs: No results found for: LITHIUM No results found for: VALPROATE No components found for:  CBMZ  Current Medications: Current Outpatient Medications  Medication Sig Dispense Refill  . acetaminophen (TYLENOL) 500 MG tablet Take 1,000 mg by mouth daily as needed for moderate pain or headache.    . Biotin 10000 MCG TABS Take 5,000 mcg by mouth every other day.     . calcium carbonate (OSCAL) 1500 (600 Ca) MG TABS tablet Take 600 mg by mouth daily.    . Cholecalciferol (VITAMIN D3) 50 MCG (2000 UT) TABS Take 8,000 Units by mouth daily.    . diclofenac Sodium (VOLTAREN) 1 % GEL Apply 1 g topically 3 (three) times daily as needed for pain.    . hydrOXYzine (ATARAX/VISTARIL) 10 MG tablet Take 1 tablet (10 mg total) by mouth at bedtime as needed. (Patient taking differently: Take 10 mg by mouth at bedtime. ) 30 tablet 0  . hydrOXYzine (ATARAX/VISTARIL) 25 MG tablet Take 1 tablet (25 mg total) by mouth daily as needed for anxiety. 30 tablet 0  . levETIRAcetam (KEPPRA) 250 MG tablet Take 250 mg by mouth in the morning and at bedtime.    Marland Kitchen linaclotide (LINZESS) 290 MCG CAPS capsule Take 1 capsule (290 mcg total) by mouth daily before breakfast. 90 capsule 3  . Multiple Vitamins-Minerals (BARIATRIC MULTIVITAMINS/IRON PO) Take 1 tablet by mouth daily.     . naltrexone (DEPADE) 50 MG tablet Take 0.5 tablets (25 mg total) by mouth daily. 15 tablet 1  . omeprazole (PRILOSEC) 20 MG capsule Take 1 capsule (20 mg total) by mouth daily before  breakfast. 90 capsule 3  . topiramate (TOPAMAX) 100 MG tablet Take 100 mg by mouth 2 (two) times daily.     Marland Kitchen venlafaxine XR (EFFEXOR-XR) 150 MG 24 hr capsule Total of 225 mg along with 75 mg tab 90 capsule 0  . venlafaxine XR (EFFEXOR-XR) 75 MG 24 hr capsule Total of 225 mg along with 150 mg tab 90 capsule 0   No current facility-administered  medications for this visit.     Musculoskeletal: Strength & Muscle Tone: N/A Gait & Station: N/A Patient leans: N/A  Psychiatric Specialty Exam: Review of Systems  Psychiatric/Behavioral: Positive for dysphoric mood and sleep disturbance. Negative for agitation, behavioral problems, confusion, decreased concentration, hallucinations, self-injury and suicidal ideas. The patient is nervous/anxious. The patient is not hyperactive.   All other systems reviewed and are negative.   There were no vitals taken for this visit.There is no height or weight on file to calculate BMI.  General Appearance: Fairly Groomed  Eye Contact:  Good  Speech:  Clear and Coherent  Volume:  Normal  Mood:  Anxious  Affect:  Appropriate, Congruent and slightly tense  Thought Process:  Coherent  Orientation:  Full (Time, Place, and Person)  Thought Content: Logical   Suicidal Thoughts:  No  Homicidal Thoughts:  No  Memory:  Immediate;   Good  Judgement:  Good  Insight:  Good  Psychomotor Activity:  Normal  Concentration:  Concentration: Good and Attention Span: Good  Recall:  Good  Fund of Knowledge: Good  Language: Good  Akathisia:  No  Handed:  Right  AIMS (if indicated): not done  Assets:  Communication Skills Desire for Improvement  ADL's:  Intact  Cognition: WNL  Sleep:  Poor   Screenings: GAD-7     Office Visit from 05/04/2019 in [facility]  Total GAD-7 Score 14    PHQ2-9     Office Visit from 05/04/2019 in [facility] Most recent reading at 05/04/2019  9:00 AM Office Visit from 03/05/2019 in [facility] Most recent reading at 03/05/2019 11:26 AM Video Visit from 03/05/2019 in [place] Most recent reading at 03/05/2019  9:23 AM Office Visit from 02/05/2019 in [place] [facility] Most recent reading at 02/05/2019 10:09 AM [facility]/2017 [facility] Most recent reading at 05/29/2017  8:15 AM  PHQ-2 Total Score  2 5 1 1  0  PHQ-9 Total Score 8 18 12 13  --       Assessment and Plan:  Brooke [name] is a 49 y.o. year old female with a history of depression, ,spells of unresponsiveness, followed by neurology, migraine, hypertension, GERD, s/pRYGB 07/2017, mild obstructive sleep apnea, who presents for follow up appointment for below.   1. MDD (major depressive disorder), recurrent episode, mild ([place]) 2. Panic attacks She reports worsening in panic attacks and anxiety in the context of loss of her husband's uncle, and her husband being admitted for cardiac evaluation since last night.  Other psychosocial stressors includes taking care of her cousin at home, unemployment, demoralization due to pain/seizure, and her mother being diagnosed with lung cancer.   Will do further up titration of venlafaxine to optimize its benefit for depression and anxiety.  She agrees to hold clonazepam at this time given her alcohol use, which could potentially cause respiratory suppression.  Will start hydroxyzine as needed for anxiety.   3. Uncomplicated alcohol dependence ([place]) She has sporadic alcohol overuse, and complains of craving.  We  will start naltrexone for abstinence.  Discussed potential risk of GI side effect and liver function abnormality.  We will plan to obtain lab at the next visit.   Plan 1.Increase venlafaxine 225 mg daily  2. Start naltrexone 25 mg at night  3. Start hydroxyzine 25 mg daily as needed for anxiety 4. Next appointment: 8/10 at [facility] for 20 mins, video - hold clonazepam  - She had PSG in 2019; IMPRESSION: 1. Mild Obstructive Sleep Apnea at AHI 4.2 /h - not enough to need intervention (OSA), 2. Moderate Severe Periodic Limb Movement Disorder (PLMD), 3. Normal REM latency.   The patient demonstrates the following risk factors for suicide: Chronic risk factors for suicide include:psychiatric disorder ofdepression, OCDand chronic pain. Acute risk factorsfor suicide include: unemployment.  Protective factorsfor this patient include: positive social support, responsibility to others (children, family), coping skills and hope for the future. Considering these factors, the overall suicide risk at this point appears to below. Patientisappropriate for outpatient follow up.  [name], MD 06/30/2019, 9:39 AM

## 2019-06-24 NOTE — Discharge Instructions (Signed)
EGD Discharge instructions Please read the instructions outlined below and refer to this sheet in the next few weeks. These discharge instructions provide you with general information on caring for yourself after you leave the hospital. Your doctor may also give you specific instructions. While your treatment has been planned according to the most current medical practices available, unavoidable complications occasionally occur. If you have any problems or questions after discharge, please call your doctor. ACTIVITY  You may resume your regular activity but move at a slower pace for the next 24 hours.   Take frequent rest periods for the next 24 hours.   Walking will help expel (get rid of) the air and reduce the bloated feeling in your abdomen.   No driving for 24 hours (because of the anesthesia (medicine) used during the test).   You may shower.   Do not sign any important legal documents or operate any machinery for 24 hours (because of the anesthesia used during the test).  NUTRITION  Drink plenty of fluids.   You may resume your normal diet.   Begin with a light meal and progress to your normal diet.   Avoid alcoholic beverages for 24 hours or as instructed by your caregiver.  MEDICATIONS  You may resume your normal medications unless your caregiver tells you otherwise.  WHAT YOU CAN EXPECT TODAY  You may experience abdominal discomfort such as a feeling of fullness or "gas" pains.  FOLLOW-UP  Your doctor will discuss the results of your test with you.  SEEK IMMEDIATE MEDICAL ATTENTION IF ANY OF THE FOLLOWING OCCUR:  Excessive nausea (feeling sick to your stomach) and/or vomiting.   Severe abdominal pain and distention (swelling).   Trouble swallowing.   Temperature over 101 F (37.8 C).   Rectal bleeding or vomiting of blood.    Your esophagus was stretched today  Use Chloraseptic spray for any transient sore throat you may experience related to getting your  esophagus stretched.  Stop Prilosec; resume Protonix 40 mg but take it twice daily-new prescription provided  Office visit with [place] in 2 months  At patient patient request, I spoke to Ruthine Dose at [phone] guarding results and recommendations  At patient request, I called Ruthine Dose at [phone]   Esophageal Dilatation Esophageal dilatation, also called esophageal dilation, is a procedure to widen or open (dilate) a blocked or narrowed part of the esophagus. The esophagus is the part of the body that moves food and liquid from the mouth to the stomach. You may need this procedure if:  You have a buildup of scar tissue in your esophagus that makes it difficult, painful, or impossible to swallow. This can be caused by gastroesophageal reflux disease (GERD).  You have cancer of the esophagus.  There is a problem with how food moves through your esophagus. In some cases, you may need this procedure repeated at a later time to dilate the esophagus gradually. Tell a health care provider about:  Any allergies you have.  All medicines you are taking, including vitamins, herbs, eye drops, creams, and over-the-counter medicines.  Any problems you or family members have had with anesthetic medicines.  Any blood disorders you have.  Any surgeries you have had.  Any medical conditions you have.  Any antibiotic medicines you are required to take before dental procedures.  Whether you are pregnant or may be pregnant. What are the risks? Generally, this is a safe procedure. However, problems may occur, including:  Bleeding due to a tear  in the lining of the esophagus.  A hole (perforation) in the esophagus. What happens before the procedure?  Follow instructions from your health care provider about eating or drinking restrictions.  Ask your health care provider about changing or stopping your regular medicines. This is especially important if you are taking diabetes medicines or  blood thinners.  Plan to have someone take you home from the hospital or clinic.  Plan to have a responsible adult care for you for at least 24 hours after you leave the hospital or clinic. This is important. What happens during the procedure?  You may be given a medicine to help you relax (sedative).  A numbing medicine may be sprayed into the back of your throat, or you may gargle the medicine.  Your health care provider may perform the dilatation using various surgical instruments, such as: ? Simple dilators. This instrument is carefully placed in the esophagus to stretch it. ? Guided wire bougies. This involves using an endoscope to insert a wire into the esophagus. A dilator is passed over this wire to enlarge the esophagus. Then the wire is removed. ? Balloon dilators. An endoscope with a small balloon at the end is inserted into the esophagus. The balloon is inflated to stretch the esophagus and open it up. The procedure may vary among health care providers and hospitals. What happens after the procedure?  Your blood pressure, heart rate, breathing rate, and blood oxygen level will be monitored until the medicines you were given have worn off.  Your throat may feel slightly sore and numb. This will improve slowly over time.  You will not be allowed to eat or drink until your throat is no longer numb.  When you are able to drink, urinate, and sit on the edge of the bed without nausea or dizziness, you may be able to return home. Follow these instructions at home:  Take over-the-counter and prescription medicines only as told by your health care provider.  Do not drive for 24 hours if you were given a sedative during your procedure.  You should have a responsible adult with you for 24 hours after the procedure.  Follow instructions from your health care provider about any eating or drinking restrictions.  Do not use any products that contain nicotine or tobacco, such as  cigarettes and e-cigarettes. If you need help quitting, ask your health care provider.  Keep all follow-up visits as told by your health care provider. This is important. Get help right away if you:  Have a fever.  Have chest pain.  Have pain that is not relieved by medication.  Have trouble breathing.  Have trouble swallowing.  Vomit blood. Summary  Esophageal dilatation, also called esophageal dilation, is a procedure to widen or open (dilate) a blocked or narrowed part of the esophagus.  Plan to have someone take you home from the hospital or clinic.  For this procedure, a numbing medicine may be sprayed into the back of your throat, or you may gargle the medicine.  Do not drive for 24 hours if you were given a sedative during your procedure. This information is not intended to replace advice given to you by your health care provider. Make sure you discuss any questions you have with your health care provider. Document Revised: 10/15/2018 Document Reviewed: 10/23/2016 Elsevier Patient Education  2020 [employer].

## 2019-06-24 NOTE — Op Note (Signed)
[facility] Patient Name: Brooke [name] Procedure Date: 06/24/2019 1:07 PM MRN: [id number] Date of Birth: 1970-06-27 Attending MD: Norvel Richards , MD CSN: [id number] Age: 49 Admit Type: Outpatient Procedure:                Upper GI endoscopy Indications:              Dysphagia Providers:                Norvel Richards, MD, Lurline Del, RN, Crystal                            Page, Randa Spike, Technician Referring MD:              Medicines:                Midazolam 8 mg IV, Meperidine 50 mg IV Complications:            No immediate complications. Estimated Blood Loss:     Estimated blood loss: none. Procedure:                Pre-Anesthesia Assessment:                           - Prior to the procedure, a History and Physical                            was performed, and patient medications and                            allergies were reviewed. The patient's tolerance of                            previous anesthesia was also reviewed. The risks                            and benefits of the procedure and the sedation                            options and risks were discussed with the patient.                            All questions were answered, and informed consent                            was obtained. Prior Anticoagulants: The patient has                            taken no previous anticoagulant or antiplatelet                            agents. ASA Grade Assessment: II - A patient with                            mild systemic disease. After reviewing the risks  and benefits, the patient was deemed in                            satisfactory condition to undergo the procedure.                           After obtaining informed consent, the endoscope was                            passed under direct vision. Throughout the                            procedure, the patient's blood pressure, pulse, and                             oxygen saturations were monitored continuously. The                            [id number]-[id number] ([id number]) was introduced through the                            mouth, and advanced to the second part of duodenum.                            The upper GI endoscopy was accomplished without                            difficulty. The patient tolerated the procedure                            well. Scope In: 1:54:29 PM Scope Out: 2:01:31 PM Total Procedure Duration: 0 hours 7 minutes 2 seconds  Findings:      The examined esophagus was normal. Status post prior gastric bypass       surgery. Single effernt small bowel limb; residual gastric mucosa and       small bowel limb appeared normal. The scope was withdrawn. Dilation was       performed with a Maloney dilator with mild resistance at 35 Fr. The       dilation site was examined following endoscope reinsertion and showed no       change. Estimated blood loss: none. Impression:               - Normal esophagus. . Dilated. Status post prior                            gastric bypass surgery                           - No specimens collected. Patient states Prilosec                            not nearly as good as Protonix is controlling                            reflux symptoms.  Moderate Sedation:      Moderate (conscious) sedation was personally administered by an       anesthesia professional. The following parameters were monitored: oxygen       saturation, heart rate, blood pressure, respiratory rate, EKG, adequacy       of pulmonary ventilation, and response to care. Recommendation:           - Patient has a contact number available for                            emergencies. The signs and symptoms of potential                            delayed complications were discussed with the                            patient. Return to normal activities tomorrow.                            Written discharge instructions were provided to the                             patient.                           - Advance diet as tolerated.                           - Continue present medications stop Prilosec and                            resume Protonix at a dose of 40 mg twice daily.                            Prescription provided..                           - Return to my office in 2 months. Procedure Code(s):        --- Professional ---                           [phone], Esophagogastroduodenoscopy, flexible,                            transoral; diagnostic, including collection of                            specimen(s) by brushing or washing, when performed                            (separate procedure)                           [id number], Dilation of esophagus, by unguided sound or  bougie, single or multiple passes Diagnosis Code(s):        --- Professional ---                           R13.10, Dysphagia, unspecified CPT copyright 2019 American Medical Association. All rights reserved. The codes documented in this report are preliminary and upon coder review may  be revised to meet current compliance requirements. Brooke [name]. Brooke Chisom, MD Norvel Richards, MD 06/24/2019 2:14:13 PM This report has been signed electronically. Number of Addenda: 0

## 2019-06-24 NOTE — Interval H&P Note (Signed)
History and Physical Interval Note:  06/24/2019 1:37 PM  Brooke [name]  has presented today for surgery, with the diagnosis of GERD, dysphagia.  The various methods of treatment have been discussed with the patient and family. After consideration of risks, benefits and other options for treatment, the patient has consented to  Procedure(s) with comments: ESOPHAGOGASTRODUODENOSCOPY (EGD) (N/A) - 2:00pm [place] (N/A) as a surgical intervention.  The patient's history has been reviewed, patient examined, no change in status, stable for surgery.  I have reviewed the patient's chart and labs.  Questions were answered to the patient's satisfaction.     Brooke [name]  No change.  Protonix 40 mg daily worked better for controlling reflux and Prilosec 20 mg daily.  EGD with esophageal dilation as feasible/appropriate per plan today. The risks, benefits, limitations, alternatives and imponderables have been reviewed with the patient. Potential for esophageal dilation, biopsy, etc. have also been reviewed.  Questions have been answered. All parties agreeable.

## 2019-06-29 ENCOUNTER — Encounter (HOSPITAL_COMMUNITY): Payer: Self-pay | Admitting: Internal Medicine

## 2019-06-30 ENCOUNTER — Other Ambulatory Visit: Payer: Self-pay

## 2019-06-30 ENCOUNTER — Encounter (HOSPITAL_COMMUNITY): Payer: Self-pay | Admitting: Psychiatry

## 2019-06-30 ENCOUNTER — Telehealth (INDEPENDENT_AMBULATORY_CARE_PROVIDER_SITE_OTHER): Payer: 59 | Admitting: Psychiatry

## 2019-06-30 DIAGNOSIS — F410 Panic disorder [episodic paroxysmal anxiety] without agoraphobia: Secondary | ICD-10-CM | POA: Diagnosis not present

## 2019-06-30 DIAGNOSIS — F1020 Alcohol dependence, uncomplicated: Secondary | ICD-10-CM | POA: Diagnosis not present

## 2019-06-30 DIAGNOSIS — F330 Major depressive disorder, recurrent, mild: Secondary | ICD-10-CM | POA: Diagnosis not present

## 2019-06-30 MED ORDER — VENLAFAXINE HCL ER 75 MG PO CP24: ORAL_CAPSULE | ORAL | 0 refills | Status: DC

## 2019-06-30 MED ORDER — VENLAFAXINE HCL ER 150 MG PO CP24: ORAL_CAPSULE | ORAL | 0 refills | Status: DC

## 2019-06-30 MED ORDER — HYDROXYZINE HCL 25 MG PO TABS: 25.0000 mg | ORAL_TABLET | Freq: Every day | ORAL | 0 refills | Status: DC | PRN

## 2019-06-30 MED ORDER — NALTREXONE HCL 50 MG PO TABS: 25.0000 mg | ORAL_TABLET | Freq: Every day | ORAL | 1 refills | Status: DC

## 2019-06-30 NOTE — Patient Instructions (Signed)
1.Increase venlafaxine 225 mg daily  2. Start naltrexone 25 mg at night  3. Start hydroxyzine 25 mg daily as needed for anxiety 4. Next appointment: 8/10 at [place]

## 2019-07-08 ENCOUNTER — Other Ambulatory Visit: Payer: Self-pay | Admitting: *Deleted

## 2019-07-08 DIAGNOSIS — F321 Major depressive disorder, single episode, moderate: Secondary | ICD-10-CM

## 2019-07-08 DIAGNOSIS — Z72820 Sleep deprivation: Secondary | ICD-10-CM

## 2019-07-08 MED ORDER — HYDROXYZINE HCL 10 MG PO TABS: 10.0000 mg | ORAL_TABLET | Freq: Every evening | ORAL | 0 refills | Status: DC | PRN

## 2019-07-09 ENCOUNTER — Ambulatory Visit: Payer: BC Managed Care – PPO | Admitting: Adult Health

## 2019-08-05 NOTE — Progress Notes (Signed)
Virtual Visit via Video Note  I connected with Brooke [name] on 08/11/19 at 10:00 AM EDT by a video enabled telemedicine application and verified that I am speaking with the correct person using two identifiers.   I discussed the limitations of evaluation and management by telemedicine and the availability of in person appointments. The patient expressed understanding and agreed to proceed.    I discussed the assessment and treatment plan with the patient. The patient was provided an opportunity to ask questions and all were answered. The patient agreed with the plan and demonstrated an understanding of the instructions.   The patient was advised to call back or seek an in-person evaluation if the symptoms worsen or if the condition fails to improve as anticipated.  Location: patient- home, provider- home office   I provided 15 minutes of non-face-to-face time during this encounter.   [name], MD  [facility] MD/PA/NP OP Progress Note  08/11/2019 10:22 AM Brooke [name]  MRN:  [id number]  Chief Complaint:  Chief Complaint    Depression; Follow-up; Anxiety     HPI:  This is a follow-up appointment for depression, anxiety and alcohol dependence.  She states that she has been doing better, and feels calmer after up titration of venlafaxine.  Her husband has been doing well.  He had an open heart surgery.  She reports good relationship with her children.  She was found to have LFT abnormality at the recent visit with PCP.  Although she initially reports her preference to stay on the current medication as things has been going very well for her, she understands to discontinue naltrexone given its potential effect on the LFT abnormality.  She sleeps better after starting hydroxyzine.  She feels depressed occasionally, although it has been improving.  She has fair motivation and energy.  She has good appetite.  She gained weight, which she attributes to recent alcohol use.  She denies SI.   She feels anxious and tense at times.  She had 1 panic attack since the last visit.  She used to drink on Friday and Saturday; two mixed drink on each day. She has not drink for the past two weeks since the blood test. She denies craving for alcohol. She found naltrexone to be helpful to cut down alcohol use.    Wt Readings from Last 3 Encounters:  08/06/19 216 lb 12.8 oz (98.3 kg)  06/24/19 190 lb (86.2 kg)  05/27/19 209 lb 12.8 oz (95.2 kg)    Visit Diagnosis:    ICD-10-CM   1. MDD (major depressive disorder), recurrent, in partial remission ([place])  F33.41   2. Panic attacks  F41.0   3. Uncomplicated alcohol dependence ([place])  F10.20     Past Psychiatric History: Please see initial evaluation for full details. I have reviewed the history. No updates at this time.     Past Medical History:  Past Medical History:  Diagnosis Date  . Acid reflux   . Amenorrhea 02/06/2012  . Anxiety   . Breast lump 08/06/2019  . Cervical radiculitis   . Chronic constipation   . DDD (degenerative disc disease), lumbar   . Depression   . Dizzy spells   . Elevated vitamin B12 level 05/04/2019  . Esophageal dysphagia 11/19/2012  . Facial numbness   . Headache(784.0) 04/01/2012  . Heart palpitations 01/2017  . History of hiatal hernia   . Hypertension   . Insomnia   . Intractable migraine with visual aura and without status  migrainosus 01/23/2017  . Iron deficiency anemia   . Irregular periods 08/06/2019  . LUQ pain 11/19/2012  . Menopausal symptom 08/06/2019  . Migraines    occ  . Near syncope 01/2017  . Numbness and tingling 10/08/2016   Formatting of this note might be different from the original. ---Oct 2018-TEE----Normal left ventricular size and systolic function with no appreciable segmental abnormality. EF 60% There was no evidence of spontaneous echo contrast or thrombus in the left atrium or left atrial appendage. No significant valvular abnormalites noted Bubble study performed, this is negative.   . Numbness and tingling in left arm   . Obesity   . Other malaise and fatigue 05/19/2012  . Panic attacks   . Sciatica   . Seizures ([place])   . Slurred speech 11/07/2016   Formatting of this note might be different from the original. ---Oct 2018-TEE----Normal left ventricular size and systolic function with no appreciable segmental abnormality. EF 60% There was no evidence of spontaneous echo contrast or thrombus in the left atrium or left atrial appendage. No significant valvular abnormalites noted Bubble study performed, this is negative.  . Small bowel obstruction ([place]) 07/20/2017  . Spells of speech arrest 01/23/2017  . Transient cerebral ischemia 10/08/2016   Formatting of this note might be different from the original. ---Oct 2018-TEE----Normal left ventricular size and systolic function with no appreciable segmental abnormality. EF 60% There was no evidence of spontaneous echo contrast or thrombus in the left atrium or left atrial appendage. No significant valvular abnormalites noted Bubble study performed, this is negative.  . Word finding difficulty 01/23/2017    Past Surgical History:  Procedure Laterality Date  . BUNIONECTOMY Left yrs ago  . CERVICAL ABLATION  2017  . COLONOSCOPY, ESOPHAGOGASTRODUODENOSCOPY (EGD) AND ESOPHAGEAL DILATION N/A 12/03/2012   VOZ:DGUYQIHK melanosis throughout the entire examined colon/The colon IS redundant/Small internal hemorrhoids/EGD:Esophageal web/Medium sized hiatal hernia/MILD Non-erosive gastritis  . ESOPHAGOGASTRODUODENOSCOPY  03/09/09   Dr. Wilford Corner, normal EGD, s/p Bravo capsule placement  . ESOPHAGOGASTRODUODENOSCOPY N/A 06/24/2019   Procedure: ESOPHAGOGASTRODUODENOSCOPY (EGD);  Surgeon: Daneil Dolin, MD;  [facility];  Service: Endoscopy;  Laterality: N/A;  2:00pm  . GASTRIC ROUX-EN-Y N/A 07/16/2017   Procedure: LAPAROSCOPIC ROUX-EN-Y GASTRIC BYPASS WITH UPPER ENDOSCOPY AND ERAS PATHWAY;  Surgeon: Johnathan Hausen, MD;  Location:  [facility] OR[facility];  Service: General;  Laterality: N/A;  . LAPAROSCOPY N/A 07/20/2017   Procedure: LAPAROSCOPY DIAGNOSTIC. REDUCTION OF SMALL BOWEL OBSTRUCTION. REPAIR OF TROCAR HERNIA.;  Surgeon: Alphonsa Overall, MD;  Location: [facility];  Service: General;  Laterality: N/A;  Venia Minks DILATION N/A 06/24/2019   Procedure: Keturah Shavers;  Surgeon: Daneil Dolin, MD;  Location: [facility];  Service: Endoscopy;  Laterality: N/A;  . TUBAL LIGATION    . WISDOM TOOTH EXTRACTION      Family Psychiatric History: Please see initial evaluation for full details. I have reviewed the history. No updates at this time.     Family History:  Family History  Problem Relation Age of Onset  . Diabetes Mother   . Hypertension Mother   . Drug abuse Mother   . Anxiety disorder Mother   . Depression Mother   . CAD Mother        CABG in 102s  . Lung cancer Mother   . Hypertension Father   . Diabetes Sister   . Hypertension Sister   . Hypertension Brother   . Drug abuse Brother   . CAD Brother  s/p CABG in 76s  . Diabetes Paternal Grandmother   . Hypertension Brother   . Drug abuse Brother   . CAD Brother        "HEart artery blockages" in 73s  . Hypertension Brother   . Drug abuse Brother   . Anxiety disorder Maternal Grandmother   . Depression Maternal Grandmother   . Breast cancer Maternal Grandmother        breast  . Asthma Other   . Heart disease Other   . Colon cancer Neg Hx     Social History:  Social History   Socioeconomic History  . Marital status: Married    Spouse name: Randall Hiss   . Number of children: 2  . Years of education: Not on file  . Highest education level: Some college, no degree  Occupational History  . Not on file  Tobacco Use  . Smoking status: Former Smoker    Packs/day: 0.30    Years: 23.00    Pack years: 6.90    Types: Cigarettes    Quit date: 10/04/2012    Years since quitting: 6.8  . Smokeless tobacco: Never Used  . Tobacco comment: less than 1/2 pack  cigarettes daily  Vaping Use  . Vaping Use: Never used  Substance and Sexual Activity  . Alcohol use: Yes    Comment: once every 2-3 months  . Drug use: No  . Sexual activity: Yes    Partners: Male    Birth control/protection: Surgical  Other Topics Concern  . Not on file  Social History Narrative   Lives with husband, married 52 years    44 son Teron    26 son Jiles Harold -two grandchildren    Live close by    Rising cousin-custody of her daughter 1 Cassidy       Right handed   Pets: none      Enjoys: [employer], shopping, likes being outside       Diet: eggs, oatmeal, salad, all food groups no lot of proteins, good on veggies.    Caffeine: sweet tea-2 cups  Coffee-1 cup daily    Water: 2-3 16 oz bottles daily       Wears seat belt    Smoke and carbon monoxide detectors   Does use phone while driving but hands free   Social Determinants of Health   Financial Resource Strain: Low Risk   . Difficulty of Paying Living Expenses: Not hard at all  Food Insecurity: No Food Insecurity  . Worried About Charity fundraiser in the Last Year: Never true  . Ran Out of Food in the Last Year: Never true  Transportation Needs: No Transportation Needs  . Lack of Transportation (Medical): No  . Lack of Transportation (Non-Medical): No  Physical Activity: Inactive  . Days of Exercise per Week: 0 days  . Minutes of Exercise per Session: 0 min  Stress: Stress Concern Present  . Feeling of Stress : To some extent  Social Connections: Moderately Integrated  . Frequency of Communication with Friends and Family: More than three times a week  . Frequency of Social Gatherings with Friends and Family: More than three times a week  . Attends Religious Services: More than 4 times per year  . Active Member of Clubs or Organizations: No  . Attends Archivist Meetings: Never  . Marital Status: Married    Allergies: No Known Allergies  Metabolic Disorder Labs: Lab Results  Component Value  Date  HGBA1C 5.3 08/05/2019   MPG 105 08/05/2019   No results found for: PROLACTIN Lab Results  Component Value Date   CHOL 206 (H) 08/05/2019   TRIG 89 08/05/2019   HDL 60 08/05/2019   CHOLHDL 3.4 08/05/2019   VLDL 13 05/13/2012   LDLCALC 127 (H) 08/05/2019   LDLCALC 144 (H) 02/17/2019   Lab Results  Component Value Date   TSH 1.48 02/17/2019   TSH 0.823 05/13/2012    Therapeutic Level Labs: No results found for: LITHIUM No results found for: VALPROATE No components found for:  CBMZ  Current Medications: Current Outpatient Medications  Medication Sig Dispense Refill  . acetaminophen (TYLENOL) 500 MG tablet Take 1,000 mg by mouth daily as needed for moderate pain or headache.    . Biotin 10000 MCG TABS Take 5,000 mcg by mouth every other day.     . calcium carbonate (OSCAL) 1500 (600 Ca) MG TABS tablet Take 600 mg by mouth daily.    . Cholecalciferol (VITAMIN D3) 50 MCG (2000 UT) TABS Take 8,000 Units by mouth daily.    . diclofenac Sodium (VOLTAREN) 1 % GEL Apply 1 g topically 3 (three) times daily as needed for pain.    . hydrOXYzine (ATARAX/VISTARIL) 10 MG tablet Take 1 tablet (10 mg total) by mouth at bedtime as needed. 30 tablet 0  . [START ON 09/10/2019] hydrOXYzine (ATARAX/VISTARIL) 25 MG tablet Take 1 tablet (25 mg total) by mouth daily as needed for anxiety. 30 tablet 0  . levETIRAcetam (KEPPRA) 250 MG tablet Take 250 mg by mouth in the morning and at bedtime.    Marland Kitchen linaclotide (LINZESS) 290 MCG CAPS capsule Take 1 capsule (290 mcg total) by mouth daily before breakfast. 90 capsule 3  . Multiple Vitamins-Minerals (BARIATRIC MULTIVITAMINS/IRON PO) Take 1 tablet by mouth daily.     . pantoprazole (PROTONIX) 40 MG tablet Take 40 mg by mouth daily.    Marland Kitchen topiramate (TOPAMAX) 100 MG tablet Take 100 mg by mouth 2 (two) times daily.     Marland Kitchen venlafaxine XR (EFFEXOR-XR) 150 MG 24 hr capsule Total of 225 mg along with 75 mg tab 90 capsule 0  . venlafaxine XR (EFFEXOR-XR) 37.5 MG  24 hr capsule TAKE 2 CAPSULES(75 MG) BY MOUTH DAILY 30 capsule 5  . venlafaxine XR (EFFEXOR-XR) 75 MG 24 hr capsule Total of 225 mg along with 150 mg tab 90 capsule 0  . Vitamin D, Ergocalciferol, (DRISDOL) 1.25 MG (50000 UNIT) CAPS capsule Take 1 capsule (50,000 Units total) by mouth every 7 (seven) days. 12 capsule 1   No current facility-administered medications for this visit.     Musculoskeletal: Strength & Muscle Tone: N/A Gait & Station: N/A Patient leans: N/A  Psychiatric Specialty Exam: Review of Systems  Psychiatric/Behavioral: Positive for dysphoric mood. Negative for agitation, behavioral problems, confusion, decreased concentration, hallucinations, self-injury, sleep disturbance and suicidal ideas. The patient is nervous/anxious. The patient is not hyperactive.   All other systems reviewed and are negative.   There were no vitals taken for this visit.There is no height or weight on file to calculate BMI.  General Appearance: Fairly Groomed  Eye Contact:  Good  Speech:  Clear and Coherent  Volume:  Normal  Mood:  calmer  Affect:  Appropriate, Congruent and Full Range  Thought Process:  Coherent  Orientation:  Full (Time, Place, and Person)  Thought Content: Logical   Suicidal Thoughts:  No  Homicidal Thoughts:  No  Memory:  Immediate;   Good  Judgement:  Good  Insight:  Good  Psychomotor Activity:  Normal  Concentration:  Concentration: Good and Attention Span: Good  Recall:  Good  Fund of Knowledge: Good  Language: Good  Akathisia:  No  Handed:  Right  AIMS (if indicated): not done  Assets:  Communication Skills Desire for Improvement  ADL's:  Intact  Cognition: WNL  Sleep:  Good   Screenings: GAD-7     Video Visit from 08/06/2019 in [facility] Office Visit from 05/04/2019 in [facility]  Total GAD-7 Score 16 14    PHQ2-9     Video Visit from 08/06/2019 in [facility] Most recent reading at 08/06/2019  8:48 AM Office  Visit from 05/04/2019 in [place] Most recent reading at 05/04/2019  9:00 AM Office Visit from 03/05/2019 in [place] Most recent reading at 03/05/2019 11:26 AM Video Visit from 03/05/2019 in [place] Most recent reading at 03/05/2019  9:23 AM Office Visit from 02/05/2019 in [place] Most recent reading at 02/05/2019 10:09 AM  PHQ-2 Total Score 1 2 5 1 1   PHQ-9 Total Score 7 8 18 12 13        Assessment and Plan:  Brooke [name] is a 49 y.o. year old female with a history of depression,,spells of unresponsiveness, followed by neurology, migraine, hypertension, GERD, s/pRYGB 07/2017, mild obstructive sleep apnea, who presents for follow up appointment for below.   1. MDD (major depressive disorder), recurrent, in partial remission ([place]) 2. Panic attacks She reports significant improvement in depressive symptoms and panic attacks since up titration of venlafaxine.  Psychosocial stressors includes loss of her husband's uncle,  taking care of her cousin at home,unemployment,demoralization due to pain/seizure,and her mother being diagnosed with lung cancer.  Will continue current dose of venlafaxine to target depression and panic attacks.  Will continue hydroxyzine as needed for anxiety.   3. Uncomplicated alcohol dependence ([place]) Although she reports significant benefit from naltrexone, there is an increase in LFT. Although it is unclear whether this is attributable to naltrexone, will hold it at this time to minimize potential side effect. Will consider acamprosate in the future/after the cause of LFT is identified.   Plan 1.Continue venlafaxine 225 mg daily  2. Discontinue naltrexone 25 mg at night  3. Continue hydroxyzine 25 mg daily as needed for anxiety 4. Next appointment: 9/28 at 10:20 for 30 mins, video - hold clonazepam - She had PSG in 2019; IMPRESSION: 1. Mild Obstructive Sleep Apnea at AHI 4.2 /h - not enough to need intervention  (OSA), 2. Moderate Severe Periodic Limb Movement Disorder (PLMD), 3. Normal REM latency.   The patient demonstrates the following risk factors for suicide: Chronic risk factors for suicide include:psychiatric disorder ofdepression, OCDand chronic pain. Acute risk factorsfor suicide include: unemployment. Protective factorsfor this patient include: positive social support, responsibility to others (children, family), coping skills and hope for the future. Considering these factors, the overall suicide risk at this point appears to below. Patientisappropriate for outpatient follow up.  [name], MD 08/11/2019, 10:22 AM

## 2019-08-06 ENCOUNTER — Other Ambulatory Visit (HOSPITAL_COMMUNITY)
Admission: AD | Admit: 2019-08-06 | Discharge: 2019-08-06 | Disposition: A | Discharge status: Home / Self Care | Payer: 59 | Source: Skilled Nursing Facility | Attending: Family Medicine | Admitting: Family Medicine

## 2019-08-06 ENCOUNTER — Encounter: Payer: Self-pay | Admitting: Family Medicine

## 2019-08-06 ENCOUNTER — Telehealth (INDEPENDENT_AMBULATORY_CARE_PROVIDER_SITE_OTHER): Payer: 59 | Admitting: Family Medicine

## 2019-08-06 ENCOUNTER — Other Ambulatory Visit: Payer: Self-pay

## 2019-08-06 ENCOUNTER — Other Ambulatory Visit (HOSPITAL_COMMUNITY): Payer: Self-pay | Admitting: Psychiatry

## 2019-08-06 DIAGNOSIS — F321 Major depressive disorder, single episode, moderate: Secondary | ICD-10-CM

## 2019-08-06 DIAGNOSIS — N3000 Acute cystitis without hematuria: Secondary | ICD-10-CM

## 2019-08-06 DIAGNOSIS — N951 Menopausal and female climacteric states: Secondary | ICD-10-CM

## 2019-08-06 DIAGNOSIS — R748 Abnormal levels of other serum enzymes: Secondary | ICD-10-CM

## 2019-08-06 DIAGNOSIS — M1990 Unspecified osteoarthritis, unspecified site: Secondary | ICD-10-CM | POA: Insufficient documentation

## 2019-08-06 DIAGNOSIS — G5603 Carpal tunnel syndrome, bilateral upper limbs: Secondary | ICD-10-CM | POA: Insufficient documentation

## 2019-08-06 DIAGNOSIS — N63 Unspecified lump in unspecified breast: Secondary | ICD-10-CM

## 2019-08-06 DIAGNOSIS — N926 Irregular menstruation, unspecified: Secondary | ICD-10-CM | POA: Insufficient documentation

## 2019-08-06 DIAGNOSIS — M539 Dorsopathy, unspecified: Secondary | ICD-10-CM | POA: Insufficient documentation

## 2019-08-06 HISTORY — DX: Irregular menstruation, unspecified: N92.6

## 2019-08-06 HISTORY — DX: Menopausal and female climacteric states: N95.1

## 2019-08-06 HISTORY — DX: Unspecified lump in unspecified breast: N63.0

## 2019-08-06 LAB — CBC
HCT: 38.9 % (ref 35.0–45.0)
Hemoglobin: 12.8 g/dL (ref 11.7–15.5)
MCH: 30.8 pg (ref 27.0–33.0)
MCHC: 32.9 g/dL (ref 32.0–36.0)
MCV: 93.5 fL (ref 80.0–100.0)
MPV: 9.9 fL (ref 7.5–12.5)
Platelets: 317 10*3/uL (ref 140–400)
RBC: 4.16 10*6/uL (ref 3.80–5.10)
RDW: 13.2 % (ref 11.0–15.0)
WBC: 5.2 10*3/uL (ref 3.8–10.8)

## 2019-08-06 LAB — POCT URINALYSIS DIP (CLINITEK)
Bilirubin, UA: NEGATIVE
Blood, UA: NEGATIVE
Glucose, UA: NEGATIVE mg/dL
Ketones, POC UA: NEGATIVE mg/dL
Nitrite, UA: NEGATIVE
POC PROTEIN,UA: NEGATIVE
Spec Grav, UA: 1.02 (ref 1.010–1.025)
Urobilinogen, UA: 0.2 E.U./dL
pH, UA: 8 (ref 5.0–8.0)

## 2019-08-06 LAB — COMPLETE METABOLIC PANEL WITH GFR
AG Ratio: 1.5 (calc) (ref 1.0–2.5)
ALT: 61 U/L — ABNORMAL HIGH (ref 6–29)
AST: 39 U/L — ABNORMAL HIGH (ref 10–35)
Albumin: 3.7 g/dL (ref 3.6–5.1)
Alkaline phosphatase (APISO): 72 U/L (ref 31–125)
BUN: 11 mg/dL (ref 7–25)
CO2: 24 mmol/L (ref 20–32)
Calcium: 8.4 mg/dL — ABNORMAL LOW (ref 8.6–10.2)
Chloride: 112 mmol/L — ABNORMAL HIGH (ref 98–110)
Creat: 1 mg/dL (ref 0.50–1.10)
GFR, Est African American: 77 mL/min/{1.73_m2} (ref >=60)
GFR, Est Non African American: 66 mL/min/{1.73_m2} (ref >=60)
Globulin: 2.5 g/dL (calc) (ref 1.9–3.7)
Glucose, Bld: 90 mg/dL (ref 65–99)
Potassium: 4.3 mmol/L (ref 3.5–5.3)
Sodium: 141 mmol/L (ref 135–146)
Total Bilirubin: 0.2 mg/dL (ref 0.2–1.2)
Total Protein: 6.2 g/dL (ref 6.1–8.1)

## 2019-08-06 LAB — HEMOGLOBIN A1C
Hgb A1c MFr Bld: 5.3 % of total Hgb (ref <5.7)
Mean Plasma Glucose: 105 (calc)
eAG (mmol/L): 5.8 (calc)

## 2019-08-06 LAB — LIPID PANEL
Cholesterol: 206 mg/dL — ABNORMAL HIGH (ref <200)
HDL: 60 mg/dL (ref >=50)
LDL Cholesterol (Calc): 127 mg/dL (calc) — ABNORMAL HIGH
Non-HDL Cholesterol (Calc): 146 mg/dL (calc) — ABNORMAL HIGH (ref <130)
Total CHOL/HDL Ratio: 3.4 (calc) (ref <5.0)
Triglycerides: 89 mg/dL (ref <150)

## 2019-08-06 LAB — VITAMIN B12: Vitamin B-12: 910 pg/mL (ref 200–1100)

## 2019-08-06 LAB — VITAMIN D 25 HYDROXY (VIT D DEFICIENCY, FRACTURES): Vit D, 25-Hydroxy: 28 ng/mL — ABNORMAL LOW (ref 30–100)

## 2019-08-06 MED ORDER — HYDROXYZINE HCL 25 MG PO TABS: 25.0000 mg | ORAL_TABLET | Freq: Every day | ORAL | 0 refills | Status: DC | PRN

## 2019-08-06 NOTE — Patient Instructions (Signed)
I appreciate the opportunity to provide you with care for your health and wellness. Today we discussed: several concerns  Follow up: 4 weeks   Labs -CMP/GFR in 3-4 weeks Referrals today- therapy  Write down things to help you identify areas that you are stressing about. Decide if you need to put energy to them.  Avoid alcohol for now.  Please continue to practice social distancing to keep you, your family, and our community safe.  If you must go out, please wear a mask and practice good handwashing.  It was a pleasure to see you and I look forward to continuing to work together on your health and well-being. Please do not hesitate to call the office if you need care or have questions about your care.  Have a wonderful day and week. With Gratitude, Cherly Beach, DNP, AGNP-BC

## 2019-08-06 NOTE — Assessment & Plan Note (Signed)
Increase in elevated liver enzymes, already had an elevation in ALT 5 months ago.  Increase dosage of Effexor could be causing transient increase of enzymes.  However she is also drinking would like to see a repeat of these in 3 to 4 weeks and if continued increase with the introduction of abstinence from alcohol continues we will need to look at possible medication adjustment changes. Patient acknowledged agreement and understanding of the plan.

## 2019-08-06 NOTE — Assessment & Plan Note (Signed)
I have advised her to consider therapy if she is willing to do this now.  She is also seeing a psychiatrist.  Increasing her medications however might need an adjustment secondary to elevation of liver enzymes.  Will be getting updated labs in a couple weeks to see if there is any changes.

## 2019-08-06 NOTE — Assessment & Plan Note (Signed)
APositive leukocytes on dip.  Sending out for culture prior to treatment.

## 2019-08-06 NOTE — Progress Notes (Signed)
Subjective:  Patient ID: Brooke [name], female    DOB: 1970-04-27  Age: 49 y.o. MRN: [id number]  CC:  Chief Complaint  Patient presents with  . Follow-up      HPI  HPI Brooke [name] is a 49 year old female patient of mine.  She presents today for 44-month follow-up.  She has a history that includes anxiety, depression, palpitations, hypertension, insomnia, panic attacks, seizures, migraines.   She reports that she has been having some increased swelling in her feet and ankles.  Is been going on for last 2 to 3 months.  She reports that it is believed and improved after she elevates them.  But as soon as she picks them back down or walks around or is active they start low back up again.  She reports that she has noticed some increased weight gain as well.  And does not know why.  Has she has not changed much of her diet.  She reports she is having some headaches but she thinks this is a side effect of the increase Effexor that the psychiatrist put her on.  Today patient denies signs and symptoms of COVID 19 infection including fever, chills, cough, shortness of breath, and headache. Past Medical, Surgical, Social History, Allergies, and Medications have been Reviewed.   Past Medical History:  Diagnosis Date  . Acid reflux   . Amenorrhea 02/06/2012  . Anxiety   . Breast lump 08/06/2019  . Cervical radiculitis   . Chronic constipation   . DDD (degenerative disc disease), lumbar   . Depression   . Dizzy spells   . Elevated vitamin B12 level 05/04/2019  . Esophageal dysphagia 11/19/2012  . Facial numbness   . Headache(784.0) 04/01/2012  . Heart palpitations 01/2017  . History of hiatal hernia   . Hypertension   . Insomnia   . Intractable migraine with visual aura and without status migrainosus 01/23/2017  . Iron deficiency anemia   . Irregular periods 08/06/2019  . LUQ pain 11/19/2012  . Menopausal symptom 08/06/2019  . Migraines    occ  . Near syncope 01/2017  . Numbness and  tingling 10/08/2016   Formatting of this note might be different from the original. ---Oct 2018-TEE----Normal left ventricular size and systolic function with no appreciable segmental abnormality. EF 60% There was no evidence of spontaneous echo contrast or thrombus in the left atrium or left atrial appendage. No significant valvular abnormalites noted Bubble study performed, this is negative.  . Numbness and tingling in left arm   . Obesity   . Other malaise and fatigue 05/19/2012  . Panic attacks   . Sciatica   . Seizures ([place])   . Slurred speech 11/07/2016   Formatting of this note might be different from the original. ---Oct 2018-TEE----Normal left ventricular size and systolic function with no appreciable segmental abnormality. EF 60% There was no evidence of spontaneous echo contrast or thrombus in the left atrium or left atrial appendage. No significant valvular abnormalites noted Bubble study performed, this is negative.  . Small bowel obstruction ([place]) 07/20/2017  . Spells of speech arrest 01/23/2017  . Transient cerebral ischemia 10/08/2016   Formatting of this note might be different from the original. ---Oct 2018-TEE----Normal left ventricular size and systolic function with no appreciable segmental abnormality. EF 60% There was no evidence of spontaneous echo contrast or thrombus in the left atrium or left atrial appendage. No significant valvular abnormalites noted Bubble study performed, this is negative.  . Word finding  difficulty 01/23/2017    Current Meds  Medication Sig  . acetaminophen (TYLENOL) 500 MG tablet Take 1,000 mg by mouth daily as needed for moderate pain or headache.  . Biotin 10000 MCG TABS Take 5,000 mcg by mouth every other day.   . calcium carbonate (OSCAL) 1500 (600 Ca) MG TABS tablet Take 600 mg by mouth daily.  . Cholecalciferol (VITAMIN D3) 50 MCG (2000 UT) TABS Take 8,000 Units by mouth daily.  . diclofenac Sodium (VOLTAREN) 1 % GEL Apply 1 g topically 3  (three) times daily as needed for pain.  . hydrOXYzine (ATARAX/VISTARIL) 10 MG tablet Take 1 tablet (10 mg total) by mouth at bedtime as needed.  . levETIRAcetam (KEPPRA) 250 MG tablet Take 250 mg by mouth in the morning and at bedtime.  Marland Kitchen linaclotide (LINZESS) 290 MCG CAPS capsule Take 1 capsule (290 mcg total) by mouth daily before breakfast.  . Multiple Vitamins-Minerals (BARIATRIC MULTIVITAMINS/IRON PO) Take 1 tablet by mouth daily.   . naltrexone (DEPADE) 50 MG tablet Take 0.5 tablets (25 mg total) by mouth daily.  . pantoprazole (PROTONIX) 40 MG tablet Take 40 mg by mouth daily.  Marland Kitchen topiramate (TOPAMAX) 100 MG tablet Take 100 mg by mouth 2 (two) times daily.   Marland Kitchen venlafaxine XR (EFFEXOR-XR) 150 MG 24 hr capsule Total of 225 mg along with 75 mg tab  . venlafaxine XR (EFFEXOR-XR) 37.5 MG 24 hr capsule Take 37.5 mg by mouth 2 (two) times daily.  Marland Kitchen venlafaxine XR (EFFEXOR-XR) 75 MG 24 hr capsule Total of 225 mg along with 150 mg tab  . [DISCONTINUED] hydrOXYzine (ATARAX/VISTARIL) 25 MG tablet Take 1 tablet (25 mg total) by mouth daily as needed for anxiety.    ROS:  Review of Systems  Constitutional: Negative.   HENT: Negative.   Eyes: Negative.   Respiratory: Negative.   Cardiovascular: Negative.   Gastrointestinal: Negative.   Genitourinary: Negative.   Musculoskeletal: Negative.   Skin: Negative.   Neurological: Negative.   Endo/Heme/Allergies: Negative.   Psychiatric/Behavioral: Negative.   All other systems reviewed and are negative.    Objective:   Today's Vitals: BP 132/84 (BP Location: Right Arm, Patient Position: Sitting, Cuff Size: Normal)   Pulse 73   Temp (!) 97 F (36.1 C) (Temporal)   Resp 18   Ht 5\' 2"  (1.575 m)   Wt 216 lb 12.8 oz (98.3 kg)   SpO2 96%   BMI 39.65 kg/m  Vitals with BMI 08/06/2019 06/24/2019 06/24/2019  Height 5\' 2"  - -  Weight 216 lbs 13 oz - -  BMI 63.78 - -  Systolic 588 502 774  Diastolic 84 95 97  Pulse 73 61 63     Physical  Exam Vitals and nursing note reviewed.  Constitutional:      Appearance: Normal appearance. She is well-developed and well-groomed. She is morbidly obese.  HENT:     Head: Normocephalic and atraumatic.     Right Ear: External ear normal.     Left Ear: External ear normal.     Mouth/Throat:     Comments: Mask in place  Eyes:     General:        Right eye: No discharge.        Left eye: No discharge.     Conjunctiva/sclera: Conjunctivae normal.  Cardiovascular:     Rate and Rhythm: Normal rate and regular rhythm.     Pulses: Normal pulses.     Heart sounds: Normal heart sounds.  Pulmonary:  Effort: Pulmonary effort is normal.     Breath sounds: Normal breath sounds.  Abdominal:     Tenderness: There is right CVA tenderness and left CVA tenderness.  Musculoskeletal:        General: Normal range of motion.     Cervical back: Normal range of motion and neck supple.  Skin:    General: Skin is warm.  Neurological:     General: No focal deficit present.     Mental Status: She is alert and oriented to person, place, and time.  Psychiatric:        Attention and Perception: Attention normal.        Mood and Affect: Mood normal.        Speech: Speech normal.        Behavior: Behavior normal. Behavior is cooperative.        Thought Content: Thought content normal.        Cognition and Memory: Cognition normal.        Judgment: Judgment normal.      Assessment   1. Morbid obesity ([place])   2. Depression, major, single episode, moderate ([place])   3. Acute cystitis without hematuria   4. Elevated liver enzymes     Tests ordered Orders Placed This Encounter  Procedures  . Urine Culture  . Comprehensive metabolic panel  . POCT URINALYSIS DIP (CLIN[facility]K)     Plan: Please see assessment and plan per problem list above.   No orders of the defined types were placed in this encounter.   Patient to follow-up in 09/03/2019  Perlie Mayo, NP

## 2019-08-06 NOTE — Assessment & Plan Note (Signed)
Obesity is linked to depression, hypertension, hyperlipidemia  Deterioration in status.  Patient is also drinking more.  Possible cause for increase as she also does not exercise. Brooke [name] is educated about the importance of exercise daily to help with weight management. A minumum of 30 minutes daily is recommended. Additionally, importance of healthy food choices  with portion control discussed.  Wt Readings from Last 3 Encounters:  08/06/19 216 lb 12.8 oz (98.3 kg)  06/24/19 190 lb (86.2 kg)  05/27/19 209 lb 12.8 oz (95.2 kg)

## 2019-08-07 ENCOUNTER — Other Ambulatory Visit: Payer: Self-pay | Admitting: Family Medicine

## 2019-08-07 ENCOUNTER — Encounter: Payer: Self-pay | Admitting: Family Medicine

## 2019-08-07 DIAGNOSIS — E559 Vitamin D deficiency, unspecified: Secondary | ICD-10-CM

## 2019-08-07 LAB — URINE CULTURE: Culture: NO GROWTH

## 2019-08-07 MED ORDER — VITAMIN D (ERGOCALCIFEROL) 1.25 MG (50000 UNIT) PO CAPS: 50000.0000 [IU] | ORAL_CAPSULE | ORAL | 1 refills | Status: DC

## 2019-08-10 ENCOUNTER — Other Ambulatory Visit: Payer: Self-pay | Admitting: Obstetrics and Gynecology

## 2019-08-11 ENCOUNTER — Telehealth (INDEPENDENT_AMBULATORY_CARE_PROVIDER_SITE_OTHER): Payer: 59 | Admitting: Psychiatry

## 2019-08-11 ENCOUNTER — Encounter (HOSPITAL_COMMUNITY): Payer: Self-pay | Admitting: Psychiatry

## 2019-08-11 ENCOUNTER — Other Ambulatory Visit: Payer: Self-pay

## 2019-08-11 DIAGNOSIS — F1020 Alcohol dependence, uncomplicated: Secondary | ICD-10-CM

## 2019-08-11 DIAGNOSIS — F410 Panic disorder [episodic paroxysmal anxiety] without agoraphobia: Secondary | ICD-10-CM | POA: Diagnosis not present

## 2019-08-11 DIAGNOSIS — F3341 Major depressive disorder, recurrent, in partial remission: Secondary | ICD-10-CM | POA: Diagnosis not present

## 2019-08-11 MED ORDER — HYDROXYZINE HCL 25 MG PO TABS: 25.0000 mg | ORAL_TABLET | Freq: Every day | ORAL | 0 refills | Status: DC | PRN

## 2019-08-11 NOTE — Patient Instructions (Signed)
1.Continue venlafaxine 225 mg daily  2. Dicontinue naltrexone 25 mg at night  3. Continue hydroxyzine 25 mg daily as needed for anxiety 4. Next appointment: 9/28 at 10:20

## 2019-08-20 ENCOUNTER — Ambulatory Visit (INDEPENDENT_AMBULATORY_CARE_PROVIDER_SITE_OTHER): Payer: 59 | Admitting: Neurology

## 2019-08-20 ENCOUNTER — Encounter: Payer: Self-pay | Admitting: Neurology

## 2019-08-20 VITALS — BP 118/78 | HR 67 | Ht 62.0 in | Wt 222.0 lb

## 2019-08-20 DIAGNOSIS — F410 Panic disorder [episodic paroxysmal anxiety] without agoraphobia: Secondary | ICD-10-CM | POA: Insufficient documentation

## 2019-08-20 DIAGNOSIS — F411 Generalized anxiety disorder: Secondary | ICD-10-CM | POA: Diagnosis not present

## 2019-08-20 DIAGNOSIS — R569 Unspecified convulsions: Secondary | ICD-10-CM | POA: Insufficient documentation

## 2019-08-20 NOTE — Progress Notes (Signed)
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 in all extremities. Sensory:  Fine touch, pinprick and vibration were tested in all extremities. Proprioception tested in the upper extremities was normal. Coordination: Rapid alternating movements in the fingers/hands was normal. Finger-to-nose maneuver  normal without evidence of ataxia, dysmetria or tremor. Gait and station:   Patient walks without assistive device . Strength within normal limits.  Stance is stable and normal. Tandem gait is unfragmented. Turns with 3 Steps. Deep tendon reflexes: in the upper extremities are symmetrically attenuated, as to the lower extremities :  the left patella was brisk- and intact. Babinski maneuver response is  Downgoing.      Assessment:  After physical and neurologic examination, review of laboratory studies,  Personal review of imaging studies, reports of other /same  Imaging studies, results of polysomnography and / or neurophysiology testing and pre-existing records as far as provided in visit., my assessment is   Mrs. Splinter has a variety of symptoms that could have different disorders in origin.  She does have  a long-standing history of rather severe migraines which have improved since she is on topiramate. She feels fatigued and daytime sleepy, but doesn't endorse symptoms of narcolepsy or cataplexy.   1) generalized anxiety disorder , manifesting in panic attacks, breath holding spells/ Vertigo - worse when stressed.   2)Status post gastric bypass - no longer losing weight.   3) non epileptic spells, under stress, chronic sleep deprivation, asxiety and depression.     The patient was advised of the nature of the diagnosed disorder , the treatment options and the  risks for general health and wellness arising from not treating the condition.   I spent more than 20 minutes of face to face time with the patient.  Greater than 50% of time was spent in counseling and coordination of care. We have discussed the diagnosis and differential and I answered the patient's questions.    Plan:  Treatment plan and additional workup :  Conttinue cognitive behavior therapy- stress and anxiety. 72 hour EEG negative   She has rather mild  obstructive sleep apnea by HST and even less by PSG -  MSLT was negative.     RV prn with Np    Larey Seat, MD 08/14/4816, 5:63 AM  Certified in Neurology by ABPN Certified in [place] by [facility] [address], [place], [place] [address]

## 2019-08-24 ENCOUNTER — Other Ambulatory Visit: Payer: Self-pay

## 2019-08-24 ENCOUNTER — Encounter: Payer: Self-pay | Admitting: *Deleted

## 2019-08-24 ENCOUNTER — Encounter: Payer: Self-pay | Admitting: Gastroenterology

## 2019-08-24 ENCOUNTER — Ambulatory Visit (INDEPENDENT_AMBULATORY_CARE_PROVIDER_SITE_OTHER): Payer: 59 | Admitting: Gastroenterology

## 2019-08-24 VITALS — BP 140/85 | HR 76 | Temp 96.9°F | Ht 62.0 in | Wt 221.8 lb

## 2019-08-24 DIAGNOSIS — K219 Gastro-esophageal reflux disease without esophagitis: Secondary | ICD-10-CM

## 2019-08-24 DIAGNOSIS — R7989 Other specified abnormal findings of blood chemistry: Secondary | ICD-10-CM

## 2019-08-24 DIAGNOSIS — K5909 Other constipation: Secondary | ICD-10-CM | POA: Diagnosis not present

## 2019-08-24 DIAGNOSIS — R945 Abnormal results of liver function studies: Secondary | ICD-10-CM | POA: Diagnosis not present

## 2019-08-24 MED ORDER — LUBIPROSTONE 24 MCG PO CAPS
24.0000 ug | ORAL_CAPSULE | Freq: Two times a day (BID) | ORAL | 3 refills | Status: DC
Start: 2019-08-24 — End: 2020-05-11

## 2019-08-24 MED ORDER — PANTOPRAZOLE SODIUM 40 MG PO TBEC: 40.0000 mg | DELAYED_RELEASE_TABLET | Freq: Two times a day (BID) | ORAL | 3 refills | Status: DC

## 2019-08-24 NOTE — Progress Notes (Signed)
Cc'ed to pcp °

## 2019-08-24 NOTE — Progress Notes (Signed)
Primary Care Physician: Perlie Mayo, NP  Primary Gastroenterologist:  Garfield Cornea, MD   Chief Complaint  Patient presents with  . Gastroesophageal Reflux    occ if eats/drinks wrong things. Some better with Protonix increase; Has been taking 2 Protonix at one time  . Constipation    Linzess 290 not helping  . Bloated    HPI: Brooke [name] is a 49 y.o. female here for follow-up chronic constipation, esophageal dysphagia.  She completed EGD back in June, esophagus appeared normal but was dilated for history of dysphagia.  Evidence of prior gastric bypass procedure.  Abnormal transaminases.  In October 2020, ALT was mildly elevated at 38.  February 2021, ALT elevated at 39.  This month her AST is 39, ALT 61.  He has gained 20 pounds in 2021.  In the past 12 months she has gained 30 pounds.  Reflux is doing better.  Only occasional breakthrough heartburn.  She is taking pantoprazole 80 mg in the morning.  States that her PCP wrote a prescription that way.  Dysphagia resolved status post dilation.  Continues to have problems with constipation, bowel movement only once every couple of weeks.  Taking Linzess 290 mcg daily, milk of magnesia twice per week.  Failed MiraLAX in the past. States she is gained all her weight back since her gastric bypass in 2019.  Hepatitis risk factors: Tattoos.  She had blood transfusion in 2000.  Current Outpatient Medications  Medication Sig Dispense Refill  . acetaminophen (TYLENOL) 500 MG tablet Take 1,000 mg by mouth daily as needed for moderate pain or headache.    . Alum & Mag Hydroxide-Simeth (MYLANTA PO) Take by mouth as needed.    . Biotin 10000 MCG TABS Take 5,000 mcg by mouth every other day.     . calcium carbonate (OSCAL) 1500 (600 Ca) MG TABS tablet Take 600 mg by mouth daily.    . diclofenac Sodium (VOLTAREN) 1 % GEL Apply 1 g topically 3 (three) times daily as needed for pain.    . hydrOXYzine (ATARAX/VISTARIL) 10 MG tablet Take  1 tablet (10 mg total) by mouth at bedtime as needed. (Patient taking differently: Take 25 mg by mouth at bedtime as needed. ) 30 tablet 0  . [START ON 09/10/2019] hydrOXYzine (ATARAX/VISTARIL) 25 MG tablet Take 1 tablet (25 mg total) by mouth daily as needed for anxiety. (Patient taking differently: Take 10 mg by mouth daily as needed for anxiety. ) 30 tablet 0  . levETIRAcetam (KEPPRA) 250 MG tablet Take 250 mg by mouth at bedtime.    Marland Kitchen linaclotide (LINZESS) 290 MCG CAPS capsule Take 1 capsule (290 mcg total) by mouth daily before breakfast. 90 capsule 3  . Magnesium Hydroxide (MILK OF MAGNESIA PO) Take by mouth as needed.    . Multiple Vitamins-Minerals (BARIATRIC MULTIVITAMINS/IRON PO) Take 1 tablet by mouth daily.     Marland Kitchen PANTOPRAZOLE SODIUM PO Take 80 mg by mouth daily.     Marland Kitchen topiramate (TOPAMAX) 100 MG tablet Take 100 mg by mouth 2 (two) times daily.     Marland Kitchen venlafaxine XR (EFFEXOR-XR) 150 MG 24 hr capsule Total of 225 mg along with 75 mg tab 90 capsule 0  . venlafaxine XR (EFFEXOR-XR) 75 MG 24 hr capsule Total of 225 mg along with 150 mg tab 90 capsule 0  . Vitamin D, Ergocalciferol, (DRISDOL) 1.25 MG (50000 UNIT) CAPS capsule Take 1 capsule (50,000 Units total) by mouth every 7 (seven) days.  12 capsule 1   No current facility-administered medications for this visit.    Allergies as of 08/24/2019  . (No Known Allergies)    ROS:  General: Negative for anorexia, weight loss, fever, chills, fatigue, weakness. ENT: Negative for hoarseness, difficulty swallowing , nasal congestion. CV: Negative for chest pain, angina, palpitations, dyspnea on exertion, peripheral edema.  Respiratory: Negative for dyspnea at rest, dyspnea on exertion, cough, sputum, wheezing.  GI: See history of present illness. GU:  Negative for dysuria, hematuria, urinary incontinence, urinary frequency, nocturnal urination.  Endo: Negative for unusual weight change.    Physical Examination:   BP 140/85   Pulse 76    Temp (!) 96.9 F (36.1 C) (Temporal)   Ht 5\' 2"  (1.575 m)   Wt 221 lb 12.8 oz (100.6 kg)   LMP 08/03/2019 (Approximate)   BMI 40.57 kg/m   General: Well-nourished, well-developed in no acute distress.  Eyes: No icterus. Mouth: masked Lungs: Clear to auscultation bilaterally.  Heart: Regular rate and rhythm, no murmurs rubs or gallops.  Abdomen: Bowel sounds are normal, nontender, nondistended, no hepatosplenomegaly or masses, no abdominal bruits or hernia , no rebound or guarding.   Extremities: No lower extremity edema. No clubbing or deformities. Neuro: Alert and oriented x 4   Skin: Warm and dry, no jaundice.   Psych: Alert and cooperative, normal mood and affect.  Labs:  Lab Results  Component Value Date   WBC 5.2 08/05/2019   HGB 12.8 08/05/2019   HCT 38.9 08/05/2019   MCV 93.5 08/05/2019   PLT 317 08/05/2019   Lab Results  Component Value Date   CREATININE 1.00 08/05/2019   BUN 11 08/05/2019   NA 141 08/05/2019   K 4.3 08/05/2019   CL 112 (H) 08/05/2019   CO2 24 08/05/2019   Lab Results  Component Value Date   ALT 61 (H) 08/05/2019   AST 39 (H) 08/05/2019   ALKPHOS 78 10/13/2018   BILITOT 0.2 08/05/2019    Lab Results  Component Value Date   VITAMINB12 910 08/05/2019   No results found for: FOLATE   Imaging Studies: No results found.   Impression/plan:  49 year old female with chronic GERD, constipation, abnormal transaminases, dysphagia.  Chronic GERD: Doing better on pantoprazole 40mg  two daily but taking both in morning. Will have her take BID before breakfast and evening meal. Dysphagia resolved s/p dilation. Reinforced antireflux measures. Strive for weight loss. Referral to healthy weight and wellness clinic.   Constipation: No improvement with Linzess 290 mcg daily.  Switch to Amitiza 24 mcg twice daily with food.  Transaminitis: Possibly due to fatty liver.  Will screen for viral hepatitis, autoimmune hepatitis, hemochromatosis.  Plan for  abdominal ultrasound.  Encouraged weight loss.  Referral to healthy weight and wellness clinic.

## 2019-08-24 NOTE — Patient Instructions (Addendum)
1. Please complete labs and ultrasound. 2. I have sent a new prescription for pantoprazole.  Take 40 mg 30 minutes before breakfast and 40 mg 30 minutes before your evening meal. 3. Start Amitiza 24 mcg twice daily with food for constipation.  Rx sent to your pharmacy. 4. Stop Linzess. 5. Referal to [facility]

## 2019-08-31 ENCOUNTER — Ambulatory Visit (HOSPITAL_COMMUNITY)
Admission: RE | Admit: 2019-08-31 | Discharge: 2019-08-31 | Disposition: A | Discharge status: Home / Self Care | Payer: 59 | Source: Ambulatory Visit | Attending: Gastroenterology | Admitting: Gastroenterology

## 2019-08-31 ENCOUNTER — Other Ambulatory Visit: Payer: Self-pay

## 2019-08-31 DIAGNOSIS — R7989 Other specified abnormal findings of blood chemistry: Secondary | ICD-10-CM

## 2019-08-31 DIAGNOSIS — R945 Abnormal results of liver function studies: Secondary | ICD-10-CM | POA: Insufficient documentation

## 2019-08-31 DIAGNOSIS — K219 Gastro-esophageal reflux disease without esophagitis: Secondary | ICD-10-CM

## 2019-08-31 DIAGNOSIS — K5909 Other constipation: Secondary | ICD-10-CM

## 2019-09-01 ENCOUNTER — Encounter: Payer: Self-pay | Admitting: Family Medicine

## 2019-09-02 ENCOUNTER — Telehealth: Payer: Self-pay | Admitting: Gastroenterology

## 2019-09-02 LAB — COMPREHENSIVE METABOLIC PANEL
ALT: 49 IU/L — ABNORMAL HIGH (ref 0–32)
AST: 30 IU/L (ref 0–40)
Albumin/Globulin Ratio: 1.8 (ref 1.2–2.2)
Albumin: 4.2 g/dL (ref 3.8–4.8)
Alkaline Phosphatase: 95 IU/L (ref 48–121)
BUN/Creatinine Ratio: 10 (ref 9–23)
BUN: 11 mg/dL (ref 6–24)
Bilirubin Total: 0.3 mg/dL (ref 0.0–1.2)
CO2: 19 mmol/L — ABNORMAL LOW (ref 20–29)
Calcium: 8.9 mg/dL (ref 8.7–10.2)
Chloride: 109 mmol/L — ABNORMAL HIGH (ref 96–106)
Creatinine, Ser: 1.06 mg/dL — ABNORMAL HIGH (ref 0.57–1.00)
GFR calc Af Amer: 71 mL/min/{1.73_m2} (ref >59)
GFR calc non Af Amer: 62 mL/min/{1.73_m2} (ref >59)
Globulin, Total: 2.4 g/dL (ref 1.5–4.5)
Glucose: 86 mg/dL (ref 65–99)
Potassium: 4.6 mmol/L (ref 3.5–5.2)
Sodium: 141 mmol/L (ref 134–144)
Total Protein: 6.6 g/dL (ref 6.0–8.5)

## 2019-09-02 NOTE — Telephone Encounter (Signed)
Opened in error

## 2019-09-03 ENCOUNTER — Encounter: Payer: Self-pay | Admitting: Family Medicine

## 2019-09-03 ENCOUNTER — Telehealth (INDEPENDENT_AMBULATORY_CARE_PROVIDER_SITE_OTHER): Payer: 59 | Admitting: Family Medicine

## 2019-09-03 ENCOUNTER — Other Ambulatory Visit: Payer: Self-pay

## 2019-09-03 DIAGNOSIS — R748 Abnormal levels of other serum enzymes: Secondary | ICD-10-CM

## 2019-09-03 NOTE — Patient Instructions (Signed)
I appreciate the opportunity to provide you with care for your health and wellness. Today we discussed: recent labs  Follow up: Feb for CPE fasting morning appt   Labs-in 4 weeks No referrals today  Continue to take great care of your body!  Eat well and healthy. Walk daily 30 mins   Take tylenol if you needed it.  Please continue to practice social distancing to keep you, your family, and our community safe.  If you must go out, please wear a mask and practice good handwashing.  It was a pleasure to see you and I look forward to continuing to work together on your health and well-being. Please do not hesitate to call the office if you need care or have questions about your care.  Have a wonderful day and week. With Gratitude, Cherly Beach, DNP, AGNP-BC

## 2019-09-03 NOTE — Assessment & Plan Note (Addendum)
Obesity is linked to depression, hypertension, hyperlipidemia.  Deterioration in is current status.  Brooke [name] is re-educated about the importance of exercise daily to help with weight management. A minumum of 30 minutes daily is recommended. Additionally, importance of healthy food choices  with portion control discussed.   Wt Readings from Last 3 Encounters:  09/03/19 222 lb (100.7 kg)  08/24/19 221 lb 12.8 oz (100.6 kg)  08/20/19 222 lb (100.7 kg)

## 2019-09-03 NOTE — Assessment & Plan Note (Signed)
Improvement in elevated liver enzymes.  AST back to normal range.  ALT still elevated.  Questionable transient increase in enzymes secondary to Effexor.  But she did have a reduction overall in her enzymes she did do a milk thistle detox.  I have encouraged her to continue abstinence from alcohol limited use of Tylenol and follow-up in 4 weeks for repeat labs to see if is still trending in the right direction.Patient acknowledged agreement and understanding of the plan.

## 2019-09-03 NOTE — Progress Notes (Signed)
Virtual Visit via Video Note   This visit type was conducted due to national recommendations for restrictions regarding the COVID-19 Pandemic (e.g. social distancing) in an effort to limit this patient's exposure and mitigate transmission in our community.  Due to her co-morbid illnesses, this patient is at least at moderate risk for complications without adequate follow up.  This format is felt to be most appropriate for this patient at this time.  All issues noted in this document were discussed and addressed.  A limited physical exam was performed with this format.      Evaluation Performed:  Follow-up visit  Date:  09/03/2019   ID:  Brooke [name], DOB Aug 16, 1970, MRN [id number]  Patient Location: Home Provider Location: Office/Clinic  Location of Patient: Home Location of Provider: Telehealth Consent was obtain for visit to be over via telehealth. I verified that I am speaking with the correct person using two identifiers.  PCP:  Perlie Mayo, NP   Chief Complaint: 4-week follow-up secondary to recent liver enzyme elevation  History of Present Illness:    Brooke [name] is a 49 y.o. female with history of anxiety, degenerative disc disease, depression, heart palpitations, hypertension, sciatica, panic attacks among others.  Most recently was noted to have increased elevation on liver enzymes over the last year.  She was educated on things to look out for to avoid so that we can see if these were falsely elevated or elevated secondary to alcohol or medication use.  She presents back today after having labs rechecked on 31 August.  With great improvement in her AST back to normal range.  And reduction of ALT by 12 points.  She reports that she avoided pork, beef.  Has not drinking alcohol.  Has not taken any Tylenol.  Additionally, was doing a milk thistle detox.  She did this for 7 days.  She denies having any abdominal pain, cough, fevers, chills, shortness of breath, chest pain,  leg swelling.  The patient does not have symptoms concerning for COVID-19 infection (fever, chills, cough, or new shortness of breath).   Past Medical, Surgical, Social History, Allergies, and Medications have been Reviewed.  Past Medical History:  Diagnosis Date  . Acid reflux   . Amenorrhea 02/06/2012  . Anxiety   . Breast lump 08/06/2019  . Cervical radiculitis   . Chronic constipation   . DDD (degenerative disc disease), lumbar   . Depression   . Dizzy spells   . Elevated vitamin B12 level 05/04/2019  . Esophageal dysphagia 11/19/2012  . Facial numbness   . Headache(784.0) 04/01/2012  . Heart palpitations 01/2017  . History of hiatal hernia   . Hypertension   . Insomnia   . Intractable migraine with visual aura and without status migrainosus 01/23/2017  . Iron deficiency anemia   . Irregular periods 08/06/2019  . LUQ pain 11/19/2012  . Menopausal symptom 08/06/2019  . Migraines    occ  . Near syncope 01/2017  . Numbness and tingling 10/08/2016   Formatting of this note might be different from the original. ---Oct 2018-TEE----Normal left ventricular size and systolic function with no appreciable segmental abnormality. EF 60% There was no evidence of spontaneous echo contrast or thrombus in the left atrium or left atrial appendage. No significant valvular abnormalites noted Bubble study performed, this is negative.  . Numbness and tingling in left arm   . Obesity   . Other malaise and fatigue 05/19/2012  . Panic attacks   .  Sciatica   . Seizures ([place])   . Slurred speech 11/07/2016   Formatting of this note might be different from the original. ---Oct 2018-TEE----Normal left ventricular size and systolic function with no appreciable segmental abnormality. EF 60% There was no evidence of spontaneous echo contrast or thrombus in the left atrium or left atrial appendage. No significant valvular abnormalites noted Bubble study performed, this is negative.  . Small bowel obstruction ([place])  07/20/2017  . Spells of speech arrest 01/23/2017  . Transient cerebral ischemia 10/08/2016   Formatting of this note might be different from the original. ---Oct 2018-TEE----Normal left ventricular size and systolic function with no appreciable segmental abnormality. EF 60% There was no evidence of spontaneous echo contrast or thrombus in the left atrium or left atrial appendage. No significant valvular abnormalites noted Bubble study performed, this is negative.  . Word finding difficulty 01/23/2017   Past Surgical History:  Procedure Laterality Date  . BUNIONECTOMY Left yrs ago  . CERVICAL ABLATION  2017  . COLONOSCOPY, ESOPHAGOGASTRODUODENOSCOPY (EGD) AND ESOPHAGEAL DILATION N/A 12/03/2012   TML:YYTKPTWS melanosis throughout the entire examined colon/The colon IS redundant/Small internal hemorrhoids/EGD:Esophageal web/Medium sized hiatal hernia/MILD Non-erosive gastritis  . ESOPHAGOGASTRODUODENOSCOPY  03/09/09   Dr. Wilford Corner, normal EGD, s/p Bravo capsule placement  . ESOPHAGOGASTRODUODENOSCOPY N/A 06/24/2019   rourk: Status post gastric bypass procedure, normal esophagus status post dilation  . GASTRIC ROUX-EN-Y N/A 07/16/2017   Procedure: LAPAROSCOPIC ROUX-EN-Y GASTRIC BYPASS WITH UPPER ENDOSCOPY AND ERAS PATHWAY;  Surgeon: Johnathan Hausen, MD;  Location: [facility];  Service: General;  Laterality: N/A;  . LAPAROSCOPY N/A 07/20/2017   Procedure: LAPAROSCOPY DIAGNOSTIC. REDUCTION OF SMALL BOWEL OBSTRUCTION. REPAIR OF TROCAR HERNIA.;  Surgeon: Alphonsa Overall, MD;  Location: [facility];  Service: General;  Laterality: N/A;  Venia Minks DILATION N/A 06/24/2019   Procedure: Keturah Shavers;  Surgeon: Daneil Dolin, MD;  Location: [facility];  Service: Endoscopy;  Laterality: N/A;  . TUBAL LIGATION    . WISDOM TOOTH EXTRACTION       Current Meds  Medication Sig  . acetaminophen (TYLENOL) 500 MG tablet Take 1,000 mg by mouth daily as needed for moderate pain or headache.  . Alum & Mag  Hydroxide-Simeth (MYLANTA PO) Take by mouth as needed.  . Biotin 10000 MCG TABS Take 5,000 mcg by mouth every other day.   . calcium carbonate (OSCAL) 1500 (600 Ca) MG TABS tablet Take 600 mg by mouth daily.  . diclofenac Sodium (VOLTAREN) 1 % GEL Apply 1 g topically 3 (three) times daily as needed for pain.  . hydrOXYzine (ATARAX/VISTARIL) 10 MG tablet Take 1 tablet (10 mg total) by mouth at bedtime as needed. (Patient taking differently: Take 25 mg by mouth at bedtime as needed. )  . [START ON 09/10/2019] hydrOXYzine (ATARAX/VISTARIL) 25 MG tablet Take 1 tablet (25 mg total) by mouth daily as needed for anxiety. (Patient taking differently: Take 10 mg by mouth daily as needed for anxiety. )  . levETIRAcetam (KEPPRA) 250 MG tablet Take 250 mg by mouth at bedtime.  Marland Kitchen lubiprostone (AMITIZA) 24 MCG capsule Take 1 capsule (24 mcg total) by mouth 2 (two) times daily with a meal.  . Magnesium Hydroxide (MILK OF MAGNESIA PO) Take by mouth as needed.  . Multiple Vitamins-Minerals (BARIATRIC MULTIVITAMINS/IRON PO) Take 1 tablet by mouth daily.   . pantoprazole (PROTONIX) 40 MG tablet Take 1 tablet (40 mg total) by mouth 2 (two) times daily before a meal.  . topiramate (TOPAMAX) 100 MG  tablet Take 100 mg by mouth 2 (two) times daily.   Marland Kitchen venlafaxine XR (EFFEXOR-XR) 150 MG 24 hr capsule Total of 225 mg along with 75 mg tab  . venlafaxine XR (EFFEXOR-XR) 75 MG 24 hr capsule Total of 225 mg along with 150 mg tab  . Vitamin D, Ergocalciferol, (DRISDOL) 1.25 MG (50000 UNIT) CAPS capsule Take 1 capsule (50,000 Units total) by mouth every 7 (seven) days.     Allergies:   Patient has no known allergies.   ROS:   Please see the history of present illness.    All other systems reviewed and are negative.   Labs/Other Tests and Data Reviewed:    Recent Labs: 10/15/2018: Magnesium 1.8 02/17/2019: TSH 1.48 08/05/2019: Hemoglobin 12.8; Platelets 317 09/01/2019: ALT 49; BUN 11; Creatinine, Ser 1.06; Potassium 4.6;  Sodium 141   Recent Lipid Panel Lab Results  Component Value Date/Time   CHOL 206 (H) 08/05/2019 09:04 AM   TRIG 89 08/05/2019 09:04 AM   HDL 60 08/05/2019 09:04 AM   CHOLHDL 3.4 08/05/2019 09:04 AM   LDLCALC 127 (H) 08/05/2019 09:04 AM    Wt Readings from Last 3 Encounters:  09/03/19 222 lb (100.7 kg)  08/24/19 221 lb 12.8 oz (100.6 kg)  08/20/19 222 lb (100.7 kg)     Objective:    Vital Signs:  BP 140/85   Ht 5\' 2"  (1.575 m)   Wt 222 lb (100.7 kg)   BMI 40.60 kg/m    VITAL SIGNS:  reviewed GEN:  no acute distress EYES:  sclerae anicteric, EOMI - Extraocular Movements Intact RESPIRATORY:  normal respiratory effort, symmetric expansion SKIN:  no rash, lesions or ulcers. MUSCULOSKELETAL:  no obvious deformities. NEURO:  alert and oriented x 3, no obvious focal deficit PSYCH:  normal affect  ASSESSMENT & PLAN:   1. Morbid obesity ([place])   2. Elevated liver enzymes  - Comprehensive metabolic panel   Time:   Today, I have spent 10 minutes with the patient with telehealth technology discussing the above problems.     Medication Adjustments/Labs and Tests Ordered: Current medicines are reviewed at length with the patient today.  Concerns regarding medicines are outlined above.   Tests Ordered: No orders of the defined types were placed in this encounter.   Medication Changes: No orders of the defined types were placed in this encounter.   Disposition:  Follow up 4 weeks for repeat labs, February for CPE Signed, Perlie Mayo, NP  09/03/2019 9:10 AM     [place]

## 2019-09-05 LAB — IRON,TIBC AND FERRITIN PANEL
%SAT: 38 % (calc) (ref 16–45)
Ferritin: 187 ng/mL (ref 16–232)
Iron: 111 ug/dL (ref 40–190)
TIBC: 292 mcg/dL (calc) (ref 250–450)

## 2019-09-05 LAB — MITOCHONDRIAL ANTIBODIES: Mitochondrial M2 Ab, IgG: <=20 U

## 2019-09-05 LAB — IGG, IGA, IGM
IgG (Immunoglobin G), Serum: 1146 mg/dL (ref 600–1640)
IgM, Serum: 147 mg/dL (ref 50–300)
Immunoglobulin A: 141 mg/dL (ref 47–310)

## 2019-09-05 LAB — ANTI-SMOOTH MUSCLE ANTIBODY, IGG: Actin (Smooth Muscle) Antibody (IGG): 25 U — ABNORMAL HIGH (ref <20)

## 2019-09-05 LAB — HEPATITIS C ANTIBODY
Hepatitis C Ab: NONREACTIVE
SIGNAL TO CUT-OFF: 0.01 (ref <1.00)

## 2019-09-05 LAB — HEPATITIS B SURFACE ANTIGEN: Hepatitis B Surface Ag: NONREACTIVE

## 2019-09-05 LAB — ANA: Anti Nuclear Antibody (ANA): NEGATIVE

## 2019-09-14 ENCOUNTER — Other Ambulatory Visit: Payer: Self-pay

## 2019-09-14 ENCOUNTER — Encounter (INDEPENDENT_AMBULATORY_CARE_PROVIDER_SITE_OTHER): Payer: Self-pay | Admitting: Family Medicine

## 2019-09-14 ENCOUNTER — Ambulatory Visit (INDEPENDENT_AMBULATORY_CARE_PROVIDER_SITE_OTHER): Payer: 59 | Admitting: Family Medicine

## 2019-09-14 VITALS — BP 123/81 | HR 72 | Temp 98.2°F | Ht 62.0 in | Wt 217.0 lb

## 2019-09-14 DIAGNOSIS — Z9884 Bariatric surgery status: Secondary | ICD-10-CM

## 2019-09-14 DIAGNOSIS — E559 Vitamin D deficiency, unspecified: Secondary | ICD-10-CM

## 2019-09-14 DIAGNOSIS — R0602 Shortness of breath: Secondary | ICD-10-CM

## 2019-09-14 DIAGNOSIS — F329 Major depressive disorder, single episode, unspecified: Secondary | ICD-10-CM

## 2019-09-14 DIAGNOSIS — F419 Anxiety disorder, unspecified: Secondary | ICD-10-CM | POA: Diagnosis not present

## 2019-09-14 DIAGNOSIS — G43819 Other migraine, intractable, without status migrainosus: Secondary | ICD-10-CM | POA: Diagnosis not present

## 2019-09-14 DIAGNOSIS — F32A Depression, unspecified: Secondary | ICD-10-CM

## 2019-09-14 DIAGNOSIS — Z9189 Other specified personal risk factors, not elsewhere classified: Secondary | ICD-10-CM | POA: Diagnosis not present

## 2019-09-14 DIAGNOSIS — R5383 Other fatigue: Secondary | ICD-10-CM

## 2019-09-14 DIAGNOSIS — E66813 Obesity, class 3: Secondary | ICD-10-CM

## 2019-09-14 DIAGNOSIS — Z0289 Encounter for other administrative examinations: Secondary | ICD-10-CM

## 2019-09-14 DIAGNOSIS — Z6841 Body Mass Index (BMI) 40.0 and over, adult: Secondary | ICD-10-CM

## 2019-09-15 LAB — CBC WITH DIFFERENTIAL/PLATELET
Basophils Absolute: 0 10*3/uL (ref 0.0–0.2)
Basos: 0 %
EOS (ABSOLUTE): 0.1 10*3/uL (ref 0.0–0.4)
Eos: 2 %
Hematocrit: 40.6 % (ref 34.0–46.6)
Hemoglobin: 13.4 g/dL (ref 11.1–15.9)
Immature Grans (Abs): 0 10*3/uL (ref 0.0–0.1)
Immature Granulocytes: 0 %
Lymphocytes Absolute: 2.1 10*3/uL (ref 0.7–3.1)
Lymphs: 45 %
MCH: 30.5 pg (ref 26.6–33.0)
MCHC: 33 g/dL (ref 31.5–35.7)
MCV: 92 fL (ref 79–97)
Monocytes Absolute: 0.3 10*3/uL (ref 0.1–0.9)
Monocytes: 7 %
Neutrophils Absolute: 2.3 10*3/uL (ref 1.4–7.0)
Neutrophils: 46 %
Platelets: 321 10*3/uL (ref 150–450)
RBC: 4.4 x10E6/uL (ref 3.77–5.28)
RDW: 12.8 % (ref 11.7–15.4)
WBC: 4.8 10*3/uL (ref 3.4–10.8)

## 2019-09-15 LAB — VITAMIN D 25 HYDROXY (VIT D DEFICIENCY, FRACTURES): Vit D, 25-Hydroxy: 41.8 ng/mL (ref 30.0–100.0)

## 2019-09-15 LAB — LIPID PANEL WITH LDL/HDL RATIO
Cholesterol, Total: 184 mg/dL (ref 100–199)
HDL: 60 mg/dL (ref >39)
LDL Chol Calc (NIH): 109 mg/dL — ABNORMAL HIGH (ref 0–99)
LDL/HDL Ratio: 1.8 ratio (ref 0.0–3.2)
Triglycerides: 84 mg/dL (ref 0–149)
VLDL Cholesterol Cal: 15 mg/dL (ref 5–40)

## 2019-09-15 LAB — COMPREHENSIVE METABOLIC PANEL
ALT: 40 IU/L — ABNORMAL HIGH (ref 0–32)
AST: 25 IU/L (ref 0–40)
Albumin/Globulin Ratio: 1.6 (ref 1.2–2.2)
Albumin: 4 g/dL (ref 3.8–4.8)
Alkaline Phosphatase: 89 IU/L (ref 44–121)
BUN/Creatinine Ratio: 12 (ref 9–23)
BUN: 11 mg/dL (ref 6–24)
Bilirubin Total: 0.2 mg/dL (ref 0.0–1.2)
CO2: 23 mmol/L (ref 20–29)
Calcium: 8.5 mg/dL — ABNORMAL LOW (ref 8.7–10.2)
Chloride: 106 mmol/L (ref 96–106)
Creatinine, Ser: 0.93 mg/dL (ref 0.57–1.00)
GFR calc Af Amer: 83 mL/min/{1.73_m2} (ref >59)
GFR calc non Af Amer: 72 mL/min/{1.73_m2} (ref >59)
Globulin, Total: 2.5 g/dL (ref 1.5–4.5)
Glucose: 77 mg/dL (ref 65–99)
Potassium: 4.5 mmol/L (ref 3.5–5.2)
Sodium: 140 mmol/L (ref 134–144)
Total Protein: 6.5 g/dL (ref 6.0–8.5)

## 2019-09-15 LAB — HEMOGLOBIN A1C
Est. average glucose Bld gHb Est-mCnc: 103 mg/dL
Hgb A1c MFr Bld: 5.2 % (ref 4.8–5.6)

## 2019-09-15 LAB — FOLATE: Folate: 10.8 ng/mL (ref >3.0)

## 2019-09-15 LAB — VITAMIN B12: Vitamin B-12: 1144 pg/mL (ref 232–1245)

## 2019-09-15 LAB — TSH: TSH: 1.39 u[IU]/mL (ref 0.450–4.500)

## 2019-09-15 LAB — INSULIN, RANDOM: INSULIN: 5.9 u[IU]/mL (ref 2.6–24.9)

## 2019-09-15 LAB — T4: T4, Total: 6.6 ug/dL (ref 4.5–12.0)

## 2019-09-15 LAB — T3: T3, Total: 110 ng/dL (ref 71–180)
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
## 2019-09-21 ENCOUNTER — Ambulatory Visit: Payer: 59 | Admitting: Psychologist
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
## 2019-09-24 ENCOUNTER — Encounter: Payer: Self-pay | Admitting: Family Medicine

## 2019-09-24 ENCOUNTER — Telehealth (INDEPENDENT_AMBULATORY_CARE_PROVIDER_SITE_OTHER): Payer: 59 | Admitting: Internal Medicine

## 2019-09-24 ENCOUNTER — Encounter: Payer: Self-pay | Admitting: Internal Medicine

## 2019-09-24 ENCOUNTER — Other Ambulatory Visit: Payer: Self-pay

## 2019-09-24 ENCOUNTER — Other Ambulatory Visit: Payer: 59

## 2019-09-24 VITALS — BP 123/81 | Ht 62.0 in | Wt 217.0 lb

## 2019-09-24 DIAGNOSIS — K529 Noninfective gastroenteritis and colitis, unspecified: Secondary | ICD-10-CM | POA: Diagnosis not present

## 2019-09-24 DIAGNOSIS — Z20822 Contact with and (suspected) exposure to covid-19: Secondary | ICD-10-CM

## 2019-09-24 NOTE — Progress Notes (Signed)
Virtual Visit via Telephone Note   This visit type was conducted due to national recommendations for restrictions regarding the COVID-19 Pandemic (e.g. social distancing) in an effort to limit this patient's exposure and mitigate transmission in our community.  Due to her co-morbid illnesses, this patient is at least at moderate risk for complications without adequate follow up.  This format is felt to be most appropriate for this patient at this time.  The patient did not have access to video technology/had technical difficulties with video requiring transitioning to audio format only (telephone).  All issues noted in this document were discussed and addressed.  No physical exam could be performed with this format.  Please refer to the patient's chart for her  consent to telehealth for [facility].   Evaluation Performed:  Follow-up visit  Date:  09/24/2019   ID:  Brooke [name], DOB 12-22-1970, MRN [id number]  Patient Location: Home Provider Location: Office/Clinic  Location of Patient: Home Location of Provider: Telehealth Consent was obtain for visit to be over via telehealth. I verified that I am speaking with the correct person using two identifiers.  PCP:  Perlie Mayo, NP   Chief Complaint:  Nausea and fever  History of Present Illness:    Brooke [name] is a 49 y.o. female with past medical history of migraine, GERD, seizure disorder and morbid obesity has a televisit today for complaint of nausea and headache since yesterday.  Patient states that she has had 2 episodes of loose BM since this morning as well.  Patient has been feeling feverish, but has not checked her temperature at home.  Her son had fever with chills and was complaining of fatigue last week.  Patient currently denies cough, shortness of breath, noticing blood in stool. Patient denies unusual food intake.  The patient does have symptoms concerning for COVID-19 infection (fever, chills, cough, or new  shortness of breath).   Patient is advised to get COVID tested and will be scheduled for it.  Past Medical, Surgical, Social History, Allergies, and Medications have been Reviewed.  Past Medical History:  Diagnosis Date  . Acid reflux   . Amenorrhea 02/06/2012  . Anxiety   . B12 deficiency   . Breast lump 08/06/2019  . Cervical radiculitis   . Chest pain   . Chronic constipation   . DDD (degenerative disc disease), lumbar   . Depression   . Dizzy spells   . Elevated vitamin B12 level 05/04/2019  . Esophageal dysphagia 11/19/2012  . Facial numbness   . Fatty liver   . Headache(784.0) 04/01/2012  . Heart palpitations 01/2017  . High cholesterol   . History of anemia   . History of hiatal hernia   . Hypertension   . Insomnia   . Intractable migraine with visual aura and without status migrainosus 01/23/2017  . Iron deficiency anemia   . Irregular periods 08/06/2019  . Joint pain   . Lactose intolerance   . LUQ pain 11/19/2012  . Menopausal symptom 08/06/2019  . Migraines    occ  . Near syncope 01/2017  . Numbness and tingling 10/08/2016   Formatting of this note might be different from the original. ---Oct 2018-TEE----Normal left ventricular size and systolic function with no appreciable segmental abnormality. EF 60% There was no evidence of spontaneous echo contrast or thrombus in the left atrium or left atrial appendage. No significant valvular abnormalites noted Bubble study performed, this is negative.  . Numbness and tingling  in left arm   . Obesity   . Other malaise and fatigue 05/19/2012  . Panic attacks   . Sciatica   . Seizures ([place])   . Slurred speech 11/07/2016   Formatting of this note might be different from the original. ---Oct 2018-TEE----Normal left ventricular size and systolic function with no appreciable segmental abnormality. EF 60% There was no evidence of spontaneous echo contrast or thrombus in the left atrium or left atrial appendage. No significant valvular  abnormalites noted Bubble study performed, this is negative.  . Small bowel obstruction ([place]) 07/20/2017  . Spells of speech arrest 01/23/2017  . Transient cerebral ischemia 10/08/2016   Formatting of this note might be different from the original. ---Oct 2018-TEE----Normal left ventricular size and systolic function with no appreciable segmental abnormality. EF 60% There was no evidence of spontaneous echo contrast or thrombus in the left atrium or left atrial appendage. No significant valvular abnormalites noted Bubble study performed, this is negative.  . Vitamin D deficiency   . Word finding difficulty 01/23/2017   Past Surgical History:  Procedure Laterality Date  . BUNIONECTOMY Left yrs ago  . CERVICAL ABLATION  2017  . COLONOSCOPY, ESOPHAGOGASTRODUODENOSCOPY (EGD) AND ESOPHAGEAL DILATION N/A 12/03/2012   WIO:MBTDHRCB melanosis throughout the entire examined colon/The colon IS redundant/Small internal hemorrhoids/EGD:Esophageal web/Medium sized hiatal hernia/MILD Non-erosive gastritis  . ESOPHAGOGASTRODUODENOSCOPY  03/09/09   Dr. Wilford Corner, normal EGD, s/p Bravo capsule placement  . ESOPHAGOGASTRODUODENOSCOPY N/A 06/24/2019   rourk: Status post gastric bypass procedure, normal esophagus status post dilation  . GASTRIC ROUX-EN-Y N/A 07/16/2017   Procedure: LAPAROSCOPIC ROUX-EN-Y GASTRIC BYPASS WITH UPPER ENDOSCOPY AND ERAS PATHWAY;  Surgeon: Johnathan Hausen, MD;  Location: [facility];  Service: General;  Laterality: N/A;  . LAPAROSCOPY N/A 07/20/2017   Procedure: LAPAROSCOPY DIAGNOSTIC. REDUCTION OF SMALL BOWEL OBSTRUCTION. REPAIR OF TROCAR HERNIA.;  Surgeon: Alphonsa Overall, MD;  Location: [facility] ORS;  Service: General;  Laterality: N/A;  Venia Minks DILATION N/A 06/24/2019   Procedure: Keturah Shavers;  Surgeon: Daneil Dolin, MD;  Location: [facility];  Service: Endoscopy;  Laterality: N/A;  . TUBAL LIGATION    . WISDOM TOOTH EXTRACTION       Current Meds  Medication Sig  . acetaminophen  (TYLENOL) 500 MG tablet Take 1,000 mg by mouth daily as needed for moderate pain or headache.  . Alum & Mag Hydroxide-Simeth (MYLANTA PO) Take by mouth as needed.  . Biotin 10000 MCG TABS Take 5,000 mcg by mouth every other day.   . calcium carbonate (OSCAL) 1500 (600 Ca) MG TABS tablet Take 600 mg by mouth daily.  . diclofenac Sodium (VOLTAREN) 1 % GEL Apply 1 g topically 3 (three) times daily as needed for pain.  . hydrOXYzine (ATARAX/VISTARIL) 25 MG tablet Take 1 tablet (25 mg total) by mouth daily as needed for anxiety. (Patient taking differently: Take 10 mg by mouth daily as needed for anxiety. )  . levETIRAcetam (KEPPRA) 250 MG tablet Take 250 mg by mouth at bedtime.  Marland Kitchen lubiprostone (AMITIZA) 24 MCG capsule Take 1 capsule (24 mcg total) by mouth 2 (two) times daily with a meal.  . Magnesium Hydroxide (MILK OF MAGNESIA PO) Take by mouth as needed.  . Multiple Vitamins-Minerals (BARIATRIC MULTIVITAMINS/IRON PO) Take 1 tablet by mouth daily.   . pantoprazole (PROTONIX) 40 MG tablet Take 1 tablet (40 mg total) by mouth 2 (two) times daily before a meal.  . topiramate (TOPAMAX) 100 MG tablet Take 100 mg by mouth 2 (  two) times daily.   Marland Kitchen venlafaxine XR (EFFEXOR-XR) 150 MG 24 hr capsule Total of 225 mg along with 75 mg tab  . venlafaxine XR (EFFEXOR-XR) 75 MG 24 hr capsule Total of 225 mg along with 150 mg tab  . Vitamin D, Ergocalciferol, (DRISDOL) 1.25 MG (50000 UNIT) CAPS capsule Take 1 capsule (50,000 Units total) by mouth every 7 (seven) days.     Allergies:   Patient has no known allergies.   ROS:   Review of Systems  Constitutional: Positive for chills, fever and malaise/fatigue.  HENT: Negative for congestion and sinus pain.   Eyes: Negative for pain and redness.  Respiratory: Negative for cough, hemoptysis and shortness of breath.   Cardiovascular: Negative for chest pain and palpitations.  Gastrointestinal: Positive for diarrhea and nausea. Negative for constipation and vomiting.   Genitourinary: Negative for dysuria and hematuria.  Musculoskeletal: Negative for back pain and neck pain.  Neurological: Positive for headaches. Negative for focal weakness and loss of consciousness.      Labs/Other Tests and Data Reviewed:    Recent Labs: 10/15/2018: Magnesium 1.8 09/14/2019: ALT 40; BUN 11; Creatinine, Ser 0.93; Hemoglobin 13.4; Platelets 321; Potassium 4.5; Sodium 140; TSH 1.390   Recent Lipid Panel Lab Results  Component Value Date/Time   CHOL 184 09/14/2019 10:14 AM   TRIG 84 09/14/2019 10:14 AM   HDL 60 09/14/2019 10:14 AM   CHOLHDL 3.4 08/05/2019 09:04 AM   LDLCALC 109 (H) 09/14/2019 10:14 AM   LDLCALC 127 (H) 08/05/2019 09:04 AM    Wt Readings from Last 3 Encounters:  09/24/19 217 lb (98.4 kg)  09/14/19 217 lb (98.4 kg)  09/03/19 222 lb (100.7 kg)     Objective:    Vital Signs:  BP 123/81   Ht 5\' 2"  (1.575 m)   Wt 217 lb (98.4 kg)   LMP 08/28/2019   BMI 39.69 kg/m    VITAL SIGNS:  reviewed  ASSESSMENT & PLAN:    Acute gastroenteritis vs possible COVID-19 infection Check COVID-19 RT PCR Advised to increase PO intake of fluids, soft diet as tolerated Advised to get immediate medical attention if dyspnea, chest pain or dizziness Antibiotics if COVID-19 negative with persistent GI symptoms  Time:   Today, I have spent 10 minutes with the patient with telehealth technology discussing the above problems.     Medication Adjustments/Labs and Tests Ordered: Current medicines are reviewed at length with the patient today.  Concerns regarding medicines are outlined above.   Tests Ordered: No orders of the defined types were placed in this encounter.   Medication Changes: No orders of the defined types were placed in this encounter.   Disposition:  Follow up if symptoms persist or worse. Signed, Lindell Spar, MD  09/24/2019 3:02 PM     [facility]

## 2019-09-24 NOTE — Patient Instructions (Addendum)
Please continue to take adequate liquids at 2-3 liters of liquid. Please continue soft diet as tolerated.  Please get tested for COVID as scheduled. Please self-quarantine till the COVID test results return.  Advise to get immediate medical attention if you have dizziness, shortness of breath, blood in sputum, blood in stool.

## 2019-09-26 ENCOUNTER — Encounter: Payer: Self-pay | Admitting: Family Medicine

## 2019-09-26 LAB — SARS-COV-2, NAA 2 DAY TAT

## 2019-09-26 LAB — NOVEL CORONAVIRUS, NAA: SARS-CoV-2, NAA: NOT DETECTED

## 2019-09-26 LAB — SPECIMEN STATUS REPORT

## 2019-09-28 ENCOUNTER — Encounter (INDEPENDENT_AMBULATORY_CARE_PROVIDER_SITE_OTHER): Payer: Self-pay | Admitting: Family Medicine

## 2019-09-28 ENCOUNTER — Other Ambulatory Visit: Payer: Self-pay

## 2019-09-28 ENCOUNTER — Ambulatory Visit (INDEPENDENT_AMBULATORY_CARE_PROVIDER_SITE_OTHER): Payer: 59 | Admitting: Family Medicine

## 2019-09-28 VITALS — BP 114/75 | HR 69 | Temp 98.0°F | Ht 62.0 in | Wt 216.0 lb

## 2019-09-28 DIAGNOSIS — E559 Vitamin D deficiency, unspecified: Secondary | ICD-10-CM | POA: Diagnosis not present

## 2019-09-28 DIAGNOSIS — E7849 Other hyperlipidemia: Secondary | ICD-10-CM

## 2019-09-28 DIAGNOSIS — Z6841 Body Mass Index (BMI) 40.0 and over, adult: Secondary | ICD-10-CM | POA: Diagnosis not present

## 2019-09-28 DIAGNOSIS — Z9189 Other specified personal risk factors, not elsewhere classified: Secondary | ICD-10-CM | POA: Diagnosis not present

## 2019-09-28 MED ORDER — WEGOVY 0.25 MG/0.5ML ~~LOC~~ SOAJ: 0.2500 mg | SUBCUTANEOUS | 0 refills | Status: DC

## 2019-09-28 NOTE — Telephone Encounter (Signed)
Per the Test you had completed for the Covid 19 Virus, there was no detection found.   All the [facility] Arthelia Callicott

## 2019-09-29 ENCOUNTER — Encounter (HOSPITAL_COMMUNITY): Payer: Self-pay | Admitting: Psychiatry

## 2019-09-29 ENCOUNTER — Telehealth (INDEPENDENT_AMBULATORY_CARE_PROVIDER_SITE_OTHER): Payer: 59 | Admitting: Psychiatry

## 2019-09-29 DIAGNOSIS — F330 Major depressive disorder, recurrent, mild: Secondary | ICD-10-CM | POA: Diagnosis not present

## 2019-09-29 DIAGNOSIS — F410 Panic disorder [episodic paroxysmal anxiety] without agoraphobia: Secondary | ICD-10-CM

## 2019-09-29 MED ORDER — HYDROXYZINE HCL 25 MG PO TABS: 25.0000 mg | ORAL_TABLET | Freq: Every day | ORAL | 0 refills | Status: DC | PRN

## 2019-09-29 MED ORDER — ARIPIPRAZOLE 2 MG PO TABS: 2.0000 mg | ORAL_TABLET | Freq: Every day | ORAL | 1 refills | Status: DC

## 2019-09-29 MED ORDER — VENLAFAXINE HCL ER 150 MG PO CP24
ORAL_CAPSULE | ORAL | 0 refills | Status: DC
Start: 2019-09-29 — End: 2019-12-22

## 2019-09-29 MED ORDER — VENLAFAXINE HCL ER 75 MG PO CP24
ORAL_CAPSULE | ORAL | 0 refills | Status: DC
Start: 2019-09-29 — End: 2019-10-14

## 2019-09-29 NOTE — Progress Notes (Signed)
Chief Complaint:   OBESITY Brooke [name] is here to discuss her progress with her obesity treatment plan along with follow-up of her obesity related diagnoses. Brooke [name] is on the [employer] and states she is following her eating plan approximately 50% of the time. Brooke [name] states she is doing 0 minutes 0 times per week.  Today's visit was #: 2 Starting weight: 217 lbs Starting date: 09/14/2019 Today's weight: 216 lbs Today's date: 09/28/2019 Total lbs lost to date: 1 Total lbs lost since last in-office visit: 1  Interim History: Brooke [name] felt a little bloated from the yogurt. She got food on the second week but didn't eat for a handful of days during the 2 weeks, secondary to being ill. She denies hunger and she felt the meals were large enough. No changes , and she has no plans for the upcoming weeks. She is still snaking on Cheerios or Core Flakes. Breakfast she is doing eggs, and lunch is tuna sandwich, and dinner is homemade soup with chicken and vegetables.  Subjective:   1. Other hyperlipidemia Brooke [name]'s last LDL was 109, HDL 60, triglycerides 84, and total cholesterol 184. She is not on statin, and her 10 year ASCVD risk score is 0.9%. I discussed labs with the patient today.  2. Vitamin D deficiency Brooke [name] denies nausea, vomiting, or muscle weakness, but she notes fatigue. She is on 50,000 IU weekly Vit D, and her last level was 41.8. I discussed labs with the patient today.  3. At risk for constipation Brooke [name] is at increased risk for constipation due history oc constipation, and is now on increased protein diet and starting GLP-1.  Assessment/Plan:   1. Other hyperlipidemia Cardiovascular risk and specific lipid/LDL goals reviewed. We discussed several lifestyle modifications today. Brooke [name] will continue Category 2 plan or Pescatarian plan, and will continue to work on exercise and weight loss efforts. We will repeat labs in 3 months. Orders and follow up as documented in patient  record.   Counseling Intensive lifestyle modifications are the first line treatment for this issue. . Dietary changes: Increase soluble fiber. Decrease simple carbohydrates. . Exercise changes: Moderate to vigorous-intensity aerobic activity 150 minutes per week if tolerated. . Lipid-lowering medications: see documented in medical record.  2. Vitamin D deficiency Low Vitamin D level contributes to fatigue and are associated with obesity, breast, and colon cancer. Brooke [name] agreed to continue taking prescription Vitamin D 50,000 IU every week and we will repeat labs in 3 months. She will follow-up for routine testing of Vitamin D, at least 2-3 times per year to avoid over-replacement.  3. At risk for constipation Brooke [name] was given approximately 15 minutes of counseling today regarding prevention of constipation. She was encouraged to increase water and fiber intake.   4. Class 3 severe obesity with serious comorbidity and body mass index (BMI) of 40.0 to 44.9 in adult, unspecified obesity type (HCC) Brooke [name] is currently in the action stage of change. As such, her goal is to continue with weight loss efforts. She has agreed to the Category 2 Plan or the Brooke [name].   We discussed various medication options to help Brooke [name] with her weight loss efforts and we both agreed to start Wegovy 0.25 mg SubQ weekly with no refills.  - Semaglutide-Weight Management (WEGOVY) 0.25 MG/0.5ML SOAJ; Inject 0.25 mg into the skin once a week.  Dispense: 2 mL; Refill: 0  Exercise goals: No exercise has been prescribed at this time.  Behavioral modification strategies: increasing lean protein intake, meal  planning and cooking strategies, keeping healthy foods in the home and planning for success.  Asees has agreed to follow-up with our clinic in 2 weeks. She was informed of the importance of frequent follow-up visits to maximize her success with intensive lifestyle modifications for her multiple health conditions.    Objective:   Blood pressure 114/75, pulse 69, temperature 98 F (36.7 C), temperature source Oral, height 5\' 2"  (1.575 m), weight 216 lb (98 kg), last menstrual period 09/02/2019, SpO2 100 %. Body mass index is 39.51 kg/m.  General: Cooperative, alert, well developed, in no acute distress. HEENT: Conjunctivae and lids unremarkable. Cardiovascular: Regular rhythm.  Lungs: Normal work of breathing. Neurologic: No focal deficits.   Lab Results  Component Value Date   CREATININE 0.93 09/14/2019   BUN 11 09/14/2019   NA 140 09/14/2019   K 4.5 09/14/2019   CL 106 09/14/2019   CO2 23 09/14/2019   Lab Results  Component Value Date   ALT 40 (H) 09/14/2019   AST 25 09/14/2019   ALKPHOS 89 09/14/2019   BILITOT 0.2 09/14/2019   Lab Results  Component Value Date   HGBA1C 5.2 09/14/2019   HGBA1C 5.3 08/05/2019   Lab Results  Component Value Date   INSULIN 5.9 09/14/2019   Lab Results  Component Value Date   TSH 1.390 09/14/2019   Lab Results  Component Value Date   CHOL 184 09/14/2019   HDL 60 09/14/2019   LDLCALC 109 (H) 09/14/2019   TRIG 84 09/14/2019   CHOLHDL 3.4 08/05/2019   Lab Results  Component Value Date   WBC 4.8 09/14/2019   HGB 13.4 09/14/2019   HCT 40.6 09/14/2019   MCV 92 09/14/2019   PLT 321 09/14/2019   Lab Results  Component Value Date   IRON 111 09/03/2019   TIBC 292 09/03/2019   FERRITIN 187 09/03/2019   Attestation Statements:   Reviewed by clinician on day of visit: allergies, medications, problem list, medical history, surgical history, family history, social history, and previous encounter notes.   I, Trixie Dredge, am acting as transcriptionist for Coralie Common, MD.  I have reviewed the above documentation for accuracy and completeness, and I agree with the above. - Jinny Blossom, MD

## 2019-09-29 NOTE — Patient Instructions (Addendum)
1.Continue venlafaxine225mg  daily  2. Start Abilify 2 mg at night  3. Continue hydroxyzine 10mg /25 mg daily as needed for anxiety 4. Next appointment:11/9 at noon

## 2019-10-02 ENCOUNTER — Encounter: Payer: Self-pay | Admitting: Family Medicine

## 2019-10-05 ENCOUNTER — Other Ambulatory Visit: Payer: Self-pay | Admitting: Family Medicine

## 2019-10-05 ENCOUNTER — Telehealth: Payer: Self-pay | Admitting: Gastroenterology

## 2019-10-05 ENCOUNTER — Other Ambulatory Visit: Payer: Self-pay | Admitting: Neurology

## 2019-10-05 ENCOUNTER — Telehealth: Payer: 59 | Admitting: Internal Medicine

## 2019-10-05 DIAGNOSIS — Z72820 Sleep deprivation: Secondary | ICD-10-CM

## 2019-10-05 DIAGNOSIS — F321 Major depressive disorder, single episode, moderate: Secondary | ICD-10-CM

## 2019-10-05 NOTE — Telephone Encounter (Signed)
Please let pt know I reviewed her labs done by PCP recently. LFTs overall some improved. ALT remains slightly elevated.   Let's have her follow up with [place] in six months.

## 2019-10-05 NOTE — Telephone Encounter (Signed)
Spoke with pt. Pt was notified that results were reviewed. Pt was scheduled a 6 month apt.

## 2019-10-09 ENCOUNTER — Telehealth: Payer: Self-pay | Admitting: Family Medicine

## 2019-10-09 ENCOUNTER — Other Ambulatory Visit: Payer: Self-pay | Admitting: Family Medicine

## 2019-10-09 NOTE — Telephone Encounter (Signed)
Patient states she takes 10mg  during the day and 25mg  at night. Also Dr.Ferguson prescribed it for her cycles something to do with her hormones. Dr.Hisada is aware she is taking 10mg 

## 2019-10-09 NOTE — Telephone Encounter (Signed)
Will you fill this for her?

## 2019-10-09 NOTE — Telephone Encounter (Signed)
Patient needing a refill on hydroxyzine 10mg  Dr Glo Herring used to write this for her but has since retired she has been without for a few days now p# [phone] uses [facility] on [address]

## 2019-10-09 NOTE — Telephone Encounter (Signed)
Yes, I have refilledi t

## 2019-10-09 NOTE — Telephone Encounter (Signed)
I see she gets 25mg  from Dr Modesta Messing. How was she taking 10 mg and what for? Then I can refill it if appropriate. Thank you

## 2019-10-13 ENCOUNTER — Encounter: Payer: Self-pay | Admitting: Family Medicine

## 2019-10-13 ENCOUNTER — Ambulatory Visit (INDEPENDENT_AMBULATORY_CARE_PROVIDER_SITE_OTHER): Payer: 59 | Admitting: Family Medicine

## 2019-10-13 ENCOUNTER — Encounter (INDEPENDENT_AMBULATORY_CARE_PROVIDER_SITE_OTHER): Payer: Self-pay | Admitting: Family Medicine

## 2019-10-13 ENCOUNTER — Other Ambulatory Visit: Payer: Self-pay

## 2019-10-13 VITALS — BP 105/73 | HR 88 | Temp 98.1°F | Ht 62.0 in | Wt 216.0 lb

## 2019-10-13 DIAGNOSIS — Z6839 Body mass index (BMI) 39.0-39.9, adult: Secondary | ICD-10-CM

## 2019-10-13 DIAGNOSIS — E559 Vitamin D deficiency, unspecified: Secondary | ICD-10-CM

## 2019-10-13 DIAGNOSIS — G43809 Other migraine, not intractable, without status migrainosus: Secondary | ICD-10-CM

## 2019-10-13 MED ORDER — WEGOVY 0.5 MG/0.5ML ~~LOC~~ SOAJ: 0.5000 mg | SUBCUTANEOUS | 0 refills | Status: DC

## 2019-10-14 ENCOUNTER — Encounter: Payer: Self-pay | Admitting: Family Medicine

## 2019-10-14 ENCOUNTER — Telehealth (INDEPENDENT_AMBULATORY_CARE_PROVIDER_SITE_OTHER): Payer: 59 | Admitting: Family Medicine

## 2019-10-14 VITALS — BP 105/73 | Ht 62.0 in | Wt 215.0 lb

## 2019-10-14 DIAGNOSIS — J3089 Other allergic rhinitis: Secondary | ICD-10-CM | POA: Diagnosis not present

## 2019-10-14 DIAGNOSIS — R059 Cough, unspecified: Secondary | ICD-10-CM

## 2019-10-14 MED ORDER — LEVOCETIRIZINE DIHYDROCHLORIDE 5 MG PO TABS: 5.0000 mg | ORAL_TABLET | Freq: Every evening | ORAL | 2 refills | Status: DC

## 2019-10-14 MED ORDER — FLUTICASONE PROPIONATE 50 MCG/ACT NA SUSP: 2.0000 | Freq: Every day | NASAL | 1 refills | Status: DC

## 2019-10-14 MED ORDER — PROMETHAZINE-DM 6.25-15 MG/5ML PO SYRP: 2.5000 mL | ORAL_SOLUTION | Freq: Three times a day (TID) | ORAL | 0 refills | Status: DC | PRN

## 2019-10-14 NOTE — Patient Instructions (Signed)
  HAPPY FALL!  I appreciate the opportunity to provide you with care for your health and wellness. Today we discussed: cold vs allergies  Follow up: as scheduled  No labs or referrals today  Please get covid test as we discussed.  Start symptom management with medications sent in, call if you have problems or do not get better.  Please continue to practice social distancing to keep you, your family, and our community safe.  If you must go out, please wear a mask and practice good handwashing.  It was a pleasure to see you and I look forward to continuing to work together on your health and well-being. Please do not hesitate to call the office if you need care or have questions about your care.  Have a wonderful day and week. With Gratitude, Cherly Beach, DNP, AGNP-BC

## 2019-10-14 NOTE — Assessment & Plan Note (Signed)
Questionable allergies versus viral infection.  Advised to get Covid testing.  Have provided symptom management relief with cough syrup, Flonase and Xyzal.  Encouraged her to reach back out if these do not help her symptoms and her Covid test is normal and/or positive.  Additionally encouraged to let me know if she has a positive test in case her cough gets worse so that we can prescribe her prednisone if needed.  Encouraged her to use Tylenol for body aches and pains and headaches. Reviewed side effects, risks and benefits of medication.   Patient acknowledged agreement and understanding of the plan.

## 2019-10-14 NOTE — Progress Notes (Signed)
Virtual Visit via Telephone Note   This visit type was conducted due to national recommendations for restrictions regarding the COVID-19 Pandemic (e.g. social distancing) in an effort to limit this patient's exposure and mitigate transmission in our community.  Due to her co-morbid illnesses, this patient is at least at moderate risk for complications without adequate follow up.  This format is felt to be most appropriate for this patient at this time.  The patient did not have access to video technology/had technical difficulties with video requiring transitioning to audio format only (telephone).  All issues noted in this document were discussed and addressed.  No physical exam could be performed with this format.   Evaluation Performed:  Follow-up visit  Date:  10/14/2019   ID:  Brooke [name], DOB [birth year], MRN [id number]  Patient Location: Home Provider Location: Home Office  Location of Patient: Home Location of Provider: Telehealth Consent was obtain for visit to be over via telehealth. I verified that I am speaking with the correct person using two identifiers.  PCP:  Perlie Mayo, NP   Chief Complaint: Sinus issues and headache.  History of Present Illness:    Brooke [name] is a 49 y.o. female with history as stated below.  Reports today for an acute phone visit secondary to feeling poorly.  Reports that she is having migraine for the last 4 days.  She has had sinus drainage and a cough for the last week.  She reports a sore throat that started last 3 to 4 days.  She has itchy watery eyes.  She also has body aches.  She reports taste and smell are still intact.  She denies having any fevers or chills at this time.  She denies having any shortness of breath, chest pain or palpitations.  She does have some mucus when she coughs but it is usually clear in nature.  She does question whether or not this is an allergy issue secondary to the weather change.  Has not had any sick  contacts but is willing to go get Covid testing.   The patient does have some symptoms concerning for COVID-19 infection (fever, chills, cough, or new shortness of breath).   Past Medical, Surgical, Social History, Allergies, and Medications have been Reviewed.  Past Medical History:  Diagnosis Date  . Acid reflux   . Amenorrhea 02/06/2012  . Anxiety   . Arthritis    [name] 10/13/2019  . B12 deficiency   . Breast lump 08/06/2019  . Cervical radiculitis   . Chest pain   . Chronic constipation   . DDD (degenerative disc disease), lumbar   . Depression   . Depression    [name] 10/13/2019  . Dizzy spells   . Elevated vitamin B12 level 05/04/2019  . Esophageal dysphagia 11/19/2012  . Facial numbness   . Fatty liver   . GERD (gastroesophageal reflux disease)    [name] 10/13/2019  . Headache(784.0) 04/01/2012  . Heart palpitations 01/2017  . High cholesterol   . History of anemia   . History of hiatal hernia   . Hypertension   . Insomnia   . Intractable migraine with visual aura and without status migrainosus 01/23/2017  . Iron deficiency anemia   . Irregular periods 08/06/2019  . Joint pain   . Lactose intolerance   . LUQ pain 11/19/2012  . Menopausal symptom 08/06/2019  . Migraines    occ  . Near syncope 01/2017  . Numbness and tingling 10/08/2016  Formatting of this note might be different from the original. ---Oct 2018-TEE----Normal left ventricular size and systolic function with no appreciable segmental abnormality. EF 60% There was no evidence of spontaneous echo contrast or thrombus in the left atrium or left atrial appendage. No significant valvular abnormalites noted Bubble study performed, this is negative.  . Numbness and tingling in left arm   . Obesity   . Other malaise and fatigue 05/19/2012  . Panic attacks   . Sciatica   . Seizures ([place])   . Slurred speech 11/07/2016   Formatting of this note might be different from the original. ---Oct 2018-TEE----Normal left  ventricular size and systolic function with no appreciable segmental abnormality. EF 60% There was no evidence of spontaneous echo contrast or thrombus in the left atrium or left atrial appendage. No significant valvular abnormalites noted Bubble study performed, this is negative.  . Small bowel obstruction ([place]) 07/20/2017  . Spells of speech arrest 01/23/2017  . Transient cerebral ischemia 10/08/2016   Formatting of this note might be different from the original. ---Oct 2018-TEE----Normal left ventricular size and systolic function with no appreciable segmental abnormality. EF 60% There was no evidence of spontaneous echo contrast or thrombus in the left atrium or left atrial appendage. No significant valvular abnormalites noted Bubble study performed, this is negative.  . Vitamin D deficiency   . Word finding difficulty 01/23/2017   Past Surgical History:  Procedure Laterality Date  . BUNIONECTOMY Left yrs ago  . CERVICAL ABLATION  2017  . COLONOSCOPY, ESOPHAGOGASTRODUODENOSCOPY (EGD) AND ESOPHAGEAL DILATION N/A 12/03/2012   SJG:GEZMOQHU melanosis throughout the entire examined colon/The colon IS redundant/Small internal hemorrhoids/EGD:Esophageal web/Medium sized hiatal hernia/MILD Non-erosive gastritis  . ESOPHAGOGASTRODUODENOSCOPY  03/09/09   Dr. Wilford Corner, normal EGD, s/p Bravo capsule placement  . ESOPHAGOGASTRODUODENOSCOPY N/A 06/24/2019   rourk: Status post gastric bypass procedure, normal esophagus status post dilation  . EYE SURGERY N/A    [name] 10/13/2019  . GASTRIC ROUX-EN-Y N/A 07/16/2017   Procedure: LAPAROSCOPIC ROUX-EN-Y GASTRIC BYPASS WITH UPPER ENDOSCOPY AND ERAS PATHWAY;  Surgeon: Johnathan Hausen, MD;  Location: [facility];  Service: General;  Laterality: N/A;  . LAPAROSCOPY N/A 07/20/2017   Procedure: LAPAROSCOPY DIAGNOSTIC. REDUCTION OF SMALL BOWEL OBSTRUCTION. REPAIR OF TROCAR HERNIA.;  Surgeon: Alphonsa Overall, MD;  Location: [facility];  Service: General;  Laterality: N/A;  Venia Minks DILATION N/A 06/24/2019   Procedure: Keturah Shavers;  Surgeon: Daneil Dolin, MD;  Location: [facility];  Service: Endoscopy;  Laterality: N/A;  . TUBAL LIGATION    . WISDOM TOOTH EXTRACTION       Current Meds  Medication Sig  . acetaminophen (TYLENOL) 500 MG tablet Take 1,000 mg by mouth daily as needed for moderate pain or headache.  . Alum & Mag Hydroxide-Simeth (MYLANTA PO) Take by mouth as needed.  . ARIPiprazole (ABILIFY) 2 MG tablet Take 1 tablet (2 mg total) by mouth at bedtime.  . Biotin 10000 MCG TABS Take 5,000 mcg by mouth every other day.   . calcium carbonate (OSCAL) 1500 (600 Ca) MG TABS tablet Take 600 mg by mouth daily.  . diclofenac Sodium (VOLTAREN) 1 % GEL Apply 1 g topically 3 (three) times daily as needed for pain.  . hydrOXYzine (ATARAX/VISTARIL) 25 MG tablet Take 1 tablet (25 mg total) by mouth daily as needed for anxiety.  . levETIRAcetam (KEPPRA) 250 MG tablet Take 250 mg by mouth at bedtime.  Marland Kitchen lubiprostone (AMITIZA) 24 MCG capsule Take 1 capsule (24 mcg  total) by mouth 2 (two) times daily with a meal.  . Multiple Vitamins-Minerals (BARIATRIC MULTIVITAMINS/IRON PO) Take 1 tablet by mouth daily.   . pantoprazole (PROTONIX) 40 MG tablet Take 1 tablet (40 mg total) by mouth 2 (two) times daily before a meal.  . Semaglutide-Weight Management (WEGOVY) 0.5 MG/0.5ML SOAJ Inject 0.5 mg into the skin once a week.  . topiramate (TOPAMAX) 100 MG tablet Take 100 mg by mouth 2 (two) times daily.   Marland Kitchen venlafaxine XR (EFFEXOR-XR) 150 MG 24 hr capsule Total of 225 mg along with 75 mg tab  . Vitamin D, Ergocalciferol, (DRISDOL) 1.25 MG (50000 UNIT) CAPS capsule Take 1 capsule (50,000 Units total) by mouth every 7 (seven) days.     Allergies:   Patient has no known allergies.   ROS:   Please see the history of present illness.    All other systems reviewed and are negative.   Labs/Other Tests and Data Reviewed:    Recent Labs: 10/15/2018: Magnesium  1.8 09/14/2019: ALT 40; BUN 11; Creatinine, Ser 0.93; Hemoglobin 13.4; Platelets 321; Potassium 4.5; Sodium 140; TSH 1.390   Recent Lipid Panel Lab Results  Component Value Date/Time   CHOL 184 09/14/2019 10:14 AM   TRIG 84 09/14/2019 10:14 AM   HDL 60 09/14/2019 10:14 AM   CHOLHDL 3.4 08/05/2019 09:04 AM   LDLCALC 109 (H) 09/14/2019 10:14 AM   LDLCALC 127 (H) 08/05/2019 09:04 AM    Wt Readings from Last 3 Encounters:  10/14/19 215 lb (97.5 kg)  10/13/19 216 lb (98 kg)  09/28/19 216 lb (98 kg)     Objective:    Vital Signs:  BP 105/73   Ht 5\' 2"  (1.575 m)   Wt 215 lb (97.5 kg)   LMP 10/01/2019   BMI 39.32 kg/m    VITAL SIGNS:  reviewed GEN:  no acute distress RESPIRATORY:  no shortness of breath in conversation  PSYCH:  normal affect and mood   ASSESSMENT & PLAN:    1. Environmental and seasonal allergies - levocetirizine (XYZAL) 5 MG tablet; Take 1 tablet (5 mg total) by mouth every evening.  Dispense: 30 tablet; Refill: 2 - promethazine-dextromethorphan (PROMETHAZINE-DM) 6.25-15 MG/5ML syrup; Take 2.5 mLs by mouth 3 (three) times daily as needed for cough.  Dispense: 118 mL; Refill: 0 - fluticasone (FLONASE) 50 MCG/ACT nasal spray; Place 2 sprays into both nostrils daily.  Dispense: 16 g; Refill: 1  2. Cough  - promethazine-dextromethorphan (PROMETHAZINE-DM) 6.25-15 MG/5ML syrup; Take 2.5 mLs by mouth 3 (three) times daily as needed for cough.  Dispense: 118 mL; Refill: 0  Time:   Today, I have spent 5 minutes with the patient with telehealth technology discussing the above problems.     Medication Adjustments/Labs and Tests Ordered: Current medicines are reviewed at length with the patient today.  Concerns regarding medicines are outlined above.   Tests Ordered: No orders of the defined types were placed in this encounter.   Medication Changes: Meds ordered this encounter  Medications  . levocetirizine (XYZAL) 5 MG tablet    Sig: Take 1 tablet (5 mg  total) by mouth every evening.    Dispense:  30 tablet    Refill:  2    Order Specific Question:   Supervising Provider    Answer:   SIMPSON, MARGARET E [[id number]]  . promethazine-dextromethorphan (PROMETHAZINE-DM) 6.25-15 MG/5ML syrup    Sig: Take 2.5 mLs by mouth 3 (three) times daily as needed for cough.    Dispense:  118 mL    Refill:  0    Order Specific Question:   Supervising Provider    Answer:   SIMPSON, MARGARET E [[id number]]  . fluticasone (FLONASE) 50 MCG/ACT nasal spray    Sig: Place 2 sprays into both nostrils daily.    Dispense:  16 g    Refill:  1    Order Specific Question:   Supervising Provider    Answer:   Fayrene Helper [[id number]]     Note: This dictation was prepared with Dragon dictation along with smaller phrase technology. Similar sounding words can be transcribed inadequately or may not be corrected upon review. Any transcriptional errors that result from this process are unintentional.      Disposition:  Follow up 02/17/2020 Signed, Perlie Mayo, NP  10/14/2019 3:59 PM     [facility]

## 2019-10-14 NOTE — Assessment & Plan Note (Signed)
Cough syrup provided.  Unsure if it is allergy related versus something viral.  She is encouraged to get a Covid test.  She reports verbal understanding of this and will sign up to get one.

## 2019-10-15 ENCOUNTER — Telehealth: Payer: 59 | Admitting: Family Medicine

## 2019-10-15 ENCOUNTER — Other Ambulatory Visit: Payer: 59

## 2019-10-15 DIAGNOSIS — Z20822 Contact with and (suspected) exposure to covid-19: Secondary | ICD-10-CM

## 2019-10-15 NOTE — Progress Notes (Signed)
Chief Complaint:   OBESITY Brooke [name] is here to discuss her progress with her obesity treatment plan along with follow-up of her obesity related diagnoses. Brooke [name] is on the [employer] and states she is following her eating plan approximately 75% of the time. Brooke [name] states she is walking for 30 minutes 3-4 times per week.  Today's visit was #: 3 Starting weight: 217 lbs Starting date: 09/14/2019 Today's weight: 216 lbs Today's date: 10/13/2019 Total lbs lost to date: 1 lb Total lbs lost since last in-office visit: 0  Interim History: Brooke [name] has experienced some non-scale victories such as her clothes fitting looser and feeling better overall.  She ate out one time at [employer] and ate a salad from [place].  No plans for the upcoming weeks prior to [employer].  She is often skipping breakfast and not getting all food in on plan.  She is on Wegovy 0.25 mg subcutaneously weekly.  Subjective:   1. Vitamin D deficiency Brooke [name] Vitamin D level was 41.8 on 09/14/2019. She is currently taking OTC vitamin D. She denies nausea, vomiting or muscle weakness.  She endorses fatigue.  2. Other migraine without status migrainosus, not intractable She had a recent migraine that lasted about 5 days.  Improved over time.  Assessment/Plan:   1. Vitamin D deficiency Low Vitamin D level contributes to fatigue and are associated with obesity, breast, and colon cancer. Continue OTC vitamin D.  2. Other migraine without status migrainosus, not intractable Follow-up frequency of migraines at next appointment.  3. Class 2 severe obesity with serious comorbidity and body mass index (BMI) of 39.0 to 39.9 in adult, unspecified obesity type (HCC)  -Refill Semaglutide-Weight Management (WEGOVY) 0.5 MG/0.5ML SOAJ; Inject 0.5 mg into the skin once a week.  Dispense: 2 mL; Refill: 0  Brooke [name] is currently in the action stage of change. As such, her goal is to continue with weight loss efforts. She has  agreed to the Category 1 Plan.   Exercise goals: As is.  Behavioral modification strategies: increasing lean protein intake, decreasing eating out, no skipping meals, meal planning and cooking strategies and keeping healthy foods in the home.  Brooke [name] has agreed to follow-up with our clinic in 2-3 weeks. She was informed of the importance of frequent follow-up visits to maximize her success with intensive lifestyle modifications for her multiple health conditions.   Objective:   Blood pressure 105/73, pulse 88, temperature 98.1 F (36.7 C), temperature source Oral, height 5\' 2"  (1.575 m), weight 216 lb (98 kg), last menstrual period 10/01/2019, SpO2 97 %. Body mass index is 39.51 kg/m.  General: Cooperative, alert, well developed, in no acute distress. HEENT: Conjunctivae and lids unremarkable. Cardiovascular: Regular rhythm.  Lungs: Normal work of breathing. Neurologic: No focal deficits.   Lab Results  Component Value Date   CREATININE 0.93 09/14/2019   BUN 11 09/14/2019   NA 140 09/14/2019   K 4.5 09/14/2019   CL 106 09/14/2019   CO2 23 09/14/2019   Lab Results  Component Value Date   ALT 40 (H) 09/14/2019   AST 25 09/14/2019   ALKPHOS 89 09/14/2019   BILITOT 0.2 09/14/2019   Lab Results  Component Value Date   HGBA1C 5.2 09/14/2019   HGBA1C 5.3 08/05/2019   Lab Results  Component Value Date   INSULIN 5.9 09/14/2019   Lab Results  Component Value Date   TSH 1.390 09/14/2019   Lab Results  Component Value Date   CHOL 184 09/14/2019  HDL 60 09/14/2019   LDLCALC 109 (H) 09/14/2019   TRIG 84 09/14/2019   CHOLHDL 3.4 08/05/2019   Lab Results  Component Value Date   WBC 4.8 09/14/2019   HGB 13.4 09/14/2019   HCT 40.6 09/14/2019   MCV 92 09/14/2019   PLT 321 09/14/2019   Lab Results  Component Value Date   IRON 111 09/03/2019   TIBC 292 09/03/2019   FERRITIN 187 09/03/2019   Attestation Statements:   Reviewed by clinician on day of visit:  allergies, medications, problem list, medical history, surgical history, family history, social history, and previous encounter notes.  Time spent on visit including pre-visit chart review and post-visit care and charting was 18 minutes.   I, Water quality scientist, CMA, am acting as transcriptionist for Coralie Common, MD.  I have reviewed the above documentation for accuracy and completeness, and I agree with the above. - Jinny Blossom, MD

## 2019-10-16 ENCOUNTER — Encounter: Payer: Self-pay | Admitting: Family Medicine

## 2019-10-16 LAB — NOVEL CORONAVIRUS, NAA: SARS-CoV-2, NAA: NOT DETECTED

## 2019-10-16 LAB — SARS-COV-2, NAA 2 DAY TAT

## 2019-10-22 ENCOUNTER — Other Ambulatory Visit: Payer: Self-pay

## 2019-10-22 ENCOUNTER — Encounter: Payer: Self-pay | Admitting: Family Medicine

## 2019-10-22 ENCOUNTER — Ambulatory Visit (INDEPENDENT_AMBULATORY_CARE_PROVIDER_SITE_OTHER): Payer: 59 | Admitting: Family Medicine

## 2019-10-22 VITALS — BP 122/68 | HR 62 | Temp 97.3°F | Ht 62.0 in | Wt 217.0 lb

## 2019-10-22 DIAGNOSIS — Z23 Encounter for immunization: Secondary | ICD-10-CM | POA: Diagnosis not present

## 2019-10-22 DIAGNOSIS — I7300 Raynaud's syndrome without gangrene: Secondary | ICD-10-CM

## 2019-10-22 DIAGNOSIS — R5383 Other fatigue: Secondary | ICD-10-CM

## 2019-10-22 DIAGNOSIS — Z8269 Family history of other diseases of the musculoskeletal system and connective tissue: Secondary | ICD-10-CM

## 2019-10-22 HISTORY — DX: Family history of other diseases of the musculoskeletal system and connective tissue: Z82.69

## 2019-10-22 HISTORY — DX: Raynaud's syndrome without gangrene: I73.00

## 2019-10-22 NOTE — Assessment & Plan Note (Addendum)
Questionable primary vs secondary  Labs ordered, will see if neurology vs rheum is needed. Might need to do a water change test at next visit.  Provided with information today in office

## 2019-10-22 NOTE — Progress Notes (Signed)
Subjective:  Patient ID: Brooke [name], female    DOB: 09-24-1970  Age: 49 y.o. MRN: [id number]  CC:  Chief Complaint  Patient presents with  . Hand Pain    joints hurt, fingers turn red, always cold. has been ongoing for months      HPI  HPI Brooke [name] is a pleasant 49 year old female patient of mine.  She presents today for an acute visit secondary to having ongoing hand pain.  Hand pain: She reports this worsened over the last several months.  She reports that she thinks it started about a year to 2 years ago.  She reports joint swelling in fingers.  Having trouble opening jars.  She reports achiness from arms, shoulders, neck.  She reports that she wakes up with achiness sometimes it sharp but usually just achy and she goes to bed with it.  She has tried some over-the-counter things without much relief.  She is unsure of temperature when her fingers are changing color.  But she does report when she washes dishes it is painful and they will turn like a pinkish color.  She reports some numbness and tingling.  Has a history of having diagnosis of carpal tunnel syndrome.  She does report that her hands are very painful when it is cold outside.  She will report that they are white at times.  She has a family history of a cousin with lupus.  Mother with fibromyalgia.  She is unsure of her father's history but he died from polio when she was about 49 years old.  Denies having any chest pain, palpitations, leg swelling, headaches, dizziness, vision changes.  Flu shot today.  Today patient denies signs and symptoms of COVID 19 infection including fever, chills, cough, shortness of breath, and headache. Past Medical, Surgical, Social History, Allergies, and Medications have been Reviewed.   Past Medical History:  Diagnosis Date  . Acid reflux   . Amenorrhea 02/06/2012  . Anxiety   . Arthritis    [name] 10/13/2019  . B12 deficiency   . Breast lump 08/06/2019  . Cervical  radiculitis   . Chest pain   . Chronic constipation   . DDD (degenerative disc disease), lumbar   . Depression   . Depression    [name] 10/13/2019  . Dizzy spells   . Elevated vitamin B12 level 05/04/2019  . Esophageal dysphagia 11/19/2012  . Facial numbness   . Fatty liver   . GERD (gastroesophageal reflux disease)    [name] 10/13/2019  . Headache(784.0) 04/01/2012  . Heart palpitations 01/2017  . High cholesterol   . History of anemia   . History of hiatal hernia   . Hypertension   . Insomnia   . Intractable migraine with visual aura and without status migrainosus 01/23/2017  . Iron deficiency anemia   . Irregular periods 08/06/2019  . Joint pain   . Lactose intolerance   . LUQ pain 11/19/2012  . Menopausal symptom 08/06/2019  . Migraines    occ  . Near syncope 01/2017  . Numbness and tingling 10/08/2016   Formatting of this note might be different from the original. ---Oct 2018-TEE----Normal left ventricular size and systolic function with no appreciable segmental abnormality. EF 60% There was no evidence of spontaneous echo contrast or thrombus in the left atrium or left atrial appendage. No significant valvular abnormalites noted Bubble study performed, this is negative.  . Numbness and tingling in left arm   . Obesity   .  Other malaise and fatigue 05/19/2012  . Panic attacks   . Sciatica   . Seizures ([place])   . Slurred speech 11/07/2016   Formatting of this note might be different from the original. ---Oct 2018-TEE----Normal left ventricular size and systolic function with no appreciable segmental abnormality. EF 60% There was no evidence of spontaneous echo contrast or thrombus in the left atrium or left atrial appendage. No significant valvular abnormalites noted Bubble study performed, this is negative.  . Small bowel obstruction ([place]) 07/20/2017  . Spells of speech arrest 01/23/2017  . Transient cerebral ischemia 10/08/2016   Formatting of this note might be different from  the original. ---Oct 2018-TEE----Normal left ventricular size and systolic function with no appreciable segmental abnormality. EF 60% There was no evidence of spontaneous echo contrast or thrombus in the left atrium or left atrial appendage. No significant valvular abnormalites noted Bubble study performed, this is negative.  . Vitamin D deficiency   . Word finding difficulty 01/23/2017    Current Meds  Medication Sig  . acetaminophen (TYLENOL) 500 MG tablet Take 1,000 mg by mouth daily as needed for moderate pain or headache.  . Alum & Mag Hydroxide-Simeth (MYLANTA PO) Take by mouth as needed.  . ARIPiprazole (ABILIFY) 2 MG tablet Take 1 tablet (2 mg total) by mouth at bedtime.  . Biotin 10000 MCG TABS Take 5,000 mcg by mouth every other day.   . calcium carbonate (OSCAL) 1500 (600 Ca) MG TABS tablet Take 600 mg by mouth daily.  . diclofenac Sodium (VOLTAREN) 1 % GEL Apply 1 g topically 3 (three) times daily as needed for pain.  . fluticasone (FLONASE) 50 MCG/ACT nasal spray Place 2 sprays into both nostrils daily.  . hydrOXYzine (ATARAX/VISTARIL) 25 MG tablet Take 1 tablet (25 mg total) by mouth daily as needed for anxiety.  . levETIRAcetam (KEPPRA) 250 MG tablet Take 250 mg by mouth at bedtime.  Marland Kitchen levocetirizine (XYZAL) 5 MG tablet Take 1 tablet (5 mg total) by mouth every evening.  . lubiprostone (AMITIZA) 24 MCG capsule Take 1 capsule (24 mcg total) by mouth 2 (two) times daily with a meal.  . Multiple Vitamins-Minerals (BARIATRIC MULTIVITAMINS/IRON PO) Take 1 tablet by mouth daily.   . pantoprazole (PROTONIX) 40 MG tablet Take 1 tablet (40 mg total) by mouth 2 (two) times daily before a meal.  . promethazine-dextromethorphan (PROMETHAZINE-DM) 6.25-15 MG/5ML syrup Take 2.5 mLs by mouth 3 (three) times daily as needed for cough.  . Semaglutide-Weight Management (WEGOVY) 0.5 MG/0.5ML SOAJ Inject 0.5 mg into the skin once a week.  . topiramate (TOPAMAX) 100 MG tablet Take 100 mg by mouth 2  (two) times daily.   Marland Kitchen venlafaxine XR (EFFEXOR-XR) 150 MG 24 hr capsule Total of 225 mg along with 75 mg tab  . Vitamin D, Ergocalciferol, (DRISDOL) 1.25 MG (50000 UNIT) CAPS capsule Take 1 capsule (50,000 Units total) by mouth every 7 (seven) days.    ROS:  Review of Systems  Constitutional: Negative.   HENT: Negative.   Eyes: Negative.   Respiratory: Negative.   Cardiovascular: Negative.   Gastrointestinal: Negative.   Genitourinary: Negative.   Musculoskeletal: Positive for joint pain and myalgias.  Skin: Negative.   Neurological: Positive for sensory change.  Endo/Heme/Allergies: Negative.   Psychiatric/Behavioral: Negative.      Objective:   Today's Vitals: BP 122/68 (BP Location: Right Arm, Patient Position: Sitting, Cuff Size: Normal)   Pulse 62   Temp (!) 97.3 F (36.3 C) (Tympanic)   Ht  5\' 2"  (1.575 m)   Wt 217 lb (98.4 kg)   LMP 10/01/2019   SpO2 98%   BMI 39.69 kg/m  Vitals with BMI 10/22/2019 10/14/2019 10/13/2019  Height 5\' 2"  5\' 2"  5\' 2"   Weight 217 lbs 215 lbs 216 lbs  BMI 39.68 17.49 44.9  Systolic 675 916 384  Diastolic 68 73 73  Pulse 62 - 88     Physical Exam Vitals and nursing note reviewed.  Constitutional:      Appearance: Normal appearance. She is well-developed and well-groomed. She is obese.  HENT:     Head: Normocephalic and atraumatic.     Right Ear: External ear normal.     Left Ear: External ear normal.     Mouth/Throat:     Comments: Mask in place  Eyes:     General:        Right eye: No discharge.        Left eye: No discharge.     Conjunctiva/sclera: Conjunctivae normal.  Cardiovascular:     Rate and Rhythm: Normal rate and regular rhythm.     Pulses: Normal pulses.     Heart sounds: Normal heart sounds.  Pulmonary:     Effort: Pulmonary effort is normal.     Breath sounds: Normal breath sounds.  Musculoskeletal:        General: Normal range of motion.     Cervical back: Normal range of motion and neck supple.   Skin:    General: Skin is warm.     Comments: No noted change in coloration today in the office    Neurological:     General: No focal deficit present.     Mental Status: She is alert and oriented to person, place, and time.     Cranial Nerves: Cranial nerves are intact.     Sensory: Sensation is intact.     Motor: Motor function is intact.     Coordination: Coordination is intact.     Gait: Gait is intact.  Psychiatric:        Attention and Perception: Attention and perception normal.        Mood and Affect: Mood and affect normal.        Speech: Speech normal.        Behavior: Behavior normal. Behavior is cooperative.        Thought Content: Thought content normal.        Cognition and Memory: Cognition and memory normal.        Judgment: Judgment normal.     Comments: Good communication       Assessment   1. Raynaud's phenomenon without gangrene   2. Fatigue, unspecified type   3. Need for immunization against influenza   4. Family history of systemic lupus erythematosus     Tests ordered Orders Placed This Encounter  Procedures  . Flu Vaccine QUAD 36+ mos IM  . ANA  . CBC  . Comprehensive metabolic panel  . C-reactive protein  . Sedimentation rate     Plan: Please see assessment and plan per problem list above.   No orders of the defined types were placed in this encounter.   Patient to follow-up in 1 month  Note: This dictation was prepared with Dragon dictation along with smaller phrase technology. Similar sounding words can be transcribed inadequately or may not be corrected upon review. Any transcriptional errors that result from this process are unintentional.      Perlie Mayo, NP

## 2019-10-22 NOTE — Patient Instructions (Addendum)
HAPPY FALL!  I appreciate the opportunity to provide you with care for your health and wellness. Today we discussed: several concerns   Follow up: 1 month -gallbladder issues  Labs- today  No referrals today  Pending labs we will decided what next steps will be.  Please see information below for what we care currently thinking is going on.  Please continue to practice social distancing to keep you, your family, and our community safe.  If you must go out, please wear a mask and practice good handwashing.  It was a pleasure to see you and I look forward to continuing to work together on your health and well-being. Please do not hesitate to call the office if you need care or have questions about your care.  Have a wonderful day and week. With Gratitude, Cherly Beach, DNP, AGNP-BC   Raynaud Phenomenon  Raynaud phenomenon is a condition that affects the blood vessels (arteries) that carry blood to your fingers and toes. The arteries that supply blood to your ears, lips, nipples, or the tip of your nose might also be affected. Raynaud phenomenon causes the arteries to become narrow temporarily (spasm). As a result, the flow of blood to the affected areas is temporarily decreased. This usually occurs in response to cold temperatures or stress. During an attack, the skin in the affected areas turns white, then blue, and finally red. You may also feel tingling or numbness in those areas. Attacks usually last for only a brief period, and then the blood flow to the area returns to normal. In most cases, Raynaud phenomenon does not cause serious health problems. What are the causes? In many cases, the cause of this condition is not known. The condition may occur on its own (primary Raynaud phenomenon) or may be associated with other diseases or factors (secondary Raynaud phenomenon). Possible causes may include:  Diseases or medical conditions that damage the arteries.  Injuries and  repetitive actions that hurt the hands or feet.  Being exposed to certain chemicals.  Taking medicines that narrow the arteries.  Other medical conditions, such as lupus, scleroderma, rheumatoid arthritis, thyroid problems, blood disorders, Sjogren syndrome, or atherosclerosis. What increases the risk? The following factors may make you more likely to develop this condition:  Being 98-30 years old.  Being female.  Having a family history of Raynaud phenomenon.  Living in a cold climate.  Smoking. What are the signs or symptoms? Symptoms of this condition usually occur when you are exposed to cold temperatures or when you have emotional stress. The symptoms may last for a few minutes or up to several hours. They usually affect your fingers but may also affect your toes, nipples, lips, ears, or the tip of your nose. Symptoms may include:  Changes in skin color. The skin in the affected areas will turn pale or white. The skin may then change from white to bluish to red as normal blood flow returns to the area.  Numbness, tingling, or pain in the affected areas. In severe cases, symptoms may include:  Skin sores.  Tissues decaying and dying (gangrene). How is this treated? Treatment for this condition often involves making lifestyle changes and taking steps to control your exposure to cold temperatures.  Follow these instructions at home: Avoiding cold temperatures Take these steps to avoid exposure to cold:  If possible, stay indoors during cold weather.  When you go outside during cold weather, dress in layers and wear mittens, a hat, a scarf, and warm  footwear.  Wear mittens or gloves when handling ice or frozen food.  Use holders for glasses or cans containing cold drinks.  Let warm water run for a while before taking a shower or bath.  Warm up the car before driving in cold weather. Lifestyle   If possible, avoid stressful and emotional situations. Try to find ways  to manage your stress, such as: ? Exercise. ? Yoga. ? Meditation. ? Biofeedback.  Do not use any products that contain nicotine or tobacco, such as cigarettes and e-cigarettes. If you need help quitting, ask your health care provider.  Avoid secondhand smoke.  Limit your use of caffeine. ? Switch to decaffeinated coffee, tea, and soda. ? Avoid chocolate.  Avoid vibrating tools and machinery. General instructions  Protect your hands and feet from injuries, cuts, or bruises.  Avoid wearing tight rings or wristbands.  Wear loose fitting socks and comfortable, roomy shoes.  Take over-the-counter and prescription medicines only as told by your health care provider. Contact a health care provider if:  Your discomfort becomes worse despite lifestyle changes.  You develop sores on your fingers or toes that do not heal.  Your fingers or toes turn black.  You have breaks in the skin on your fingers or toes.  You have a fever.  You have pain or swelling in your joints.  You have a rash.  Your symptoms occur on only one side of your body. Summary  Raynaud phenomenon is a condition that affects the arteries that carry blood to your fingers, toes, ears, lips, nipples, or the tip of your nose.  In many cases, the cause of this condition is not known.  Symptoms of this condition include changes in skin color, and numbness and tingling of the affected area.  Treatment for this condition includes lifestyle changes, reducing exposure to cold temperatures, and using medicines for severe cases of the condition.  Contact your health care provider if your condition worsens despite treatment. This information is not intended to replace advice given to you by your health care provider. Make sure you discuss any questions you have with your health care provider. Document Revised: 12/21/2016 Document Reviewed: 01/30/2016 Elsevier Patient Education  2020 [employer].

## 2019-10-22 NOTE — Assessment & Plan Note (Signed)
Hx of lupus, will get ANA to see if need further testing and possible referral.

## 2019-10-22 NOTE — Assessment & Plan Note (Signed)
Labs ordered- will plan treatment around results and S&S.

## 2019-10-22 NOTE — Assessment & Plan Note (Signed)

## 2019-10-23 ENCOUNTER — Encounter: Payer: Self-pay | Admitting: Family Medicine

## 2019-10-23 LAB — COMPREHENSIVE METABOLIC PANEL
ALT: 34 IU/L — ABNORMAL HIGH (ref 0–32)
AST: 24 IU/L (ref 0–40)
Albumin/Globulin Ratio: 1.6 (ref 1.2–2.2)
Albumin: 4.1 g/dL (ref 3.8–4.8)
Alkaline Phosphatase: 96 IU/L (ref 44–121)
BUN/Creatinine Ratio: 10 (ref 9–23)
BUN: 9 mg/dL (ref 6–24)
Bilirubin Total: 0.3 mg/dL (ref 0.0–1.2)
CO2: 21 mmol/L (ref 20–29)
Calcium: 8.8 mg/dL (ref 8.7–10.2)
Chloride: 109 mmol/L — ABNORMAL HIGH (ref 96–106)
Creatinine, Ser: 0.93 mg/dL (ref 0.57–1.00)
GFR calc Af Amer: 83 mL/min/{1.73_m2} (ref >59)
GFR calc non Af Amer: 72 mL/min/{1.73_m2} (ref >59)
Globulin, Total: 2.5 g/dL (ref 1.5–4.5)
Glucose: 81 mg/dL (ref 65–99)
Potassium: 4.7 mmol/L (ref 3.5–5.2)
Sodium: 141 mmol/L (ref 134–144)
Total Protein: 6.6 g/dL (ref 6.0–8.5)

## 2019-10-23 LAB — CBC
Hematocrit: 40 % (ref 34.0–46.6)
Hemoglobin: 13.3 g/dL (ref 11.1–15.9)
MCH: 30.6 pg (ref 26.6–33.0)
MCHC: 33.3 g/dL (ref 31.5–35.7)
MCV: 92 fL (ref 79–97)
Platelets: 349 10*3/uL (ref 150–450)
RBC: 4.35 x10E6/uL (ref 3.77–5.28)
RDW: 13 % (ref 11.7–15.4)
WBC: 5 10*3/uL (ref 3.4–10.8)

## 2019-10-23 LAB — C-REACTIVE PROTEIN: CRP: <1 mg/L (ref 0–10)

## 2019-10-23 LAB — SEDIMENTATION RATE: Sed Rate: 5 mm/hr (ref 0–32)

## 2019-10-26 ENCOUNTER — Encounter: Payer: Self-pay | Admitting: Family Medicine

## 2019-10-27 NOTE — Telephone Encounter (Signed)
Check on ANA and call her once you can. Let me know if we need to reorder ANA or what happened to it. Thank you

## 2019-10-28 ENCOUNTER — Encounter: Payer: Self-pay | Admitting: Family Medicine

## 2019-10-28 NOTE — Telephone Encounter (Signed)
Please call and ask if she is willing to see Neurology to see what they might think. If so place referral to Dr Merlene Laughter as he also does pain management if it is needed in future. Thank you

## 2019-10-29 ENCOUNTER — Telehealth: Payer: Self-pay | Admitting: Gastroenterology

## 2019-10-29 DIAGNOSIS — R1013 Epigastric pain: Secondary | ICD-10-CM

## 2019-10-29 DIAGNOSIS — R1011 Right upper quadrant pain: Secondary | ICD-10-CM

## 2019-10-29 NOTE — Telephone Encounter (Signed)
Patient with mychart messages today regarding ruq pain/epig pain with radiation into back worse with meals. Feels nauseated. She is on PPI BID. Recent abd u/s no gallstones.   Let's get HIDA with fatty meal.  Needs ov to follow.

## 2019-10-30 ENCOUNTER — Telehealth: Payer: Self-pay

## 2019-10-30 ENCOUNTER — Other Ambulatory Visit: Payer: Self-pay

## 2019-10-30 NOTE — Telephone Encounter (Signed)
HIDA scheduled for 11/09/19 at 10:00am, arrive at 9:45am. NPO and no pain meds after midnight prior to test.  Called and informed pt of appt. She had seen appt on MyChart. Appt letter also mailed.

## 2019-10-30 NOTE — Addendum Note (Signed)
Addended by: Hassan Rowan on: 10/30/2019 09:35 AM   Modules accepted: Orders

## 2019-10-30 NOTE — Telephone Encounter (Signed)
Can you follow up with her on this? Unsure if Brooke [name] got to this week. Thank you

## 2019-10-30 NOTE — Addendum Note (Signed)
Addended by: Hassan Rowan on: 10/30/2019 09:36 AM   Modules accepted: Orders

## 2019-10-30 NOTE — Telephone Encounter (Signed)
Called patient to try and find out which neurologist she wanted to see. No answer, lvm

## 2019-10-30 NOTE — Telephone Encounter (Signed)
PA for HIDA submitted via [employer] website. Case pending. Ref# [id number].

## 2019-11-02 NOTE — Telephone Encounter (Signed)
See mychart notes.

## 2019-11-02 NOTE — Telephone Encounter (Signed)
HIDA approved. Ref# [id number], valid 10/30/19-01/28/20.

## 2019-11-02 NOTE — Progress Notes (Signed)
Virtual Visit via Video Note  I connected with Brooke [name] on 11/10/19 at 12:00 PM EST by a video enabled telemedicine application and verified that I am speaking with the correct person using two identifiers.  Location: Patient: home Provider: home office   I discussed the limitations of evaluation and management by telemedicine and the availability of in person appointments. The patient expressed understanding and agreed to proceed.    I discussed the assessment and treatment plan with the patient. The patient was provided an opportunity to ask questions and all were answered. The patient agreed with the plan and demonstrated an understanding of the instructions.   The patient was advised to call back or seek an in-person evaluation if the symptoms worsen or if the condition fails to improve as anticipated.  I provided 15 minutes of non-face-to-face time during this encounter.   Brooke Clay, MD    [facility] MD/PA/NP OP Progress Note  11/10/2019 12:25 PM Brooke [name]  MRN:  [id number]  Chief Complaint:  Chief Complaint    Depression; Follow-up     HPI:  This is a follow-up appointment for depression.  She states that she has had to be having mood swing and wonders if she has bipolar disorder.  She has been irritable especially at her husband.  She states that her cousin has been doing well after she went back to school.  She states that there was a day she had passive SI a few weeks ago. She asked her son to get her into some room as she wanted to get away from things.  She felt her nerves were torn up.  She was very frustrated that she could not get things done.  She denies any current SI, or any plans or intent.  She has insomnia.  She feels drowsy during the day.  She wonders if those are side effects from the medication.  She has fair appetite.  She has started to go to the wellness center, and has been working on diet and exercise.  She denies significant change in her  appetite or weight.  She has fair concentration.  She feels anxious and tense at times.  She has occasional panic attacks.  She denies decreased need for sleep or euphoria.    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 The patient has a custody), grandson , age 49 stays with her during the day Marital status: married Number of children: 2 (age 14, 49) She describes her childhood as "rough," and "poor." Her father was murdered, and she never met him. Her step father was strict. She reports good relationship with her mother. She stayed with her grandmother in summer time. She has started to work at 49 year old.  Education: some college  Visit Diagnosis:    ICD-10-CM   1. MDD (major depressive disorder), recurrent episode, mild ([place])  F33.0   2. Panic attacks  F41.0   3. Uncomplicated alcohol dependence ([place])  F10.20     Past Psychiatric History: Please see initial evaluation for full details. I have reviewed the history. No updates at this time.     Past Medical History:  Past Medical History:  Diagnosis Date  . Acid reflux   . Amenorrhea 02/06/2012  . Anxiety   .  Arthritis    [name] 10/13/2019  . B12 deficiency   . Breast lump 08/06/2019  . Cervical radiculitis   . Chest pain   . Chronic constipation   . DDD (degenerative disc disease), lumbar   . Depression   . Depression    [name] 10/13/2019  . Dizzy spells   . Elevated vitamin B12 level 05/04/2019  . Esophageal dysphagia 11/19/2012  . Facial numbness   . Fatty liver   . GERD (gastroesophageal reflux disease)    [name] 10/13/2019  . Headache(784.0) 04/01/2012  . Heart palpitations 01/2017  . High cholesterol   . History of anemia   . History of hiatal hernia   . Hypertension   . Insomnia   .  Intractable migraine with visual aura and without status migrainosus 01/23/2017  . Iron deficiency anemia   . Irregular periods 08/06/2019  . Joint pain   . Lactose intolerance   . LUQ pain 11/19/2012  . Menopausal symptom 08/06/2019  . Migraines    occ  . Near syncope 01/2017  . Numbness and tingling 10/08/2016   Formatting of this note might be different from the original. ---Oct 2018-TEE----Normal left ventricular size and systolic function with no appreciable segmental abnormality. EF 60% There was no evidence of spontaneous echo contrast or thrombus in the left atrium or left atrial appendage. No significant valvular abnormalites noted Bubble study performed, this is negative.  . Numbness and tingling in left arm   . Obesity   . Other malaise and fatigue 05/19/2012  . Panic attacks   . Sciatica   . Seizures ([place])   . Slurred speech 11/07/2016   Formatting of this note might be different from the original. ---Oct 2018-TEE----Normal left ventricular size and systolic function with no appreciable segmental abnormality. EF 60% There was no evidence of spontaneous echo contrast or thrombus in the left atrium or left atrial appendage. No significant valvular abnormalites noted Bubble study performed, this is negative.  . Small bowel obstruction ([place]) 07/20/2017  . Spells of speech arrest 01/23/2017  . Transient cerebral ischemia 10/08/2016   Formatting of this note might be different from the original. ---Oct 2018-TEE----Normal left ventricular size and systolic function with no appreciable segmental abnormality. EF 60% There was no evidence of spontaneous echo contrast or thrombus in the left atrium or left atrial appendage. No significant valvular abnormalites noted Bubble study performed, this is negative.  . Vitamin D deficiency   . Word finding difficulty 01/23/2017    Past Surgical History:  Procedure Laterality Date  . BUNIONECTOMY Left yrs ago  . CERVICAL ABLATION  2017  . COLONOSCOPY,  ESOPHAGOGASTRODUODENOSCOPY (EGD) AND ESOPHAGEAL DILATION N/A 12/03/2012   GOT:LXBWIOMB melanosis throughout the entire examined colon/The colon IS redundant/Small internal hemorrhoids/EGD:Esophageal web/Medium sized hiatal hernia/MILD Non-erosive gastritis  . ESOPHAGOGASTRODUODENOSCOPY  03/09/09   Dr. Wilford Corner, normal EGD, s/p Bravo capsule placement  . ESOPHAGOGASTRODUODENOSCOPY N/A 06/24/2019   rourk: Status post gastric bypass procedure, normal esophagus status post dilation  . EYE SURGERY N/A    [name] 10/13/2019  . GASTRIC ROUX-EN-Y N/A 07/16/2017   Procedure: LAPAROSCOPIC ROUX-EN-Y GASTRIC BYPASS WITH UPPER ENDOSCOPY AND ERAS PATHWAY;  Surgeon: Johnathan Hausen, MD;  Location: [facility];  Service: General;  Laterality: N/A;  . LAPAROSCOPY N/A 07/20/2017   Procedure: LAPAROSCOPY DIAGNOSTIC. REDUCTION OF SMALL BOWEL OBSTRUCTION. REPAIR OF TROCAR HERNIA.;  Surgeon: Alphonsa Overall, MD;  Location: [facility];  Service: General;  Laterality: N/A;  . Venia Minks DILATION N/A 06/24/2019   Procedure: Venia Minks  DILATION;  Surgeon: Daneil Dolin, MD;  Location: [facility];  Service: Endoscopy;  Laterality: N/A;  . TUBAL LIGATION    . WISDOM TOOTH EXTRACTION      Family Psychiatric History: Please see initial evaluation for full details. I have reviewed the history. No updates at this time.     Family History:  Family History  Problem Relation Age of Onset  . Diabetes Mother   . Hypertension Mother   . Drug abuse Mother   . Anxiety disorder Mother   . Depression Mother   . CAD Mother        CABG in 95s  . Lung cancer Mother   . Hyperlipidemia Mother   . Heart disease Mother   . Cancer Mother   . Obesity Mother   . Hypertension Father   . Sudden death Father   . Diabetes Sister   . Hypertension Sister   . Hypertension Brother   . Drug abuse Brother   . CAD Brother        s/p CABG in 27s  . Diabetes Paternal Grandmother   . Hypertension Brother   . Drug abuse Brother   . CAD Brother         "HEart artery blockages" in 35s  . Hypertension Brother   . Drug abuse Brother   . Anxiety disorder Maternal Grandmother   . Depression Maternal Grandmother   . Breast cancer Maternal Grandmother        breast  . Asthma Other   . Heart disease Other   . Colon cancer Neg Hx     Social History:  Social History   Socioeconomic History  . Marital status: Married    Spouse name: Randall Hiss   . Number of children: 2  . Years of education: Not on file  . Highest education level: Some college, no degree  Occupational History  . Occupation: stay at home  Tobacco Use  . Smoking status: Former Smoker    Packs/day: 0.30    Years: 23.00    Pack years: 6.90    Types: Cigarettes    Quit date: 10/04/2012    Years since quitting: 7.1  . Smokeless tobacco: Never Used  . Tobacco comment: less than 1/2 pack cigarettes daily  Vaping Use  . Vaping Use: Never used  Substance and Sexual Activity  . Alcohol use: Yes    Comment: weekends  . Drug use: No  . Sexual activity: Yes    Partners: Male    Birth control/protection: Surgical  Other Topics Concern  . Not on file  Social History Narrative   Lives with husband, married 81 years    91 son Teron    89 son Jiles Harold -two grandchildren    Live close by    Rising cousin-custody of her daughter 58 [address]       Right handed   Pets: none      Enjoys: [employer], shopping, likes being outside       Diet: eggs, oatmeal, salad, all food groups no lot of proteins, good on veggies.    Caffeine: sweet tea-2 cups  Coffee-1 cup daily    Water: 2-3 16 oz bottles daily       Wears seat belt    Smoke and carbon monoxide detectors   Does use phone while driving but hands free   Social Determinants of Health   Financial Resource Strain: Low Risk   . Difficulty of Paying Living Expenses: Not hard at  all  Food Insecurity: No Food Insecurity  . Worried About Charity fundraiser in the Last Year: Never true  . Ran Out of Food in the Last Year: Never  true  Transportation Needs: No Transportation Needs  . Lack of Transportation (Medical): No  . Lack of Transportation (Non-Medical): No  Physical Activity: Inactive  . Days of Exercise per Week: 0 days  . Minutes of Exercise per Session: 0 min  Stress: Stress Concern Present  . Feeling of Stress : To some extent  Social Connections: Moderately Integrated  . Frequency of Communication with Friends and Family: More than three times a week  . Frequency of Social Gatherings with Friends and Family: More than three times a week  . Attends Religious Services: More than 4 times per year  . Active Member of Clubs or Organizations: No  . Attends Archivist Meetings: Never  . Marital Status: Married    Allergies: No Known Allergies  Metabolic Disorder Labs: Lab Results  Component Value Date   HGBA1C 5.2 09/14/2019   MPG 105 08/05/2019   No results found for: PROLACTIN Lab Results  Component Value Date   CHOL 184 09/14/2019   TRIG 84 09/14/2019   HDL 60 09/14/2019   CHOLHDL 3.4 08/05/2019   VLDL 13 05/13/2012   LDLCALC 109 (H) 09/14/2019   LDLCALC 127 (H) 08/05/2019   Lab Results  Component Value Date   TSH 1.390 09/14/2019   TSH 1.48 02/17/2019    Therapeutic Level Labs: No results found for: LITHIUM No results found for: VALPROATE No components found for:  CBMZ  Current Medications: Current Outpatient Medications  Medication Sig Dispense Refill  . acetaminophen (TYLENOL) 500 MG tablet Take 1,000 mg by mouth daily as needed for moderate pain or headache.    . Alum & Mag Hydroxide-Simeth (MYLANTA PO) Take by mouth as needed.    . ARIPiprazole (ABILIFY) 5 MG tablet Take 1 tablet (5 mg total) by mouth daily. 30 tablet 1  . Biotin 10000 MCG TABS Take 5,000 mcg by mouth every other day.     . calcium carbonate (OSCAL) 1500 (600 Ca) MG TABS tablet Take 600 mg by mouth daily.    . diclofenac Sodium (VOLTAREN) 1 % GEL Apply 1 g topically 3 (three) times daily as needed  for pain.    . fluticasone (FLONASE) 50 MCG/ACT nasal spray Place 2 sprays into both nostrils daily. 16 g 1  . hydrOXYzine (ATARAX/VISTARIL) 25 MG tablet Take 1 tablet (25 mg total) by mouth daily as needed for anxiety. 90 tablet 0  . levETIRAcetam (KEPPRA) 250 MG tablet Take 250 mg by mouth at bedtime.    Marland Kitchen levocetirizine (XYZAL) 5 MG tablet Take 1 tablet (5 mg total) by mouth every evening. 30 tablet 2  . lubiprostone (AMITIZA) 24 MCG capsule Take 1 capsule (24 mcg total) by mouth 2 (two) times daily with a meal. 180 capsule 3  . Multiple Vitamins-Minerals (BARIATRIC MULTIVITAMINS/IRON PO) Take 1 tablet by mouth daily.     . pantoprazole (PROTONIX) 40 MG tablet Take 1 tablet (40 mg total) by mouth 2 (two) times daily before a meal. 180 tablet 3  . Semaglutide-Weight Management (WEGOVY) 0.5 MG/0.5ML SOAJ Inject 0.5 mg into the skin once a week. 2 mL 0  . topiramate (TOPAMAX) 100 MG tablet Take 100 mg by mouth 2 (two) times daily.     Marland Kitchen venlafaxine XR (EFFEXOR-XR) 150 MG 24 hr capsule Total of 225 mg along with  75 mg tab 90 capsule 0  . venlafaxine XR (EFFEXOR-XR) 75 MG 24 hr capsule 225 mg daily. Take along with 150 mg cap 90 capsule 0  . Vitamin D, Ergocalciferol, (DRISDOL) 1.25 MG (50000 UNIT) CAPS capsule Take 1 capsule (50,000 Units total) by mouth every 7 (seven) days. 12 capsule 1   No current facility-administered medications for this visit.     Musculoskeletal: Strength & Muscle Tone: N/A Gait & Station: N/A Patient leans: N/A  Psychiatric Specialty Exam: Review of Systems  Psychiatric/Behavioral: Positive for dysphoric mood, sleep disturbance and suicidal ideas. Negative for agitation, behavioral problems, confusion, decreased concentration, hallucinations and self-injury. The patient is nervous/anxious. The patient is not hyperactive.   All other systems reviewed and are negative.   There were no vitals taken for this visit.There is no height or weight on file to calculate BMI.   General Appearance: Fairly Groomed  Eye Contact:  Good  Speech:  Clear and Coherent  Volume:  Normal  Mood:  irritable  Affect:  Appropriate, Congruent and euthymic  Thought Process:  Coherent  Orientation:  Full (Time, Place, and Person)  Thought Content: Logical   Suicidal Thoughts:  Yes.  without intent/plan  Homicidal Thoughts:  No  Memory:  Immediate;   Good  Judgement:  Good  Insight:  Fair  Psychomotor Activity:  Normal  Concentration:  Concentration: Good and Attention Span: Good  Recall:  Good  Fund of Knowledge: Good  Language: Good  Akathisia:  No  Handed:  Right  AIMS (if indicated): not done  Assets:  Communication Skills Desire for Improvement  ADL's:  Intact  Cognition: WNL  Sleep:  Poor   Screenings: GAD-7     Office Visit from 10/22/2019 in [facility] Video Visit from 09/03/2019 in [facility] Video Visit from 08/06/2019 in [facility] Office Visit from 05/04/2019 in [facility]  Total GAD-7 Score _0 PHQ2-9     Office Visit from 10/22/2019 in [facility] Video Visit from 10/14/2019 in [facility] Video Visit from 09/24/2019 in [facility] Office Visit from 09/14/2019 in [facility] Video Visit from 09/03/2019 in [facility]  PHQ-2 Total Score 2 2 0 6 2  PHQ-9 Total Score 9 17 -- 21 7       Assessment and Plan:  Brooke [name] is a 49 y.o. year old female with a history of depression,spells of unresponsiveness, followed by neurology, migraine, hypertension, GERD, s/pRYGB 07/2017, mild obstructive sleep apnea, who presents for follow up appointment for below.   1. MDD (major depressive disorder), recurrent episode, mild ([place]) 2. Panic attacks She continues to have irritability and depressive symptoms since the last visit.  Psychosocial stressors includes occasional marital conflict, being a caregiver of her cousin, loss of her husband's  uncle, unemployment, demoralization due to pain/seizure like episodes, and her mother being diagnosed with lung cancer.   We uptitrate Abilify as adjunctive treatment for depression.  Discussed potential metabolic side effect and EPS.  Will continue venlafaxine to target depression and panic attacks.  Will discontinue hydroxyzine at this time given her somnolence during the day.   3. Uncomplicated alcohol dependence ([place]) She has been able to cut down alcohol use.  We will continue motivational interview.   Plan 1.Continuevenlafaxine245m daily  2. Increase Abilify 5 mg daily 3. Discontinue hydroxyzine due to concern of drowsiness 4. Next appointment:12/21 at 10:40 for 30 mins, video -  hold clonazepam - She had PSG in 2019;IMPRESSION: 1. Mild Obstructive Sleep Apnea at AHI 4.2 /h - not enough to need intervention (OSA),2. Moderate Severe Periodic Limb Movement Disorder (PLMD),3. Normal REM latency.  Past trials of medication:sertraline, fluoxetine, Effexor (sick), bupropion,   Emergency resources which includes 911, ED, suicide crisis line [phone]) are discussed.   I have reviewed suicide assessment in detail. No change in the following assessment.   The patient demonstrates the following risk factors for suicide: Chronic risk factors for suicide include:psychiatric disorder ofdepression, OCDand chronic pain. Acute risk factorsfor suicide include: unemployment. Protective factorsfor this patient include: positive social support, responsibility to others (children, family), coping skills and hope for the future. Although she has guns at home, it is in a safe and she does not have access to keys. Considering these factors, the overall suicide risk at this point appears to below. Patientisappropriate for outpatient follow up.   Brooke Clay, MD 11/10/2019, 12:25 PM

## 2019-11-02 NOTE — Telephone Encounter (Signed)
Spoke with [facility] and they do not have ANA results, she was not sure what happened there. This would need to be drawn.

## 2019-11-03 ENCOUNTER — Other Ambulatory Visit: Payer: Self-pay

## 2019-11-03 DIAGNOSIS — G40909 Epilepsy, unspecified, not intractable, without status epilepticus: Secondary | ICD-10-CM

## 2019-11-03 DIAGNOSIS — M1990 Unspecified osteoarthritis, unspecified site: Secondary | ICD-10-CM

## 2019-11-03 NOTE — Telephone Encounter (Signed)
Please ask her if she wants to have this checked or wait for her neurologist to see her first.  If wants, please place order again.

## 2019-11-03 NOTE — Telephone Encounter (Signed)
Pt says she will wait to see Neurologist. New referral was sent for pt.

## 2019-11-05 ENCOUNTER — Encounter (INDEPENDENT_AMBULATORY_CARE_PROVIDER_SITE_OTHER): Payer: Self-pay | Admitting: Family Medicine

## 2019-11-05 ENCOUNTER — Ambulatory Visit (INDEPENDENT_AMBULATORY_CARE_PROVIDER_SITE_OTHER): Payer: 59 | Admitting: Family Medicine

## 2019-11-05 ENCOUNTER — Other Ambulatory Visit: Payer: Self-pay

## 2019-11-05 VITALS — BP 127/85 | HR 85 | Temp 98.5°F | Ht 62.0 in | Wt 212.0 lb

## 2019-11-05 DIAGNOSIS — Z6838 Body mass index (BMI) 38.0-38.9, adult: Secondary | ICD-10-CM | POA: Diagnosis not present

## 2019-11-05 DIAGNOSIS — E559 Vitamin D deficiency, unspecified: Secondary | ICD-10-CM | POA: Diagnosis not present

## 2019-11-05 MED ORDER — WEGOVY 0.5 MG/0.5ML ~~LOC~~ SOAJ: 0.5000 mg | SUBCUTANEOUS | 0 refills | Status: DC

## 2019-11-09 ENCOUNTER — Other Ambulatory Visit: Payer: Self-pay

## 2019-11-09 ENCOUNTER — Ambulatory Visit (HOSPITAL_COMMUNITY)
Admission: RE | Admit: 2019-11-09 | Discharge: 2019-11-09 | Disposition: A | Discharge status: Home / Self Care | Payer: 59 | Source: Ambulatory Visit | Attending: Gastroenterology | Admitting: Gastroenterology

## 2019-11-09 DIAGNOSIS — R1011 Right upper quadrant pain: Secondary | ICD-10-CM | POA: Diagnosis not present

## 2019-11-09 DIAGNOSIS — R1013 Epigastric pain: Secondary | ICD-10-CM | POA: Insufficient documentation

## 2019-11-09 MED ORDER — TECHNETIUM TC 99M MEBROFENIN IV KIT
5.0000 | PACK | Freq: Once | INTRAVENOUS | Status: AC | PRN
  Administered 2019-11-09: 5.2 via INTRAVENOUS

## 2019-11-09 NOTE — Progress Notes (Signed)
Chief Complaint:   OBESITY  Belissa is here to discuss her progress with her obesity treatment plan along with follow-up of her obesity related diagnoses. Rivka is on the [employer] and states she is following her eating plan approximately 75% of the time. Wannetta states she is walking for 30 minutes 4-5 times per week.  Today's visit was #: 4 Starting weight: 217 lbs Starting date: 09/14/2019 Today's weight: 212 lbs Today's date: 11/05/2019 Total lbs lost to date: 5 Total lbs lost since last in-office visit: 4  Interim History: Amariyana is on Wegovy with minimal side effects (feels occasional nausea). She is not able to get all of the food in on the plan. She is not eating much meat, so she is doing most options that are meatless. She has no plans for the upcoming few weeks. She has normal holiday plans. Her biggest obstacle in the upcoming weeks is sweets.  Subjective:   1. Vitamin D deficiency Carloyn denies nausea, vomiting, or muscle weakness, but she notes fatigue. She is on prescription Vit D.  Assessment/Plan:   1. Vitamin D deficiency Low Vitamin D level contributes to fatigue and are associated with obesity, breast, and colon cancer. Niajah agreed to continue taking prescription Vitamin D 50,000 IU every week, no refill needed. She will follow-up for routine testing of Vitamin D, at least 2-3 times per year to avoid over-replacement.  2. Class 2 severe obesity with serious comorbidity and body mass index (BMI) of 38.0 to 38.9 in adult, unspecified obesity type (HCC) Aemilia is currently in the action stage of change. As such, her goal is to continue with weight loss efforts. She has agreed to the [employer].   We discussed various medication options to help [place] with her weight loss efforts and we both agreed to continue [place], and we will refill for 1 month.  - Semaglutide-Weight Management (WEGOVY) 0.5 MG/0.5ML SOAJ; Inject 0.5 mg into the skin once a week.   Dispense: 2 mL; Refill: 0  Exercise goals: As is.  Behavioral modification strategies: increasing lean protein intake, meal planning and cooking strategies, keeping healthy foods in the home and planning for success.  Elaysia has agreed to follow-up with our clinic in 3 to 4 weeks. She was informed of the importance of frequent follow-up visits to maximize her success with intensive lifestyle modifications for her multiple health conditions.   Objective:   Blood pressure 127/85, pulse 85, temperature 98.5 F (36.9 C), height 5\' 2"  (1.575 m), weight 212 lb (96.2 kg), SpO2 100 %. Body mass index is 38.78 kg/m.  General: Cooperative, alert, well developed, in no acute distress. HEENT: Conjunctivae and lids unremarkable. Cardiovascular: Regular rhythm.  Lungs: Normal work of breathing. Neurologic: No focal deficits.   Lab Results  Component Value Date   CREATININE 0.93 10/22/2019   BUN 9 10/22/2019   NA 141 10/22/2019   K 4.7 10/22/2019   CL 109 (H) 10/22/2019   CO2 21 10/22/2019   Lab Results  Component Value Date   ALT 34 (H) 10/22/2019   AST 24 10/22/2019   ALKPHOS 96 10/22/2019   BILITOT 0.3 10/22/2019   Lab Results  Component Value Date   HGBA1C 5.2 09/14/2019   HGBA1C 5.3 08/05/2019   Lab Results  Component Value Date   INSULIN 5.9 09/14/2019   Lab Results  Component Value Date   TSH 1.390 09/14/2019   Lab Results  Component Value Date   CHOL 184 09/14/2019  HDL 60 09/14/2019   LDLCALC 109 (H) 09/14/2019   TRIG 84 09/14/2019   CHOLHDL 3.4 08/05/2019   Lab Results  Component Value Date   WBC 5.0 10/22/2019   HGB 13.3 10/22/2019   HCT 40.0 10/22/2019   MCV 92 10/22/2019   PLT 349 10/22/2019   Lab Results  Component Value Date   IRON 111 09/03/2019   TIBC 292 09/03/2019   FERRITIN 187 09/03/2019   Attestation Statements:   Reviewed by clinician on day of visit: allergies, medications, problem list, medical history, surgical history, family  history, social history, and previous encounter notes.  Time spent on visit including pre-visit chart review and post-visit care and charting was 17 minutes.    I, Trixie Dredge, am acting as transcriptionist for Coralie Common, MD.  I have reviewed the above documentation for accuracy and completeness, and I agree with the above. - Jinny Blossom, MD

## 2019-11-10 ENCOUNTER — Telehealth (HOSPITAL_COMMUNITY): Payer: Self-pay | Admitting: Psychiatry

## 2019-11-10 ENCOUNTER — Telehealth (INDEPENDENT_AMBULATORY_CARE_PROVIDER_SITE_OTHER): Payer: 59 | Admitting: Psychiatry

## 2019-11-10 ENCOUNTER — Encounter: Payer: Self-pay | Admitting: Psychiatry

## 2019-11-10 ENCOUNTER — Telehealth (HOSPITAL_COMMUNITY): Payer: 59 | Admitting: Psychiatry

## 2019-11-10 DIAGNOSIS — F1020 Alcohol dependence, uncomplicated: Secondary | ICD-10-CM

## 2019-11-10 DIAGNOSIS — F330 Major depressive disorder, recurrent, mild: Secondary | ICD-10-CM | POA: Diagnosis not present

## 2019-11-10 DIAGNOSIS — F410 Panic disorder [episodic paroxysmal anxiety] without agoraphobia: Secondary | ICD-10-CM

## 2019-11-10 MED ORDER — VENLAFAXINE HCL ER 75 MG PO CP24: ORAL_CAPSULE | ORAL | 0 refills | Status: DC

## 2019-11-10 MED ORDER — ARIPIPRAZOLE 5 MG PO TABS: 5.0000 mg | ORAL_TABLET | Freq: Every day | ORAL | 1 refills | Status: DC

## 2019-11-10 NOTE — Patient Instructions (Signed)
1.Continuevenlafaxine225mg  daily  2. Increase Abilify 5 mg daily 3. Discontinue hydroxyzine due to concern of drowsiness 4. Next appointment:12/21 at 10:40

## 2019-11-10 NOTE — Telephone Encounter (Signed)
Called the pt to schedule new patient appt for therapy with Maye Hides (per [name]). Left detailed voicemail to return call and schedule.

## 2019-11-14 ENCOUNTER — Emergency Department (HOSPITAL_COMMUNITY)
Admission: EM | Admit: 2019-11-14 | Discharge: 2019-11-14 | Disposition: A | Discharge status: Home / Self Care | Payer: 59 | Attending: Emergency Medicine | Admitting: Emergency Medicine

## 2019-11-14 ENCOUNTER — Other Ambulatory Visit: Payer: Self-pay

## 2019-11-14 ENCOUNTER — Encounter (HOSPITAL_COMMUNITY): Payer: Self-pay

## 2019-11-14 DIAGNOSIS — Z87891 Personal history of nicotine dependence: Secondary | ICD-10-CM | POA: Diagnosis not present

## 2019-11-14 DIAGNOSIS — B349 Viral infection, unspecified: Secondary | ICD-10-CM | POA: Insufficient documentation

## 2019-11-14 DIAGNOSIS — Z20822 Contact with and (suspected) exposure to covid-19: Secondary | ICD-10-CM | POA: Insufficient documentation

## 2019-11-14 DIAGNOSIS — I10 Essential (primary) hypertension: Secondary | ICD-10-CM | POA: Diagnosis not present

## 2019-11-14 DIAGNOSIS — R509 Fever, unspecified: Secondary | ICD-10-CM | POA: Diagnosis present

## 2019-11-14 LAB — RESPIRATORY PANEL BY RT PCR (FLU A&B, COVID)
Influenza A by PCR: NEGATIVE
Influenza B by PCR: NEGATIVE
SARS Coronavirus 2 by RT PCR: NEGATIVE

## 2019-11-14 MED ORDER — ONDANSETRON HCL 4 MG PO TABS: 4.0000 mg | ORAL_TABLET | Freq: Three times a day (TID) | ORAL | 0 refills | Status: DC | PRN

## 2019-11-14 NOTE — ED Triage Notes (Signed)
Pt presents to ED with complaints of generalized body aches, chills, headache, nasal drainage started last night.

## 2019-11-14 NOTE — ED Provider Notes (Signed)
[facility] EMERGENCY DEPARTMENT Provider Note   CSN: [id number] Arrival date & time: 11/14/19  1659     History Chief Complaint  Patient presents with  . Generalized Body Aches    Brooke [name] is a 49 y.o. female.  HPI   Patient with significant medical history of GERD and hypertension presents to the emergency department with chief complaint of URI-like symptoms.  Patient states last night she developed fevers, chills, ear pain, nasal congestion, sore throat, and general body aches, she endorses nausea without any vomiting.  She denies abdominal pain, diarrhea, constipation, or urinary symptoms.  She denies  recent sick contacts, recent travels and is Covid vaccinated.  Patient states she is tolerating p.o. without difficulty.  She denies any alleviating or aggravating factors at this time.  Patient denies cough, chest pain, shortness of breath, abdominal pain, dysuria, worsening pedal edema.  Past Medical History:  Diagnosis Date  . Acid reflux   . Amenorrhea 02/06/2012  . Anxiety   . Arthritis    [name] 10/13/2019  . B12 deficiency   . Breast lump 08/06/2019  . Cervical radiculitis   . Chest pain   . Chronic constipation   . DDD (degenerative disc disease), lumbar   . Depression   . Depression    [name] 10/13/2019  . Dizzy spells   . Elevated vitamin B12 level 05/04/2019  . Esophageal dysphagia 11/19/2012  . Facial numbness   . Fatty liver   . GERD (gastroesophageal reflux disease)    [name] 10/13/2019  . Headache(784.0) 04/01/2012  . Heart palpitations 01/2017  . High cholesterol   . History of anemia   . History of hiatal hernia   . Hypertension   . Insomnia   . Intractable migraine with visual aura and without status migrainosus 01/23/2017  . Iron deficiency anemia   . Irregular periods 08/06/2019  . Joint pain   . Lactose intolerance   . LUQ pain 11/19/2012  . Menopausal symptom 08/06/2019  . Migraines    occ  . Near syncope 01/2017  . Numbness and  tingling 10/08/2016   Formatting of this note might be different from the original. ---Oct 2018-TEE----Normal left ventricular size and systolic function with no appreciable segmental abnormality. EF 60% There was no evidence of spontaneous echo contrast or thrombus in the left atrium or left atrial appendage. No significant valvular abnormalites noted Bubble study performed, this is negative.  . Numbness and tingling in left arm   . Obesity   . Other malaise and fatigue 05/19/2012  . Panic attacks   . Sciatica   . Seizures ([place])   . Slurred speech 11/07/2016   Formatting of this note might be different from the original. ---Oct 2018-TEE----Normal left ventricular size and systolic function with no appreciable segmental abnormality. EF 60% There was no evidence of spontaneous echo contrast or thrombus in the left atrium or left atrial appendage. No significant valvular abnormalites noted Bubble study performed, this is negative.  . Small bowel obstruction ([place]) 07/20/2017  . Spells of speech arrest 01/23/2017  . Transient cerebral ischemia 10/08/2016   Formatting of this note might be different from the original. ---Oct 2018-TEE----Normal left ventricular size and systolic function with no appreciable segmental abnormality. EF 60% There was no evidence of spontaneous echo contrast or thrombus in the left atrium or left atrial appendage. No significant valvular abnormalites noted Bubble study performed, this is negative.  . Vitamin D deficiency   . Word finding difficulty 01/23/2017  Patient Active Problem List   Diagnosis Date Noted  . Raynaud's phenomenon without gangrene 10/22/2019  . Fatigue 10/22/2019  . Need for immunization against influenza 10/22/2019  . Family history of systemic lupus erythematosus 10/22/2019  . Environmental and seasonal allergies 10/14/2019  . Cough 10/14/2019  . Generalized anxiety disorder with panic attacks 08/20/2019  . Arthritis 08/06/2019  . Back problem  08/06/2019  . Severe carpal tunnel syndrome of both wrists 08/06/2019  . Acute cystitis without hematuria 08/06/2019  . Elevated liver enzymes 08/06/2019  . Vitamin D deficiency 05/04/2019  . Mixed hyperglyceridemia 01/23/2019  . Precordial pain 01/23/2019  . Mixed obsessional thoughts and acts 12/12/2018  . Seizure disorder ([place]) 10/13/2018  . 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 Hypersomnia due to another medical condition 06/12/2017  . Morbid obesity with body mass index (BMI) of 40.0 to 44.9 in adult [facility]) 01/23/2017  . Sleep deprivation 01/23/2017  . Vertigo 10/08/2016  . Blurred vision 10/08/2016  . Chronic migraine 10/08/2016  . Seizures ([place]) 09/25/2016  . Morbid obesity ([place]) 12/03/2012  . Depression, major, single episode, moderate ([place]) 02/06/2012  . Panic attacks 02/06/2012  . NICOTINE ADDICTION 03/04/2009  . GERD 03/04/2009  . CONSTIPATION, CHRONIC 03/04/2009    Past Surgical History:  Procedure Laterality Date  . BUNIONECTOMY Left yrs ago  . CERVICAL ABLATION  2017  . COLONOSCOPY, ESOPHAGOGASTRODUODENOSCOPY (EGD) AND ESOPHAGEAL DILATION N/A 12/03/2012   GEX:[id number] melanosis throughout the entire examined colon/The colon IS redundant/Small internal hemorrhoids/EGD:Esophageal web/Medium sized hiatal hernia/MILD Non-erosive gastritis  . ESOPHAGOGASTRODUODENOSCOPY  03/09/09   Dr. Wilford Corner, normal EGD, s/p Bravo capsule placement  . ESOPHAGOGASTRODUODENOSCOPY N/A 06/24/2019   rourk: Status post gastric bypass procedure, normal esophagus status post dilation  . EYE SURGERY N/A    [name] 10/13/2019  . GASTRIC ROUX-EN-Y N/A 07/16/2017   Procedure: LAPAROSCOPIC ROUX-EN-Y GASTRIC BYPASS WITH UPPER ENDOSCOPY AND ERAS PATHWAY;  Surgeon: Johnathan Hausen, MD;  Location: [facility] ORS;  Service: General;  Laterality: N/A;  . LAPAROSCOPY N/A 07/20/2017   Procedure: LAPAROSCOPY DIAGNOSTIC. REDUCTION OF SMALL BOWEL OBSTRUCTION. REPAIR OF TROCAR HERNIA.;  Surgeon:  Alphonsa Overall, MD;  Location: [facility] ORS;  Service: General;  Laterality: N/A;  Venia Minks DILATION N/A 06/24/2019   Procedure: Keturah Shavers;  Surgeon: Daneil Dolin, MD;  Location: [facility] SUITE;  Service: Endoscopy;  Laterality: N/A;  . TUBAL LIGATION    . WISDOM TOOTH EXTRACTION       OB History    Gravida  3   Para      Term      Preterm      AB  1   Living  2     SAB      TAB      Ectopic      Multiple      Live Births  2           Family History  Problem Relation Age of Onset  . Diabetes Mother   . Hypertension Mother   . Drug abuse Mother   . Anxiety disorder Mother   . Depression Mother   . CAD Mother        CABG in 66s  . Lung cancer Mother   . Hyperlipidemia Mother   . Heart disease Mother   . Cancer Mother   . Obesity Mother   . Hypertension Father   . Sudden death Father   . Diabetes Sister   . Hypertension Sister   .  Hypertension Brother   . Drug abuse Brother   . CAD Brother        s/p CABG in 88s  . Diabetes Paternal Grandmother   . Hypertension Brother   . Drug abuse Brother   . CAD Brother        "HEart artery blockages" in 44s  . Hypertension Brother   . Drug abuse Brother   . Anxiety disorder Maternal Grandmother   . Depression Maternal Grandmother   . Breast cancer Maternal Grandmother        breast  . Asthma Other   . Heart disease Other   . Colon cancer Neg Hx     Social History   Tobacco Use  . Smoking status: Former Smoker    Packs/day: 0.30    Years: 23.00    Pack years: 6.90    Types: Cigarettes    Quit date: 10/04/2012    Years since quitting: 7.1  . Smokeless tobacco: Never Used  . Tobacco comment: less than 1/2 pack cigarettes daily  Vaping Use  . Vaping Use: Never used  Substance Use Topics  . Alcohol use: Yes    Comment: weekends  . Drug use: No    Home Medications Prior to Admission medications   Medication Sig Start Date End Date Taking? Authorizing Provider  acetaminophen (TYLENOL) 500 MG  tablet Take 1,000 mg by mouth daily as needed for moderate pain or headache.    [provider]  Alum & Mag Hydroxide-Simeth (MYLANTA PO) Take by mouth as needed.    [provider]  ARIPiprazole (ABILIFY) 5 MG tablet Take 1 tablet (5 mg total) by mouth daily. 11/10/19   [name], MD  Biotin 10000 MCG TABS Take 5,000 mcg by mouth every other day.     [provider]  calcium carbonate (OSCAL) 1500 (600 Ca) MG TABS tablet Take 600 mg by mouth daily.    [provider]  diclofenac Sodium (VOLTAREN) 1 % GEL Apply 1 g topically 3 (three) times daily as needed for pain. 06/06/19   [provider]  fluticasone (FLONASE) 50 MCG/ACT nasal spray Place 2 sprays into both nostrils daily. 10/14/19   Perlie Mayo, NP  hydrOXYzine (ATARAX/VISTARIL) 25 MG tablet Take 1 tablet (25 mg total) by mouth daily as needed for anxiety. 09/29/19   [name], MD  levETIRAcetam (KEPPRA) 250 MG tablet Take 250 mg by mouth at bedtime.    [provider]  levocetirizine (XYZAL) 5 MG tablet Take 1 tablet (5 mg total) by mouth every evening. 10/14/19   Perlie Mayo, NP  lubiprostone (AMITIZA) 24 MCG capsule Take 1 capsule (24 mcg total) by mouth 2 (two) times daily with a meal. 08/24/19   [name] Menghini, PA-C  Multiple Vitamins-Minerals (BARIATRIC MULTIVITAMINS/IRON PO) Take 1 tablet by mouth daily.     [provider]  ondansetron (ZOFRAN) 4 MG tablet Take 1 tablet (4 mg total) by mouth every 8 (eight) hours as needed for nausea or vomiting. 11/14/19   Marcello Fennel, PA-C  pantoprazole (PROTONIX) 40 MG tablet Take 1 tablet (40 mg total) by mouth 2 (two) times daily before a meal. 08/24/19   [name] Menghini, PA-C  Semaglutide-Weight Management (WEGOVY) 0.5 MG/0.5ML SOAJ Inject 0.5 mg into the skin once a week. 11/05/19   Laqueta Linden, MD  topiramate (TOPAMAX) 100 MG tablet Take 100 mg by mouth 2 (two) times daily.     [provider]   venlafaxine  XR (EFFEXOR-XR) 150 MG 24 hr capsule Total of 225 mg along with 75 mg tab 09/29/19   [name], MD  venlafaxine XR (EFFEXOR-XR) 75 MG 24 hr capsule 225 mg daily. Take along with 150 mg cap 11/10/19   Hisada, Elie Goody, MD  Vitamin D, Ergocalciferol, (DRISDOL) 1.25 MG (50000 UNIT) CAPS capsule Take 1 capsule (50,000 Units total) by mouth every 7 (seven) days. 08/07/19   Perlie Mayo, NP    Allergies    Patient has no known allergies.  Review of Systems   Review of Systems  Constitutional: Positive for chills and fever.  HENT: Positive for congestion. Negative for sore throat.   Eyes: Positive for pain. Negative for visual disturbance.  Respiratory: Negative for shortness of breath.   Cardiovascular: Negative for chest pain.  Gastrointestinal: Positive for nausea. Negative for abdominal pain, diarrhea and vomiting.  Genitourinary: Negative for dysuria, enuresis and flank pain.  Musculoskeletal: Negative for back pain.  Skin: Negative for rash.  Neurological: Negative for dizziness and headaches.  Hematological: Does not bruise/bleed easily.    Physical Exam Updated Vital Signs BP (!) 131/53   Pulse 60   Temp 98.9 F (37.2 C) (Oral)   Resp 19   Ht 5\' 2"  (1.575 m)   Wt 90.7 kg   LMP 11/01/2019   SpO2 100%   BMI 36.58 kg/m   Physical Exam Vitals and nursing note reviewed.  Constitutional:      General: She is not in acute distress.    Appearance: She is not ill-appearing.  HENT:     Head: Normocephalic and atraumatic.     Right Ear: Tympanic membrane, ear canal and external ear normal.     Left Ear: Tympanic membrane, ear canal and external ear normal.     Nose: Congestion present.     Mouth/Throat:     Mouth: Mucous membranes are moist.     Pharynx: Oropharynx is clear. No oropharyngeal exudate or posterior oropharyngeal erythema.  Eyes:     Conjunctiva/sclera: Conjunctivae normal.  Cardiovascular:     Rate and Rhythm: Normal rate and regular rhythm.      Pulses: Normal pulses.     Heart sounds: No murmur heard.  No friction rub. No gallop.   Pulmonary:     Effort: No respiratory distress.     Breath sounds: No wheezing, rhonchi or rales.  Abdominal:     Palpations: Abdomen is soft.     Tenderness: There is no abdominal tenderness.  Musculoskeletal:     Right lower leg: No edema.     Left lower leg: No edema.  Skin:    General: Skin is warm and dry.  Neurological:     Mental Status: She is alert.  Psychiatric:        Mood and Affect: Mood normal.     ED Results / Procedures / Treatments   Labs (all labs ordered are listed, but only abnormal results are displayed) Labs Reviewed  RESPIRATORY PANEL BY RT PCR (FLU A&B, COVID)    EKG None  Radiology No results found.  Procedures Procedures (including critical care time)  Medications Ordered in ED Medications - No data to display  ED Course  I have reviewed the triage vital signs and the nursing notes.  Pertinent labs & imaging results that were available during my care of the patient were reviewed by me and considered in my medical decision making (see chart for details).    MDM Rules/Calculators/A&P  Patient presents with URI-like symptoms.  She is alert, does not appear in acute distress, vital signs reassuring.  Will order respiratory panel for further evaluation.  Patient's respiratory panel sent for Covid, influenza a/B.  Low suspicion for systemic infection as patient is nontoxic-appearing, vital signs reassuring, no obvious source infection noted on exam.  Low suspicion for pneumonia as lung sounds are clear bilaterally.  I have low suspicion for PE patient is PERC. low suspicion for strep throat as oropharynx was visualized, no erythema or exudates noted.  Low suspicion patient would need  hospitalized due to viral infection or Covid as vital signs reassuring, patient is not in respiratory distress.  Suspect patient suffering from a URI  will recommend over-the-counter pain medications and follow-up PCP for further evaluation management.  Vital signs have remained stable, no indication for hospital admission. Patient given at home care as well strict return precautions.  Patient verbalized that they understood agreed to said plan.    Final Clinical Impression(s) / ED Diagnoses Final diagnoses:  Viral illness    Rx / DC Orders ED Discharge Orders         Ordered    ondansetron (ZOFRAN) 4 MG tablet  Every 8 hours PRN        11/14/19 2252           Marcello Fennel, PA-C 11/14/19 2315    Lajean Saver, MD 11/15/19 1529

## 2019-11-14 NOTE — Discharge Instructions (Signed)
You have been seen here for URI like symptoms.  I recommend taking Tylenol for fever control and ibuprofen for pain control please follow dosing on the back of bottle.  I recommend staying hydrated and if you do not an appetite, I recommend soups as this will provide you with fluids and calories.  Your Covid test is pending I recommend self quarantine until you get your results back on MyChart.  If you are Covid positive you must self quarantine for 10 days starting on symptom onset.  I would like you to contact "post Covid care" as they will provide you with information how to manage your Covid symptoms.  Please follow-up with PCP if Covid negative and symptoms persist over one week  Come back to the emergency department if you develop chest pain, shortness of breath, severe abdominal pain, uncontrolled nausea, vomiting, diarrhea.

## 2019-11-16 ENCOUNTER — Encounter: Payer: Self-pay | Admitting: Family Medicine

## 2019-11-16 ENCOUNTER — Other Ambulatory Visit: Payer: Self-pay

## 2019-11-16 ENCOUNTER — Encounter: Payer: Self-pay | Admitting: Nurse Practitioner

## 2019-11-16 ENCOUNTER — Telehealth (INDEPENDENT_AMBULATORY_CARE_PROVIDER_SITE_OTHER): Payer: 59 | Admitting: Nurse Practitioner

## 2019-11-16 DIAGNOSIS — B349 Viral infection, unspecified: Secondary | ICD-10-CM | POA: Diagnosis not present

## 2019-11-16 DIAGNOSIS — R110 Nausea: Secondary | ICD-10-CM | POA: Diagnosis not present

## 2019-11-16 MED ORDER — PROMETHAZINE HCL 12.5 MG PO TABS: 12.5000 mg | ORAL_TABLET | Freq: Three times a day (TID) | ORAL | 0 refills | Status: DC | PRN

## 2019-11-16 NOTE — Patient Instructions (Signed)
Viral Illness/Nausea  -her COVID/flu testing was negative on Saturday -she was treated with zofran by ED, but this is not helping -Rx. Promethazine -if no improvement in 3 days, will consider abx or f/u appt in-office

## 2019-11-16 NOTE — Telephone Encounter (Signed)
Pt made a phone visit with Donneta Romberg 11-16-19

## 2019-11-16 NOTE — Progress Notes (Addendum)
Acute Office Visit  Subjective:    Patient ID: Brooke [name], female    DOB: 1970-02-08, 49 y.o.   MRN: [id number]  Chief Complaint  Patient presents with  . Nausea    x 3 days   . Headache    x 3 days   . Anorexia    x 3 days   . Fever    x 3 days   . Chills    x 3 days     HPI Patient is in today for acute illness.  She has nausea, headache, loss of appetite, chills, and fever.  She states that she was treated with ondansetron for her nausea, and she is taking this q8h as prescribed.  She denies sinus congestion, wheezing, cough, SOB.  She had COVID testing that was negative for flu/COVID. She states she has constipation, and that is normal for her.  She states that she feel like her "head is in a tunnel".  Past Medical History:  Diagnosis Date  . Acid reflux   . Amenorrhea 02/06/2012  . Anxiety   . Arthritis    [name] 10/13/2019  . B12 deficiency   . Breast lump 08/06/2019  . Cervical radiculitis   . Chest pain   . Chronic constipation   . DDD (degenerative disc disease), lumbar   . Depression   . Depression    [name] 10/13/2019  . Dizzy spells   . Elevated vitamin B12 level 05/04/2019  . Esophageal dysphagia 11/19/2012  . Facial numbness   . Fatty liver   . GERD (gastroesophageal reflux disease)    [name] 10/13/2019  . Headache(784.0) 04/01/2012  . Heart palpitations 01/2017  . High cholesterol   . History of anemia   . History of hiatal hernia   . Hypertension   . Insomnia   . Intractable migraine with visual aura and without status migrainosus 01/23/2017  . Iron deficiency anemia   . Irregular periods 08/06/2019  . Joint pain   . Lactose intolerance   . LUQ pain 11/19/2012  . Menopausal symptom 08/06/2019  . Migraines    occ  . Near syncope 01/2017  . Numbness and tingling 10/08/2016   Formatting of this note might be different from the original. ---Oct 2018-TEE----Normal left ventricular size and systolic function with no appreciable segmental  abnormality. EF 60% There was no evidence of spontaneous echo contrast or thrombus in the left atrium or left atrial appendage. No significant valvular abnormalites noted Bubble study performed, this is negative.  . Numbness and tingling in left arm   . Obesity   . Other malaise and fatigue 05/19/2012  . Panic attacks   . Sciatica   . Seizures ([place])   . Slurred speech 11/07/2016   Formatting of this note might be different from the original. ---Oct 2018-TEE----Normal left ventricular size and systolic function with no appreciable segmental abnormality. EF 60% There was no evidence of spontaneous echo contrast or thrombus in the left atrium or left atrial appendage. No significant valvular abnormalites noted Bubble study performed, this is negative.  . Small bowel obstruction ([place]) 07/20/2017  . Spells of speech arrest 01/23/2017  . Transient cerebral ischemia 10/08/2016   Formatting of this note might be different from the original. ---Oct 2018-TEE----Normal left ventricular size and systolic function with no appreciable segmental abnormality. EF 60% There was no evidence of spontaneous echo contrast or thrombus in the left atrium or left atrial appendage. No significant valvular abnormalites noted Bubble study performed, this  is negative.  . Vitamin D deficiency   . Word finding difficulty 01/23/2017    Past Surgical History:  Procedure Laterality Date  . BUNIONECTOMY Left yrs ago  . CERVICAL ABLATION  2017  . COLONOSCOPY, ESOPHAGOGASTRODUODENOSCOPY (EGD) AND ESOPHAGEAL DILATION N/A 12/03/2012   BZJ:IRCVELFY melanosis throughout the entire examined colon/The colon IS redundant/Small internal hemorrhoids/EGD:Esophageal web/Medium sized hiatal hernia/MILD Non-erosive gastritis  . ESOPHAGOGASTRODUODENOSCOPY  03/09/09   Dr. Wilford Corner, normal EGD, s/p Bravo capsule placement  . ESOPHAGOGASTRODUODENOSCOPY N/A 06/24/2019   rourk: Status post gastric bypass procedure, normal esophagus status post  dilation  . EYE SURGERY N/A    [name] 10/13/2019  . GASTRIC ROUX-EN-Y N/A 07/16/2017   Procedure: LAPAROSCOPIC ROUX-EN-Y GASTRIC BYPASS WITH UPPER ENDOSCOPY AND ERAS PATHWAY;  Surgeon: Johnathan Hausen, MD;  Location: [facility];  Service: General;  Laterality: N/A;  . LAPAROSCOPY N/A 07/20/2017   Procedure: LAPAROSCOPY DIAGNOSTIC. REDUCTION OF SMALL BOWEL OBSTRUCTION. REPAIR OF TROCAR HERNIA.;  Surgeon: Alphonsa Overall, MD;  Location: [facility];  Service: General;  Laterality: N/A;  Venia Minks DILATION N/A 06/24/2019   Procedure: Keturah Shavers;  Surgeon: Daneil Dolin, MD;  Location: [facility];  Service: Endoscopy;  Laterality: N/A;  . TUBAL LIGATION    . WISDOM TOOTH EXTRACTION      Family History  Problem Relation Age of Onset  . Diabetes Mother   . Hypertension Mother   . Drug abuse Mother   . Anxiety disorder Mother   . Depression Mother   . CAD Mother        CABG in 89s  . Lung cancer Mother   . Hyperlipidemia Mother   . Heart disease Mother   . Cancer Mother   . Obesity Mother   . Hypertension Father   . Sudden death Father   . Diabetes Sister   . Hypertension Sister   . Hypertension Brother   . Drug abuse Brother   . CAD Brother        s/p CABG in 58s  . Diabetes Paternal Grandmother   . Hypertension Brother   . Drug abuse Brother   . CAD Brother        "HEart artery blockages" in 28s  . Hypertension Brother   . Drug abuse Brother   . Anxiety disorder Maternal Grandmother   . Depression Maternal Grandmother   . Breast cancer Maternal Grandmother        breast  . Asthma Other   . Heart disease Other   . Colon cancer Neg Hx     Social History   Socioeconomic History  . Marital status: Married    Spouse name: Randall Hiss   . Number of children: 2  . Years of education: Not on file  . Highest education level: Some college, no degree  Occupational History  . Occupation: stay at home  Tobacco Use  . Smoking status: Former Smoker    Packs/day: 0.30    Years:  23.00    Pack years: 6.90    Types: Cigarettes    Quit date: 10/04/2012    Years since quitting: 7.1  . Smokeless tobacco: Never Used  . Tobacco comment: less than 1/2 pack cigarettes daily  Vaping Use  . Vaping Use: Never used  Substance and Sexual Activity  . Alcohol use: Yes    Comment: weekends  . Drug use: No  . Sexual activity: Yes    Partners: Male    Birth control/protection: Surgical  Other Topics Concern  . Not  on file  Social History Narrative   Lives with husband, married 12 years    20 son Teron    28 son Jiles Harold -two grandchildren    Live close by    Rising cousin-custody of her daughter 17 [place]       Right handed   Pets: none      Enjoys: [employer], shopping, likes being outside       Diet: eggs, oatmeal, salad, all food groups no lot of proteins, good on veggies.    Caffeine: sweet tea-2 cups  Coffee-1 cup daily    Water: 2-3 16 oz bottles daily       Wears seat belt    Smoke and carbon monoxide detectors   Does use phone while driving but hands free   Social Determinants of Health   Financial Resource Strain: Low Risk   . Difficulty of Paying Living Expenses: Not hard at all  Food Insecurity: No Food Insecurity  . Worried About Charity fundraiser in the Last Year: Never true  . Ran Out of Food in the Last Year: Never true  Transportation Needs: No Transportation Needs  . Lack of Transportation (Medical): No  . Lack of Transportation (Non-Medical): No  Physical Activity: Inactive  . Days of Exercise per Week: 0 days  . Minutes of Exercise per Session: 0 min  Stress: Stress Concern Present  . Feeling of Stress : To some extent  Social Connections: Moderately Integrated  . Frequency of Communication with Friends and Family: More than three times a week  . Frequency of Social Gatherings with Friends and Family: More than three times a week  . Attends Religious Services: More than 4 times per year  . Active Member of Clubs or Organizations: No  .  Attends Archivist Meetings: Never  . Marital Status: Married  Human resources officer Violence: Not At Risk  . Fear of Current or Ex-Partner: No  . Emotionally Abused: No  . Physically Abused: No  . Sexually Abused: No    Outpatient Medications Prior to Visit  Medication Sig Dispense Refill  . acetaminophen (TYLENOL) 500 MG tablet Take 1,000 mg by mouth daily as needed for moderate pain or headache.    . Alum & Mag Hydroxide-Simeth (MYLANTA PO) Take by mouth as needed.    . ARIPiprazole (ABILIFY) 5 MG tablet Take 1 tablet (5 mg total) by mouth daily. 30 tablet 1  . Biotin 10000 MCG TABS Take 5,000 mcg by mouth every other day.     . calcium carbonate (OSCAL) 1500 (600 Ca) MG TABS tablet Take 600 mg by mouth daily.    . diclofenac Sodium (VOLTAREN) 1 % GEL Apply 1 g topically 3 (three) times daily as needed for pain.    . fluticasone (FLONASE) 50 MCG/ACT nasal spray Place 2 sprays into both nostrils daily. 16 g 1  . hydrOXYzine (ATARAX/VISTARIL) 25 MG tablet Take 1 tablet (25 mg total) by mouth daily as needed for anxiety. 90 tablet 0  . levETIRAcetam (KEPPRA) 250 MG tablet Take 250 mg by mouth at bedtime.    Marland Kitchen levocetirizine (XYZAL) 5 MG tablet Take 1 tablet (5 mg total) by mouth every evening. 30 tablet 2  . lubiprostone (AMITIZA) 24 MCG capsule Take 1 capsule (24 mcg total) by mouth 2 (two) times daily with a meal. 180 capsule 3  . Multiple Vitamins-Minerals (BARIATRIC MULTIVITAMINS/IRON PO) Take 1 tablet by mouth daily.     . ondansetron (ZOFRAN) 4 MG tablet  Take 1 tablet (4 mg total) by mouth every 8 (eight) hours as needed for nausea or vomiting. 12 tablet 0  . pantoprazole (PROTONIX) 40 MG tablet Take 1 tablet (40 mg total) by mouth 2 (two) times daily before a meal. 180 tablet 3  . Semaglutide-Weight Management (WEGOVY) 0.5 MG/0.5ML SOAJ Inject 0.5 mg into the skin once a week. 2 mL 0  . topiramate (TOPAMAX) 100 MG tablet Take 100 mg by mouth 2 (two) times daily.     Marland Kitchen  venlafaxine XR (EFFEXOR-XR) 150 MG 24 hr capsule Total of 225 mg along with 75 mg tab 90 capsule 0  . venlafaxine XR (EFFEXOR-XR) 75 MG 24 hr capsule 225 mg daily. Take along with 150 mg cap 90 capsule 0  . Vitamin D, Ergocalciferol, (DRISDOL) 1.25 MG (50000 UNIT) CAPS capsule Take 1 capsule (50,000 Units total) by mouth every 7 (seven) days. 12 capsule 1   No facility-administered medications prior to visit.    No Known Allergies  Review of Systems  Respiratory: Negative for shortness of breath and wheezing.   Gastrointestinal: Positive for constipation and nausea. Negative for abdominal distention, abdominal pain, diarrhea, rectal pain and vomiting.  Neurological: Positive for headaches.       Objective:    Physical Exam  LMP 11/01/2019  Wt Readings from Last 3 Encounters:  11/14/19 200 lb (90.7 kg)  11/05/19 212 lb (96.2 kg)  10/22/19 217 lb (98.4 kg)    Health Maintenance Due  Topic Date Due  . TETANUS/TDAP  Never done    There are no preventive care reminders to display for this patient.   Lab Results  Component Value Date   TSH 1.390 09/14/2019   Lab Results  Component Value Date   WBC 5.0 10/22/2019   HGB 13.3 10/22/2019   HCT 40.0 10/22/2019   MCV 92 10/22/2019   PLT 349 10/22/2019   Lab Results  Component Value Date   NA 141 10/22/2019   K 4.7 10/22/2019   CO2 21 10/22/2019   GLUCOSE 81 10/22/2019   BUN 9 10/22/2019   CREATININE 0.93 10/22/2019   BILITOT 0.3 10/22/2019   ALKPHOS 96 10/22/2019   AST 24 10/22/2019   ALT 34 (H) 10/22/2019   PROT 6.6 10/22/2019   ALBUMIN 4.1 10/22/2019   CALCIUM 8.8 10/22/2019   ANIONGAP 8 10/15/2018   Lab Results  Component Value Date   CHOL 184 09/14/2019   Lab Results  Component Value Date   HDL 60 09/14/2019   Lab Results  Component Value Date   LDLCALC 109 (H) 09/14/2019   Lab Results  Component Value Date   TRIG 84 09/14/2019   Lab Results  Component Value Date   CHOLHDL 3.4 08/05/2019    Lab Results  Component Value Date   HGBA1C 5.2 09/14/2019       Assessment & Plan:   Problem List Items Addressed This Visit    None    Visit Diagnoses    Viral illness    -  Primary    -her COVID/flu testing was negative on Saturday -she was treated with zofran by ED, but this is not helping -Rx. Promethazine -if no improvement in 3 days, will consider abx or f/u appt in-office   Location of Patient: Home Location of Provider: Office Consent was obtain for visit to be over via telehealth. I verified that I am speaking with the correct person using two identifiers.   I connected with  [name] on  11/16/19 via telephone and verified that I am speaking with the correct person using two identifiers.   I discussed the limitations of evaluation and management by telemedicine. The patient expressed understanding and agreed to proceed.    No orders of the defined types were placed in this encounter.    Noreene Larsson, NP

## 2019-11-25 ENCOUNTER — Telehealth (INDEPENDENT_AMBULATORY_CARE_PROVIDER_SITE_OTHER): Payer: 59 | Admitting: Family Medicine

## 2019-11-25 ENCOUNTER — Encounter: Payer: Self-pay | Admitting: Family Medicine

## 2019-11-25 ENCOUNTER — Other Ambulatory Visit: Payer: Self-pay

## 2019-11-25 DIAGNOSIS — R748 Abnormal levels of other serum enzymes: Secondary | ICD-10-CM | POA: Diagnosis not present

## 2019-11-25 NOTE — Progress Notes (Signed)
Virtual Visit via Telephone Note   This visit type was conducted due to national recommendations for restrictions regarding the COVID-19 Pandemic (e.g. social distancing) in an effort to limit this patient's exposure and mitigate transmission in our community.  Due to her co-morbid illnesses, this patient is at least at moderate risk for complications without adequate follow up.  This format is felt to be most appropriate for this patient at this time.  The patient did not have access to video technology/had technical difficulties with video requiring transitioning to audio format only (telephone).  All issues noted in this document were discussed and addressed.  No physical exam could be performed with this format.    Evaluation Performed:  Follow-up visit  Date:  11/25/2019   ID:  Brooke [name], DOB 10/01/1970, MRN [id number]  Patient Location: Home Provider Location: Office/Clinic  Location of Patient: Home Location of Provider: Telehealth Consent was obtain for visit to be over via telehealth. I verified that I am speaking with the correct person using two identifiers.  PCP:  Perlie Mayo, NP   Chief Complaint: Gallbladder/abdominal discomfort follow-up  History of Present Illness:    Brooke [name] is a 49 y.o. female with history as stated below. Has recently had a HIDA scan which demonstrated normal gallbladder findings.  She did have some fatty liver noted on abdominal ultrasound secondary to elevated LFTs.  She reports that she feels like there is something in her upper abdomen area but she is just unsure of what is going on as this can still show anything.  She is educated about fatty liver and how to help this.  And that sometimes enlargement of the liver can cause a change in sensation.  Overall she reports that she is doing well and she does not have any other issues or concerns.  The patient does not have symptoms concerning for COVID-19 infection (fever, chills,  cough, or new shortness of breath).   Past Medical, Surgical, Social History, Allergies, and Medications have been Reviewed.  Past Medical History:  Diagnosis Date  . Acid reflux   . Amenorrhea 02/06/2012  . Anxiety   . Arthritis    [name] 10/13/2019  . B12 deficiency   . Breast lump 08/06/2019  . Cervical radiculitis   . Chest pain   . Chronic constipation   . DDD (degenerative disc disease), lumbar   . Depression   . Depression    [name] 10/13/2019  . Dizzy spells   . Elevated vitamin B12 level 05/04/2019  . Esophageal dysphagia 11/19/2012  . Facial numbness   . Fatty liver   . GERD (gastroesophageal reflux disease)    [name] 10/13/2019  . Headache(784.0) 04/01/2012  . Heart palpitations 01/2017  . High cholesterol   . History of anemia   . History of hiatal hernia   . Hypertension   . Insomnia   . Intractable migraine with visual aura and without status migrainosus 01/23/2017  . Iron deficiency anemia   . Irregular periods 08/06/2019  . Joint pain   . Lactose intolerance   . LUQ pain 11/19/2012  . Menopausal symptom 08/06/2019  . Migraines    occ  . Near syncope 01/2017  . Numbness and tingling 10/08/2016   Formatting of this note might be different from the original. ---Oct 2018-TEE----Normal left ventricular size and systolic function with no appreciable segmental abnormality. EF 60% There was no evidence of spontaneous echo contrast or thrombus in the left atrium or  left atrial appendage. No significant valvular abnormalites noted Bubble study performed, this is negative.  . Numbness and tingling in left arm   . Obesity   . Other malaise and fatigue 05/19/2012  . Panic attacks   . Sciatica   . Seizures ([place])   . Slurred speech 11/07/2016   Formatting of this note might be different from the original. ---Oct 2018-TEE----Normal left ventricular size and systolic function with no appreciable segmental abnormality. EF 60% There was no evidence of spontaneous echo contrast  or thrombus in the left atrium or left atrial appendage. No significant valvular abnormalites noted Bubble study performed, this is negative.  . Small bowel obstruction ([place]) 07/20/2017  . Spells of speech arrest 01/23/2017  . Transient cerebral ischemia 10/08/2016   Formatting of this note might be different from the original. ---Oct 2018-TEE----Normal left ventricular size and systolic function with no appreciable segmental abnormality. EF 60% There was no evidence of spontaneous echo contrast or thrombus in the left atrium or left atrial appendage. No significant valvular abnormalites noted Bubble study performed, this is negative.  . Vitamin D deficiency   . Word finding difficulty 01/23/2017   Past Surgical History:  Procedure Laterality Date  . BUNIONECTOMY Left yrs ago  . CERVICAL ABLATION  2017  . COLONOSCOPY, ESOPHAGOGASTRODUODENOSCOPY (EGD) AND ESOPHAGEAL DILATION N/A 12/03/2012   WGN:FAOZHYQM melanosis throughout the entire examined colon/The colon IS redundant/Small internal hemorrhoids/EGD:Esophageal web/Medium sized hiatal hernia/MILD Non-erosive gastritis  . ESOPHAGOGASTRODUODENOSCOPY  03/09/09   Dr. Wilford Corner, normal EGD, s/p Bravo capsule placement  . ESOPHAGOGASTRODUODENOSCOPY N/A 06/24/2019   rourk: Status post gastric bypass procedure, normal esophagus status post dilation  . EYE SURGERY N/A    [name] 10/13/2019  . GASTRIC ROUX-EN-Y N/A 07/16/2017   Procedure: LAPAROSCOPIC ROUX-EN-Y GASTRIC BYPASS WITH UPPER ENDOSCOPY AND ERAS PATHWAY;  Surgeon: Johnathan Hausen, MD;  Location: [facility];  Service: General;  Laterality: N/A;  . LAPAROSCOPY N/A 07/20/2017   Procedure: LAPAROSCOPY DIAGNOSTIC. REDUCTION OF SMALL BOWEL OBSTRUCTION. REPAIR OF TROCAR HERNIA.;  Surgeon: Alphonsa Overall, MD;  Location: [facility];  Service: General;  Laterality: N/A;  Venia Minks DILATION N/A 06/24/2019   Procedure: Keturah Shavers;  Surgeon: Daneil Dolin, MD;  [facility];  Service: Endoscopy;   Laterality: N/A;  . TUBAL LIGATION    . WISDOM TOOTH EXTRACTION       Current Meds  Medication Sig  . acetaminophen (TYLENOL) 500 MG tablet Take 1,000 mg by mouth daily as needed for moderate pain or headache.  . Alum & Mag Hydroxide-Simeth (MYLANTA PO) Take by mouth as needed.  . ARIPiprazole (ABILIFY) 5 MG tablet Take 1 tablet (5 mg total) by mouth daily.  . Biotin 10000 MCG TABS Take 5,000 mcg by mouth every other day.   . calcium carbonate (OSCAL) 1500 (600 Ca) MG TABS tablet Take 600 mg by mouth daily.  . diclofenac Sodium (VOLTAREN) 1 % GEL Apply 1 g topically 3 (three) times daily as needed for pain.  . fluticasone (FLONASE) 50 MCG/ACT nasal spray Place 2 sprays into both nostrils daily.  . hydrOXYzine (ATARAX/VISTARIL) 25 MG tablet Take 1 tablet (25 mg total) by mouth daily as needed for anxiety.  . levETIRAcetam (KEPPRA) 250 MG tablet Take 250 mg by mouth at bedtime.  Marland Kitchen levocetirizine (XYZAL) 5 MG tablet Take 1 tablet (5 mg total) by mouth every evening.  . lubiprostone (AMITIZA) 24 MCG capsule Take 1 capsule (24 mcg total) by mouth 2 (two) times daily with a  meal.  . Multiple Vitamins-Minerals (BARIATRIC MULTIVITAMINS/IRON PO) Take 1 tablet by mouth daily.   . ondansetron (ZOFRAN) 4 MG tablet Take 1 tablet (4 mg total) by mouth every 8 (eight) hours as needed for nausea or vomiting.  . pantoprazole (PROTONIX) 40 MG tablet Take 1 tablet (40 mg total) by mouth 2 (two) times daily before a meal.  . Semaglutide-Weight Management (WEGOVY) 0.5 MG/0.5ML SOAJ Inject 0.5 mg into the skin once a week.  . topiramate (TOPAMAX) 100 MG tablet Take 100 mg by mouth 2 (two) times daily.   Marland Kitchen venlafaxine XR (EFFEXOR-XR) 150 MG 24 hr capsule Total of 225 mg along with 75 mg tab  . venlafaxine XR (EFFEXOR-XR) 75 MG 24 hr capsule 225 mg daily. Take along with 150 mg cap  . Vitamin D, Ergocalciferol, (DRISDOL) 1.25 MG (50000 UNIT) CAPS capsule Take 1 capsule (50,000 Units total) by mouth every 7 (seven)  days.     Allergies:   Patient has no known allergies.   ROS:   Please see the history of present illness.    All other systems reviewed and are negative.   Labs/Other Tests and Data Reviewed:    Recent Labs: 09/14/2019: TSH 1.390 10/22/2019: ALT 34; BUN 9; Creatinine, Ser 0.93; Hemoglobin 13.3; Platelets 349; Potassium 4.7; Sodium 141   Recent Lipid Panel Lab Results  Component Value Date/Time   CHOL 184 09/14/2019 10:14 AM   TRIG 84 09/14/2019 10:14 AM   HDL 60 09/14/2019 10:14 AM   CHOLHDL 3.4 08/05/2019 09:04 AM   LDLCALC 109 (H) 09/14/2019 10:14 AM   LDLCALC 127 (H) 08/05/2019 09:04 AM    Wt Readings from Last 3 Encounters:  11/25/19 200 lb (90.7 kg)  11/14/19 200 lb (90.7 kg)  11/05/19 212 lb (96.2 kg)     Objective:    Vital Signs:  Ht 5\' 2"  (1.575 m)   Wt 200 lb (90.7 kg)   LMP 11/23/2019   BMI 36.58 kg/m    VITAL SIGNS:  reviewed GEN:  no acute distress RESPIRATORY:  no shortness of breath in conversations PSYCH:  normal affect  ASSESSMENT & PLAN:    1. Morbid obesity ([place])   2. Elevated liver enzymes   Time:   Today, I have spent 5 minutes with the patient with telehealth technology discussing the above problems.     Medication Adjustments/Labs and Tests Ordered: Current medicines are reviewed at length with the patient today.  Concerns regarding medicines are outlined above.   Tests Ordered: No orders of the defined types were placed in this encounter.   Medication Changes: No orders of the defined types were placed in this encounter.    Note: This dictation was prepared with Dragon dictation along with smaller phrase technology. Similar sounding words can be transcribed inadequately or may not be corrected upon review. Any transcriptional errors that result from this process are unintentional.      Disposition:  Follow up as scheduled   Signed, Perlie Mayo, NP  11/25/2019 9:24 AM     [place]

## 2019-11-25 NOTE — Assessment & Plan Note (Addendum)
Gallbladder scan was good- normal Still reports a feeling that something is in her side area. No other symptoms at this time. Encouraged diet for weight loss and limited alcohol use. Advised follow up with GI

## 2019-11-25 NOTE — Assessment & Plan Note (Signed)
She is encouraged to focus on a low fat low alcohol diet to help with elevated LFT's and weight loss.

## 2019-11-25 NOTE — Patient Instructions (Signed)
  HAPPY FALL!  I appreciate the opportunity to provide you with care for your health and wellness. Today we discussed: Follow-up from GI  Follow up: As scheduled   No labs or referrals today  HAPPY THANKSGIVING!  Please continue to practice social distancing to keep you, your family, and our community safe.  If you must go out, please wear a mask and practice good handwashing.  It was a pleasure to see you and I look forward to continuing to work together on your health and well-being. Please do not hesitate to call the office if you need care or have questions about your care.  Have a wonderful day and week. With Gratitude, Cherly Beach, DNP, AGNP-BC

## 2019-12-02 ENCOUNTER — Encounter: Payer: Self-pay | Admitting: Gastroenterology

## 2019-12-02 ENCOUNTER — Other Ambulatory Visit: Payer: Self-pay

## 2019-12-02 ENCOUNTER — Telehealth (INDEPENDENT_AMBULATORY_CARE_PROVIDER_SITE_OTHER): Payer: 59 | Admitting: Gastroenterology

## 2019-12-02 ENCOUNTER — Ambulatory Visit (INDEPENDENT_AMBULATORY_CARE_PROVIDER_SITE_OTHER): Payer: 59 | Admitting: Family Medicine

## 2019-12-02 DIAGNOSIS — R1010 Upper abdominal pain, unspecified: Secondary | ICD-10-CM

## 2019-12-02 DIAGNOSIS — R109 Unspecified abdominal pain: Secondary | ICD-10-CM | POA: Insufficient documentation

## 2019-12-02 NOTE — Progress Notes (Signed)
Patient was scheduled for ov for abdominal pain. She requested to convert to video earlier today but was not able to connect.   She was asked to rescheduled for tomorrow at 1:30.

## 2019-12-03 ENCOUNTER — Ambulatory Visit: Payer: 59 | Admitting: Nurse Practitioner

## 2019-12-16 NOTE — Progress Notes (Signed)
Virtual Visit via Video Note  I connected with Brooke [name] on 12/22/19 at 10:40 AM EST by a video enabled telemedicine application and verified that I am speaking with the correct person using two identifiers.  Location: Patient: home Provider: office Persons participated in the visit- patient, provider   I discussed the limitations of evaluation and management by telemedicine and the availability of in person appointments. The patient expressed understanding and agreed to proceed.     I discussed the assessment and treatment plan with the patient. The patient was provided an opportunity to ask questions and all were answered. The patient agreed with the plan and demonstrated an understanding of the instructions.   The patient was advised to call back or seek an in-person evaluation if the symptoms worsen or if the condition fails to improve as anticipated.  I provided 15 minutes of non-face-to-face time during this encounter.   [name], MD    [facility] MD/PA/NP OP Progress Note  12/22/2019 11:04 AM Brooke [name]  MRN:  [id number]  Chief Complaint:  Chief Complaint    Follow-up; Depression     HPI:  This is a follow-up appointment for depression.  She states that she continues to feel ill and has bad attitude towards other people, especially at her husband. She occasionally throw something when she feels frustrated, although she denies trying to hit other people or any aggressive behaviors.  She had a good time with her family and her mother.  She has been able to be attentive to her children.  Although she has not noticed any difference since up titration of Abilify, she notices some hand tremors.  She has insomnia.  She feels drowsy in the morning, and wonders if venlafaxine is attributable to it.  She has fair energy and motivation.  She lost 11 pound since she is on Wegovi.  She reports occasional fleeting thoughts of driving to the end of the ocean, although she denies  any intent.  She denies decreased need for sleep or euphoria.  She feels anxious and tense.   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 The patient has a custody), grandson , age 6 stays with her during the day Marital status:married Number of children:2(age26, 28) She describes her childhood as "rough," and "poor." Her father was murdered, and she never met him. Her step father was strict. She reports good relationship with her mother. She stayed with her grandmother in summer time. She has started to work at [age] year old.  Education: some college  Visit Diagnosis:    ICD-10-CM   1. MDD (major depressive disorder), recurrent episode, mild ([place])  F33.0   2. Panic attacks  F41.0     Past Psychiatric History: Please see initial evaluation for full details. I have reviewed the history. No updates at this time.     Past Medical History:  Past Medical History:  Diagnosis Date  . Acid reflux   . Amenorrhea 02/06/2012  . Anxiety   . Arthritis    [name] 10/13/2019  . B12 deficiency   . Breast lump 08/06/2019  . Cervical radiculitis   . Chest pain   . Chronic constipation   . DDD (degenerative disc disease), lumbar   . Depression   . Depression    [name]  10/13/2019  . Dizzy spells   . Elevated vitamin B12 level 05/04/2019  . Esophageal dysphagia 11/19/2012  . Facial numbness   . Fatty liver   . GERD (gastroesophageal reflux disease)    [name] 10/13/2019  . Headache(784.0) 04/01/2012  . Heart palpitations 01/2017  . High cholesterol   . History of anemia   . History of hiatal hernia   . Hypertension   . Insomnia   . Intractable migraine with visual aura and without status migrainosus 01/23/2017  . Iron deficiency anemia   . Irregular periods 08/06/2019   . Joint pain   . Lactose intolerance   . LUQ pain 11/19/2012  . Menopausal symptom 08/06/2019  . Migraines    occ  . Near syncope 01/2017  . Numbness and tingling 10/08/2016   Formatting of this note might be different from the original. ---Oct 2018-TEE----Normal left ventricular size and systolic function with no appreciable segmental abnormality. EF 60% There was no evidence of spontaneous echo contrast or thrombus in the left atrium or left atrial appendage. No significant valvular abnormalites noted Bubble study performed, this is negative.  . Numbness and tingling in left arm   . Obesity   . Other malaise and fatigue 05/19/2012  . Panic attacks   . Sciatica   . Seizures ([place])   . Slurred speech 11/07/2016   Formatting of this note might be different from the original. ---Oct 2018-TEE----Normal left ventricular size and systolic function with no appreciable segmental abnormality. EF 60% There was no evidence of spontaneous echo contrast or thrombus in the left atrium or left atrial appendage. No significant valvular abnormalites noted Bubble study performed, this is negative.  . Small bowel obstruction ([place]) 07/20/2017  . Spells of speech arrest 01/23/2017  . Transient cerebral ischemia 10/08/2016   Formatting of this note might be different from the original. ---Oct 2018-TEE----Normal left ventricular size and systolic function with no appreciable segmental abnormality. EF 60% There was no evidence of spontaneous echo contrast or thrombus in the left atrium or left atrial appendage. No significant valvular abnormalites noted Bubble study performed, this is negative.  . Vitamin D deficiency   . Word finding difficulty 01/23/2017    Past Surgical History:  Procedure Laterality Date  . BUNIONECTOMY Left yrs ago  . CERVICAL ABLATION  2017  . COLONOSCOPY, ESOPHAGOGASTRODUODENOSCOPY (EGD) AND ESOPHAGEAL DILATION N/A 12/03/2012   QKM:MNOTRRNH melanosis throughout the entire examined colon/The  colon IS redundant/Small internal hemorrhoids/EGD:Esophageal web/Medium sized hiatal hernia/MILD Non-erosive gastritis  . ESOPHAGOGASTRODUODENOSCOPY  03/09/09   Dr. Wilford Corner, normal EGD, s/p Bravo capsule placement  . ESOPHAGOGASTRODUODENOSCOPY N/A 06/24/2019   rourk: Status post gastric bypass procedure, normal esophagus status post dilation  . EYE SURGERY N/A    [name] 10/13/2019  . GASTRIC ROUX-EN-Y N/A 07/16/2017   Procedure: LAPAROSCOPIC ROUX-EN-Y GASTRIC BYPASS WITH UPPER ENDOSCOPY AND ERAS PATHWAY;  Surgeon: Johnathan Hausen, MD;  Location: [facility];  Service: General;  Laterality: N/A;  . LAPAROSCOPY N/A 07/20/2017   Procedure: LAPAROSCOPY DIAGNOSTIC. REDUCTION OF SMALL BOWEL OBSTRUCTION. REPAIR OF TROCAR HERNIA.;  Surgeon: Alphonsa Overall, MD;  Location: [facility];  Service: General;  Laterality: N/A;  Venia Minks DILATION N/A 06/24/2019   Procedure: Keturah Shavers;  Surgeon: Daneil Dolin, MD;  Location: [facility];  Service: Endoscopy;  Laterality: N/A;  . TUBAL LIGATION    . WISDOM TOOTH EXTRACTION      Family Psychiatric History: Please see initial evaluation for full details. I have reviewed the history. No  updates at this time.     Family History:  Family History  Problem Relation Age of Onset  . Diabetes Mother   . Hypertension Mother   . Drug abuse Mother   . Anxiety disorder Mother   . Depression Mother   . CAD Mother        CABG in 49s  . Lung cancer Mother   . Hyperlipidemia Mother   . Heart disease Mother   . Cancer Mother   . Obesity Mother   . Hypertension Father   . Sudden death Father   . Diabetes Sister   . Hypertension Sister   . Hypertension Brother   . Drug abuse Brother   . CAD Brother        s/p CABG in 41s  . Diabetes Paternal Grandmother   . Hypertension Brother   . Drug abuse Brother   . CAD Brother        "HEart artery blockages" in 2s  . Hypertension Brother   . Drug abuse Brother   . Anxiety disorder Maternal Grandmother   .  Depression Maternal Grandmother   . Breast cancer Maternal Grandmother        breast  . Asthma Other   . Heart disease Other   . Colon cancer Neg Hx     Social History:  Social History   Socioeconomic History  . Marital status: Married    Spouse name: Randall Hiss   . Number of children: 2  . Years of education: Not on file  . Highest education level: Some college, no degree  Occupational History  . Occupation: stay at home  Tobacco Use  . Smoking status: Former Smoker    Packs/day: 0.30    Years: 23.00    Pack years: 6.90    Types: Cigarettes    Quit date: 10/04/2012    Years since quitting: 7.2  . Smokeless tobacco: Never Used  . Tobacco comment: less than 1/2 pack cigarettes daily  Vaping Use  . Vaping Use: Never used  Substance and Sexual Activity  . Alcohol use: Yes    Comment: weekends  . Drug use: No  . Sexual activity: Yes    Partners: Male    Birth control/protection: Surgical  Other Topics Concern  . Not on file  Social History Narrative   Lives with husband, married 51 years    37 son Teron    59 son Jiles Harold -two grandchildren    Live close by    Rising cousin-custody of her daughter 26 [place]       Right handed   Pets: none      Enjoys: [employer], shopping, likes being outside       Diet: eggs, oatmeal, salad, all food groups no lot of proteins, good on veggies.    Caffeine: sweet tea-2 cups  Coffee-1 cup daily    Water: 2-3 16 oz bottles daily       Wears seat belt    Smoke and carbon monoxide detectors   Does use phone while driving but hands free   Social Determinants of Health   Financial Resource Strain: Low Risk   . Difficulty of Paying Living Expenses: Not hard at all  Food Insecurity: No Food Insecurity  . Worried About Charity fundraiser in the Last Year: Never true  . Ran Out of Food in the Last Year: Never true  Transportation Needs: No Transportation Needs  . Lack of Transportation (Medical): No  . Lack of  Transportation (Non-Medical):  No  Physical Activity: Inactive  . Days of Exercise per Week: 0 days  . Minutes of Exercise per Session: 0 min  Stress: Stress Concern Present  . Feeling of Stress : To some extent  Social Connections: Moderately Integrated  . Frequency of Communication with Friends and Family: More than three times a week  . Frequency of Social Gatherings with Friends and Family: More than three times a week  . Attends Religious Services: More than 4 times per year  . Active Member of Clubs or Organizations: No  . Attends Archivist Meetings: Never  . Marital Status: Married    Allergies: No Known Allergies  Metabolic Disorder Labs: Lab Results  Component Value Date   HGBA1C 5.2 09/14/2019   MPG 105 08/05/2019   No results found for: PROLACTIN Lab Results  Component Value Date   CHOL 184 09/14/2019   TRIG 84 09/14/2019   HDL 60 09/14/2019   CHOLHDL 3.4 08/05/2019   VLDL 13 05/13/2012   LDLCALC 109 (H) 09/14/2019   LDLCALC 127 (H) 08/05/2019   Lab Results  Component Value Date   TSH 1.390 09/14/2019   TSH 1.48 02/17/2019    Therapeutic Level Labs: No results found for: LITHIUM No results found for: VALPROATE No components found for:  CBMZ  Current Medications: Current Outpatient Medications  Medication Sig Dispense Refill  . acetaminophen (TYLENOL) 500 MG tablet Take 1,000 mg by mouth daily as needed for moderate pain or headache.    . Alum & Mag Hydroxide-Simeth (MYLANTA PO) Take by mouth as needed.    . Biotin 10000 MCG TABS Take 5,000 mcg by mouth every other day.     . busPIRone (BUSPAR) 5 MG tablet Take 1 tablet (5 mg total) by mouth 3 (three) times daily. 90 tablet 1  . calcium carbonate (OSCAL) 1500 (600 Ca) MG TABS tablet Take 600 mg by mouth daily.    . diclofenac Sodium (VOLTAREN) 1 % GEL Apply 1 g topically 3 (three) times daily as needed for pain.    . fluticasone (FLONASE) 50 MCG/ACT nasal spray Place 2 sprays into both nostrils daily. 16 g 1  .  hydrOXYzine (ATARAX/VISTARIL) 25 MG tablet Take 1 tablet (25 mg total) by mouth daily as needed for anxiety. 90 tablet 0  . levETIRAcetam (KEPPRA) 250 MG tablet Take 250 mg by mouth at bedtime.    Marland Kitchen levocetirizine (XYZAL) 5 MG tablet Take 1 tablet (5 mg total) by mouth every evening. 30 tablet 2  . lubiprostone (AMITIZA) 24 MCG capsule Take 1 capsule (24 mcg total) by mouth 2 (two) times daily with a meal. 180 capsule 3  . Multiple Vitamins-Minerals (BARIATRIC MULTIVITAMINS/IRON PO) Take 1 tablet by mouth daily.     . ondansetron (ZOFRAN) 4 MG tablet Take 1 tablet (4 mg total) by mouth every 8 (eight) hours as needed for nausea or vomiting. 12 tablet 0  . pantoprazole (PROTONIX) 40 MG tablet Take 1 tablet (40 mg total) by mouth 2 (two) times daily before a meal. 180 tablet 3  . promethazine (PHENERGAN) 12.5 MG tablet Take 1 tablet (12.5 mg total) by mouth every 8 (eight) hours as needed for up to 7 days for nausea or vomiting. 21 tablet 0  . Semaglutide-Weight Management (WEGOVY) 1 MG/0.5ML SOAJ Inject 1 mg into the skin once a week. 2 mL 0  . topiramate (TOPAMAX) 100 MG tablet Take 100 mg by mouth 2 (two) times daily.     . [  START ON 12/28/2019] venlafaxine XR (EFFEXOR-XR) 150 MG 24 hr capsule Total of 225 mg along with 75 mg tab 90 capsule 0  . venlafaxine XR (EFFEXOR-XR) 75 MG 24 hr capsule 225 mg daily. Take along with 150 mg cap 90 capsule 0  . Vitamin D, Ergocalciferol, (DRISDOL) 1.25 MG (50000 UNIT) CAPS capsule Take 1 capsule (50,000 Units total) by mouth every 7 (seven) days. 12 capsule 1   No current facility-administered medications for this visit.     Musculoskeletal: Strength & Muscle Tone: N?A Gait & Station: N/A Patient leans: N/A  Psychiatric Specialty Exam: Review of Systems  Psychiatric/Behavioral: Positive for dysphoric mood and sleep disturbance. Negative for agitation, behavioral problems, confusion, decreased concentration, hallucinations, self-injury and suicidal  ideas. The patient is nervous/anxious. The patient is not hyperactive.   All other systems reviewed and are negative.   Last menstrual period 12/20/2019.There is no height or weight on file to calculate BMI.  General Appearance: Fairly Groomed  Eye Contact:  Good  Speech:  Clear and Coherent  Volume:  Normal  Mood:  "ill"  Affect:  Appropriate, Congruent and euthymic  Thought Process:  Coherent  Orientation:  Full (Time, Place, and Person)  Thought Content: Logical   Suicidal Thoughts:  Yes.  without intent/plan  Homicidal Thoughts:  No  Memory:  Immediate;   Good  Judgement:  Good  Insight:  Fair  Psychomotor Activity:  Normal  Concentration:  Concentration: Good and Attention Span: Good  Recall:  Good  Fund of Knowledge: Good  Language: Good  Akathisia:  No  Handed:  Right  AIMS (if indicated): not done  Assets:  Communication Skills Desire for Improvement  ADL's:  Intact  Cognition: WNL  Sleep:  Poor   Screenings: GAD-7   Flowsheet Row Video Visit from 11/25/2019 in [facility] Office Visit from 10/22/2019 in [facility] Video Visit from 09/03/2019 in [facility] Video Visit from 08/06/2019 in [facility] Office Visit from 05/04/2019 in [facility]  Total GAD-7 Score _0 PHQ2-9   Flowsheet Row Video Visit from 11/25/2019 in [facility] Video Visit from 11/16/2019 in [facility] Office Visit from 10/22/2019 in [facility] Video Visit from 10/14/2019 in [facility] Video Visit from 09/24/2019 in [facility]  PHQ-2 Total Score 2 0 2 2 0  PHQ-9 Total Score _1 --       Assessment and Plan:  Brooke [name] is a 49 y.o. year old female with a history of depression,spells of unresponsiveness, followed by neurology, migraine, hypertension, GERD, s/pRYGB 07/2017, mild obstructive sleep apnea, who presents for follow up appointment for below.    1. MDD (major depressive disorder), recurrent episode, mild ([place]) 2. Panic attacks She continues to report irritability, anxiety and depressive symptoms since the last visit. Psychosocial stressors includes occasional marital conflict, being a caregiver of her cousin, loss of her husband's uncle,unemployment,demoralization due to pain/seizure like episodes,and her mother being diagnosed with lung cancer.   She has had limited benefit from up titration of Abilify, and she has noticed hand tremors.  Will discontinue Abilify to avoid potential risk of EPS.  Will start BuSpar to target anxiety.  Will continue venlafaxine to target depression and anxiety.  Noted that she reports some drowsiness in the morning, which she Patri attributes to venlafaxine.  She is advised to take this medication at night to see if it mitigates  potential side effect.   3. Uncomplicated alcohol dependence ([place]) She has been able to cut down alcohol use.  We will continue motivational interview.   Plan 1.Continuevenlafaxine239m at night - monitor drowsiness 2. Discontinue Abilify  3. Start Buspar 5 mg three times day 4.Discontinue hydroxyzine due to concern of drowsiness 5. Next appointment:11/25 at 11:30 for 30 mins, video - hold clonazepam - She had PSG in 2019;IMPRESSION: 1. Mild Obstructive Sleep Apnea at AHI 4.2 /h - not enough to need intervention (OSA),2. Moderate Severe Periodic Limb Movement Disorder (PLMD),3. Normal REM latency.  Past trials of medication:sertraline, fluoxetine, Effexor (sick),bupropion, Abilify (tremors)  I have reviewed suicide assessment in detail. No change in the following assessment.   The patient demonstrates the following risk factors for suicide: Chronic risk factors for suicide include:psychiatric disorder ofdepression, OCDand chronic pain. Acute risk factorsfor suicide include: unemployment. Protective factorsfor this patient include: positive social  support, responsibility to others (children, family), coping skills and hope for the future.Although she has guns at home, it is in a safe and she does not have access to keys.Considering these factors, the overall suicide risk at this point appears to below. Patientisappropriate for outpatient follow up.   RNorman Clay MD 12/22/2019, 11:04 AM

## 2019-12-21 ENCOUNTER — Ambulatory Visit (INDEPENDENT_AMBULATORY_CARE_PROVIDER_SITE_OTHER): Payer: 59 | Admitting: Family Medicine

## 2019-12-21 ENCOUNTER — Encounter (INDEPENDENT_AMBULATORY_CARE_PROVIDER_SITE_OTHER): Payer: Self-pay | Admitting: Family Medicine

## 2019-12-21 ENCOUNTER — Other Ambulatory Visit: Payer: Self-pay

## 2019-12-21 VITALS — BP 116/77 | HR 67 | Temp 98.1°F | Ht 62.0 in | Wt 201.0 lb

## 2019-12-21 DIAGNOSIS — Z6836 Body mass index (BMI) 36.0-36.9, adult: Secondary | ICD-10-CM | POA: Diagnosis not present

## 2019-12-21 DIAGNOSIS — Z9189 Other specified personal risk factors, not elsewhere classified: Secondary | ICD-10-CM

## 2019-12-21 DIAGNOSIS — E8881 Metabolic syndrome: Secondary | ICD-10-CM

## 2019-12-21 DIAGNOSIS — E559 Vitamin D deficiency, unspecified: Secondary | ICD-10-CM | POA: Diagnosis not present

## 2019-12-21 MED ORDER — WEGOVY 1 MG/0.5ML ~~LOC~~ SOAJ: 1.0000 mg | SUBCUTANEOUS | 0 refills | Status: DC

## 2019-12-22 ENCOUNTER — Encounter: Payer: Self-pay | Admitting: Psychiatry

## 2019-12-22 ENCOUNTER — Telehealth (INDEPENDENT_AMBULATORY_CARE_PROVIDER_SITE_OTHER): Payer: 59 | Admitting: Psychiatry

## 2019-12-22 DIAGNOSIS — F410 Panic disorder [episodic paroxysmal anxiety] without agoraphobia: Secondary | ICD-10-CM | POA: Diagnosis not present

## 2019-12-22 DIAGNOSIS — F330 Major depressive disorder, recurrent, mild: Secondary | ICD-10-CM | POA: Diagnosis not present

## 2019-12-22 MED ORDER — BUSPIRONE HCL 5 MG PO TABS: 5.0000 mg | ORAL_TABLET | Freq: Three times a day (TID) | ORAL | 1 refills | Status: DC

## 2019-12-22 MED ORDER — VENLAFAXINE HCL ER 150 MG PO CP24: ORAL_CAPSULE | ORAL | 0 refills | Status: DC

## 2019-12-22 NOTE — Progress Notes (Signed)
Chief Complaint:   OBESITY Brooke [name] is here to discuss her progress with her obesity treatment plan along with follow-up of her obesity related diagnoses. Brooke [name] is on the [employer] and states she is following her eating plan approximately 75% of the time. Brooke [name] states she is walking for 1.5 miles 3 times per week.   Today's visit was #: 5 Starting weight: 217 lbs Starting date: 09/14/2019 Today's weight: 201 lbs Today's date: 12/21/2019 Total lbs lost to date: 16 Total lbs lost since last in-office visit: 11  Interim History: Brooke [name] is following the Pescatarian plan for 75% of the time. She is alternating between eating vegetables and occasionally eating fish. She doesn't think she is getting all of her protein in daily. She has been doing some milk in addition to cereal at breakfast. She is noticing increase in snacking. She has no plans for travel for the next few weeks.  Subjective:   1. Vitamin D deficiency Brooke [name] denies nausea, vomiting, or muscle weakness, but she notes fatigue. She is on prescription Vit D.   2. Insulin resistance Brooke [name]'s last A1c was 5.2 and insulin 5.9, she is on GLP-1.  3. At risk for side effect of medication Brooke [name] is at risk for drug side effects due to increased dose of Wegovy to 1 mg.  Assessment/Plan:   1. Vitamin D deficiency Low Vitamin D level contributes to fatigue and are associated with obesity, breast, and colon cancer. Brooke [name] agreed to continue taking prescription Vitamin D 50,000 IU every week, no refill needed. She will follow-up for routine testing of Vitamin D, at least 2-3 times per year to avoid over-replacement.  2. Insulin resistance Brooke [name] will continue to work on weight loss, exercise, and decreasing simple carbohydrates to help decrease the risk of diabetes. Brooke [name] will continue GLP-1, and we will repeat labs in 2 months. She agreed to follow-up with [place] as directed to closely monitor her progress.  3. At risk for  side effect of medication Brooke [name] was given approximately 15 minutes of drug side effect counseling today.  We discussed side effect possibility and risk versus benefits. Brooke [name] agreed to the medication and will contact this office if these side effects are intolerable.  Repetitive spaced learning was employed today to elicit superior memory formation and behavioral change.  4. Class 2 severe obesity with serious comorbidity and body mass index (BMI) of 36.0 to 36.9 in adult, unspecified obesity type (HCC) Brooke [name] is currently in the action stage of change. As such, her goal is to continue with weight loss efforts. She has agreed to the [employer].   We discussed various medication options to help Brooke [name] with her weight loss efforts and we both agreed to increase Wegovy to 1 mg SubQ weekly with no refills.  Exercise goals: As is.  Behavioral modification strategies: increasing lean protein intake, meal planning and cooking strategies, keeping healthy foods in the home, holiday eating strategies  and planning for success.  Brooke [name] has agreed to follow-up with our clinic in 3 weeks. She was informed of the importance of frequent follow-up visits to maximize her success with intensive lifestyle modifications for her multiple health conditions.   Objective:   Blood pressure 116/77, pulse 67, temperature 98.1 F (36.7 C), temperature source Oral, height 5\' 2"  (1.575 m), weight 201 lb (91.2 kg), last menstrual period 12/20/2019, SpO2 97 %. Body mass index is 36.76 kg/m.  General: Cooperative, alert, well developed, in no acute distress. HEENT: Conjunctivae and lids unremarkable.  Cardiovascular: Regular rhythm.  Lungs: Normal work of breathing. Neurologic: No focal deficits.   Lab Results  Component Value Date   CREATININE 0.93 10/22/2019   BUN 9 10/22/2019   NA 141 10/22/2019   K 4.7 10/22/2019   CL 109 (H) 10/22/2019   CO2 21 10/22/2019   Lab Results  Component Value Date   ALT  34 (H) 10/22/2019   AST 24 10/22/2019   ALKPHOS 96 10/22/2019   BILITOT 0.3 10/22/2019   Lab Results  Component Value Date   HGBA1C 5.2 09/14/2019   HGBA1C 5.3 08/05/2019   Lab Results  Component Value Date   INSULIN 5.9 09/14/2019   Lab Results  Component Value Date   TSH 1.390 09/14/2019   Lab Results  Component Value Date   CHOL 184 09/14/2019   HDL 60 09/14/2019   LDLCALC 109 (H) 09/14/2019   TRIG 84 09/14/2019   CHOLHDL 3.4 08/05/2019   Lab Results  Component Value Date   WBC 5.0 10/22/2019   HGB 13.3 10/22/2019   HCT 40.0 10/22/2019   MCV 92 10/22/2019   PLT 349 10/22/2019   Lab Results  Component Value Date   IRON 111 09/03/2019   TIBC 292 09/03/2019   FERRITIN 187 09/03/2019   Attestation Statements:   Reviewed by clinician on day of visit: allergies, medications, problem list, medical history, surgical history, family history, social history, and previous encounter notes.   I, Trixie Dredge, am acting as transcriptionist for Coralie Common, MD.  I have reviewed the above documentation for accuracy and completeness, and I agree with the above. - Jinny Blossom, MD

## 2019-12-22 NOTE — Patient Instructions (Signed)
1.Continuevenlafaxine225mg  at night  2. Discontinue Abilify  3. Start buspar 5 mg three times day 4.Discontinue hydroxyzine due to concern of drowsiness 5. Next appointment:11/25 at 11:30

## 2019-12-23 ENCOUNTER — Telehealth (INDEPENDENT_AMBULATORY_CARE_PROVIDER_SITE_OTHER): Payer: Self-pay

## 2019-12-23 NOTE — Telephone Encounter (Signed)
Received fax from [employer], which is the pt's pharmacy benefits manager, stating that the pt has been denied coverage for [employer] 1mg /0.73mL once weekly injection.   Denial reason is because the medication is listed as "excluded" from the plan's formaulary. The plan does not allow a letter of medical necessity for excluded drugs. It also indicates to check formulary for covered alternatives.

## 2019-12-29 ENCOUNTER — Other Ambulatory Visit: Payer: 59

## 2019-12-29 ENCOUNTER — Other Ambulatory Visit: Payer: Self-pay

## 2019-12-29 ENCOUNTER — Encounter: Payer: Self-pay | Admitting: Family Medicine

## 2019-12-29 DIAGNOSIS — Z20822 Contact with and (suspected) exposure to covid-19: Secondary | ICD-10-CM

## 2019-12-31 LAB — SARS-COV-2, NAA 2 DAY TAT

## 2019-12-31 LAB — NOVEL CORONAVIRUS, NAA: SARS-CoV-2, NAA: NOT DETECTED

## 2020-01-06 ENCOUNTER — Telehealth: Payer: Self-pay

## 2020-01-06 NOTE — Telephone Encounter (Signed)
Medication problem - Telephone call with patient to follow up on message she left she was having problems with new Buspirone medication.  Pt. reported she was taking it three times a day but then cut back due to nausea and dizziness. Patient she then tried it a two a day and then one a day but no matter how little of it she takes it still makes her feel nauseated when she takes it and dizzy.  Agreed to send message to Dr. Vanetta Shawl with this information and her request to possibly try something else that may be similar.

## 2020-01-07 ENCOUNTER — Other Ambulatory Visit: Payer: Self-pay | Admitting: Psychiatry

## 2020-01-07 MED ORDER — MIRTAZAPINE 15 MG PO TABS: 15.0000 mg | ORAL_TABLET | Freq: Every day | ORAL | 0 refills | Status: DC

## 2020-01-07 NOTE — Telephone Encounter (Signed)
Ordered mirtazapine. 7.5 mg at night for one week, then 15 mg at night. (direction is on the bottle)

## 2020-01-07 NOTE — Telephone Encounter (Signed)
Advise her to discontinue buspirone. We can add mirtazapine at night if she is interested. Side effects include  drowsiness, increase in appetite. Noted that any other treatment option/medication does have potential side effect of weight gain. I would also advise her to see a therapist again.

## 2020-01-07 NOTE — Telephone Encounter (Signed)
medication management - Telephone call with patient to inform of prescription sent and instructions, one week at 7.5 mg (1/2 tablet) and to then go up to 15 mg full tablet.  Pt. stated understanding and will call back if any problems or concerns.

## 2020-01-07 NOTE — Telephone Encounter (Signed)
Medication management - Telephone call with pt to discuss mirtazapine option and potential side effects to the medication, in particular the likelihood to cause drowsiness and possible increased appetite.  Patient stated she still would like to try it. Patient verbalized understanding and to discontinue Buspirone.  Patient agreed would call back if any problems with new medication and verified her pharmacy is the [facility] on [address] In [place].  Patient to also keep her appt. 01/26/20.

## 2020-01-12 ENCOUNTER — Ambulatory Visit (INDEPENDENT_AMBULATORY_CARE_PROVIDER_SITE_OTHER): Payer: 59 | Admitting: Family Medicine

## 2020-01-12 ENCOUNTER — Other Ambulatory Visit: Payer: Self-pay

## 2020-01-12 ENCOUNTER — Encounter (INDEPENDENT_AMBULATORY_CARE_PROVIDER_SITE_OTHER): Payer: Self-pay | Admitting: Family Medicine

## 2020-01-12 VITALS — BP 100/70 | HR 88 | Temp 98.0°F | Ht 62.0 in | Wt 200.0 lb

## 2020-01-12 DIAGNOSIS — F419 Anxiety disorder, unspecified: Secondary | ICD-10-CM | POA: Diagnosis not present

## 2020-01-12 DIAGNOSIS — E559 Vitamin D deficiency, unspecified: Secondary | ICD-10-CM

## 2020-01-12 DIAGNOSIS — Z6836 Body mass index (BMI) 36.0-36.9, adult: Secondary | ICD-10-CM

## 2020-01-12 DIAGNOSIS — Z9189 Other specified personal risk factors, not elsewhere classified: Secondary | ICD-10-CM

## 2020-01-12 MED ORDER — VITAMIN D (ERGOCALCIFEROL) 1.25 MG (50000 UNIT) PO CAPS: 50000.0000 [IU] | ORAL_CAPSULE | ORAL | 1 refills | Status: DC

## 2020-01-14 ENCOUNTER — Institutional Professional Consult (permissible substitution): Payer: 59 | Admitting: Neurology

## 2020-01-18 NOTE — Progress Notes (Signed)
Chief Complaint:   OBESITY Brooke [name] is here to discuss her progress with her obesity treatment plan along with follow-up of her obesity related diagnoses. Brooke [name] is on the [employer] and states she is following her eating plan approximately 75% of the time. Brooke [name] states she is walking 30 minutes 3-4 times per week.  Today's visit was #: 6 Starting weight: 217 lbs Starting date: 09/14/2019 Today's weight: 200 lbs Today's date: 01/12/2020 Total lbs lost to date: 17 lbs Total lbs lost since last in-office visit: 1 lb  Interim History: Brooke [name] had a good holiday which included a good amount of indulgent eating. She can't eat a significant amount, but is experiencing hunger 2 hours after eating. Brooke [name] has a snack at that time. She is alternating between vegetable and salad at lunch, and getting salad or microwavable dinners for dinner. Insurance is no longer paying for wegovy. Brooke [name] is planning a trip to [place] February 3-6.   Subjective:   1. Vitamin D deficiency Brooke [name] denies nausea, vomiting,or muscle weakness, but notes fatigue. She is on prescription Vitamin D.  2. Anxiety Brooke [name] could not take buspar due to it causing her to fee ill. She stopped hydroxyzine. She was prescribed mirtazapine 4 days ago.  3. At risk for osteoporosis Brooke [name] is at higher risk of osteopenia and osteoporosis due to Vitamin D deficiency.     Assessment/Plan:   1. Vitamin D deficiency We will refill Vitamin D 50,00 Unit/wk #4. No refill.  - Vitamin D, Ergocalciferol, (DRISDOL) 1.25 MG (50000 UNIT) CAPS capsule; Take 1 capsule (50,000 Units total) by mouth every 7 (seven) days.  Dispense: 12 capsule; Refill: 1  2. Anxiety Brooke [name] will follow up on weight at next appointment.  3. At risk for osteoporosis Brooke [name] was given approximately 15 minutes of osteoporosis prevention counseling today. Brooke [name] is at risk for osteopenia and osteoporosis due to her Vitamin D deficiency. She was encouraged  to take her Vitamin D and follow her higher calcium diet and increase strengthening exercise to help strengthen her bones and decrease her risk of osteopenia and osteoporosis.  Repetitive spaced learning was employed today to elicit superior memory formation and behavioral change.  4. Class 2 severe obesity with serious comorbidity and body mass index (BMI) of 36.0 to 36.9 in adult, unspecified obesity type (HCC)  Brooke [name] is currently in the action stage of change. As such, her goal is to continue with weight loss efforts. She has agreed to the [employer].   Exercise goals: Brooke [name] will start 10-15 resistance training 3 times week.  Behavioral modification strategies: increasing lean protein intake, meal planning and cooking strategies, keeping healthy foods in the home and planning for success.  Brooke [name] has agreed to follow-up with our clinic in 2-3 weeks. She was informed of the importance of frequent follow-up visits to maximize her success with intensive lifestyle modifications for her multiple health conditions.    Objective:   Blood pressure 100/70, pulse 88, temperature 98 F (36.7 C), temperature source Oral, height 5\' 2"  (1.575 m), weight 200 lb (90.7 kg), last menstrual period 12/20/2019, SpO2 99 %. Body mass index is 36.58 kg/m.  General: Cooperative, alert, well developed, in no acute distress. HEENT: Conjunctivae and lids unremarkable. Cardiovascular: Regular rhythm.  Lungs: Normal work of breathing. Neurologic: No focal deficits.   Lab Results  Component Value Date   CREATININE 0.93 10/22/2019   BUN 9 10/22/2019   NA 141 10/22/2019   K 4.7 10/22/2019   CL 109 (  H) 10/22/2019   CO2 21 10/22/2019   Lab Results  Component Value Date   ALT 34 (H) 10/22/2019   AST 24 10/22/2019   ALKPHOS 96 10/22/2019   BILITOT 0.3 10/22/2019   Lab Results  Component Value Date   HGBA1C 5.2 09/14/2019   HGBA1C 5.3 08/05/2019   Lab Results  Component Value Date   INSULIN  5.9 09/14/2019   Lab Results  Component Value Date   TSH 1.390 09/14/2019   Lab Results  Component Value Date   CHOL 184 09/14/2019   HDL 60 09/14/2019   LDLCALC 109 (H) 09/14/2019   TRIG 84 09/14/2019   CHOLHDL 3.4 08/05/2019   Lab Results  Component Value Date   WBC 5.0 10/22/2019   HGB 13.3 10/22/2019   HCT 40.0 10/22/2019   MCV 92 10/22/2019   PLT 349 10/22/2019   Lab Results  Component Value Date   IRON 111 09/03/2019   TIBC 292 09/03/2019   FERRITIN 187 09/03/2019      Attestation Statements:   Reviewed by clinician on day of visit: allergies, medications, problem list, medical history, surgical history, family history, social history, and previous encounter notes.   I, Para March, am acting as Location manager for Coralie Common, MD.  I have reviewed the above documentation for accuracy and completeness, and I agree with the above. - Jinny Blossom, MD
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
## 2020-01-26 ENCOUNTER — Telehealth (INDEPENDENT_AMBULATORY_CARE_PROVIDER_SITE_OTHER): Payer: 59 | Admitting: Psychiatry

## 2020-01-26 ENCOUNTER — Encounter: Payer: Self-pay | Admitting: Psychiatry

## 2020-01-26 ENCOUNTER — Encounter (INDEPENDENT_AMBULATORY_CARE_PROVIDER_SITE_OTHER): Payer: Self-pay

## 2020-01-26 ENCOUNTER — Other Ambulatory Visit: Payer: Self-pay

## 2020-01-26 ENCOUNTER — Other Ambulatory Visit (INDEPENDENT_AMBULATORY_CARE_PROVIDER_SITE_OTHER): Payer: Self-pay | Admitting: Family Medicine

## 2020-01-26 DIAGNOSIS — Z6836 Body mass index (BMI) 36.0-36.9, adult: Secondary | ICD-10-CM

## 2020-01-26 DIAGNOSIS — F410 Panic disorder [episodic paroxysmal anxiety] without agoraphobia: Secondary | ICD-10-CM

## 2020-01-26 DIAGNOSIS — F330 Major depressive disorder, recurrent, mild: Secondary | ICD-10-CM

## 2020-01-26 MED ORDER — QUETIAPINE FUMARATE 25 MG PO TABS: 25.0000 mg | ORAL_TABLET | Freq: Every day | ORAL | 1 refills | Status: DC

## 2020-01-26 MED ORDER — VENLAFAXINE HCL ER 75 MG PO CP24: ORAL_CAPSULE | ORAL | 0 refills | Status: DC

## 2020-01-28 ENCOUNTER — Encounter: Payer: Self-pay | Admitting: Nurse Practitioner

## 2020-01-28 ENCOUNTER — Other Ambulatory Visit: Payer: Self-pay

## 2020-01-28 ENCOUNTER — Ambulatory Visit (INDEPENDENT_AMBULATORY_CARE_PROVIDER_SITE_OTHER): Payer: Self-pay | Admitting: Nurse Practitioner

## 2020-01-28 ENCOUNTER — Telehealth: Payer: Self-pay | Admitting: *Deleted

## 2020-01-28 VITALS — BP 125/82 | HR 81 | Temp 97.8°F | Ht 62.0 in | Wt 205.0 lb

## 2020-01-28 DIAGNOSIS — R1319 Other dysphagia: Secondary | ICD-10-CM

## 2020-01-28 DIAGNOSIS — K219 Gastro-esophageal reflux disease without esophagitis: Secondary | ICD-10-CM

## 2020-01-28 DIAGNOSIS — R140 Abdominal distension (gaseous): Secondary | ICD-10-CM | POA: Insufficient documentation

## 2020-01-28 DIAGNOSIS — R110 Nausea: Secondary | ICD-10-CM | POA: Insufficient documentation

## 2020-01-28 DIAGNOSIS — K5909 Other constipation: Secondary | ICD-10-CM

## 2020-01-28 DIAGNOSIS — R1310 Dysphagia, unspecified: Secondary | ICD-10-CM | POA: Insufficient documentation

## 2020-01-28 MED ORDER — TRULANCE 3 MG PO TABS: 1.0000 | ORAL_TABLET | Freq: Every day | ORAL | 2 refills | Status: DC

## 2020-01-28 NOTE — Patient Instructions (Signed)
Your health issues we discussed today were:   GERD (reflux obstruction) with dysphagia (swallowing difficulties): 1. I am glad your heartburn/reflux is doing better 2. Because of your occasional swallowing difficulties I will plan for swallowing study, as we discussed 3. Further recommendations will follow 4. Call us for any worsening or severe symptoms  Constipation: 1. As we discussed, start taking Amitiza 2. I will send a prescription to your pharmacy for Trulance 3 mg once a day.  Take this with or without. 3. Call me in 1 to 2 weeks and let us know how you are doing 4. There are other medications we can try if Trulance does not work  Overall I recommend:  1. Continue other current medications 2. Return for follow-up in 2 months 3. Call us for any questions or concerns   ---------------------------------------------------------------  I am glad you have gotten your COVID-19 vaccination!  Even though you are fully vaccinated you should continue to follow CDC and state/local guidelines.  ---------------------------------------------------------------   At [facility] we value your feedback. You may receive a survey about your visit today. Please share your experience as we strive to create trusting relationships with our patients to provide genuine, compassionate, quality care.  We appreciate your understanding and patience as we review any laboratory studies, imaging, and other diagnostic tests that are ordered as we care for you. Our office policy is 5 business days for review of these results, and any emergent or urgent results are addressed in a timely manner for your best interest. If you do not hear from our office in 1 week, please contact us.   We also encourage the use of MyChart, which contains your medical information for your review as well. If you are not enrolled in this feature, an access code is on this after visit summary for your convenience. Thank  you for allowing [place] to be involved in your care.  It was great to see you today!  I hope you have a safe and your monitor!!

## 2020-01-28 NOTE — Telephone Encounter (Signed)
Called pt. She is aware of appt details for swallowing study ordered by Walden Field today at [facility].

## 2020-01-28 NOTE — Progress Notes (Signed)
Referring Provider: Perlie Mayo, NP Primary Care Physician:  Perlie Mayo, NP Primary GI:  Dr. Gala Romney  Chief Complaint  Patient presents with  . Constipation    Having to take MOM daily to have a BM  . Nausea    No vomiting  . Bloated    HPI:   Brooke [name] is a 50 y.o. female who presents for follow-up on abdominal pain.  A tentative visit 12/02/2019 via video visit but was unable to connect and subsequently rescheduled.  She was last actually seen in the office 08/24/2019 for GERD, abnormal LFTs, constipation.  EGD completed in June 2021 status post empiric dilation due to dysphagia, noted evidence of prior gastric bypass surgery.  Mild transaminitis with AST/ALT generally around 39/60.  Some recent weight gain.  Reflux improved at her last visit with occasional breakthrough, taking pantoprazole 80 mg in the morning (states her PCP wrote the prescription to be taken away).  Dysphagia resolved and continues to do well.  Constipation is persistent with a bowel movement every couple weeks despite Linzess 290 mcg daily and milk of magnesia twice a day.  Previously failed MiraLAX.  Has gained all of her weight back since gastric bypass.  Hepatitis risk factors include tattoos and previous blood transfusions in 2000.  Overall transaminitis felt likely due to fatty liver but recommended screening for viral hepatitis, autoimmune hepatitis, hemochromatosis as well as abdominal ultrasound.  Encouraged weight loss.  Switch Linzess to Amitiza 24 mcg twice daily due to no improvement on Linzess.  Recommended changing her pantoprazole to 40 mg twice daily.  She was referred to current healthy weight and wellness center.  Colonoscopy up-to-date 2014 recommended repeat in 10 years (2024).  Her labs are completed 09/03/2019 which found negative for hepatitis B and C, mildly elevated anti-smooth muscle antibody that is nonspecific in the setting of normal ANA, AMA, IgG/IgA/IgM).  Suspected fatty liver.   Recommend continue monitoring LFTs.  Diet and weight loss recommendations were provided.  Additionally, PCP recheck LFTs and some improvement but persistently elevated ALT.  Abdominal ultrasound completed 08/31/2019 which found normal gallbladder, hepatic steatosis without obvious signs of cirrhosis.  Specifically spleen is normal in size.  She called our office and of October complaining of right upper quadrant pain and burning on the right side.  Radiates to the middle of the stomach, pain typically triggered postprandial also noted not having good bowel movements with constipation and straining.  Recommended HIDA scan.  HIDA scan completed 11/09/2019 which found normal hepatic biliary study with normal gallbladder ejection fraction (61% with normal being greater than 33%).  The patient was given study results.  She indicated she did have abdominal pain when drinking the Ensure during the test. Recommended follow-up office visit.  Today she states she doing okay overall.  She is still having constipation despite Amitiza.  She is requiring milk of magnesia daily to have a bowel movement.  She has associated nausea and bloating, but no vomiting. Has a bowel movement daily if she does take MoM. She has intermittent mid-abdominal cramping, improves/resolves after a bowel movement. Denies hematochezia, melena, fever, chills, unintentional weight loss. Denies URI or flu-like symptoms. Denies loss of sense of taste or smell. The patient has received COVID-19 vaccination(s). Denies chest pain, dyspnea, dizziness, lightheadedness, syncope, near syncope. Denies any other upper or lower GI symptoms.   GERD is doing ok on Protonix, works better than any other previous PPIs. She has had an EGD with  dilation June 2021 but has started having dysphagia symptoms that started a couple months ago. Typical triggers are chicken. Does chew adequately, takes time eating, keeps fluid on hand. Food is "slow going, gets stuck a  little while but eventually moves on through."   Past Medical History:  Diagnosis Date  . Acid reflux   . Amenorrhea 02/06/2012  . Anxiety   . Arthritis    [name] 10/13/2019  . B12 deficiency   . Breast lump 08/06/2019  . Cervical radiculitis   . Chest pain   . Chronic constipation   . DDD (degenerative disc disease), lumbar   . Depression   . Depression    [name] 10/13/2019  . Dizzy spells   . Elevated vitamin B12 level 05/04/2019  . Esophageal dysphagia 11/19/2012  . Facial numbness   . Fatty liver   . GERD (gastroesophageal reflux disease)    [name] 10/13/2019  . Headache(784.0) 04/01/2012  . Heart palpitations 01/2017  . High cholesterol   . History of anemia   . History of hiatal hernia   . Hypertension   . Insomnia   . Intractable migraine with visual aura and without status migrainosus 01/23/2017  . Iron deficiency anemia   . Irregular periods 08/06/2019  . Joint pain   . Lactose intolerance   . LUQ pain 11/19/2012  . Menopausal symptom 08/06/2019  . Migraines    occ  . Near syncope 01/2017  . Numbness and tingling 10/08/2016   Formatting of this note might be different from the original. ---Oct 2018-TEE----Normal left ventricular size and systolic function with no appreciable segmental abnormality. EF 60% There was no evidence of spontaneous echo contrast or thrombus in the left atrium or left atrial appendage. No significant valvular abnormalites noted Bubble study performed, this is negative.  . Numbness and tingling in left arm   . Obesity   . Other malaise and fatigue 05/19/2012  . Panic attacks   . Sciatica   . Seizures ([place])   . Slurred speech 11/07/2016   Formatting of this note might be different from the original. ---Oct 2018-TEE----Normal left ventricular size and systolic function with no appreciable segmental abnormality. EF 60% There was no evidence of spontaneous echo contrast or thrombus in the left atrium or left atrial appendage. No significant  valvular abnormalites noted Bubble study performed, this is negative.  . Small bowel obstruction ([place]) 07/20/2017  . Spells of speech arrest 01/23/2017  . Transient cerebral ischemia 10/08/2016   Formatting of this note might be different from the original. ---Oct 2018-TEE----Normal left ventricular size and systolic function with no appreciable segmental abnormality. EF 60% There was no evidence of spontaneous echo contrast or thrombus in the left atrium or left atrial appendage. No significant valvular abnormalites noted Bubble study performed, this is negative.  . Vitamin D deficiency   . Word finding difficulty 01/23/2017    Past Surgical History:  Procedure Laterality Date  . BUNIONECTOMY Left yrs ago  . CERVICAL ABLATION  2017  . COLONOSCOPY, ESOPHAGOGASTRODUODENOSCOPY (EGD) AND ESOPHAGEAL DILATION N/A 12/03/2012   [id number] melanosis throughout the entire examined colon/The colon IS redundant/Small internal hemorrhoids/EGD:Esophageal web/Medium sized hiatal hernia/MILD Non-erosive gastritis  . ESOPHAGOGASTRODUODENOSCOPY  03/09/09   Dr. Wilford Corner, normal EGD, s/p Bravo capsule placement  . ESOPHAGOGASTRODUODENOSCOPY N/A 06/24/2019   rourk: Status post gastric bypass procedure, normal esophagus status post dilation  . EYE SURGERY N/A    [name] 10/13/2019  . GASTRIC ROUX-EN-Y N/A 07/16/2017   Procedure: LAPAROSCOPIC ROUX-EN-Y GASTRIC  BYPASS WITH UPPER ENDOSCOPY AND ERAS PATHWAY;  Surgeon: Johnathan Hausen, MD;  Location: [facility];  Service: General;  Laterality: N/A;  . LAPAROSCOPY N/A 07/20/2017   Procedure: LAPAROSCOPY DIAGNOSTIC. REDUCTION OF SMALL BOWEL OBSTRUCTION. REPAIR OF TROCAR HERNIA.;  Surgeon: Alphonsa Overall, MD;  Location: [facility];  Service: General;  Laterality: N/A;  Venia Minks DILATION N/A 06/24/2019   Procedure: Keturah Shavers;  Surgeon: Daneil Dolin, MD;  Location: [facility];  Service: Endoscopy;  Laterality: N/A;  . TUBAL LIGATION    . WISDOM TOOTH EXTRACTION       Current Outpatient Medications  Medication Sig Dispense Refill  . acetaminophen (TYLENOL) 500 MG tablet Take 1,000 mg by mouth daily as needed for moderate pain or headache.    . Biotin 10000 MCG TABS Take 5,000 mcg by mouth every other day.     . calcium carbonate (OSCAL) 1500 (600 Ca) MG TABS tablet Take 600 mg by mouth daily.    . diclofenac Sodium (VOLTAREN) 1 % GEL Apply 1 g topically 3 (three) times daily as needed for pain.    . fluticasone (FLONASE) 50 MCG/ACT nasal spray Place 2 sprays into both nostrils daily. (Patient taking differently: Place 2 sprays into both nostrils daily. As needed) 16 g 1  . levETIRAcetam (KEPPRA) 250 MG tablet Take 250 mg by mouth at bedtime.    Marland Kitchen levocetirizine (XYZAL) 5 MG tablet Take 1 tablet (5 mg total) by mouth every evening. (Patient taking differently: Take 5 mg by mouth every evening. As needed) 30 tablet 2  . lubiprostone (AMITIZA) 24 MCG capsule Take 1 capsule (24 mcg total) by mouth 2 (two) times daily with a meal. 180 capsule 3  . Multiple Vitamins-Minerals (BARIATRIC MULTIVITAMINS/IRON PO) Take 1 tablet by mouth daily.     . ondansetron (ZOFRAN) 4 MG tablet Take 1 tablet (4 mg total) by mouth every 8 (eight) hours as needed for nausea or vomiting. 12 tablet 0  . pantoprazole (PROTONIX) 40 MG tablet Take 1 tablet (40 mg total) by mouth 2 (two) times daily before a meal. 180 tablet 3  . QUEtiapine (SEROQUEL) 25 MG tablet Take 1 tablet (25 mg total) by mouth at bedtime. 30 tablet 1  . topiramate (TOPAMAX) 100 MG tablet Take 100 mg by mouth 2 (two) times daily.     Marland Kitchen venlafaxine XR (EFFEXOR-XR) 150 MG 24 hr capsule Total of 225 mg along with 75 mg tab 90 capsule 0  . [START ON 02/10/2020] venlafaxine XR (EFFEXOR-XR) 75 MG 24 hr capsule 225 mg daily. Take along with 150 mg cap 90 capsule 0  . Vitamin D, Ergocalciferol, (DRISDOL) 1.25 MG (50000 UNIT) CAPS capsule Take 1 capsule (50,000 Units total) by mouth every 7 (seven) days. 12 capsule 1  . WEGOVY  1 MG/0.5ML SOAJ INJECT ONE syringe ONCE WEEKLY 2 mL 0   No current facility-administered medications for this visit.    Allergies as of 01/28/2020  . (No Known Allergies)    Family History  Problem Relation Age of Onset  . Diabetes Mother   . Hypertension Mother   . Drug abuse Mother   . Anxiety disorder Mother   . Depression Mother   . CAD Mother        CABG in 23s  . Lung cancer Mother   . Hyperlipidemia Mother   . Heart disease Mother   . Cancer Mother   . Obesity Mother   . Hypertension Father   . Sudden death Father   .  Diabetes Sister   . Hypertension Sister   . Hypertension Brother   . Drug abuse Brother   . CAD Brother        s/p CABG in 21s  . Diabetes Paternal Grandmother   . Hypertension Brother   . Drug abuse Brother   . CAD Brother        "HEart artery blockages" in 62s  . Hypertension Brother   . Drug abuse Brother   . Anxiety disorder Maternal Grandmother   . Depression Maternal Grandmother   . Breast cancer Maternal Grandmother        breast  . Asthma Other   . Heart disease Other   . Colon cancer Neg Hx   . Gastric cancer Neg Hx   . Esophageal cancer Neg Hx     Social History   Socioeconomic History  . Marital status: Married    Spouse name: Randall Hiss   . Number of children: 2  . Years of education: Not on file  . Highest education level: Some college, no degree  Occupational History  . Occupation: stay at home  Tobacco Use  . Smoking status: Former Smoker    Packs/day: 0.30    Years: 23.00    Pack years: 6.90    Types: Cigarettes    Quit date: 10/04/2012    Years since quitting: 7.3  . Smokeless tobacco: Never Used  . Tobacco comment: less than 1/2 pack cigarettes daily  Vaping Use  . Vaping Use: Never used  Substance and Sexual Activity  . Alcohol use: Yes    Comment: weekends; hardly/social  . Drug use: No  . Sexual activity: Yes    Partners: Male    Birth control/protection: Surgical  Other Topics Concern  . Not on file   Social History Narrative   Lives with husband, married 73 years    5 son Teron    75 son Jiles Harold -two grandchildren    Live close by    Rising cousin-custody of her daughter 46 [address]       Right handed   Pets: none      Enjoys: [employer], shopping, likes being outside       Diet: eggs, oatmeal, salad, all food groups no lot of proteins, good on veggies.    Caffeine: sweet tea-2 cups  Coffee-1 cup daily    Water: 2-3 16 oz bottles daily       Wears seat belt    Smoke and carbon monoxide detectors   Does use phone while driving but hands free   Social Determinants of Health   Financial Resource Strain: Low Risk   . Difficulty of Paying Living Expenses: Not hard at all  Food Insecurity: No Food Insecurity  . Worried About Charity fundraiser in the Last Year: Never true  . Ran Out of Food in the Last Year: Never true  Transportation Needs: No Transportation Needs  . Lack of Transportation (Medical): No  . Lack of Transportation (Non-Medical): No  Physical Activity: Inactive  . Days of Exercise per Week: 0 days  . Minutes of Exercise per Session: 0 min  Stress: Stress Concern Present  . Feeling of Stress : To some extent  Social Connections: Moderately Integrated  . Frequency of Communication with Friends and Family: More than three times a week  . Frequency of Social Gatherings with Friends and Family: More than three times a week  . Attends Religious Services: More than 4 times per year  .  Active Member of Clubs or Organizations: No  . Attends Archivist Meetings: Never  . Marital Status: Married    Subjective: Review of Systems  Constitutional: Negative for chills, fever, malaise/fatigue and weight loss.  HENT: Negative for congestion and sore throat.   Respiratory: Negative for cough and shortness of breath.   Cardiovascular: Negative for chest pain and palpitations.  Gastrointestinal: Positive for constipation and heartburn (Improved). Negative for  abdominal pain, blood in stool, diarrhea, melena, nausea and vomiting.       Dysphagia  Musculoskeletal: Negative for joint pain and myalgias.  Skin: Negative for rash.  Neurological: Negative for dizziness and weakness.  Endo/Heme/Allergies: Does not bruise/bleed easily.  Psychiatric/Behavioral: Negative for depression. The patient is not nervous/anxious.   All other systems reviewed and are negative.    Objective: BP 125/82   Pulse 81   Temp 97.8 F (36.6 C)   Ht 5\' 2"  (1.575 m)   Wt 205 lb (93 kg)   BMI 37.49 kg/m  Physical Exam Vitals and nursing note reviewed.  Constitutional:      General: She is not in acute distress.    Appearance: Normal appearance. She is well-developed. She is obese. She is not ill-appearing, toxic-appearing or diaphoretic.  HENT:     Head: Normocephalic and atraumatic.     Nose: No congestion or rhinorrhea.  Eyes:     General: No scleral icterus. Cardiovascular:     Rate and Rhythm: Normal rate and regular rhythm.     Heart sounds: Normal heart sounds.  Pulmonary:     Effort: Pulmonary effort is normal. No respiratory distress.     Breath sounds: Normal breath sounds.  Abdominal:     General: Bowel sounds are normal.     Palpations: Abdomen is soft. There is no hepatomegaly, splenomegaly or mass.     Tenderness: There is no abdominal tenderness. There is no guarding or rebound.     Hernia: No hernia is present.  Skin:    General: Skin is warm and dry.     Coloration: Skin is not jaundiced.     Findings: No rash.  Neurological:     General: No focal deficit present.     Mental Status: She is alert and oriented to person, place, and time.  Psychiatric:        Attention and Perception: Attention normal.        Mood and Affect: Mood normal.        Speech: Speech normal.        Behavior: Behavior normal.        Thought Content: Thought content normal.        Cognition and Memory: Cognition and memory normal.      Assessment:   Pleasant 50 year old female presents for follow-up on abdominal pain, GERD, constipation.  No red flag/warning signs or symptoms today.  Overall she is stable.  GERD improved for constipation still requiring further treatment.  GERD: Currently doing well on Protonix which she feels is working better than most other PPIs.  Recent EGD with dilation June 2021 but she is having recurrent dysphagia.  Recommend she continue her current PPI at the current dosage.  Dysphagia: Noted solid food dysphagia despite EGD with dilation in June 2021.  Typical triggers include meat such as chicken.  She makes a conscious effort to eat slowly, chew adequately, keep fluids on hand.  No real regurgitation but rather feels the food is just slow going and get stuck for  a while but eventually does pass.  Given her recent EGD with dilation I will proceed with a barium pill esophagram for evaluation for possible dysmotility as well as to see if there is a recurrent stricture.  Further recommendations will follow.  Constipation: Still having constipation despite Amitiza, requiring milk of magnesia daily in order to have bowel movements with associated nausea and bloating, though no vomiting.  Intermittent mid abdominal cramping that improves after a bowel movement reminiscent of abdominal pain due to constipation versus IBS.  At this point we will have her hold Amitiza and we will try her on Trulance.  Request a progress report 1 to 2 weeks at which point we can make other changes as needed.   Plan: 1. Continue Protonix at current dosage 2. Stop Amitiza 3. Start Trulance 3 mg daily 4. Progress report in 1-2 weeks 5. BPE 6. Follow-up in 2 months    Thank you for allowing [place] to participate in the care of Kelena M Nomura  Eric Gill, DNP, AGNP-C Adult & Gerontological Nurse Practitioner [facility]   01/28/2020 11:24 AM   Disclaimer: This note was dictated with voice recognition software.  Similar sounding words can inadvertently be transcribed and may not be corrected upon review.

## 2020-02-01 ENCOUNTER — Ambulatory Visit (HOSPITAL_COMMUNITY): Payer: 59

## 2020-02-02 ENCOUNTER — Ambulatory Visit (INDEPENDENT_AMBULATORY_CARE_PROVIDER_SITE_OTHER): Payer: 59 | Admitting: Family Medicine

## 2020-02-08 ENCOUNTER — Telehealth (HOSPITAL_COMMUNITY): Payer: Self-pay | Admitting: Clinical

## 2020-02-08 ENCOUNTER — Ambulatory Visit (HOSPITAL_COMMUNITY): Payer: Self-pay | Admitting: Clinical

## 2020-02-08 ENCOUNTER — Other Ambulatory Visit: Payer: Self-pay

## 2020-02-08 NOTE — Telephone Encounter (Signed)
Pt did not respond to video link, phone call, or VM 

## 2020-02-09 ENCOUNTER — Encounter: Payer: Medicaid Other | Admitting: Nurse Practitioner

## 2020-02-09 ENCOUNTER — Encounter (HOSPITAL_COMMUNITY): Payer: Self-pay | Admitting: *Deleted

## 2020-02-11 ENCOUNTER — Encounter: Payer: Medicaid Other | Admitting: Nurse Practitioner

## 2020-02-11 NOTE — Progress Notes (Signed)
Cc'ed to pcp °

## 2020-02-17 ENCOUNTER — Encounter: Payer: 59 | Admitting: Family Medicine

## 2020-02-18 ENCOUNTER — Other Ambulatory Visit: Payer: Self-pay

## 2020-02-18 ENCOUNTER — Ambulatory Visit (INDEPENDENT_AMBULATORY_CARE_PROVIDER_SITE_OTHER): Payer: 59 | Admitting: Family Medicine

## 2020-02-18 ENCOUNTER — Telehealth: Payer: Self-pay | Admitting: Internal Medicine

## 2020-02-18 ENCOUNTER — Encounter (INDEPENDENT_AMBULATORY_CARE_PROVIDER_SITE_OTHER): Payer: Self-pay | Admitting: Family Medicine

## 2020-02-18 ENCOUNTER — Other Ambulatory Visit (HOSPITAL_COMMUNITY): Payer: Self-pay | Admitting: Family Medicine

## 2020-02-18 VITALS — BP 114/77 | HR 77 | Temp 98.0°F | Ht 62.0 in | Wt 198.0 lb

## 2020-02-18 DIAGNOSIS — E8881 Metabolic syndrome: Secondary | ICD-10-CM | POA: Diagnosis not present

## 2020-02-18 DIAGNOSIS — Z9884 Bariatric surgery status: Secondary | ICD-10-CM | POA: Diagnosis not present

## 2020-02-18 DIAGNOSIS — Z6836 Body mass index (BMI) 36.0-36.9, adult: Secondary | ICD-10-CM

## 2020-02-18 DIAGNOSIS — E88819 Insulin resistance, unspecified: Secondary | ICD-10-CM

## 2020-02-18 MED ORDER — WEGOVY 1 MG/0.5ML ~~LOC~~ SOAJ: 1.0000 mg | SUBCUTANEOUS | 0 refills | Status: DC

## 2020-02-18 NOTE — Telephone Encounter (Signed)
Spoke to the pt was advised she went to [facility] ,[address] and was advised her Rx wasn't covered. She couldn't remember the name of it and we haven't received anything from the pharmacy yet.

## 2020-02-18 NOTE — Telephone Encounter (Signed)
[phone] PATIENT CALLED AND SAID THAT THE PRESCRIPTION DR CARVER WROTE FOR HER IS NOT COVERED BY HER INSURANCE

## 2020-02-19 NOTE — Telephone Encounter (Signed)
Phoned the pt back and was advised the Rx was for Trulance. She stated her insurance will not cover it. I advised her to call them back to have them to fax something to [place] so we can do a PA for her since she has been on Linzess and Amitiza. She agreed

## 2020-02-22 ENCOUNTER — Telehealth (INDEPENDENT_AMBULATORY_CARE_PROVIDER_SITE_OTHER): Payer: Self-pay

## 2020-02-22 ENCOUNTER — Other Ambulatory Visit: Payer: Self-pay

## 2020-02-22 ENCOUNTER — Ambulatory Visit (INDEPENDENT_AMBULATORY_CARE_PROVIDER_SITE_OTHER): Payer: 59 | Admitting: Nurse Practitioner

## 2020-02-22 ENCOUNTER — Encounter: Payer: Self-pay | Admitting: Nurse Practitioner

## 2020-02-22 VITALS — BP 120/80 | HR 82 | Temp 98.1°F | Resp 18 | Ht 62.0 in | Wt 204.0 lb

## 2020-02-22 DIAGNOSIS — Z0000 Encounter for general adult medical examination without abnormal findings: Secondary | ICD-10-CM

## 2020-02-22 DIAGNOSIS — M25512 Pain in left shoulder: Secondary | ICD-10-CM

## 2020-02-22 DIAGNOSIS — Z23 Encounter for immunization: Secondary | ICD-10-CM | POA: Diagnosis not present

## 2020-02-22 NOTE — Assessment & Plan Note (Signed)
Wt Readings from Last 3 Encounters:  02/22/20 204 lb (92.5 kg)  02/18/20 198 lb (89.8 kg)  01/28/20 205 lb (93 kg)   -weight has been stable -BMI 37.3

## 2020-02-22 NOTE — Telephone Encounter (Signed)
Patient called about her coupon for

## 2020-02-22 NOTE — Assessment & Plan Note (Signed)
-  physical exam performed today -labs drawn this AM; will call with results

## 2020-02-22 NOTE — Progress Notes (Signed)
Established Patient Office Visit  Subjective:  Patient ID: Brooke [name], female    DOB: [birth year]  Age: 50 y.o. MRN: [id number]  CC:  Chief Complaint  Patient presents with  . Annual Exam    Had fasting labs this morning.     HPI Brooke [name] presents for physical exams. Labs were drawn this AM, so no results to review today. No acute concerns. She states her left shoulder has been hurting since she fell at the end of 2021. She states she can't raise her shoulder like she could previously. He fell on an outstretched hand about 2-3 months ago.    Past Medical History:  Diagnosis Date  . Acid reflux   . Amenorrhea 02/06/2012  . Anxiety   . Arthritis    [name] 10/13/2019  . B12 deficiency   . Breast lump 08/06/2019  . Cervical radiculitis   . Chest pain   . Chronic constipation   . DDD (degenerative disc disease), lumbar   . Depression   . Depression    [name] 10/13/2019  . Dizzy spells   . Elevated vitamin B12 level 05/04/2019  . Esophageal dysphagia 11/19/2012  . Facial numbness   . Fatty liver   . GERD (gastroesophageal reflux disease)    [name] 10/13/2019  . Headache(784.0) 04/01/2012  . Heart palpitations 01/2017  . High cholesterol   . History of anemia   . History of hiatal hernia   . Hypertension   . Insomnia   . Intractable migraine with visual aura and without status migrainosus 01/23/2017  . Iron deficiency anemia   . Irregular periods 08/06/2019  . Joint pain   . Lactose intolerance   . LUQ pain 11/19/2012  . Menopausal symptom 08/06/2019  . Migraines    occ  . Near syncope 01/2017  . Numbness and tingling 10/08/2016   Formatting of this note might be different from the original. ---Oct 2018-TEE----Normal left ventricular size and systolic function with no appreciable segmental abnormality. EF 60% There was no evidence of spontaneous echo contrast or thrombus in the left atrium or left atrial appendage. No significant valvular abnormalites noted  Bubble study performed, this is negative.  . Numbness and tingling in left arm   . Obesity   . Other malaise and fatigue 05/19/2012  . Panic attacks   . Sciatica   . Seizures ([place])   . Slurred speech 11/07/2016   Formatting of this note might be different from the original. ---Oct 2018-TEE----Normal left ventricular size and systolic function with no appreciable segmental abnormality. EF 60% There was no evidence of spontaneous echo contrast or thrombus in the left atrium or left atrial appendage. No significant valvular abnormalites noted Bubble study performed, this is negative.  . Small bowel obstruction ([place]) 07/20/2017  . Spells of speech arrest 01/23/2017  . Transient cerebral ischemia 10/08/2016   Formatting of this note might be different from the original. ---Oct 2018-TEE----Normal left ventricular size and systolic function with no appreciable segmental abnormality. EF 60% There was no evidence of spontaneous echo contrast or thrombus in the left atrium or left atrial appendage. No significant valvular abnormalites noted Bubble study performed, this is negative.  . Vitamin D deficiency   . Word finding difficulty 01/23/2017    Past Surgical History:  Procedure Laterality Date  . BUNIONECTOMY Left yrs ago  . CERVICAL ABLATION  2017  . COLONOSCOPY, ESOPHAGOGASTRODUODENOSCOPY (EGD) AND ESOPHAGEAL DILATION N/A 12/03/2012   JIR:CVELFYBO melanosis throughout the entire examined colon/The colon  IS redundant/Small internal hemorrhoids/EGD:Esophageal web/Medium sized hiatal hernia/MILD Non-erosive gastritis  . ESOPHAGOGASTRODUODENOSCOPY  03/09/09   Dr. Wilford Corner, normal EGD, s/p Bravo capsule placement  . ESOPHAGOGASTRODUODENOSCOPY N/A 06/24/2019   rourk: Status post gastric bypass procedure, normal esophagus status post dilation  . EYE SURGERY N/A    [name] 10/13/2019  . GASTRIC ROUX-EN-Y N/A 07/16/2017   Procedure: LAPAROSCOPIC ROUX-EN-Y GASTRIC BYPASS WITH UPPER ENDOSCOPY AND ERAS  PATHWAY;  Surgeon: Johnathan Hausen, MD;  Location: [facility];  Service: General;  Laterality: N/A;  . LAPAROSCOPY N/A 07/20/2017   Procedure: LAPAROSCOPY DIAGNOSTIC. REDUCTION OF SMALL BOWEL OBSTRUCTION. REPAIR OF TROCAR HERNIA.;  Surgeon: Alphonsa Overall, MD;  Location: [facility];  Service: General;  Laterality: N/A;  Venia Minks DILATION N/A 06/24/2019   Procedure: Keturah Shavers;  Surgeon: Daneil Dolin, MD;  Location: [facility];  Service: Endoscopy;  Laterality: N/A;  . TUBAL LIGATION    . WISDOM TOOTH EXTRACTION      Family History  Problem Relation Age of Onset  . Diabetes Mother   . Hypertension Mother   . Drug abuse Mother   . Anxiety disorder Mother   . Depression Mother   . CAD Mother        CABG in 62s  . Lung cancer Mother   . Hyperlipidemia Mother   . Heart disease Mother   . Cancer Mother   . Obesity Mother   . Hypertension Father   . Sudden death Father   . Diabetes Sister   . Hypertension Sister   . Hypertension Brother   . Drug abuse Brother   . CAD Brother        s/p CABG in 10s  . Diabetes Paternal Grandmother   . Hypertension Brother   . Drug abuse Brother   . CAD Brother        "HEart artery blockages" in 32s  . Hypertension Brother   . Drug abuse Brother   . Anxiety disorder Maternal Grandmother   . Depression Maternal Grandmother   . Breast cancer Maternal Grandmother        breast  . Asthma Other   . Heart disease Other   . Colon cancer Neg Hx   . Gastric cancer Neg Hx   . Esophageal cancer Neg Hx     Social History   Socioeconomic History  . Marital status: Married    Spouse name: Randall Hiss   . Number of children: 2  . Years of education: Not on file  . Highest education level: Some college, no degree  Occupational History  . Occupation: stay at home  Tobacco Use  . Smoking status: Former Smoker    Packs/day: 0.30    Years: 23.00    Pack years: 6.90    Types: Cigarettes    Quit date: 10/04/2012    Years since quitting: 7.3  . Smokeless  tobacco: Never Used  . Tobacco comment: less than 1/2 pack cigarettes daily  Vaping Use  . Vaping Use: Never used  Substance and Sexual Activity  . Alcohol use: Yes    Comment: weekends; hardly/social  . Drug use: No  . Sexual activity: Yes    Partners: Male    Birth control/protection: Surgical  Other Topics Concern  . Not on file  Social History Narrative   Lives with husband, married 33 years    57 son Teron    67 son Jiles Harold -two grandchildren    Live close by    Rising cousin-custody of  her daughter 64 Primitivo Gauze       Right handed   Pets: none      Enjoys: [employer], shopping, likes being outside       Diet: eggs, oatmeal, salad, all food groups no lot of proteins, good on veggies.    Caffeine: sweet tea-2 cups  Coffee-1 cup daily    Water: 2-3 16 oz bottles daily       Wears seat belt    Smoke and carbon monoxide detectors   Does use phone while driving but hands free   Social Determinants of Health   Financial Resource Strain: Not on file  Food Insecurity: Not on file  Transportation Needs: Not on file  Physical Activity: Not on file  Stress: Not on file  Social Connections: Not on file  Intimate Partner Violence: Not on file    Outpatient Medications Prior to Visit  Medication Sig Dispense Refill  . acetaminophen (TYLENOL) 500 MG tablet Take 1,000 mg by mouth daily as needed for moderate pain or headache.    . Biotin 10000 MCG TABS Take 5,000 mcg by mouth every other day.     . calcium carbonate (OSCAL) 1500 (600 Ca) MG TABS tablet Take 600 mg by mouth daily.    . diclofenac Sodium (VOLTAREN) 1 % GEL Apply 1 g topically 3 (three) times daily as needed for pain.    . fluticasone (FLONASE) 50 MCG/ACT nasal spray Place 2 sprays into both nostrils daily. (Patient taking differently: Place 2 sprays into both nostrils daily. As needed) 16 g 1  . levETIRAcetam (KEPPRA) 250 MG tablet Take 250 mg by mouth at bedtime.    Marland Kitchen levocetirizine (XYZAL) 5 MG tablet Take 1 tablet (5  mg total) by mouth every evening. (Patient taking differently: Take 5 mg by mouth every evening. As needed) 30 tablet 2  . lubiprostone (AMITIZA) 24 MCG capsule Take 1 capsule (24 mcg total) by mouth 2 (two) times daily with a meal. 180 capsule 3  . Multiple Vitamins-Minerals (BARIATRIC MULTIVITAMINS/IRON PO) Take 1 tablet by mouth daily.     . ondansetron (ZOFRAN) 4 MG tablet Take 1 tablet (4 mg total) by mouth every 8 (eight) hours as needed for nausea or vomiting. 12 tablet 0  . pantoprazole (PROTONIX) 40 MG tablet Take 1 tablet (40 mg total) by mouth 2 (two) times daily before a meal. 180 tablet 3  . Plecanatide (TRULANCE) 3 MG TABS Take 1 tablet by mouth daily. 30 tablet 2  . QUEtiapine (SEROQUEL) 25 MG tablet Take 1 tablet (25 mg total) by mouth at bedtime. 30 tablet 1  . Semaglutide-Weight Management (WEGOVY) 1 MG/0.5ML SOAJ Inject 1 mg into the skin once a week. 2 mL 0  . topiramate (TOPAMAX) 100 MG tablet Take 100 mg by mouth 2 (two) times daily.     Marland Kitchen venlafaxine XR (EFFEXOR-XR) 150 MG 24 hr capsule Total of 225 mg along with 75 mg tab 90 capsule 0  . venlafaxine XR (EFFEXOR-XR) 75 MG 24 hr capsule 225 mg daily. Take along with 150 mg cap 90 capsule 0  . Vitamin D, Ergocalciferol, (DRISDOL) 1.25 MG (50000 UNIT) CAPS capsule Take 1 capsule (50,000 Units total) by mouth every 7 (seven) days. 12 capsule 1   No facility-administered medications prior to visit.    No Known Allergies  ROS Review of Systems  Constitutional: Negative.   HENT: Negative.   Eyes: Negative.   Respiratory: Negative.   Cardiovascular: Negative.   Gastrointestinal: Negative.  Endocrine: Negative.   Genitourinary: Negative.   Musculoskeletal: Positive for arthralgias.       Left shoulder pain  Skin: Negative.   Neurological: Negative.   Hematological: Negative.   Psychiatric/Behavioral: Negative.       Objective:    Physical Exam Constitutional:      Appearance: She is obese.  HENT:     Head:  Normocephalic and atraumatic.     Right Ear: Tympanic membrane, ear canal and external ear normal.     Left Ear: Tympanic membrane, ear canal and external ear normal.     Nose: Nose normal.     Mouth/Throat:     Mouth: Mucous membranes are moist.     Pharynx: Oropharynx is clear.  Eyes:     Extraocular Movements: Extraocular movements intact.     Conjunctiva/sclera: Conjunctivae normal.     Pupils: Pupils are equal, round, and reactive to light.  Cardiovascular:     Rate and Rhythm: Normal rate and regular rhythm.     Pulses: Normal pulses.     Heart sounds: Normal heart sounds.  Pulmonary:     Effort: Pulmonary effort is normal.     Breath sounds: Normal breath sounds.  Abdominal:     General: Abdomen is flat. Bowel sounds are normal.     Palpations: Abdomen is soft.  Musculoskeletal:     Cervical back: Normal range of motion and neck supple.     Comments: Left shoulder pain with interna/external rotation; positive empty can test  Skin:    General: Skin is warm and dry.     Capillary Refill: Capillary refill takes less than 2 seconds.  Neurological:     General: No focal deficit present.     Mental Status: She is alert and oriented to person, place, and time.  Psychiatric:        Mood and Affect: Mood normal.        Behavior: Behavior normal.        Thought Content: Thought content normal.        Judgment: Judgment normal.     BP 120/80   Pulse 82   Temp 98.1 F (36.7 C)   Resp 18   Ht 5\' 2"  (1.575 m)   Wt 204 lb (92.5 kg)   SpO2 97%   BMI 37.31 kg/m  Wt Readings from Last 3 Encounters:  02/22/20 204 lb (92.5 kg)  02/18/20 198 lb (89.8 kg)  01/28/20 205 lb (93 kg)     Health Maintenance Due  Topic Date Due  . TETANUS/TDAP  Never done  . COVID-19 Vaccine (3 - Booster for Pfizer series) 10/03/2019    There are no preventive care reminders to display for this patient.  Lab Results  Component Value Date   TSH 1.390 09/14/2019   Lab Results  Component  Value Date   WBC 5.0 10/22/2019   HGB 13.3 10/22/2019   HCT 40.0 10/22/2019   MCV 92 10/22/2019   PLT 349 10/22/2019   Lab Results  Component Value Date   NA 141 10/22/2019   K 4.7 10/22/2019   CO2 21 10/22/2019   GLUCOSE 81 10/22/2019   BUN 9 10/22/2019   CREATININE 0.93 10/22/2019   BILITOT 0.3 10/22/2019   ALKPHOS 96 10/22/2019   AST 24 10/22/2019   ALT 34 (H) 10/22/2019   PROT 6.6 10/22/2019   ALBUMIN 4.1 10/22/2019   CALCIUM 8.8 10/22/2019   ANIONGAP 8 10/15/2018   Lab Results  Component Value Date  CHOL 184 09/14/2019   Lab Results  Component Value Date   HDL 60 09/14/2019   Lab Results  Component Value Date   LDLCALC 109 (H) 09/14/2019   Lab Results  Component Value Date   TRIG 84 09/14/2019   Lab Results  Component Value Date   CHOLHDL 3.4 08/05/2019   Lab Results  Component Value Date   HGBA1C 5.2 09/14/2019      Assessment & Plan:   Problem List Items Addressed This Visit      Other   Morbid obesity ([place])    Wt Readings from Last 3 Encounters:  02/22/20 204 lb (92.5 kg)  02/18/20 198 lb (89.8 kg)  01/28/20 205 lb (93 kg)   -weight has been stable -BMI 37.3      Preventative health care - Primary    -physical exam performed today -labs drawn this AM; will call with results      Left shoulder pain    -fell 2-3 months ago and is still having pain around 7/10 -with empty can test and painful ROM, concerned for rotator cuff injury -we discussed referral to ortho, and she states that she already sees ortho for this      Immunization due    -TDaP administered today         No orders of the defined types were placed in this encounter.   Follow-up: Return in about 6 months (around 08/21/2020) for Lab follow-up .    Noreene Larsson, NP

## 2020-02-22 NOTE — Addendum Note (Signed)
Addended by: Lonn Georgia on: 02/22/2020 09:30 AM   Modules accepted: Orders

## 2020-02-22 NOTE — Assessment & Plan Note (Signed)
-  fell 2-3 months ago and is still having pain around 7/10 -with empty can test and painful ROM, concerned for rotator cuff injury -we discussed referral to ortho, and she states that she already sees ortho for this

## 2020-02-22 NOTE — Progress Notes (Unsigned)
Chief Complaint:   OBESITY Brooke [name] is here to discuss her progress with her obesity treatment plan along with follow-up of her obesity related diagnoses. Brooke [name] is on the [employer] and states she is following her eating plan approximately 75% of the time. Brooke [name] states she is using resistance bands 30-60 minutes 2 times per week.  Today's visit was #: 7 Starting weight: 217 lbs Starting date: 09/14/2019 Today's weight: 198 lbs Today's date: 02/18/2020 Total lbs lost to date: 19 lbs Total lbs lost since last in-office visit: 2 lbs  Interim History: Pt has been eating on plan 75% of the time. The most difficult part is controlling snacking and sweets indulgences. Pt doesn't think she is hungry when snacking. When indulging, pt will do 1/2 cup dry cereal, hard candy, and potato chip snack bags.  Subjective:   1. Insulin resistance Pt is still experiencing some carb cravings. She is on GLP-1.  2. H/O bariatric surgery Pt is still restricted in terms of quantity of foods and now on GLP-1.  Assessment/Plan:   1. Insulin resistance Brooke [name] will continue to work on weight loss, exercise, and decreasing simple carbohydrates to help decrease the risk of diabetes. Brooke [name] agreed to follow-up with [place] as directed to closely monitor her progress. Check labs at next appointment.  2. H/O bariatric surgery Follow up with compliance at next appointment. Contemplate change in dosage of Wegovy pending pt's ability to eat all food on plan.  3. Class 2 severe obesity with serious comorbidity and body mass index (BMI) of 36.0 to 36.9 in adult, unspecified obesity type (HCC) Brooke [name] is currently in the action stage of change. As such, her goal is to continue with weight loss efforts. She has agreed to the Category 2 Plan and the [place], with decrease in carbs on meal plan when eating chips.   We discussed various medication options to help Brooke [name] with her weight loss efforts and we both  agreed to continue [facility], as per below. - Semaglutide-Weight Management (WEGOVY) 1 MG/0.5ML SOAJ; Inject 1 mg into the skin once a week.  Dispense: 2 mL; Refill: 0  Exercise goals: As is  Behavioral modification strategies: increasing lean protein intake, meal planning and cooking strategies and keeping healthy foods in the home.  Brooke [name] has agreed to follow-up with our clinic in 2-3 weeks. She was informed of the importance of frequent follow-up visits to maximize her success with intensive lifestyle modifications for her multiple health conditions.   Objective:   Blood pressure 114/77, pulse 77, temperature 98 F (36.7 C), temperature source Oral, height 5\' 2"  (1.575 m), weight 198 lb (89.8 kg), SpO2 98 %. Body mass index is 36.21 kg/m.  General: Cooperative, alert, well developed, in no acute distress. HEENT: Conjunctivae and lids unremarkable. Cardiovascular: Regular rhythm.  Lungs: Normal work of breathing. Neurologic: No focal deficits.   Lab Results  Component Value Date   CREATININE 0.93 10/22/2019   BUN 9 10/22/2019   NA 141 10/22/2019   K 4.7 10/22/2019   CL 109 (H) 10/22/2019   CO2 21 10/22/2019   Lab Results  Component Value Date   ALT 34 (H) 10/22/2019   AST 24 10/22/2019   ALKPHOS 96 10/22/2019   BILITOT 0.3 10/22/2019   Lab Results  Component Value Date   HGBA1C 5.2 09/14/2019   HGBA1C 5.3 08/05/2019   Lab Results  Component Value Date   INSULIN 5.9 09/14/2019   Lab Results  Component Value Date  TSH 1.390 09/14/2019   Lab Results  Component Value Date   CHOL 184 09/14/2019   HDL 60 09/14/2019   LDLCALC 109 (H) 09/14/2019   TRIG 84 09/14/2019   CHOLHDL 3.4 08/05/2019   Lab Results  Component Value Date   WBC 5.0 10/22/2019   HGB 13.3 10/22/2019   HCT 40.0 10/22/2019   MCV 92 10/22/2019   PLT 349 10/22/2019   Lab Results  Component Value Date   IRON 111 09/03/2019   TIBC 292 09/03/2019   FERRITIN 187 09/03/2019    Attestation  Statements:   Reviewed by clinician on day of visit: allergies, medications, problem list, medical history, surgical history, family history, social history, and previous encounter notes.  Time spent on visit including pre-visit chart review and post-visit care and charting was 15 minutes.   Coral Ceo, am acting as transcriptionist for Coralie Common, MD.   I have reviewed the above documentation for accuracy and completeness, and I agree with the above. - Jinny Blossom, MD

## 2020-02-22 NOTE — Assessment & Plan Note (Signed)
-  TDaP administered today 

## 2020-02-23 LAB — COMPREHENSIVE METABOLIC PANEL
ALT: 30 IU/L (ref 0–32)
AST: 30 IU/L (ref 0–40)
Albumin/Globulin Ratio: 1.7 (ref 1.2–2.2)
Albumin: 3.9 g/dL (ref 3.8–4.8)
Alkaline Phosphatase: 86 IU/L (ref 44–121)
BUN/Creatinine Ratio: 12 (ref 9–23)
BUN: 10 mg/dL (ref 6–24)
Bilirubin Total: 0.3 mg/dL (ref 0.0–1.2)
CO2: 21 mmol/L (ref 20–29)
Calcium: 8.6 mg/dL — ABNORMAL LOW (ref 8.7–10.2)
Chloride: 105 mmol/L (ref 96–106)
Creatinine, Ser: 0.84 mg/dL (ref 0.57–1.00)
GFR calc Af Amer: 94 mL/min/{1.73_m2} (ref >59)
GFR calc non Af Amer: 82 mL/min/{1.73_m2} (ref >59)
Globulin, Total: 2.3 g/dL (ref 1.5–4.5)
Glucose: 76 mg/dL (ref 65–99)
Potassium: 4.5 mmol/L (ref 3.5–5.2)
Sodium: 141 mmol/L (ref 134–144)
Total Protein: 6.2 g/dL (ref 6.0–8.5)
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
## 2020-02-26 ENCOUNTER — Encounter (INDEPENDENT_AMBULATORY_CARE_PROVIDER_SITE_OTHER): Payer: Self-pay | Admitting: Family Medicine

## 2020-02-26 DIAGNOSIS — E8881 Metabolic syndrome: Secondary | ICD-10-CM

## 2020-02-29 NOTE — Telephone Encounter (Signed)
Please advise 

## 2020-03-01 ENCOUNTER — Telehealth (INDEPENDENT_AMBULATORY_CARE_PROVIDER_SITE_OTHER): Payer: 59 | Admitting: Psychiatry

## 2020-03-01 ENCOUNTER — Other Ambulatory Visit: Payer: Self-pay

## 2020-03-01 ENCOUNTER — Encounter: Payer: Self-pay | Admitting: Psychiatry

## 2020-03-01 DIAGNOSIS — F330 Major depressive disorder, recurrent, mild: Secondary | ICD-10-CM

## 2020-03-01 DIAGNOSIS — F410 Panic disorder [episodic paroxysmal anxiety] without agoraphobia: Secondary | ICD-10-CM | POA: Diagnosis not present

## 2020-03-01 MED ORDER — QUETIAPINE FUMARATE 50 MG PO TABS
50.0000 mg | ORAL_TABLET | Freq: Every day | ORAL | 1 refills | Status: DC
Start: 2020-03-01 — End: 2020-04-05

## 2020-03-01 NOTE — Telephone Encounter (Signed)
Please advise 

## 2020-03-01 NOTE — Patient Instructions (Signed)
1.Continuevenlafaxine225mg at night 2.Increase quetiapine 50 mg at night  4. Next appointment: 4/5 at 9 AM

## 2020-03-08 ENCOUNTER — Other Ambulatory Visit: Payer: Self-pay

## 2020-03-08 ENCOUNTER — Ambulatory Visit (INDEPENDENT_AMBULATORY_CARE_PROVIDER_SITE_OTHER): Payer: 59 | Admitting: Adult Health

## 2020-03-08 ENCOUNTER — Encounter (INDEPENDENT_AMBULATORY_CARE_PROVIDER_SITE_OTHER): Payer: Self-pay | Admitting: Adult Health

## 2020-03-08 VITALS — BP 128/82 | HR 63 | Temp 98.4°F | Ht 62.0 in | Wt 197.0 lb

## 2020-03-08 DIAGNOSIS — K5900 Constipation, unspecified: Secondary | ICD-10-CM | POA: Diagnosis not present

## 2020-03-08 DIAGNOSIS — E8881 Metabolic syndrome: Secondary | ICD-10-CM

## 2020-03-08 DIAGNOSIS — F419 Anxiety disorder, unspecified: Secondary | ICD-10-CM | POA: Diagnosis not present

## 2020-03-08 DIAGNOSIS — Z6836 Body mass index (BMI) 36.0-36.9, adult: Secondary | ICD-10-CM

## 2020-03-08 MED ORDER — TRULICITY 1.5 MG/0.5ML ~~LOC~~ SOAJ: 1.5000 mg | SUBCUTANEOUS | 0 refills | Status: DC

## 2020-03-08 NOTE — Telephone Encounter (Signed)
Please advise 

## 2020-03-09 NOTE — Progress Notes (Signed)
Chief Complaint:   OBESITY Brooke [name] is here to discuss her progress with her obesity treatment plan along with follow-up of her obesity related diagnoses. [name] is on the Category 2 Plan and the [place] and states she is following her eating plan approximately 75% of the time. [name] states she is walking 30 minutes 3 times per week.  Today's visit was #: 8 Starting weight: 217 lbs Starting date: 09/14/2019 Today's weight: 197 lbs Today's date: 03/08/2020 Total lbs lost to date: 20 Total lbs lost since last in-office visit: 1 lb  Interim History: [name] had her annual physical with her PCP on 02/22/2020 and had labs drawn. We reviewed labs in office today. She has been off Wegovy 1 mg for about 2 weeks due to cost of prescription. I recommended that she call her insurance and inquire which GLP-1 are covered.  Subjective:   1. Insulin resistance Brooke [name] was started on Wegovy and titrated up to 1 mg. She tolerated it well. She has been off [facility] for 2 weeks due to cost.  Lab Results  Component Value Date   INSULIN 5.9 09/14/2019   Lab Results  Component Value Date   HGBA1C 5.2 09/14/2019    2. Anxiety Brooke [name] was previously on Buspar. She was unable to tolerate due to feeling ill with Rx. She was previously on hydroxyzine and stopped it. She was started on mirtazapine early January 2022. She reports stable mood. Weight has been stable since starting this Rx.  3. Constipation, unspecified constipation type Brooke [name] will have 1 BM per week with Amitiza 24 mg BID. She will need to add in milk of magnesia every few weeks to stimulate bowel motility.    Assessment/Plan:   1. Insulin resistance Brooke [name] will continue to work on weight loss, exercise, and decreasing simple carbohydrates to help decrease the risk of diabetes. Brooke [name] agreed to follow-up with [place] as directed to closely monitor her progress. Call insurance company and inquire which GLP-1 are covered and for what  disorder.  2. Anxiety Behavior modification techniques were discussed today to help Brooke [name] deal with her anxiety.  Orders and follow up as documented in patient record. Continue close follow up with [facility].  3. Constipation, unspecified constipation type Brooke [name] was informed that a decrease in bowel movement frequency is normal while losing weight, but stools should not be hard or painful. Orders and follow up as documented in patient record.   Counseling Getting to Good Bowel Health: Your goal is to have one soft bowel movement each day. Drink at least 8 glasses of water each day. Eat plenty of fiber (goal is over 25 grams each day). It is best to get most of your fiber from dietary sources which includes leafy green vegetables, fresh fruit, and whole grains. You may need to add fiber with the help of OTC fiber supplements. These include Metamucil, Citrucel, and Flaxseed. If you are still having trouble, try adding Miralax or Magnesium Citrate. If all of these changes do not work, Cabin crew. Follow up with GI to schedule due screening colonoscopy.  4. Class 2 severe obesity with serious comorbidity and body mass index (BMI) of 36.0 to 36.9 in adult, unspecified obesity type (HCC) Brooke [name] is currently in the action stage of change. As such, her goal is to continue with weight loss efforts. She has agreed to the [employer].   Measure out protein at dinner with food scale. Fasting labs at next OV.  Exercise goals: As  is  Behavioral modification strategies: increasing lean protein intake, decreasing simple carbohydrates, meal planning and cooking strategies and planning for success.  Brooke [name] has agreed to follow-up with our clinic in 2 weeks. She was informed of the importance of frequent follow-up visits to maximize her success with intensive lifestyle modifications for her multiple health conditions.   Objective:   Blood pressure 128/82, pulse 63, temperature 98.4 F  (36.9 C), height 5\' 2"  (1.575 m), weight 197 lb (89.4 kg), SpO2 99 %. Body mass index is 36.03 kg/m.  General: Cooperative, alert, well developed, in no acute distress. HEENT: Conjunctivae and lids unremarkable. Cardiovascular: Regular rhythm.  Lungs: Normal work of breathing. Neurologic: No focal deficits.   Lab Results  Component Value Date   CREATININE 0.84 02/22/2020   BUN 10 02/22/2020   NA 141 02/22/2020   K 4.5 02/22/2020   CL 105 02/22/2020   CO2 21 02/22/2020   Lab Results  Component Value Date   ALT 30 02/22/2020   AST 30 02/22/2020   ALKPHOS 86 02/22/2020   BILITOT 0.3 02/22/2020   Lab Results  Component Value Date   HGBA1C 5.2 09/14/2019   HGBA1C 5.3 08/05/2019   Lab Results  Component Value Date   INSULIN 5.9 09/14/2019   Lab Results  Component Value Date   TSH 1.390 09/14/2019   Lab Results  Component Value Date   CHOL 184 09/14/2019   HDL 60 09/14/2019   LDLCALC 109 (H) 09/14/2019   TRIG 84 09/14/2019   CHOLHDL 3.4 08/05/2019   Lab Results  Component Value Date   WBC 5.0 10/22/2019   HGB 13.3 10/22/2019   HCT 40.0 10/22/2019   MCV 92 10/22/2019   PLT 349 10/22/2019   Lab Results  Component Value Date   IRON 111 09/03/2019   TIBC 292 09/03/2019   FERRITIN 187 09/03/2019   Attestation Statements:   Reviewed by clinician on day of visit: allergies, medications, problem list, medical history, surgical history, family history, social history, and previous encounter notes.  Time spent on visit including pre-visit chart review and post-visit care and charting was 32 minutes.   Coral Ceo, am acting as Location manager for Mina Marble, NP.  I have reviewed the above documentation for accuracy and completeness, and I agree with the above. -  Dennice Tindol d. Lilah Mijangos, NP-C

## 2020-03-21 ENCOUNTER — Telehealth: Payer: Self-pay

## 2020-03-21 ENCOUNTER — Other Ambulatory Visit: Payer: Self-pay | Admitting: Psychiatry

## 2020-03-21 MED ORDER — VENLAFAXINE HCL ER 150 MG PO CP24: ORAL_CAPSULE | ORAL | 1 refills | Status: DC

## 2020-03-21 NOTE — Telephone Encounter (Signed)
Ordered

## 2020-03-21 NOTE — Telephone Encounter (Signed)
pt neeeds a venlaxfazine 150mg 

## 2020-03-22 ENCOUNTER — Encounter: Payer: Self-pay | Admitting: Family Medicine

## 2020-03-24 ENCOUNTER — Encounter (INDEPENDENT_AMBULATORY_CARE_PROVIDER_SITE_OTHER): Payer: Self-pay | Admitting: Adult Health

## 2020-03-24 ENCOUNTER — Other Ambulatory Visit: Payer: Self-pay

## 2020-03-24 ENCOUNTER — Ambulatory Visit (INDEPENDENT_AMBULATORY_CARE_PROVIDER_SITE_OTHER): Payer: 59 | Admitting: Adult Health

## 2020-03-24 VITALS — BP 116/78 | HR 80 | Temp 98.4°F | Ht 62.0 in | Wt 198.0 lb

## 2020-03-24 DIAGNOSIS — E8881 Metabolic syndrome: Secondary | ICD-10-CM

## 2020-03-24 DIAGNOSIS — E559 Vitamin D deficiency, unspecified: Secondary | ICD-10-CM

## 2020-03-24 DIAGNOSIS — Z6839 Body mass index (BMI) 39.0-39.9, adult: Secondary | ICD-10-CM

## 2020-03-24 DIAGNOSIS — Z9189 Other specified personal risk factors, not elsewhere classified: Secondary | ICD-10-CM

## 2020-03-24 DIAGNOSIS — E7849 Other hyperlipidemia: Secondary | ICD-10-CM | POA: Insufficient documentation

## 2020-03-24 DIAGNOSIS — E785 Hyperlipidemia, unspecified: Secondary | ICD-10-CM | POA: Insufficient documentation

## 2020-03-24 MED ORDER — TRULICITY 1.5 MG/0.5ML ~~LOC~~ SOAJ: 1.5000 mg | SUBCUTANEOUS | 0 refills | Status: DC

## 2020-03-25 ENCOUNTER — Encounter: Payer: Self-pay | Admitting: Internal Medicine

## 2020-03-25 ENCOUNTER — Ambulatory Visit (INDEPENDENT_AMBULATORY_CARE_PROVIDER_SITE_OTHER): Payer: 59 | Admitting: Internal Medicine

## 2020-03-25 VITALS — BP 127/84 | HR 68 | Resp 15 | Ht 62.0 in | Wt 203.0 lb

## 2020-03-25 DIAGNOSIS — D170 Benign lipomatous neoplasm of skin and subcutaneous tissue of head, face and neck: Secondary | ICD-10-CM

## 2020-03-25 LAB — COMPREHENSIVE METABOLIC PANEL
ALT: 35 IU/L — ABNORMAL HIGH (ref 0–32)
AST: 21 IU/L (ref 0–40)
Albumin/Globulin Ratio: 1.7 (ref 1.2–2.2)
Albumin: 4 g/dL (ref 3.8–4.8)
Alkaline Phosphatase: 74 IU/L (ref 44–121)
BUN/Creatinine Ratio: 9 (ref 9–23)
BUN: 7 mg/dL (ref 6–24)
Bilirubin Total: 0.3 mg/dL (ref 0.0–1.2)
CO2: 24 mmol/L (ref 20–29)
Calcium: 8.6 mg/dL — ABNORMAL LOW (ref 8.7–10.2)
Chloride: 104 mmol/L (ref 96–106)
Creatinine, Ser: 0.8 mg/dL (ref 0.57–1.00)
Globulin, Total: 2.3 g/dL (ref 1.5–4.5)
Glucose: 78 mg/dL (ref 65–99)
Potassium: 4.5 mmol/L (ref 3.5–5.2)
Sodium: 143 mmol/L (ref 134–144)
Total Protein: 6.3 g/dL (ref 6.0–8.5)
eGFR: 90 mL/min/{1.73_m2} (ref >59)

## 2020-03-25 LAB — VITAMIN D 25 HYDROXY (VIT D DEFICIENCY, FRACTURES): Vit D, 25-Hydroxy: 56.4 ng/mL (ref 30.0–100.0)

## 2020-03-25 LAB — LIPID PANEL
Chol/HDL Ratio: 3 ratio (ref 0.0–4.4)
Cholesterol, Total: 193 mg/dL (ref 100–199)
HDL: 64 mg/dL (ref >39)
LDL Chol Calc (NIH): 117 mg/dL — ABNORMAL HIGH (ref 0–99)
Triglycerides: 66 mg/dL (ref 0–149)
VLDL Cholesterol Cal: 12 mg/dL (ref 5–40)

## 2020-03-25 LAB — HEMOGLOBIN A1C
Est. average glucose Bld gHb Est-mCnc: 100 mg/dL
Hgb A1c MFr Bld: 5.1 % (ref 4.8–5.6)

## 2020-03-25 LAB — INSULIN, RANDOM: INSULIN: 5.1 u[IU]/mL (ref 2.6–24.9)

## 2020-03-25 NOTE — Patient Instructions (Signed)
Lipoma  A lipoma is a noncancerous (benign) tumor that is made up of fat cells. This is a very common type of soft-tissue growth. Lipomas are usually found under the skin (subcutaneous). They may occur in any tissue of the body that contains fat. Common areas for lipomas to appear include the back, arms, shoulders, buttocks, and thighs. Lipomas grow slowly, and they are usually painless. Most lipomas do not cause problems and do not require treatment. What are the causes? The cause of this condition is not known. What increases the risk? You are more likely to develop this condition if:  You are 40-60 years old.  You have a family history of lipomas. What are the signs or symptoms? A lipoma usually appears as a small, round bump under the skin. In most cases, the lump will:  Feel soft or rubbery.  Not cause pain or other symptoms. However, if a lipoma is located in an area where it pushes on nerves, it can become painful or cause other symptoms. How is this diagnosed? A lipoma can usually be diagnosed with a physical exam. You may also have tests to confirm the diagnosis and to rule out other conditions. Tests may include:  Imaging tests, such as a CT scan or an MRI.  Removal of a tissue sample to be looked at under a microscope (biopsy). How is this treated? Treatment for this condition depends on the size of the lipoma and whether it is causing any symptoms.  For small lipomas that are not causing problems, no treatment is needed.  If a lipoma is bigger or it causes problems, surgery may be done to remove the lipoma. Lipomas can also be removed to improve appearance. Most often, the procedure is done after applying a medicine that numbs the area (local anesthetic).  Liposuction may be done to reduce the size of the lipoma before it is removed through surgery, or it may be done to remove the lipoma. Lipomas are removed with this method in order to limit incision size and scarring. A  liposuction tube is inserted through a small incision into the lipoma, and the contents of the lipoma are removed through the tube with suction. Follow these instructions at home:  Watch your lipoma for any changes.  Keep all follow-up visits as told by your health care provider. This is important. Contact a health care provider if:  Your lipoma becomes larger or hard.  Your lipoma becomes painful, red, or increasingly swollen. These could be signs of infection or a more serious condition. Get help right away if:  You develop tingling or numbness in an area near the lipoma. This could indicate that the lipoma is causing nerve damage. Summary  A lipoma is a noncancerous tumor that is made up of fat cells.  Most lipomas do not cause problems and do not require treatment.  If a lipoma is bigger or it causes problems, surgery may be done to remove the lipoma.  Contact a health care provider if your lipoma becomes larger or hard, or if it becomes painful, red, or increasingly swollen. Pain, redness, and swelling could be signs of infection or a more serious condition. This information is not intended to replace advice given to you by your health care provider. Make sure you discuss any questions you have with your health care provider. Document Revised: 08/04/2018 Document Reviewed: 08/04/2018 Elsevier Patient Education  2021 Elsevier Inc.  

## 2020-03-25 NOTE — Progress Notes (Signed)
Acute Office Visit  Subjective:    Patient ID: Brooke [name], female    DOB: 1970/08/20, 50 y.o.   MRN: [id number]  Chief Complaint  Patient presents with  . knot on neck     Has a small lump on the base of her neck/upper chest. Not sore. Noticed it about a week ago Quarter size    HPI Patient is in today for evaluation of a small lump over lower of part of her neck, near clavicle. She noticed it about a week ago, which has been stable in size, and is not painful. Denies any injury, insect bite or tick bite. No reports of redness or itching. Of note, she has had similar bumps over her thighs in the past, which have been self-resolving.   Past Medical History:  Diagnosis Date  . Acid reflux   . Amenorrhea 02/06/2012  . Anxiety   . Arthritis    [name] 10/13/2019  . B12 deficiency   . Breast lump 08/06/2019  . Cervical radiculitis   . Chest pain   . Chronic constipation   . DDD (degenerative disc disease), lumbar   . Depression   . Depression    [name] 10/13/2019  . Dizzy spells   . Elevated vitamin B12 level 05/04/2019  . Esophageal dysphagia 11/19/2012  . Facial numbness   . Fatty liver   . GERD (gastroesophageal reflux disease)    [name] 10/13/2019  . Headache(784.0) 04/01/2012  . Heart palpitations 01/2017  . High cholesterol   . History of anemia   . History of hiatal hernia   . Hypertension   . Insomnia   . Intractable migraine with visual aura and without status migrainosus 01/23/2017  . Iron deficiency anemia   . Irregular periods 08/06/2019  . Joint pain   . Lactose intolerance   . LUQ pain 11/19/2012  . Menopausal symptom 08/06/2019  . Migraines    occ  . Near syncope 01/2017  . Numbness and tingling 10/08/2016   Formatting of this note might be different from the original. ---Oct 2018-TEE----Normal left ventricular size and systolic function with no appreciable segmental abnormality. EF 60% There was no evidence of spontaneous echo contrast or thrombus in  the left atrium or left atrial appendage. No significant valvular abnormalites noted Bubble study performed, this is negative.  . Numbness and tingling in left arm   . Obesity   . Other malaise and fatigue 05/19/2012  . Panic attacks   . Sciatica   . Seizures ([place])   . Slurred speech 11/07/2016   Formatting of this note might be different from the original. ---Oct 2018-TEE----Normal left ventricular size and systolic function with no appreciable segmental abnormality. EF 60% There was no evidence of spontaneous echo contrast or thrombus in the left atrium or left atrial appendage. No significant valvular abnormalites noted Bubble study performed, this is negative.  . Small bowel obstruction ([place]) 07/20/2017  . Spells of speech arrest 01/23/2017  . Transient cerebral ischemia 10/08/2016   Formatting of this note might be different from the original. ---Oct 2018-TEE----Normal left ventricular size and systolic function with no appreciable segmental abnormality. EF 60% There was no evidence of spontaneous echo contrast or thrombus in the left atrium or left atrial appendage. No significant valvular abnormalites noted Bubble study performed, this is negative.  . Vitamin D deficiency   . Word finding difficulty 01/23/2017    Past Surgical History:  Procedure Laterality Date  . BUNIONECTOMY Left yrs ago  .  CERVICAL ABLATION  2017  . COLONOSCOPY, ESOPHAGOGASTRODUODENOSCOPY (EGD) AND ESOPHAGEAL DILATION N/A 12/03/2012   UVO:ZDGUYQIH melanosis throughout the entire examined colon/The colon IS redundant/Small internal hemorrhoids/EGD:Esophageal web/Medium sized hiatal hernia/MILD Non-erosive gastritis  . ESOPHAGOGASTRODUODENOSCOPY  03/09/09   Dr. Wilford Corner, normal EGD, s/p Bravo capsule placement  . ESOPHAGOGASTRODUODENOSCOPY N/A 06/24/2019   rourk: Status post gastric bypass procedure, normal esophagus status post dilation  . EYE SURGERY N/A    [name] 10/13/2019  . GASTRIC ROUX-EN-Y N/A 07/16/2017    Procedure: LAPAROSCOPIC ROUX-EN-Y GASTRIC BYPASS WITH UPPER ENDOSCOPY AND ERAS PATHWAY;  Surgeon: Johnathan Hausen, MD;  Location: [facility];  Service: General;  Laterality: N/A;  . LAPAROSCOPY N/A 07/20/2017   Procedure: LAPAROSCOPY DIAGNOSTIC. REDUCTION OF SMALL BOWEL OBSTRUCTION. REPAIR OF TROCAR HERNIA.;  Surgeon: Alphonsa Overall, MD;  Location: [facility];  Service: General;  Laterality: N/A;  Venia Minks DILATION N/A 06/24/2019   Procedure: Keturah Shavers;  Surgeon: Daneil Dolin, MD;  Location: [facility];  Service: Endoscopy;  Laterality: N/A;  . TUBAL LIGATION    . WISDOM TOOTH EXTRACTION      Family History  Problem Relation Age of Onset  . Diabetes Mother   . Hypertension Mother   . Drug abuse Mother   . Anxiety disorder Mother   . Depression Mother   . CAD Mother        CABG in 35s  . Lung cancer Mother   . Hyperlipidemia Mother   . Heart disease Mother   . Cancer Mother   . Obesity Mother   . Hypertension Father   . Sudden death Father   . Diabetes Sister   . Hypertension Sister   . Hypertension Brother   . Drug abuse Brother   . CAD Brother        s/p CABG in 20s  . Diabetes Paternal Grandmother   . Hypertension Brother   . Drug abuse Brother   . CAD Brother        "HEart artery blockages" in 83s  . Hypertension Brother   . Drug abuse Brother   . Anxiety disorder Maternal Grandmother   . Depression Maternal Grandmother   . Breast cancer Maternal Grandmother        breast  . Asthma Other   . Heart disease Other   . Colon cancer Neg Hx   . Gastric cancer Neg Hx   . Esophageal cancer Neg Hx     Social History   Socioeconomic History  . Marital status: Married    Spouse name: Randall Hiss   . Number of children: 2  . Years of education: Not on file  . Highest education level: Some college, no degree  Occupational History  . Occupation: stay at home  Tobacco Use  . Smoking status: Former Smoker    Packs/day: 0.30    Years: 23.00    Pack years: 6.90     Types: Cigarettes    Quit date: 10/04/2012    Years since quitting: 7.4  . Smokeless tobacco: Never Used  . Tobacco comment: less than 1/2 pack cigarettes daily  Vaping Use  . Vaping Use: Never used  Substance and Sexual Activity  . Alcohol use: Yes    Comment: weekends; hardly/social  . Drug use: No  . Sexual activity: Yes    Partners: Male    Birth control/protection: Surgical  Other Topics Concern  . Not on file  Social History Narrative   Lives with husband, married 31 years  77 son Teron    12 son Jiles Harold -two grandchildren    Live close by    Rising cousin-custody of her daughter 62 [place]       Right handed   Pets: none      Enjoys: [employer], shopping, likes being outside       Diet: eggs, oatmeal, salad, all food groups no lot of proteins, good on veggies.    Caffeine: sweet tea-2 cups  Coffee-1 cup daily    Water: 2-3 16 oz bottles daily       Wears seat belt    Smoke and carbon monoxide detectors   Does use phone while driving but hands free   Social Determinants of Health   Financial Resource Strain: Not on file  Food Insecurity: Not on file  Transportation Needs: Not on file  Physical Activity: Not on file  Stress: Not on file  Social Connections: Not on file  Intimate Partner Violence: Not on file    Outpatient Medications Prior to Visit  Medication Sig Dispense Refill  . acetaminophen (TYLENOL) 500 MG tablet Take 1,000 mg by mouth daily as needed for moderate pain or headache.    . Biotin 10000 MCG TABS Take 5,000 mcg by mouth every other day.     . calcium carbonate (OSCAL) 1500 (600 Ca) MG TABS tablet Take 600 mg by mouth daily.    . diclofenac Sodium (VOLTAREN) 1 % GEL Apply 1 g topically 3 (three) times daily as needed for pain.    . Dulaglutide (TRULICITY) 1.5 OX/7.3ZH SOPN Inject 1.5 mg into the skin once a week. 2 mL 0  . fluticasone (FLONASE) 50 MCG/ACT nasal spray Place 2 sprays into both nostrils daily. (Patient taking differently: Place 2  sprays into both nostrils daily. As needed) 16 g 1  . levETIRAcetam (KEPPRA) 250 MG tablet Take 250 mg by mouth at bedtime.    Marland Kitchen levocetirizine (XYZAL) 5 MG tablet Take 1 tablet (5 mg total) by mouth every evening. (Patient taking differently: Take 5 mg by mouth every evening. As needed) 30 tablet 2  . lubiprostone (AMITIZA) 24 MCG capsule Take 1 capsule (24 mcg total) by mouth 2 (two) times daily with a meal. 180 capsule 3  . Multiple Vitamins-Minerals (BARIATRIC MULTIVITAMINS/IRON PO) Take 1 tablet by mouth daily.     . ondansetron (ZOFRAN) 4 MG tablet Take 1 tablet (4 mg total) by mouth every 8 (eight) hours as needed for nausea or vomiting. 12 tablet 0  . pantoprazole (PROTONIX) 40 MG tablet Take 1 tablet (40 mg total) by mouth 2 (two) times daily before a meal. 180 tablet 3  . QUEtiapine (SEROQUEL) 50 MG tablet Take 1 tablet (50 mg total) by mouth at bedtime. 30 tablet 1  . topiramate (TOPAMAX) 100 MG tablet Take 100 mg by mouth 2 (two) times daily.     Marland Kitchen venlafaxine XR (EFFEXOR-XR) 150 MG 24 hr capsule Total of 225 mg along with 75 mg tab 90 capsule 1  . venlafaxine XR (EFFEXOR-XR) 75 MG 24 hr capsule 225 mg daily. Take along with 150 mg cap 90 capsule 0  . Vitamin D, Ergocalciferol, (DRISDOL) 1.25 MG (50000 UNIT) CAPS capsule Take 1 capsule (50,000 Units total) by mouth every 7 (seven) days. 12 capsule 1   No facility-administered medications prior to visit.    No Known Allergies  Review of Systems  Constitutional: Negative for chills and fever.  HENT: Negative for congestion and sore throat.   Respiratory: Negative  for cough and shortness of breath.   Cardiovascular: Negative for chest pain and palpitations.  Gastrointestinal: Negative for constipation, nausea and vomiting.       Objective:    Physical Exam Constitutional:      General: She is not in acute distress.    Appearance: She is not diaphoretic.  HENT:     Mouth/Throat:     Mouth: Mucous membranes are moist.   Eyes:     General: No scleral icterus.    Extraocular Movements: Extraocular movements intact.  Skin:    General: Skin is warm.     Comments: About 1 cm in diameter lipoma over left clavicular border  Neurological:     Mental Status: She is alert.     BP 127/84   Pulse 68   Resp 15   Ht 5\' 2"  (1.575 m)   Wt 203 lb (92.1 kg)   SpO2 97%   BMI 37.13 kg/m  Wt Readings from Last 3 Encounters:  03/25/20 203 lb (92.1 kg)  03/24/20 198 lb (89.8 kg)  03/08/20 197 lb (89.4 kg)    Health Maintenance Due  Topic Date Due  . COVID-19 Vaccine (3 - Booster for Pfizer series) 10/03/2019    There are no preventive care reminders to display for this patient.   Lab Results  Component Value Date   TSH 1.390 09/14/2019   Lab Results  Component Value Date   WBC 5.0 10/22/2019   HGB 13.3 10/22/2019   HCT 40.0 10/22/2019   MCV 92 10/22/2019   PLT 349 10/22/2019   Lab Results  Component Value Date   NA 143 03/24/2020   K 4.5 03/24/2020   CO2 24 03/24/2020   GLUCOSE 78 03/24/2020   BUN 7 03/24/2020   CREATININE 0.80 03/24/2020   BILITOT 0.3 03/24/2020   ALKPHOS 74 03/24/2020   AST 21 03/24/2020   ALT 35 (H) 03/24/2020   PROT 6.3 03/24/2020   ALBUMIN 4.0 03/24/2020   CALCIUM 8.6 (L) 03/24/2020   ANIONGAP 8 10/15/2018   Lab Results  Component Value Date   CHOL 193 03/24/2020   Lab Results  Component Value Date   HDL 64 03/24/2020   Lab Results  Component Value Date   LDLCALC 117 (H) 03/24/2020   Lab Results  Component Value Date   TRIG 66 03/24/2020   Lab Results  Component Value Date   CHOLHDL 3.0 03/24/2020   Lab Results  Component Value Date   HGBA1C 5.1 03/24/2020       Assessment & Plan:   Problem List Items Addressed This Visit   None   Visit Diagnoses    Lipoma of neck    -  Primary Neck lump appears to be related to recurring lipoma Advised to contact if change in size or if she has dysphagia, dysphonia or dyspnea Patient was made aware  that lipoma can be resected for cosmetic purposes, but it has tendency to recur.       No orders of the defined types were placed in this encounter.    Lindell Spar, MD

## 2020-03-28 ENCOUNTER — Institutional Professional Consult (permissible substitution): Payer: 59 | Admitting: Neurology

## 2020-03-28 NOTE — Progress Notes (Signed)
Chief Complaint:   OBESITY Brooke [name] is here to discuss her progress with her obesity treatment plan along with follow-up of her obesity related diagnoses. Brooke [name] is on the [employer] and states she is following her eating plan approximately 75% of the time. Brooke [name] states she is walking 30-45 minutes 3 times per week.  Today's visit was #: 9 Starting weight: 217 lbs Starting date: 09/14/2019 Today's weight: 198 lbs Today's date: 03/24/2020 Total lbs lost to date: 19 lbs Total lbs lost since last in-office visit: 0  Interim History: Since changing from Wegovy to Trulicity (for IR), Brooke [name] reports increase in carb snacking. She has been on Trulicity 4.54 mg once a week for 2 weeks and is tolerating it well, with the exception of brief nausea without vomiting. She is interested in increasing the dose. We will discuss change at 4 week interval.   Subjective:   1. Insulin resistance Brooke [name] was previously on Wegovy but could not afford it. She is now on Trulicity 0.98 mg once weekly and tolerating it well, with the exception of brief nausea without vomiting.  2. Other hyperlipidemia Brooke [name]'s 09/14/2019 lipid panel resulted LDL slightly above goal at 109. She is not on statin therapy.  3. Vitamin D deficiency Brooke [name]'s Vitamin D level was 41.8 on 09/14/2019. She is currently taking prescription vitamin D 50,000 IU each week. She denies nausea, vomiting or muscle weakness.   Ref. Range 09/14/2019 10:14  Vitamin D, 25-Hydroxy Latest Ref Range: 30.0 - 100.0 ng/mL 41.8   4. At risk for nausea Brooke [name] is at risk for nausea due to obesity and recently changing GLP-1 therapy.  Assessment/Plan:   1. Insulin resistance Laurel will continue to work on weight loss, exercise, and decreasing simple carbohydrates to help decrease the risk of diabetes. [place] agreed to follow-up with [place] as directed to closely monitor her progress. Continue 1.19 mg Trulicity for the next 2 weeks, and we will consider  increasing dose at next OV. Check labs today.  - Dulaglutide (TRULICITY) 1.5 JY/7.8GN SOPN; Inject 1.5 mg into the skin once a week.  Dispense: 2 mL; Refill: 0  - Hemoglobin A1c - Insulin, random  2. Other hyperlipidemia Cardiovascular risk and specific lipid/LDL goals reviewed.  We discussed several lifestyle modifications today and Brooke [name] will continue to work on diet, exercise and weight loss efforts. Orders and follow up as documented in patient record. Check labs today.  Counseling Intensive lifestyle modifications are the first line treatment for this issue. . Dietary changes: Increase soluble fiber. Decrease simple carbohydrates. . Exercise changes: Moderate to vigorous-intensity aerobic activity 150 minutes per week if tolerated. . Lipid-lowering medications: see documented in medical record.  - Comprehensive metabolic panel - Lipid panel  3. Vitamin D deficiency Low Vitamin D level contributes to fatigue and are associated with obesity, breast, and colon cancer. She agrees to continue to take prescription Vitamin D @50 ,000 IU every week and will follow-up for routine testing of Vitamin D, at least 2-3 times per year to avoid over-replacement. Low Vitamin D level contributes to fatigue and are associated with obesity, breast, and colon cancer. She agrees to continue to take prescription Vitamin D @50 ,000 IU every week and will follow-up for routine testing of Vitamin D, at least 2-3 times per year to avoid over-replacement.  - VITAMIN D 25 Hydroxy (Vit-D Deficiency, Fractures)  4. At risk for nausea Brooke [name] was given approximately 15 minutes of nausea prevention counseling today. Brooke [name] is at risk for nausea  due to her new or current medication. She was encouraged to titrate her medication slowly, make sure to stay hydrated, eat smaller portions throughout the day, and avoid high fat meals.   5. Current BMI 36.3 Brooke [name] is currently in the action stage of change. As such,  her goal is to continue with weight loss efforts. She has agreed to the [employer].   Exercise goals: As is  Behavioral modification strategies: increasing lean protein intake, keeping healthy foods in the home and planning for success.  Brooke [name] has agreed to follow-up with our clinic in 2 weeks. She was informed of the importance of frequent follow-up visits to maximize her success with intensive lifestyle modifications for her multiple health conditions.   Objective:   Blood pressure 116/78, pulse 80, temperature 98.4 F (36.9 C), height 5\' 2"  (1.575 m), weight 198 lb (89.8 kg), SpO2 100 %. Body mass index is 36.21 kg/m.  General: Cooperative, alert, well developed, in no acute distress. HEENT: Conjunctivae and lids unremarkable. Cardiovascular: Regular rhythm.  Lungs: Normal work of breathing. Neurologic: No focal deficits.   Lab Results  Component Value Date   CREATININE 0.80 03/24/2020   BUN 7 03/24/2020   NA 143 03/24/2020   K 4.5 03/24/2020   CL 104 03/24/2020   CO2 24 03/24/2020   Lab Results  Component Value Date   ALT 35 (H) 03/24/2020   AST 21 03/24/2020   ALKPHOS 74 03/24/2020   BILITOT 0.3 03/24/2020   Lab Results  Component Value Date   HGBA1C 5.1 03/24/2020   HGBA1C 5.2 09/14/2019   HGBA1C 5.3 08/05/2019   Lab Results  Component Value Date   INSULIN 5.1 03/24/2020   INSULIN 5.9 09/14/2019   Lab Results  Component Value Date   TSH 1.390 09/14/2019   Lab Results  Component Value Date   CHOL 193 03/24/2020   HDL 64 03/24/2020   LDLCALC 117 (H) 03/24/2020   TRIG 66 03/24/2020   CHOLHDL 3.0 03/24/2020   Lab Results  Component Value Date   WBC 5.0 10/22/2019   HGB 13.3 10/22/2019   HCT 40.0 10/22/2019   MCV 92 10/22/2019   PLT 349 10/22/2019   Lab Results  Component Value Date   IRON 111 09/03/2019   TIBC 292 09/03/2019   FERRITIN 187 09/03/2019    Attestation Statements:   Reviewed by clinician on day of visit: allergies,  medications, problem list, medical history, surgical history, family history, social history, and previous encounter notes.  Coral Ceo, am acting as Location manager for Mina Marble, NP.  I have reviewed the above documentation for accuracy and completeness, and I agree with the above. -  Brooke Rahrig d. Frances Joynt, NP-C
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
## 2020-04-02 ENCOUNTER — Other Ambulatory Visit (INDEPENDENT_AMBULATORY_CARE_PROVIDER_SITE_OTHER): Payer: Self-pay | Admitting: Family Medicine

## 2020-04-02 DIAGNOSIS — E8881 Metabolic syndrome: Secondary | ICD-10-CM

## 2020-04-04 ENCOUNTER — Ambulatory Visit: Payer: 59 | Admitting: Gastroenterology

## 2020-04-04 ENCOUNTER — Encounter: Payer: Self-pay | Admitting: Internal Medicine

## 2020-04-04 NOTE — Progress Notes (Deleted)
Primary Care Physician: Perlie Mayo, NP  Primary Gastroenterologist:  Garfield Cornea, MD   No chief complaint on file.   HPI: Brooke [name] is a 50 y.o. female here for follow-up.  Patient last seen in January 2022.  She has a history of abdominal pain, constipation, GERD, abnormal LFTs with work-up being negative except for mildly elevated anti-smooth muscle antibody (mildly elevated transaminases felt to be due to fatty liver), dysphagia.   EGD June 2021 status post empiric dilation due to dysphagia, noted evidence of prior gastric bypass surgery.  Colonoscopy up-to-date 2014, recommended repeat in 2024.  Abdominal ultrasound completed August 2021: Normal gallbladder, hepatic steatosis but no signs of cirrhosis.  Spleen normal in size.  HIDA November 2021, normal with gallbladder EF 61%.  Note [place], MiraLAX, Amitiza.  Current Outpatient Medications  Medication Sig Dispense Refill  . acetaminophen (TYLENOL) 500 MG tablet Take 1,000 mg by mouth daily as needed for moderate pain or headache.    . Biotin 10000 MCG TABS Take 5,000 mcg by mouth every other day.     . calcium carbonate (OSCAL) 1500 (600 Ca) MG TABS tablet Take 600 mg by mouth daily.    . diclofenac Sodium (VOLTAREN) 1 % GEL Apply 1 g topically 3 (three) times daily as needed for pain.    . Dulaglutide (TRULICITY) 1.5 TD/9.7CB SOPN Inject 1.5 mg into the skin once a week. 2 mL 0  . fluticasone (FLONASE) 50 MCG/ACT nasal spray Place 2 sprays into both nostrils daily. (Patient taking differently: Place 2 sprays into both nostrils daily. As needed) 16 g 1  . levETIRAcetam (KEPPRA) 250 MG tablet Take 250 mg by mouth at bedtime.    Marland Kitchen levocetirizine (XYZAL) 5 MG tablet Take 1 tablet (5 mg total) by mouth every evening. (Patient taking differently: Take 5 mg by mouth every evening. As needed) 30 tablet 2  . lubiprostone (AMITIZA) 24 MCG capsule Take 1 capsule (24 mcg total) by mouth 2 (two) times daily with a  meal. 180 capsule 3  . Multiple Vitamins-Minerals (BARIATRIC MULTIVITAMINS/IRON PO) Take 1 tablet by mouth daily.     . ondansetron (ZOFRAN) 4 MG tablet Take 1 tablet (4 mg total) by mouth every 8 (eight) hours as needed for nausea or vomiting. 12 tablet 0  . pantoprazole (PROTONIX) 40 MG tablet Take 1 tablet (40 mg total) by mouth 2 (two) times daily before a meal. 180 tablet 3  . QUEtiapine (SEROQUEL) 50 MG tablet Take 1 tablet (50 mg total) by mouth at bedtime. 30 tablet 1  . Semaglutide-Weight Management 1 MG/0.5ML SOAJ INJECT 1 MG INTO THE SKIN ONCE A WEEK. 2 mL 0  . topiramate (TOPAMAX) 100 MG tablet Take 100 mg by mouth 2 (two) times daily.     Marland Kitchen venlafaxine XR (EFFEXOR-XR) 150 MG 24 hr capsule Total of 225 mg along with 75 mg tab 90 capsule 1  . venlafaxine XR (EFFEXOR-XR) 75 MG 24 hr capsule 225 mg daily. Take along with 150 mg cap 90 capsule 0  . Vitamin D, Ergocalciferol, (DRISDOL) 1.25 MG (50000 UNIT) CAPS capsule Take 1 capsule (50,000 Units total) by mouth every 7 (seven) days. 12 capsule 1   No current facility-administered medications for this visit.    Allergies as of 04/04/2020  . (Not on File)    ROS:  General: Negative for anorexia, weight loss, fever, chills, fatigue, weakness. ENT: Negative for hoarseness, difficulty swallowing , nasal congestion. CV: Negative for chest  pain, angina, palpitations, dyspnea on exertion, peripheral edema.  Respiratory: Negative for dyspnea at rest, dyspnea on exertion, cough, sputum, wheezing.  GI: See history of present illness. GU:  Negative for dysuria, hematuria, urinary incontinence, urinary frequency, nocturnal urination.  Endo: Negative for unusual weight change.    Physical Examination:   There were no vitals taken for this visit.  General: Well-nourished, well-developed in no acute distress.  Eyes: No icterus. Mouth: Oropharyngeal mucosa moist and pink , no lesions erythema or exudate. Lungs: Clear to auscultation  bilaterally.  Heart: Regular rate and rhythm, no murmurs rubs or gallops.  Abdomen: Bowel sounds are normal, nontender, nondistended, no hepatosplenomegaly or masses, no abdominal bruits or hernia , no rebound or guarding.   Extremities: No lower extremity edema. No clubbing or deformities. Neuro: Alert and oriented x 4   Skin: Warm and dry, no jaundice.   Psych: Alert and cooperative, normal mood and affect.  Labs:  Lab Results  Component Value Date   CREATININE 0.80 03/24/2020   BUN 7 03/24/2020   NA 143 03/24/2020   K 4.5 03/24/2020   CL 104 03/24/2020   CO2 24 03/24/2020   Lab Results  Component Value Date   ALT 35 (H) 03/24/2020   AST 21 03/24/2020   ALKPHOS 74 03/24/2020   BILITOT 0.3 03/24/2020   Lab Results  Component Value Date   WBC 5.0 10/22/2019   HGB 13.3 10/22/2019   HCT 40.0 10/22/2019   MCV 92 10/22/2019   PLT 349 10/22/2019     Imaging Studies: No results found.

## 2020-04-05 ENCOUNTER — Other Ambulatory Visit: Payer: Self-pay

## 2020-04-05 ENCOUNTER — Telehealth (INDEPENDENT_AMBULATORY_CARE_PROVIDER_SITE_OTHER): Payer: 59 | Admitting: Psychiatry

## 2020-04-05 ENCOUNTER — Encounter: Payer: Self-pay | Admitting: Psychiatry

## 2020-04-05 DIAGNOSIS — F410 Panic disorder [episodic paroxysmal anxiety] without agoraphobia: Secondary | ICD-10-CM

## 2020-04-05 DIAGNOSIS — F330 Major depressive disorder, recurrent, mild: Secondary | ICD-10-CM | POA: Diagnosis not present

## 2020-04-05 MED ORDER — VENLAFAXINE HCL ER 75 MG PO CP24: ORAL_CAPSULE | ORAL | 1 refills | Status: DC

## 2020-04-05 MED ORDER — QUETIAPINE FUMARATE 25 MG PO TABS: 25.0000 mg | ORAL_TABLET | Freq: Two times a day (BID) | ORAL | 1 refills | Status: DC

## 2020-04-05 NOTE — Patient Instructions (Addendum)
1.Continuevenlafaxine225mg at night 2 Split the dose of quetiapine - 25 mg twice a day. Cut down to 25 mg at night if no improvement in drowsiness.  4.Next appointment: 5/23 at 9:20, in person visit  The next visit will be in person visit. Please arrive 15 mins before the scheduled time.   [facility]  Address: [address], [place], [place] [address]

## 2020-04-14 ENCOUNTER — Ambulatory Visit (INDEPENDENT_AMBULATORY_CARE_PROVIDER_SITE_OTHER): Payer: Self-pay | Admitting: Bariatrics

## 2020-04-14 ENCOUNTER — Ambulatory Visit (INDEPENDENT_AMBULATORY_CARE_PROVIDER_SITE_OTHER): Payer: 59 | Admitting: Adult Health

## 2020-04-14 ENCOUNTER — Other Ambulatory Visit: Payer: Self-pay

## 2020-04-14 ENCOUNTER — Encounter (INDEPENDENT_AMBULATORY_CARE_PROVIDER_SITE_OTHER): Payer: Self-pay | Admitting: Bariatrics

## 2020-04-14 VITALS — BP 105/72 | HR 88 | Temp 98.0°F | Ht 62.0 in | Wt 200.0 lb

## 2020-04-14 DIAGNOSIS — E8881 Metabolic syndrome: Secondary | ICD-10-CM

## 2020-04-14 DIAGNOSIS — E7849 Other hyperlipidemia: Secondary | ICD-10-CM

## 2020-04-14 DIAGNOSIS — Z6839 Body mass index (BMI) 39.0-39.9, adult: Secondary | ICD-10-CM

## 2020-04-14 MED ORDER — TRULICITY 3 MG/0.5ML ~~LOC~~ SOAJ: 3.0000 mg | SUBCUTANEOUS | 0 refills | Status: DC

## 2020-04-18 ENCOUNTER — Telehealth (INDEPENDENT_AMBULATORY_CARE_PROVIDER_SITE_OTHER): Payer: Self-pay | Admitting: Emergency Medicine

## 2020-04-18 NOTE — Telephone Encounter (Signed)
Prior Auth: Initiated: [employer]  Contact plan to follow up on [phone]

## 2020-04-19 ENCOUNTER — Encounter (INDEPENDENT_AMBULATORY_CARE_PROVIDER_SITE_OTHER): Payer: Self-pay | Admitting: Bariatrics

## 2020-04-19 NOTE — Progress Notes (Signed)
Chief Complaint:   OBESITY Brooke [name] is here to discuss her progress with her obesity treatment plan along with follow-up of her obesity related diagnoses. Brooke [name] is on the [employer] and states she is following her eating plan approximately 75% of the time. Brooke [name] states she is walking for 30 minutes 3 times per week.  Today's visit was #: 10 Starting weight: 217 lbs Starting date: 09/14/2019 Today's weight: 200 lbs Today's date: 04/14/2020 Total lbs lost to date: 17 lbs Total lbs lost since last in-office visit: 0  Interim History: Brooke [name] is down 2 pounds and has done well overall.  Subjective:   1. Insulin resistance Brooke [name] has a diagnosis of insulin resistance based on her elevated fasting insulin level >5. She continues to work on diet and exercise to decrease her risk of diabetes.    Lab Results  Component Value Date   INSULIN 5.1 03/24/2020   INSULIN 5.9 09/14/2019   Lab Results  Component Value Date   HGBA1C 5.1 03/24/2020   2. Other hyperlipidemia Brooke [name] has hyperlipidemia and has been trying to improve her cholesterol levels with intensive lifestyle modification including a low saturated fat diet, exercise and weight loss. She denies any chest pain, claudication or myalgias.  She is on no medications for this.  Lab Results  Component Value Date   ALT 35 (H) 03/24/2020   AST 21 03/24/2020   ALKPHOS 74 03/24/2020   BILITOT 0.3 03/24/2020   Lab Results  Component Value Date   CHOL 193 03/24/2020   HDL 64 03/24/2020   LDLCALC 117 (H) 03/24/2020   TRIG 66 03/24/2020   CHOLHDL 3.0 03/24/2020   Assessment/Plan:   1. Insulin resistance Brooke [name] will continue to work on weight loss, exercise, and decreasing simple carbohydrates to help decrease the risk of diabetes. Brooke [name] agreed to follow-up with [place] as directed to closely monitor her progress.  Continue Trulicity, as per below.  - Refill Dulaglutide (TRULICITY) 3 IH/0.3UU SOPN; Inject 3 mg as directed once  a week.  Dispense: 2 mL; Refill: 0  2. Other hyperlipidemia Cardiovascular risk and specific lipid/LDL goals reviewed.  We discussed several lifestyle modifications today and Brooke [name] will continue to work on diet, exercise and weight loss efforts. Orders and follow up as documented in patient record.  Continue to work on diet and exercise.  Counseling Intensive lifestyle modifications are the first line treatment for this issue. . Dietary changes: Increase soluble fiber. Decrease simple carbohydrates. . Exercise changes: Moderate to vigorous-intensity aerobic activity 150 minutes per week if tolerated. . Lipid-lowering medications: see documented in medical record.  3. Obesity with current BMI 36.6  Brooke [name] is currently in the action stage of change. As such, her goal is to continue with weight loss efforts. She has agreed to the [employer].   She will work on meal planning, intentional eating, and increasing protein intake.  Labs from 03/24/2020, including CMP, lipid panel, vitamin D, A1c, insulin, and vitamin D were reviewed today.  Exercise goals: For substantial health benefits, adults should do at least 150 minutes (2 hours and 30 minutes) a week of moderate-intensity, or 75 minutes (1 hour and 15 minutes) a week of vigorous-intensity aerobic physical activity, or an equivalent combination of moderate- and vigorous-intensity aerobic activity. Aerobic activity should be performed in episodes of at least 10 minutes, and preferably, it should be spread throughout the week.  Behavioral modification strategies: increasing lean protein intake, decreasing simple carbohydrates, increasing vegetables, increasing water intake, decreasing eating  out, no skipping meals, meal planning and cooking strategies, keeping healthy foods in the home and planning for success.  [place] has agreed to follow-up with our clinic in 2 weeks with Brooke Marble, NP. She was informed of the importance of frequent  follow-up visits to maximize her success with intensive lifestyle modifications for her multiple health conditions.   Objective:   Blood pressure 105/72, pulse 88, temperature 98 F (36.7 C), height 5\' 2"  (1.575 m), weight 200 lb (90.7 kg), SpO2 98 %. Body mass index is 36.58 kg/m.  General: Cooperative, alert, well developed, in no acute distress. HEENT: Conjunctivae and lids unremarkable. Cardiovascular: Regular rhythm.  Lungs: Normal work of breathing. Neurologic: No focal deficits.   Lab Results  Component Value Date   CREATININE 0.80 03/24/2020   BUN 7 03/24/2020   NA 143 03/24/2020   K 4.5 03/24/2020   CL 104 03/24/2020   CO2 24 03/24/2020   Lab Results  Component Value Date   ALT 35 (H) 03/24/2020   AST 21 03/24/2020   ALKPHOS 74 03/24/2020   BILITOT 0.3 03/24/2020   Lab Results  Component Value Date   HGBA1C 5.1 03/24/2020   HGBA1C 5.2 09/14/2019   HGBA1C 5.3 08/05/2019   Lab Results  Component Value Date   INSULIN 5.1 03/24/2020   INSULIN 5.9 09/14/2019   Lab Results  Component Value Date   TSH 1.390 09/14/2019   Lab Results  Component Value Date   CHOL 193 03/24/2020   HDL 64 03/24/2020   LDLCALC 117 (H) 03/24/2020   TRIG 66 03/24/2020   CHOLHDL 3.0 03/24/2020   Lab Results  Component Value Date   WBC 5.0 10/22/2019   HGB 13.3 10/22/2019   HCT 40.0 10/22/2019   MCV 92 10/22/2019   PLT 349 10/22/2019   Lab Results  Component Value Date   IRON 111 09/03/2019   TIBC 292 09/03/2019   FERRITIN 187 09/03/2019   Attestation Statements:   Reviewed by clinician on day of visit: allergies, medications, problem list, medical history, surgical history, family history, social history, and previous encounter notes.  I, Water quality scientist, CMA, am acting as Location manager for [employer], DO  I have reviewed the above documentation for accuracy and completeness, and I agree with the above. Jearld Lesch, DO

## 2020-04-22 ENCOUNTER — Encounter (HOSPITAL_COMMUNITY): Payer: Self-pay

## 2020-04-22 ENCOUNTER — Emergency Department (HOSPITAL_COMMUNITY)
Admission: EM | Admit: 2020-04-22 | Discharge: 2020-04-22 | Disposition: A | Discharge status: Home / Self Care | Payer: 59 | Attending: Emergency Medicine | Admitting: Emergency Medicine

## 2020-04-22 ENCOUNTER — Telehealth: Payer: Self-pay

## 2020-04-22 ENCOUNTER — Ambulatory Visit: Payer: 59 | Admitting: Adult Health

## 2020-04-22 ENCOUNTER — Other Ambulatory Visit: Payer: Self-pay

## 2020-04-22 DIAGNOSIS — Z87891 Personal history of nicotine dependence: Secondary | ICD-10-CM | POA: Diagnosis not present

## 2020-04-22 DIAGNOSIS — I10 Essential (primary) hypertension: Secondary | ICD-10-CM | POA: Diagnosis not present

## 2020-04-22 DIAGNOSIS — N939 Abnormal uterine and vaginal bleeding, unspecified: Secondary | ICD-10-CM | POA: Diagnosis present

## 2020-04-22 DIAGNOSIS — N938 Other specified abnormal uterine and vaginal bleeding: Secondary | ICD-10-CM | POA: Diagnosis not present

## 2020-04-22 LAB — BASIC METABOLIC PANEL
Anion gap: 9 (ref 5–15)
BUN: 10 mg/dL (ref 6–20)
CO2: 21 mmol/L — ABNORMAL LOW (ref 22–32)
Calcium: 8.5 mg/dL — ABNORMAL LOW (ref 8.9–10.3)
Chloride: 110 mmol/L (ref 98–111)
Creatinine, Ser: 0.8 mg/dL (ref 0.44–1.00)
GFR, Estimated: >60 mL/min (ref >60)
Glucose, Bld: 80 mg/dL (ref 70–99)
Potassium: 3.6 mmol/L (ref 3.5–5.1)
Sodium: 140 mmol/L (ref 135–145)

## 2020-04-22 LAB — URINALYSIS, ROUTINE W REFLEX MICROSCOPIC
Bilirubin Urine: NEGATIVE
Glucose, UA: NEGATIVE mg/dL
Ketones, ur: NEGATIVE mg/dL
Nitrite: NEGATIVE
Protein, ur: NEGATIVE mg/dL
Specific Gravity, Urine: 1.02 (ref 1.005–1.030)
pH: 5 (ref 5.0–8.0)

## 2020-04-22 LAB — CBC WITH DIFFERENTIAL/PLATELET
Abs Immature Granulocytes: 0 10*3/uL (ref 0.00–0.07)
Basophils Absolute: 0 10*3/uL (ref 0.0–0.1)
Basophils Relative: 0 %
Eosinophils Absolute: 0.1 10*3/uL (ref 0.0–0.5)
Eosinophils Relative: 1 %
HCT: 43 % (ref 36.0–46.0)
Hemoglobin: 13.9 g/dL (ref 12.0–15.0)
Immature Granulocytes: 0 %
Lymphocytes Relative: 46 %
Lymphs Abs: 2.2 10*3/uL (ref 0.7–4.0)
MCH: 31.7 pg (ref 26.0–34.0)
MCHC: 32.3 g/dL (ref 30.0–36.0)
MCV: 98.2 fL (ref 80.0–100.0)
Monocytes Absolute: 0.3 10*3/uL (ref 0.1–1.0)
Monocytes Relative: 6 %
Neutro Abs: 2.2 10*3/uL (ref 1.7–7.7)
Neutrophils Relative %: 47 %
Platelets: 357 10*3/uL (ref 150–400)
RBC: 4.38 MIL/uL (ref 3.87–5.11)
RDW: 15.2 % (ref 11.5–15.5)
WBC: 4.8 10*3/uL (ref 4.0–10.5)
nRBC: 0 % (ref 0.0–0.2)

## 2020-04-22 LAB — PROTIME-INR
INR: 1 (ref 0.8–1.2)
Prothrombin Time: 12.7 seconds (ref 11.4–15.2)

## 2020-04-22 LAB — WET PREP, GENITAL
Clue Cells Wet Prep HPF POC: NONE SEEN
Sperm: NONE SEEN
Trich, Wet Prep: NONE SEEN
Yeast Wet Prep HPF POC: NONE SEEN

## 2020-04-22 LAB — PREGNANCY, URINE: Preg Test, Ur: NEGATIVE

## 2020-04-22 MED ORDER — SODIUM CHLORIDE 0.9 % IV BOLUS
1000.0000 mL | Freq: Once | INTRAVENOUS | Status: AC
  Administered 2020-04-22: 1000 mL via INTRAVENOUS

## 2020-04-22 NOTE — Telephone Encounter (Signed)
FYI. Pt has been bleeding for 20 days which is not normal for her usual cycle. For a week now she has had nausea and vomiting and stomach cramps that come and go. She says she does not feel well. She had a GYN appt today but they had to reschedule her because her provider left sick. Instructed her to go to the ER for further work up. Pt agreed.

## 2020-04-22 NOTE — ED Provider Notes (Signed)
[facility] EMERGENCY DEPARTMENT Provider Note   CSN: [id number] Arrival date & time: 04/22/20  1056     History Chief Complaint  Patient presents with  . Vaginal Bleeding    Brooke [name] is a 50 y.o. female.  Pt presents to the ED today with vaginal bleeding.  She said she's had bleeding for 20 days.  She said she feels extremely tired and weak.  She said she has passed large clots.  She had an appt today with her obgyn, but that provider called out sick, so her appt was canceled.  So, she came here.  She said her bleeding has slowed down.  She did have some abdominal cramping, but none now.  Before this 20 day period, she has had nl periods.        Past Medical History:  Diagnosis Date  . Acid reflux   . Amenorrhea 02/06/2012  . Anxiety   . Arthritis    [name] 10/13/2019  . B12 deficiency   . Breast lump 08/06/2019  . Cervical radiculitis   . Chest pain   . Chronic constipation   . DDD (degenerative disc disease), lumbar   . Depression   . Depression    [name] 10/13/2019  . Dizzy spells   . Elevated vitamin B12 level 05/04/2019  . Esophageal dysphagia 11/19/2012  . Facial numbness   . Fatty liver   . GERD (gastroesophageal reflux disease)    [name] 10/13/2019  . Headache(784.0) 04/01/2012  . Heart palpitations 01/2017  . High cholesterol   . History of anemia   . History of hiatal hernia   . Hypertension   . Insomnia   . Intractable migraine with visual aura and without status migrainosus 01/23/2017  . Iron deficiency anemia   . Irregular periods 08/06/2019  . Joint pain   . Lactose intolerance   . LUQ pain 11/19/2012  . Menopausal symptom 08/06/2019  . Migraines    occ  . Near syncope 01/2017  . Numbness and tingling 10/08/2016   Formatting of this note might be different from the original. ---Oct 2018-TEE----Normal left ventricular size and systolic function with no appreciable segmental abnormality. EF 60% There was no evidence of spontaneous echo  contrast or thrombus in the left atrium or left atrial appendage. No significant valvular abnormalites noted Bubble study performed, this is negative.  . Numbness and tingling in left arm   . Obesity   . Other malaise and fatigue 05/19/2012  . Panic attacks   . Sciatica   . Seizures ([place])   . Slurred speech 11/07/2016   Formatting of this note might be different from the original. ---Oct 2018-TEE----Normal left ventricular size and systolic function with no appreciable segmental abnormality. EF 60% There was no evidence of spontaneous echo contrast or thrombus in the left atrium or left atrial appendage. No significant valvular abnormalites noted Bubble study performed, this is negative.  . Small bowel obstruction ([place]) 07/20/2017  . Spells of speech arrest 01/23/2017  . Transient cerebral ischemia 10/08/2016   Formatting of this note might be different from the original. ---Oct 2018-TEE----Normal left ventricular size and systolic function with no appreciable segmental abnormality. EF 60% There was no evidence of spontaneous echo contrast or thrombus in the left atrium or left atrial appendage. No significant valvular abnormalites noted Bubble study performed, this is negative.  . Vitamin D deficiency   . Word finding difficulty 01/23/2017    Patient Active Problem List   Diagnosis Date Noted  .  Other hyperlipidemia 03/24/2020  . Insulin resistance 03/08/2020  . Anxiety 03/08/2020  . Left shoulder pain 02/22/2020  . Immunization due 02/22/2020  . Dysphagia 01/28/2020  . Bloating 01/28/2020  . Nausea without vomiting 01/28/2020  . Abdominal pain 12/02/2019  . Raynaud's phenomenon without gangrene 10/22/2019  . Fatigue 10/22/2019  . Need for immunization against influenza 10/22/2019  . Family history of systemic lupus erythematosus 10/22/2019  . Environmental and seasonal allergies 10/14/2019  . Cough 10/14/2019  . Generalized anxiety disorder with panic attacks 08/20/2019  . Arthritis  08/06/2019  . Back problem 08/06/2019  . Severe carpal tunnel syndrome of both wrists 08/06/2019  . Acute cystitis without hematuria 08/06/2019  . Elevated liver enzymes 08/06/2019  . Vitamin D deficiency 05/04/2019  . Preventative health care 02/05/2019  . Mixed hyperglyceridemia 01/23/2019  . Precordial pain 01/23/2019  . Mixed obsessional thoughts and acts 12/12/2018  . Seizure disorder ([place]) 10/13/2018  . 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 Hypersomnia due to another medical condition 06/12/2017  . Class 2 severe obesity with serious comorbidity and body mass index (BMI) of 39.0 to 39.9 in adult ([place]) 01/23/2017  . Sleep deprivation 01/23/2017  . Vertigo 10/08/2016  . Blurred vision 10/08/2016  . Chronic migraine 10/08/2016  . Seizures ([place]) 09/25/2016  . Morbid obesity ([place]) 12/03/2012  . Depression, major, single episode, moderate ([place]) 02/06/2012  . Panic attacks 02/06/2012  . NICOTINE ADDICTION 03/04/2009  . GERD 03/04/2009  . Constipation 03/04/2009    Past Surgical History:  Procedure Laterality Date  . BUNIONECTOMY Left yrs ago  . CERVICAL ABLATION  2017  . COLONOSCOPY, ESOPHAGOGASTRODUODENOSCOPY (EGD) AND ESOPHAGEAL DILATION N/A 12/03/2012   VEL:FYBOFBPZ melanosis throughout the entire examined colon/The colon IS redundant/Small internal hemorrhoids/EGD:Esophageal web/Medium sized hiatal hernia/MILD Non-erosive gastritis  . ESOPHAGOGASTRODUODENOSCOPY  03/09/09   Dr. Wilford Corner, normal EGD, s/p Bravo capsule placement  . ESOPHAGOGASTRODUODENOSCOPY N/A 06/24/2019   rourk: Status post gastric bypass procedure, normal esophagus status post dilation  . EYE SURGERY N/A    [name] 10/13/2019  . GASTRIC ROUX-EN-Y N/A 07/16/2017   Procedure: LAPAROSCOPIC ROUX-EN-Y GASTRIC BYPASS WITH UPPER ENDOSCOPY AND ERAS PATHWAY;  Surgeon: Johnathan Hausen, MD;  Location: [facility]S;  Service: General;  Laterality: N/A;  . LAPAROSCOPY N/A 07/20/2017   Procedure:  LAPAROSCOPY DIAGNOSTIC. REDUCTION OF SMALL BOWEL OBSTRUCTION. REPAIR OF TROCAR HERNIA.;  Surgeon: Alphonsa Overall, MD;  Location: [facility];  Service: General;  Laterality: N/A;  Venia Minks DILATION N/A 06/24/2019   Procedure: Keturah Shavers;  Surgeon: Daneil Dolin, MD;  Location: [facility] SUITE;  Service: Endoscopy;  Laterality: N/A;  . TUBAL LIGATION    . WISDOM TOOTH EXTRACTION       OB History    Gravida  3   Para      Term      Preterm      AB  1   Living  2     SAB      IAB      Ectopic      Multiple      Live Births  2           Family History  Problem Relation Age of Onset  . Diabetes Mother   . Hypertension Mother   . Drug abuse Mother   . Anxiety disorder Mother   . Depression Mother   . CAD Mother        CABG in 61s  . Lung cancer Mother   .  Hyperlipidemia Mother   . Heart disease Mother   . Cancer Mother   . Obesity Mother   . Hypertension Father   . Sudden death Father   . Diabetes Sister   . Hypertension Sister   . Hypertension Brother   . Drug abuse Brother   . CAD Brother        s/p CABG in 45s  . Diabetes Paternal Grandmother   . Hypertension Brother   . Drug abuse Brother   . CAD Brother        "HEart artery blockages" in 51s  . Hypertension Brother   . Drug abuse Brother   . Anxiety disorder Maternal Grandmother   . Depression Maternal Grandmother   . Breast cancer Maternal Grandmother        breast  . Asthma Other   . Heart disease Other   . Colon cancer Neg Hx   . Gastric cancer Neg Hx   . Esophageal cancer Neg Hx     Social History   Tobacco Use  . Smoking status: Former Smoker    Packs/day: 0.30    Years: 23.00    Pack years: 6.90    Types: Cigarettes    Quit date: 10/04/2012    Years since quitting: 7.5  . Smokeless tobacco: Never Used  . Tobacco comment: less than 1/2 pack cigarettes daily  Vaping Use  . Vaping Use: Never used  Substance Use Topics  . Alcohol use: Yes    Comment: weekends; hardly/social   . Drug use: No    Home Medications Prior to Admission medications   Medication Sig Start Date End Date Taking? Authorizing Provider  acetaminophen (TYLENOL) 500 MG tablet Take 1,000 mg by mouth daily as needed for moderate pain or headache.   Yes [provider]  Biotin 10000 MCG TABS Take 5,000 mcg by mouth every other day.    Yes [provider]  calcium carbonate (OSCAL) 1500 (600 Ca) MG TABS tablet Take 600 mg by mouth daily.   Yes [provider]  diclofenac Sodium (VOLTAREN) 1 % GEL Apply 1 g topically 3 (three) times daily as needed for pain. 06/06/19  Yes [provider]  Dulaglutide (TRULICITY) 3 0000000 SOPN Inject 3 mg as directed once a week. 04/14/20  Yes Jearld Lesch A, DO  fluticasone (FLONASE) 50 MCG/ACT nasal spray Place 2 sprays into both nostrils daily. Patient taking differently: Place 2 sprays into both nostrils daily. As needed 10/14/19  Yes Perlie Mayo, NP  levETIRAcetam (KEPPRA) 250 MG tablet Take 250 mg by mouth at bedtime.   Yes [provider]  levocetirizine (XYZAL) 5 MG tablet Take 1 tablet (5 mg total) by mouth every evening. Patient taking differently: Take 5 mg by mouth daily as needed for allergies. 10/14/19  Yes Perlie Mayo, NP  Multiple Vitamins-Minerals (BARIATRIC MULTIVITAMINS/IRON PO) Take 1 tablet by mouth daily.    Yes [provider]  ondansetron (ZOFRAN) 4 MG tablet Take 1 tablet (4 mg total) by mouth every 8 (eight) hours as needed for nausea or vomiting. 11/14/19  Yes Marcello Fennel, PA-C  pantoprazole (PROTONIX) 40 MG tablet Take 1 tablet (40 mg total) by mouth 2 (two) times daily before a meal. 08/24/19  Yes [name] Menghini, PA-C  QUEtiapine (SEROQUEL) 25 MG tablet Take 1 tablet (25 mg total) by mouth 2 (two) times daily. Patient taking differently: Take 25 mg by mouth at bedtime. 04/05/20 06/04/20 Yes [name], MD  topiramate (TOPAMAX)  100 MG tablet Take 100 mg by mouth 2 (two) times  daily.    Yes [provider]  venlafaxine XR (EFFEXOR-XR) 150 MG 24 hr capsule Total of 225 mg along with 75 mg tab Patient taking differently: Take 150 mg by mouth See admin instructions. Total of 225 mg along with 75 mg tab daily 03/21/20  Yes Hisada, Elie Goody, MD  venlafaxine XR (EFFEXOR-XR) 75 MG 24 hr capsule 225 mg daily. Take along with 150 mg cap Patient taking differently: Take 75 mg by mouth See admin instructions. 225 mg daily. Take along with 150 mg cap 05/09/20  Yes Hisada, Reina, MD  Vitamin D, Ergocalciferol, (DRISDOL) 1.25 MG (50000 UNIT) CAPS capsule Take 1 capsule (50,000 Units total) by mouth every 7 (seven) days. 01/12/20  Yes Laqueta Linden, MD  lubiprostone (AMITIZA) 24 MCG capsule Take 1 capsule (24 mcg total) by mouth 2 (two) times daily with a meal. Patient not taking: No sig reported 08/24/19   [name] Menghini, PA-C    Allergies    Patient has no known allergies.  Review of Systems   Review of Systems  Gastrointestinal: Positive for abdominal pain.  Genitourinary: Positive for vaginal bleeding.  All other systems reviewed and are negative.   Physical Exam Updated Vital Signs BP (!) 143/98 (BP Location: Right Arm)   Pulse 82   Temp 98.2 F (36.8 C) (Oral)   Resp 16   Ht 5\' 2"  (1.575 m)   Wt 89.8 kg   LMP 04/02/2020   SpO2 100%   BMI 36.21 kg/m   Physical Exam Vitals and nursing note reviewed.  Constitutional:      Appearance: Normal appearance.  HENT:     Head: Normocephalic and atraumatic.     Right Ear: External ear normal.     Left Ear: External ear normal.     Nose: Nose normal.     Mouth/Throat:     Mouth: Mucous membranes are moist.     Pharynx: Oropharynx is clear.  Eyes:     Extraocular Movements: Extraocular movements intact.     Conjunctiva/sclera: Conjunctivae normal.     Pupils: Pupils are equal, round, and reactive to light.  Cardiovascular:     Rate and Rhythm: Normal rate and regular rhythm.     Pulses: Normal pulses.      Heart sounds: Normal heart sounds.  Pulmonary:     Effort: Pulmonary effort is normal.     Breath sounds: Normal breath sounds.  Abdominal:     General: Abdomen is flat. Bowel sounds are normal.     Palpations: Abdomen is soft.  Genitourinary:    Exam position: Lithotomy position.     Comments: Small amt of blood on swab.   Musculoskeletal:        General: Normal range of motion.     Cervical back: Normal range of motion and neck supple.  Skin:    General: Skin is warm.     Capillary Refill: Capillary refill takes less than 2 seconds.  Neurological:     General: No focal deficit present.     Mental Status: She is alert and oriented to person, place, and time.  Psychiatric:        Mood and Affect: Mood normal.        Behavior: Behavior normal.     ED Results / Procedures / Treatments   Labs (all labs ordered are listed, but only abnormal results are displayed) Labs Reviewed  WET PREP, GENITAL -  Abnormal; Notable for the following components:      Result Value   WBC, Wet Prep HPF POC RARE (*)    All other components within normal limits  BASIC METABOLIC PANEL - Abnormal; Notable for the following components:   CO2 21 (*)    Calcium 8.5 (*)    All other components within normal limits  URINALYSIS, ROUTINE W REFLEX MICROSCOPIC - Abnormal; Notable for the following components:   APPearance CLOUDY (*)    Hgb urine dipstick MODERATE (*)    Leukocytes,Ua LARGE (*)    Bacteria, UA RARE (*)    All other components within normal limits  CBC WITH DIFFERENTIAL/PLATELET  PROTIME-INR  PREGNANCY, URINE  I-STAT BETA HCG BLOOD, ED ([facility], [facility], [facility] ONLY)  GC/CHLAMYDIA PROBE AMP ([place]) NOT AT [facility]    EKG None  Radiology No results found.  Procedures Procedures   Medications Ordered in ED Medications  sodium chloride 0.9 % bolus 1,000 mL (0 mLs Intravenous Stopped 04/22/20 1318)    ED Course  I have reviewed the triage vital signs and the nursing notes.  Pertinent  labs & imaging results that were available during my care of the patient were reviewed by me and considered in my medical decision making (see chart for details).    MDM Rules/Calculators/A&P                          H/h normal.  Pt d/w Dr. Elonda Husky to make sure he did not want to do anything now as her bleeding has mostly stopped.  She is to f/u with gyn.  Return if worse.   Final Clinical Impression(s) / ED Diagnoses Final diagnoses:  Dysfunctional uterine bleeding    Rx / DC Orders ED Discharge Orders    None       Isla Pence, MD 04/22/20 1355

## 2020-04-22 NOTE — Telephone Encounter (Signed)
Appropriate.  Thanks.

## 2020-04-22 NOTE — ED Triage Notes (Signed)
Pt presents to ED with complaints of vaginal bleeding x 20 days, fatigue, nausea, decreased appetite. Pt states she had heavy clots, bright red bleeding x 2 weeks, now just vaginal spotting.

## 2020-04-22 NOTE — Telephone Encounter (Signed)
Pt is calling to talk to a nurse regarding irregular bleeding

## 2020-04-25 LAB — GC/CHLAMYDIA PROBE AMP (~~LOC~~) NOT AT ARMC
Chlamydia: NEGATIVE
Comment: NEGATIVE
Comment: NORMAL
Neisseria Gonorrhea: NEGATIVE

## 2020-04-28 ENCOUNTER — Other Ambulatory Visit (INDEPENDENT_AMBULATORY_CARE_PROVIDER_SITE_OTHER): Payer: Self-pay | Admitting: Adult Health

## 2020-04-28 DIAGNOSIS — E8881 Metabolic syndrome: Secondary | ICD-10-CM

## 2020-05-02 NOTE — Progress Notes (Addendum)
GYN VISIT Patient name: Brooke [name] MRN [id number]  Date of birth: 01-Mar-1970 Chief Complaint:   Menorrhagia  History of Present Illness:   Brooke [name] is a 50 y.o. [phone]  female being seen today for:   -Abnormal uterine bleeding: Today she notes that for a while her periods had been regular each month; however they became worse in April.   Menses lasted for 20 days in April, stopped for 3 days then returned for a few more weeks.  Reports moderate bleeding, usually about 2 pads per day.  Prior to April, menses usually lasted for about 6 days.  Notes some dysmenorrhea, no change with tylenol.  +nausea, no vomiting.   Previously had endometrial ablation in 2016.  At this point, she is sick of menses and wishes to have a permanent resolution. Pt is not working and reports plenty of time for recovery from a surgical procedure.  Records reviewed- last seen by Dr. Glo Herring 04/2019- US/SHG showed 4cm intramural fibroid.  Inability to expand cavity.   [place] 03/2019: 8x5.5x7.7xm uterus, normal ovaries bilaterally.  Patient's last menstrual period was 04/02/2020.  Depression screen [facility] 2/9 02/22/2020 11/25/2019 11/16/2019 10/22/2019 10/14/2019  Decreased Interest 0 1 0 1 1  Down, Depressed, Hopeless 0 1 0 1 1  PHQ - 2 Score 0 2 0 2 2  Altered sleeping - 1 1 1 2   Tired, decreased energy - 1 1 3 3   Change in appetite - 1 1 0 3  Feeling bad or failure about yourself  - 1 0 1 3  Trouble concentrating - 1 0 1 3  Moving slowly or fidgety/restless - 0 0 0 0  Suicidal thoughts - 0 0 1 1  PHQ-9 Score - 7 3 9 17   Difficult doing work/chores - Not difficult at all Not difficult at all Somewhat difficult Somewhat difficult  Some encounter information is confidential and restricted. Go to Review Flowsheets activity to see all data.  Some recent data might be hidden     Review of Systems:   Pertinent items are noted in HPI Denies fever/chills, dizziness, headaches, visual disturbances, fatigue,  shortness of breath, chest pain, abdominal pain, vomiting, bowel movements, urination, or intercourse unless otherwise stated above.  Pertinent History Reviewed:  Reviewed past medical,surgical, social, obstetrical and family history.  Reviewed problem list, medications and allergies.  Prior abdominal surgery: Gastric bypass, tubal ligation  Physical Assessment:   Vitals:   05/04/20 0849  BP: 119/78  Pulse: 81  Weight: 198 lb 6.4 oz (90 kg)  Height: 5\' 2"  (1.575 m)  Body mass index is 36.29 kg/m.       Physical Examination:   General appearance: alert, well appearing, and in no distress  Psych: mood appropriate, normal affect  Skin: warm & dry   Cardiovascular: normal heart rate noted  Respiratory: normal respiratory effort, no distress  Abdomen: soft, non-tender, no rebound, no guarding, no palpable masses  Pelvic: normal external genitalia and vulva, vagina- thick clumpy white discharge noted, cervix, uterus and adnexa normal, narrow pubic arch  Extremities: no edema   Chaperone: Celene Squibb    Assessment & Plan:  1) Abnormal uterine bleeding -Reviewed conservative vs surgical intervention.  Due to prior ablation with suspected scarring of endometrial cavity- options would either include POP or hysterectomy.  Discuss risk/benefit including but not limited to risk of bleeding, infection and injury to surrounding organs.  Discussed ovarian preservation. Based on prior [facility], suspect that EMB would not be adequate  and increased risk of perforation.  Due to inability to collect EMB, discussed small but potential risk of undiagnosed endometrial cancer and potential need for further intervention pending results of surgical pathology.  Questions and concerns were addressed and she does desire to proceed.  Plan to schedule at next available- discussed follow up preop appointment- pt declined and felt comfortable moving forward with surgery once scheduled.  ADDENDUM: -Abnormal  discharge- vaginitis panel obtained Results suggestive of BV and yeast- Rx sent in  Janyth Pupa, DO Attending [place], [place] for [facility], [place]

## 2020-05-04 ENCOUNTER — Encounter: Payer: Self-pay | Admitting: Obstetrics & Gynecology

## 2020-05-04 ENCOUNTER — Other Ambulatory Visit: Payer: Self-pay

## 2020-05-04 ENCOUNTER — Other Ambulatory Visit (HOSPITAL_COMMUNITY)
Admission: RE | Admit: 2020-05-04 | Discharge: 2020-05-04 | Disposition: A | Discharge status: Home / Self Care | Payer: 59 | Source: Ambulatory Visit | Attending: Adult Health | Admitting: Adult Health

## 2020-05-04 ENCOUNTER — Ambulatory Visit (INDEPENDENT_AMBULATORY_CARE_PROVIDER_SITE_OTHER): Payer: 59 | Admitting: Obstetrics & Gynecology

## 2020-05-04 VITALS — BP 119/78 | HR 81 | Ht 62.0 in | Wt 198.4 lb

## 2020-05-04 DIAGNOSIS — Z9889 Other specified postprocedural states: Secondary | ICD-10-CM | POA: Diagnosis not present

## 2020-05-04 DIAGNOSIS — N898 Other specified noninflammatory disorders of vagina: Secondary | ICD-10-CM

## 2020-05-04 DIAGNOSIS — N939 Abnormal uterine and vaginal bleeding, unspecified: Secondary | ICD-10-CM | POA: Insufficient documentation

## 2020-05-05 LAB — CERVICOVAGINAL ANCILLARY ONLY
Bacterial Vaginitis (gardnerella): POSITIVE — AB
Candida Glabrata: NEGATIVE
Candida Vaginitis: POSITIVE — AB
Chlamydia: NEGATIVE
Comment: NEGATIVE
Comment: NEGATIVE
Comment: NEGATIVE
Comment: NEGATIVE
Comment: NEGATIVE
Comment: NORMAL
Neisseria Gonorrhea: NEGATIVE
Trichomonas: NEGATIVE

## 2020-05-05 MED ORDER — FLUCONAZOLE 150 MG PO TABS: 150.0000 mg | ORAL_TABLET | Freq: Once | ORAL | 0 refills | Status: AC

## 2020-05-05 MED ORDER — METRONIDAZOLE 500 MG PO TABS: 500.0000 mg | ORAL_TABLET | Freq: Two times a day (BID) | ORAL | 0 refills | Status: AC

## 2020-05-05 NOTE — Addendum Note (Signed)
Addended by: Annalee Genta on: 05/05/2020 03:15 PM   Modules accepted: Orders

## 2020-05-09 ENCOUNTER — Telehealth: Payer: Self-pay | Admitting: Obstetrics & Gynecology

## 2020-05-09 ENCOUNTER — Ambulatory Visit (INDEPENDENT_AMBULATORY_CARE_PROVIDER_SITE_OTHER): Payer: Medicaid Other | Admitting: Family Medicine

## 2020-05-09 NOTE — Telephone Encounter (Signed)
I forwarded the message to Dr Nelda Marseille

## 2020-05-09 NOTE — Telephone Encounter (Signed)
Pt having lower abdominal pain & radiating down her legs x3days Pt states cycle has not started  Suggestions on what to do?  States is waiting for surgery to be scheduled   Please advise & notify pt

## 2020-05-10 ENCOUNTER — Telehealth: Payer: Self-pay | Admitting: Obstetrics & Gynecology

## 2020-05-10 ENCOUNTER — Other Ambulatory Visit (INDEPENDENT_AMBULATORY_CARE_PROVIDER_SITE_OTHER): Payer: Self-pay | Admitting: Bariatrics

## 2020-05-10 DIAGNOSIS — E88819 Insulin resistance, unspecified: Secondary | ICD-10-CM

## 2020-05-10 DIAGNOSIS — E8881 Metabolic syndrome: Secondary | ICD-10-CM

## 2020-05-10 NOTE — Telephone Encounter (Signed)
Pt last seen by Dr. Brown.  

## 2020-05-10 NOTE — Telephone Encounter (Signed)
Patient called stating that she spoke with Dr. Nelda Marseille regarding pain on her leg. Patient states that she is not able to get an appointment with her provider for the leg pain, but she states that she is feeling better from her leg. She states that her Stomach is hurting now just a little bit but she would like to go ahead with the surgery. Please contact pt

## 2020-05-10 NOTE — Telephone Encounter (Signed)
Called patient back.  She reports some intermittent pain radiating down both her legs.  She was supposed to get her period the first week of the month, but it has not happened yet.  I expressed to her that during our visit,I had thought there was a chance the leg pain may be due to her menses as they occurred at the same time.  However, if the leg pain is occurring outside of her menses, this may be something else.  I encouraged pt to call PCP for further evaluation/work up.  Additionally, prior to surgery pt should see PCP to be "cleared" for surgery.

## 2020-05-11 ENCOUNTER — Encounter (INDEPENDENT_AMBULATORY_CARE_PROVIDER_SITE_OTHER): Payer: Self-pay | Admitting: Family Medicine

## 2020-05-11 ENCOUNTER — Ambulatory Visit (INDEPENDENT_AMBULATORY_CARE_PROVIDER_SITE_OTHER): Payer: 59 | Admitting: Family Medicine

## 2020-05-11 ENCOUNTER — Other Ambulatory Visit: Payer: Self-pay

## 2020-05-11 VITALS — BP 106/72 | HR 78 | Temp 98.1°F | Ht 62.0 in | Wt 192.0 lb

## 2020-05-11 DIAGNOSIS — E559 Vitamin D deficiency, unspecified: Secondary | ICD-10-CM

## 2020-05-11 DIAGNOSIS — E8881 Metabolic syndrome: Secondary | ICD-10-CM

## 2020-05-11 DIAGNOSIS — Z6838 Body mass index (BMI) 38.0-38.9, adult: Secondary | ICD-10-CM | POA: Diagnosis not present

## 2020-05-11 DIAGNOSIS — Z9189 Other specified personal risk factors, not elsewhere classified: Secondary | ICD-10-CM | POA: Diagnosis not present

## 2020-05-11 MED ORDER — TRULICITY 3 MG/0.5ML ~~LOC~~ SOAJ: 3.0000 mg | SUBCUTANEOUS | 0 refills | Status: DC

## 2020-05-12 NOTE — Progress Notes (Signed)
Chief Complaint:   OBESITY Brooke [name] is here to discuss her progress with her obesity treatment plan along with follow-up of her obesity related diagnoses. Brooke [name] is on the [employer] and states she is following her eating plan approximately 75% of the time. Brooke [name] states she is walking 30 minutes 3 times per week.  Today's visit was #: 11 Starting weight: 217 lbs Starting date: 09/14/2019 Today's weight: 192 lbs Today's date: 05/11/2020 Total lbs lost to date: 25 lbs Total lbs lost since last in-office visit: 8  Interim History: Brooke [name] has been trying hard to follow the meal plan as strictly as she can. Her biggest obstacle is bread (she doesn't like the 45 calorie bread). She is following Pescatarian and alternating between all options. Pt is planning for hysterectomy in mid-June.  Subjective:   1. Insulin resistance Brooke [name] is on Trulicity with minimal side effects. Her last A1c was 5.1 and insulin level 5.1.  2. Vitamin D deficiency Brooke [name]'s Vitamin D level was 56.4 on 03/26/2020. She is currently taking prescription vitamin D 50,000 IU each week. She denies nausea, vomiting or muscle weakness.  3. At risk for activity intolerance Brooke [name] is at risk for exercise intolerance due to not doing much resistance training.  Assessment/Plan:   1. Insulin resistance Brooke [name] will continue to work on weight loss, exercise, and decreasing simple carbohydrates to help decrease the risk of diabetes. Brooke [name] agreed to follow-up with [place] as directed to closely monitor her progress.  - Dulaglutide (TRULICITY) 3 GE/9.5MW SOPN; Inject 3 mg as directed once a week.  Dispense: 2 mL; Refill: 0  2. Vitamin D deficiency Low Vitamin D level contributes to fatigue and are associated with obesity, breast, and colon cancer. She agrees to continue to take prescription Vitamin D @50 ,000 IU every week and will follow-up for routine testing of Vitamin D, at least 2-3 times per year to avoid  over-replacement.  3. At risk for activity intolerance Brooke [name] was given approximately 15 minutes of exercise intolerance counseling today. She is 50 y.o. female and has risk factors exercise intolerance including obesity. We discussed intensive lifestyle modifications today with an emphasis on specific weight loss instructions and strategies. Brooke [name] will slowly increase activity as tolerated.  Repetitive spaced learning was employed today to elicit superior memory formation and behavioral change.  4. Class 2 severe obesity with serious comorbidity and body mass index (BMI) of 38.0 to 38.9 in adult, unspecified obesity type (HCC)  Brooke [name] is currently in the action stage of change. As such, her goal is to continue with weight loss efforts. She has agreed to the Category 2 Plan and the [place].   Exercise goals: As is-start resistance training 10-15 minutes 2-3 times a week.  Behavioral modification strategies: increasing lean protein intake, meal planning and cooking strategies, keeping healthy foods in the home and planning for success.  Brooke [name] has agreed to follow-up with our clinic in 3 weeks. She was informed of the importance of frequent follow-up visits to maximize her success with intensive lifestyle modifications for her multiple health conditions.   Objective:   Blood pressure 106/72, pulse 78, temperature 98.1 F (36.7 C), height 5\' 2"  (1.575 m), weight 192 lb (87.1 kg), SpO2 99 %. Body mass index is 35.12 kg/m.  General: Cooperative, alert, well developed, in no acute distress. HEENT: Conjunctivae and lids unremarkable. Cardiovascular: Regular rhythm.  Lungs: Normal work of breathing. Neurologic: No focal deficits.   Lab Results  Component Value Date   CREATININE  0.80 04/22/2020   BUN 10 04/22/2020   NA 140 04/22/2020   K 3.6 04/22/2020   CL 110 04/22/2020   CO2 21 (L) 04/22/2020   Lab Results  Component Value Date   ALT 35 (H) 03/24/2020   AST 21  03/24/2020   ALKPHOS 74 03/24/2020   BILITOT 0.3 03/24/2020   Lab Results  Component Value Date   HGBA1C 5.1 03/24/2020   HGBA1C 5.2 09/14/2019   HGBA1C 5.3 08/05/2019   Lab Results  Component Value Date   INSULIN 5.1 03/24/2020   INSULIN 5.9 09/14/2019   Lab Results  Component Value Date   TSH 1.390 09/14/2019   Lab Results  Component Value Date   CHOL 193 03/24/2020   HDL 64 03/24/2020   LDLCALC 117 (H) 03/24/2020   TRIG 66 03/24/2020   CHOLHDL 3.0 03/24/2020   Lab Results  Component Value Date   WBC 4.8 04/22/2020   HGB 13.9 04/22/2020   HCT 43.0 04/22/2020   MCV 98.2 04/22/2020   PLT 357 04/22/2020   Lab Results  Component Value Date   IRON 111 09/03/2019   TIBC 292 09/03/2019   FERRITIN 187 09/03/2019    Attestation Statements:   Reviewed by clinician on day of visit: allergies, medications, problem list, medical history, surgical history, family history, social history, and previous encounter notes.  Coral Ceo, CMA, am acting as transcriptionist for Coralie Common, MD.   I have reviewed the above documentation for accuracy and completeness, and I agree with the above. - Jinny Blossom, MD

## 2020-05-16 ENCOUNTER — Ambulatory Visit (INDEPENDENT_AMBULATORY_CARE_PROVIDER_SITE_OTHER): Payer: 59 | Admitting: Internal Medicine

## 2020-05-16 ENCOUNTER — Other Ambulatory Visit: Payer: Self-pay | Admitting: *Deleted

## 2020-05-16 ENCOUNTER — Other Ambulatory Visit: Payer: Self-pay

## 2020-05-16 ENCOUNTER — Encounter: Payer: Self-pay | Admitting: Internal Medicine

## 2020-05-16 VITALS — BP 118/78 | HR 70 | Temp 98.1°F | Resp 18 | Ht 62.0 in | Wt 195.0 lb

## 2020-05-16 DIAGNOSIS — R252 Cramp and spasm: Secondary | ICD-10-CM | POA: Diagnosis not present

## 2020-05-16 DIAGNOSIS — D170 Benign lipomatous neoplasm of skin and subcutaneous tissue of head, face and neck: Secondary | ICD-10-CM

## 2020-05-16 DIAGNOSIS — R221 Localized swelling, mass and lump, neck: Secondary | ICD-10-CM | POA: Diagnosis not present

## 2020-05-16 MED ORDER — CYCLOBENZAPRINE HCL 5 MG PO TABS: 5.0000 mg | ORAL_TABLET | Freq: Three times a day (TID) | ORAL | 1 refills | Status: DC | PRN

## 2020-05-16 NOTE — Patient Instructions (Signed)
Please take Flexeril as needed for leg cramps.  Please maintain adequate hydration by taking at least 64 ounces of fluid.  Avoid skipping any meals.  You are being scheduled to get US of the neck for evaluation of neck mass.

## 2020-05-16 NOTE — Progress Notes (Signed)
[id number]

## 2020-05-16 NOTE — Progress Notes (Signed)
Acute Office Visit  Subjective:    Patient ID: Brooke [name], female    DOB: 10/08/1970, 50 y.o.   MRN: [id number]  Chief Complaint  Patient presents with  . Leg Pain    Pt has been having right leg pain for about a year thinks this is due to the fact she is supposed to be having hysterectomy in June also lipoma on back of neck feels bigger     HPI Patient is in today for evaluation of right leg pain, which is intermittent and cramping in nature. She attributes it to her nervousness related to her scheduled hysterectomy. She denies any recent injury or heavy lifting. Denies any numbness or tingling of the LE.  She is still concerned about soft tissue swelling/mass in her left clavicular area and states that it has been growing in size and is painful/distressing at times.  Past Medical History:  Diagnosis Date  . Acid reflux   . Amenorrhea 02/06/2012  . Anxiety   . Arthritis    [name] 10/13/2019  . B12 deficiency   . Breast lump 08/06/2019  . Cervical radiculitis   . Chest pain   . Chronic constipation   . DDD (degenerative disc disease), lumbar   . Depression   . Depression    [name] 10/13/2019  . Dizzy spells   . Elevated vitamin B12 level 05/04/2019  . Esophageal dysphagia 11/19/2012  . Facial numbness   . Fatty liver   . GERD (gastroesophageal reflux disease)    [name] 10/13/2019  . Headache(784.0) 04/01/2012  . Heart palpitations 01/2017  . High cholesterol   . History of anemia   . History of hiatal hernia   . Hypertension   . Insomnia   . Intractable migraine with visual aura and without status migrainosus 01/23/2017  . Iron deficiency anemia   . Irregular periods 08/06/2019  . Joint pain   . Lactose intolerance   . LUQ pain 11/19/2012  . Menopausal symptom 08/06/2019  . Migraines    occ  . Near syncope 01/2017  . Numbness and tingling 10/08/2016   Formatting of this note might be different from the original. ---Oct 2018-TEE----Normal left ventricular size  and systolic function with no appreciable segmental abnormality. EF 60% There was no evidence of spontaneous echo contrast or thrombus in the left atrium or left atrial appendage. No significant valvular abnormalites noted Bubble study performed, this is negative.  . Numbness and tingling in left arm   . Obesity   . Other malaise and fatigue 05/19/2012  . Panic attacks   . Sciatica   . Seizures (HCC)   . Slurred speech 11/07/2016   Formatting of this note might be different from the original. ---Oct 2018-TEE----Normal left ventricular size and systolic function with no appreciable segmental abnormality. EF 60% There was no evidence of spontaneous echo contrast or thrombus in the left atrium or left atrial appendage. No significant valvular abnormalites noted Bubble study performed, this is negative.  . Small bowel obstruction (HCC) 07/20/2017  . Spells of speech arrest 01/23/2017  . Transient cerebral ischemia 10/08/2016   Formatting of this note might be different from the original. ---Oct 2018-TEE----Normal left ventricular size and systolic function with no appreciable segmental abnormality. EF 60% There was no evidence of spontaneous echo contrast or thrombus in the left atrium or left atrial appendage. No significant valvular abnormalites noted Bubble study performed, this is negative.  . Vitamin D deficiency   . Word finding difficulty 01/23/2017  Past Surgical History:  Procedure Laterality Date  . BUNIONECTOMY Left yrs ago  . CERVICAL ABLATION  2017  . COLONOSCOPY, ESOPHAGOGASTRODUODENOSCOPY (EGD) AND ESOPHAGEAL DILATION N/A 12/03/2012   JHE:RDEYCXKG melanosis throughout the entire examined colon/The colon IS redundant/Small internal hemorrhoids/EGD:Esophageal web/Medium sized hiatal hernia/MILD Non-erosive gastritis  . ESOPHAGOGASTRODUODENOSCOPY  03/09/09   Dr. Wilford Corner, normal EGD, s/p Bravo capsule placement  . ESOPHAGOGASTRODUODENOSCOPY N/A 06/24/2019   rourk: Status post  gastric bypass procedure, normal esophagus status post dilation  . EYE SURGERY N/A    [name] 10/13/2019  . GASTRIC ROUX-EN-Y N/A 07/16/2017   Procedure: LAPAROSCOPIC ROUX-EN-Y GASTRIC BYPASS WITH UPPER ENDOSCOPY AND ERAS PATHWAY;  Surgeon: Johnathan Hausen, MD;  Location: [facility];  Service: General;  Laterality: N/A;  . LAPAROSCOPY N/A 07/20/2017   Procedure: LAPAROSCOPY DIAGNOSTIC. REDUCTION OF SMALL BOWEL OBSTRUCTION. REPAIR OF TROCAR HERNIA.;  Surgeon: Alphonsa Overall, MD;  Location: [facility];  Service: General;  Laterality: N/A;  Venia Minks DILATION N/A 06/24/2019   Procedure: Keturah Shavers;  Surgeon: Daneil Dolin, MD;  Location: [facility];  Service: Endoscopy;  Laterality: N/A;  . TUBAL LIGATION    . WISDOM TOOTH EXTRACTION      Family History  Problem Relation Age of Onset  . Diabetes Mother   . Hypertension Mother   . Drug abuse Mother   . Anxiety disorder Mother   . Depression Mother   . CAD Mother        CABG in 36s  . Lung cancer Mother   . Hyperlipidemia Mother   . Heart disease Mother   . Cancer Mother   . Obesity Mother   . Hypertension Father   . Sudden death Father   . Diabetes Sister   . Hypertension Sister   . Hypertension Brother   . Drug abuse Brother   . CAD Brother        s/p CABG in 87s  . Diabetes Paternal Grandmother   . Hypertension Brother   . Drug abuse Brother   . CAD Brother        "HEart artery blockages" in 65s  . Hypertension Brother   . Drug abuse Brother   . Anxiety disorder Maternal Grandmother   . Depression Maternal Grandmother   . Breast cancer Maternal Grandmother        breast  . Asthma Other   . Heart disease Other   . Colon cancer Neg Hx   . Gastric cancer Neg Hx   . Esophageal cancer Neg Hx     Social History   Socioeconomic History  . Marital status: Married    Spouse name: Randall Hiss   . Number of children: 2  . Years of education: Not on file  . Highest education level: Some college, no degree  Occupational History   . Occupation: stay at home  Tobacco Use  . Smoking status: Former Smoker    Packs/day: 0.30    Years: 23.00    Pack years: 6.90    Types: Cigarettes    Quit date: 10/04/2012    Years since quitting: 7.6  . Smokeless tobacco: Never Used  . Tobacco comment: less than 1/2 pack cigarettes daily  Vaping Use  . Vaping Use: Never used  Substance and Sexual Activity  . Alcohol use: Yes    Comment: weekends; hardly/social  . Drug use: No  . Sexual activity: Yes    Partners: Male    Birth control/protection: Surgical  Other Topics Concern  . Not on file  Social History Narrative   Lives with husband, married 38 years    36 son Teron    50 son Jiles Harold -two grandchildren    Live close by    Rising cousin-custody of her daughter 26 [place]       Right handed   Pets: none      Enjoys: [employer], shopping, likes being outside       Diet: eggs, oatmeal, salad, all food groups no lot of proteins, good on veggies.    Caffeine: sweet tea-2 cups  Coffee-1 cup daily    Water: 2-3 16 oz bottles daily       Wears seat belt    Smoke and carbon monoxide detectors   Does use phone while driving but hands free   Social Determinants of Health   Financial Resource Strain: Not on file  Food Insecurity: Not on file  Transportation Needs: Not on file  Physical Activity: Not on file  Stress: Not on file  Social Connections: Not on file  Intimate Partner Violence: Not on file    Outpatient Medications Prior to Visit  Medication Sig Dispense Refill  . acetaminophen (TYLENOL) 500 MG tablet Take 1,000 mg by mouth daily as needed for moderate pain or headache.    . Biotin 10000 MCG TABS Take 5,000 mcg by mouth every other day.     . calcium carbonate (OSCAL) 1500 (600 Ca) MG TABS tablet Take 600 mg by mouth daily.    . diclofenac Sodium (VOLTAREN) 1 % GEL Apply 1 g topically 3 (three) times daily as needed for pain.    . Dulaglutide (TRULICITY) 3 DC/3.0DT SOPN Inject 3 mg as directed once a week. 2  mL 0  . fluticasone (FLONASE) 50 MCG/ACT nasal spray Place 2 sprays into both nostrils daily. (Patient taking differently: Place 2 sprays into both nostrils daily. As needed) 16 g 1  . levETIRAcetam (KEPPRA) 250 MG tablet Take 250 mg by mouth at bedtime.    Marland Kitchen levocetirizine (XYZAL) 5 MG tablet Take 1 tablet (5 mg total) by mouth every evening. (Patient taking differently: Take 5 mg by mouth daily as needed for allergies.) 30 tablet 2  . Multiple Vitamins-Minerals (BARIATRIC MULTIVITAMINS/IRON PO) Take 1 tablet by mouth daily.     . ondansetron (ZOFRAN) 4 MG tablet Take 1 tablet (4 mg total) by mouth every 8 (eight) hours as needed for nausea or vomiting. 12 tablet 0  . pantoprazole (PROTONIX) 40 MG tablet Take 1 tablet (40 mg total) by mouth 2 (two) times daily before a meal. 180 tablet 3  . QUEtiapine (SEROQUEL) 25 MG tablet Take 1 tablet (25 mg total) by mouth 2 (two) times daily. (Patient taking differently: Take 25 mg by mouth at bedtime.) 60 tablet 1  . topiramate (TOPAMAX) 100 MG tablet Take 100 mg by mouth 2 (two) times daily.     Marland Kitchen venlafaxine XR (EFFEXOR-XR) 150 MG 24 hr capsule Total of 225 mg along with 75 mg tab (Patient taking differently: Take 150 mg by mouth See admin instructions. Total of 225 mg along with 75 mg tab daily) 90 capsule 1  . venlafaxine XR (EFFEXOR-XR) 75 MG 24 hr capsule 225 mg daily. Take along with 150 mg cap (Patient taking differently: Take 75 mg by mouth See admin instructions. 225 mg daily. Take along with 150 mg cap) 90 capsule 1  . Vitamin D, Ergocalciferol, (DRISDOL) 1.25 MG (50000 UNIT) CAPS capsule Take 1 capsule (50,000 Units total) by mouth every 7 (  seven) days. 12 capsule 1   No facility-administered medications prior to visit.    No Known Allergies  Review of Systems  Constitutional: Negative for chills and fever.  HENT: Negative for congestion and sore throat.   Respiratory: Negative for cough and shortness of breath.   Cardiovascular: Negative  for chest pain and palpitations.  Gastrointestinal: Negative for constipation, nausea and vomiting.  Musculoskeletal: Positive for myalgias. Negative for neck pain and neck stiffness.  Skin: Negative for rash.  Neurological: Negative for dizziness and weakness.       Objective:    Physical Exam Constitutional:      General: She is not in acute distress.    Appearance: She is not diaphoretic.  HENT:     Mouth/Throat:     Mouth: Mucous membranes are moist.  Eyes:     General: No scleral icterus.    Extraocular Movements: Extraocular movements intact.  Cardiovascular:     Rate and Rhythm: Normal rate and regular rhythm.     Pulses: Normal pulses.     Heart sounds: Normal heart sounds. No murmur heard.   Pulmonary:     Breath sounds: Normal breath sounds. No wheezing or rales.  Skin:    General: Skin is warm.     Comments: About 1 cm in diameter lipoma over left clavicular border  Neurological:     General: No focal deficit present.     Mental Status: She is alert and oriented to person, place, and time.     BP 118/78 (BP Location: Right Arm, Patient Position: Sitting, Cuff Size: Normal)   Pulse 70   Temp 98.1 F (36.7 C) (Oral)   Resp 18   Ht $R'5\' 2"'kC$  (1.575 m)   Wt 195 lb (88.5 kg)   SpO2 96%   BMI 35.67 kg/m  Wt Readings from Last 3 Encounters:  05/16/20 195 lb (88.5 kg)  05/11/20 192 lb (87.1 kg)  05/04/20 198 lb 6.4 oz (90 kg)    Health Maintenance Due  Topic Date Due  . COVID-19 Vaccine (3 - Pfizer risk 4-dose series) 05/01/2019    There are no preventive care reminders to display for this patient.   Lab Results  Component Value Date   TSH 1.390 09/14/2019   Lab Results  Component Value Date   WBC 4.8 04/22/2020   HGB 13.9 04/22/2020   HCT 43.0 04/22/2020   MCV 98.2 04/22/2020   PLT 357 04/22/2020   Lab Results  Component Value Date   NA 140 04/22/2020   K 3.6 04/22/2020   CO2 21 (L) 04/22/2020   GLUCOSE 80 04/22/2020   BUN 10 04/22/2020    CREATININE 0.80 04/22/2020   BILITOT 0.3 03/24/2020   ALKPHOS 74 03/24/2020   AST 21 03/24/2020   ALT 35 (H) 03/24/2020   PROT 6.3 03/24/2020   ALBUMIN 4.0 03/24/2020   CALCIUM 8.5 (L) 04/22/2020   ANIONGAP 9 04/22/2020   EGFR 90 03/24/2020   Lab Results  Component Value Date   CHOL 193 03/24/2020   Lab Results  Component Value Date   HDL 64 03/24/2020   Lab Results  Component Value Date   LDLCALC 117 (H) 03/24/2020   Lab Results  Component Value Date   TRIG 66 03/24/2020   Lab Results  Component Value Date   CHOLHDL 3.0 03/24/2020   Lab Results  Component Value Date   HGBA1C 5.1 03/24/2020       Assessment & Plan:   Problem List Items  Addressed This Visit      Visit Diagnoses    Neck mass    -  Primary Appears to be same size compared to last visit Will obtain US soft tissue neck to r/o any neoplastic process Reassured patient about likelihood of benign mass   Relevant Medications   cyclobenzaprine (FLEXERIL) 5 MG tablet   Other Relevant Orders   US SOFT TISSUE NECK   Leg cramps     No red flags Flexeril PRN Avoid heavy lifting       Meds ordered this encounter  Medications  . cyclobenzaprine (FLEXERIL) 5 MG tablet    Sig: Take 1 tablet (5 mg total) by mouth 3 (three) times daily as needed for muscle spasms.    Dispense:  30 tablet    Refill:  1     Lititia Sen Keith Rake, MD
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
## 2020-05-20 ENCOUNTER — Ambulatory Visit (HOSPITAL_COMMUNITY)
Admission: RE | Admit: 2020-05-20 | Discharge: 2020-05-20 | Disposition: A | Discharge status: Home / Self Care | Payer: 59 | Source: Ambulatory Visit | Attending: Internal Medicine | Admitting: Internal Medicine

## 2020-05-20 DIAGNOSIS — D170 Benign lipomatous neoplasm of skin and subcutaneous tissue of head, face and neck: Secondary | ICD-10-CM | POA: Insufficient documentation

## 2020-05-23 ENCOUNTER — Other Ambulatory Visit: Payer: Self-pay

## 2020-05-23 ENCOUNTER — Telehealth (INDEPENDENT_AMBULATORY_CARE_PROVIDER_SITE_OTHER): Payer: 59 | Admitting: Psychiatry

## 2020-05-23 ENCOUNTER — Encounter: Payer: Self-pay | Admitting: Psychiatry

## 2020-05-23 DIAGNOSIS — F330 Major depressive disorder, recurrent, mild: Secondary | ICD-10-CM | POA: Diagnosis not present

## 2020-05-23 DIAGNOSIS — F410 Panic disorder [episodic paroxysmal anxiety] without agoraphobia: Secondary | ICD-10-CM

## 2020-05-23 MED ORDER — QUETIAPINE FUMARATE 25 MG PO TABS: 25.0000 mg | ORAL_TABLET | Freq: Every evening | ORAL | 0 refills | Status: DC

## 2020-05-23 NOTE — Telephone Encounter (Signed)
Spoke with patient- she was seen regarding her leg.  They also feel it may be related to her menses.  Pt desires to proceed with hysterectomy- will plan to schedule at next available.  Questions and concerns were addressed.

## 2020-05-23 NOTE — Patient Instructions (Signed)
1.Continuevenlafaxine225mg at night 2  Continue quetiapine at night  3.Next appointment: 8/22 at 9 AM  The next visit will be in person visit. Please arrive 15 mins before the scheduled time.   [facility]  Address: [address], [place], [place] [address]

## 2020-06-07 ENCOUNTER — Other Ambulatory Visit (INDEPENDENT_AMBULATORY_CARE_PROVIDER_SITE_OTHER): Payer: Self-pay | Admitting: Family Medicine

## 2020-06-07 DIAGNOSIS — E8881 Metabolic syndrome: Secondary | ICD-10-CM

## 2020-06-07 NOTE — Telephone Encounter (Signed)
Last seen by DR Jearld Shines

## 2020-06-08 ENCOUNTER — Ambulatory Visit (INDEPENDENT_AMBULATORY_CARE_PROVIDER_SITE_OTHER): Payer: 59 | Admitting: Family Medicine

## 2020-06-09 ENCOUNTER — Ambulatory Visit (INDEPENDENT_AMBULATORY_CARE_PROVIDER_SITE_OTHER): Payer: 59 | Admitting: Family Medicine

## 2020-06-09 ENCOUNTER — Other Ambulatory Visit: Payer: Self-pay

## 2020-06-09 ENCOUNTER — Encounter (INDEPENDENT_AMBULATORY_CARE_PROVIDER_SITE_OTHER): Payer: Self-pay | Admitting: Family Medicine

## 2020-06-09 VITALS — BP 124/82 | HR 79 | Temp 97.7°F | Ht 62.0 in | Wt 182.0 lb

## 2020-06-09 DIAGNOSIS — Z9189 Other specified personal risk factors, not elsewhere classified: Secondary | ICD-10-CM

## 2020-06-09 DIAGNOSIS — Z6838 Body mass index (BMI) 38.0-38.9, adult: Secondary | ICD-10-CM

## 2020-06-09 DIAGNOSIS — E8881 Metabolic syndrome: Secondary | ICD-10-CM | POA: Diagnosis not present

## 2020-06-09 DIAGNOSIS — E559 Vitamin D deficiency, unspecified: Secondary | ICD-10-CM | POA: Diagnosis not present

## 2020-06-09 MED ORDER — TRULICITY 3 MG/0.5ML ~~LOC~~ SOAJ: 3.0000 mg | SUBCUTANEOUS | 0 refills | Status: DC

## 2020-06-09 MED ORDER — VITAMIN D (ERGOCALCIFEROL) 1.25 MG (50000 UNIT) PO CAPS: 50000.0000 [IU] | ORAL_CAPSULE | ORAL | 1 refills | Status: DC

## 2020-06-10 ENCOUNTER — Other Ambulatory Visit (HOSPITAL_COMMUNITY): Payer: 59

## 2020-06-14 ENCOUNTER — Ambulatory Visit: Admit: 2020-06-14 | Payer: 59 | Admitting: Obstetrics & Gynecology

## 2020-06-14 SURGERY — HYSTERECTOMY, TOTAL, LAPAROSCOPIC, WITH SALPINGECTOMY
Anesthesia: General | Laterality: Bilateral

## 2020-06-14 NOTE — Progress Notes (Signed)
Chief Complaint:   OBESITY Brooke [name] is here to discuss her progress with her obesity treatment plan along with follow-up of her obesity related diagnoses. Brooke [name] is on the Category 2 Plan and Pescatarian and states she is following her eating plan approximately 90% of the time. Brooke [name] states she is performing the Band-IT exercises using an exercise band for approximately  30-45 minutes 3-5 times per week.  Today's visit was #: 12 Starting weight: 217 lbs Starting date: 09/14/2019 Today's weight: 182 lbs Today's date: 06/09/2020 Total lbs lost to date: 35 Total lbs lost since last in-office visit: 10  Interim History: Brooke [name] has had a good last couple of weeks. She is following her meal plan and trying to incorporate more activity into her lifestyle. On June 18'th, she may be going to the beach. Since her last visit, she reports that she was thrown a surprise 2'th birthday party.   Subjective:   1. Insulin resistance Brooke [name] is on Trulicity 3mg  subcutaneously once weekly. She does not report any GI side effects. Her last A1c was in March of 2022.  Lab Results  Component Value Date   HGBA1C 5.1 03/24/2020   2. Vitamin D deficiency Brooke [name] does report feeling fatigued, but she has not experienced any nausea, vomiting, or muscle weakness. She is still on Vitamin D replacement once weekly. Her last Vitamin D level was 56.4.  3. At risk for osteoporosis Brooke [name] is at higher risk of osteopenia and osteoporosis due to Vitamin D deficiency.  Assessment/Plan:   1. Insulin resistance Brooke [name] will continue to work on weight loss, exercise, and decreasing simple carbohydrates to help decrease the risk of diabetes. Brooke [name] agreed to follow-up with [place] as directed to closely monitor her progress. Continue Trulicity, and refill as per below:  - Refill: Dulaglutide (TRULICITY) 3 JK/9.3OI SOPN; Inject 3 mg as directed once a week.  Dispense: 2 mL; Refill: 0  2. Vitamin D deficiency Low Vitamin D  level contributes to fatigue and are associated with obesity, breast, and colon cancer. She agrees to continue to take prescription Vitamin D @50 ,000 IU every week and will follow-up for routine testing of Vitamin D, at least 2-3 times per year to avoid over-replacement.   - Refill: Vitamin D, Ergocalciferol, (DRISDOL) 1.25 MG (50000 UNIT) CAPS capsule; Take 1 capsule (50,000 Units total) by mouth every 7 (seven) days.  Dispense: 12 capsule; Refill: 1  3. At risk for osteoporosis Brooke [name] was given approximately 15 minutes of osteoporosis prevention counseling today. Brooke [name] is at risk for osteopenia and osteoporosis due to her Vitamin D deficiency. She was encouraged to take her Vitamin D and follow her higher calcium diet and increase strengthening exercise to help strengthen her bones and decrease her risk of osteopenia and osteoporosis.  Repetitive spaced learning was employed today to elicit superior memory formation and behavioral change.  4. Class 2 severe obesity with serious comorbidity and body mass index (BMI) of 38.0 to 38.9 in adult, unspecified obesity type (HCC) Brooke [name] is currently in the action stage of change. As such, her goal is to continue with weight loss efforts. She has agreed to the Category 2 Plan and the Brooke [name].   Exercise goals:  As is  Behavioral modification strategies: increasing lean protein intake, meal planning and cooking strategies, keeping healthy foods in the home, and planning for success.  Brooke [name] has agreed to follow-up with our clinic in 3 weeks. She was informed of the importance of frequent follow-up visits to maximize  her success with intensive lifestyle modifications for her multiple health conditions.  Objective:   Blood pressure 124/82, pulse 79, temperature 97.7 F (36.5 C), height 5\' 2"  (1.575 m), weight 182 lb (82.6 kg), SpO2 100 %. Body mass index is 33.29 kg/m.  General: Cooperative, alert, well developed, in no acute distress. HEENT:  Conjunctivae and lids unremarkable. Cardiovascular: Regular rhythm.  Lungs: Normal work of breathing. Neurologic: No focal deficits.   Lab Results  Component Value Date   CREATININE 0.80 04/22/2020   BUN 10 04/22/2020   NA 140 04/22/2020   K 3.6 04/22/2020   CL 110 04/22/2020   CO2 21 (L) 04/22/2020   Lab Results  Component Value Date   ALT 35 (H) 03/24/2020   AST 21 03/24/2020   ALKPHOS 74 03/24/2020   BILITOT 0.3 03/24/2020   Lab Results  Component Value Date   HGBA1C 5.1 03/24/2020   HGBA1C 5.2 09/14/2019   HGBA1C 5.3 08/05/2019   Lab Results  Component Value Date   INSULIN 5.1 03/24/2020   INSULIN 5.9 09/14/2019   Lab Results  Component Value Date   TSH 1.390 09/14/2019   Lab Results  Component Value Date   CHOL 193 03/24/2020   HDL 64 03/24/2020   LDLCALC 117 (H) 03/24/2020   TRIG 66 03/24/2020   CHOLHDL 3.0 03/24/2020   Lab Results  Component Value Date   WBC 4.8 04/22/2020   HGB 13.9 04/22/2020   HCT 43.0 04/22/2020   MCV 98.2 04/22/2020   PLT 357 04/22/2020   Lab Results  Component Value Date   IRON 111 09/03/2019   TIBC 292 09/03/2019   FERRITIN 187 09/03/2019   Attestation Statements:   Reviewed by clinician on day of visit: allergies, medications, problem list, medical history, surgical history, family history, social history, and previous encounter notes.   I, Wyatt Haste, LPN am acting as transcriptionist for Coralie Common, MD.  I have reviewed the above documentation for accuracy and completeness, and I agree with the above. - Jinny Blossom, MD

## 2020-06-15 ENCOUNTER — Encounter (HOSPITAL_COMMUNITY): Admission: RE | Payer: Self-pay | Source: Home / Self Care

## 2020-06-15 ENCOUNTER — Ambulatory Visit (HOSPITAL_COMMUNITY): Admission: RE | Admit: 2020-06-15 | Payer: 59 | Source: Home / Self Care | Admitting: Obstetrics & Gynecology

## 2020-06-15 SURGERY — HYSTERECTOMY, TOTAL, LAPAROSCOPIC, WITH SALPINGECTOMY
Anesthesia: General | Laterality: Bilateral

## 2020-07-06 ENCOUNTER — Other Ambulatory Visit: Payer: Self-pay

## 2020-07-06 ENCOUNTER — Encounter (INDEPENDENT_AMBULATORY_CARE_PROVIDER_SITE_OTHER): Payer: Self-pay | Admitting: Family Medicine

## 2020-07-06 ENCOUNTER — Ambulatory Visit (INDEPENDENT_AMBULATORY_CARE_PROVIDER_SITE_OTHER): Payer: 59 | Admitting: Family Medicine

## 2020-07-06 ENCOUNTER — Other Ambulatory Visit (INDEPENDENT_AMBULATORY_CARE_PROVIDER_SITE_OTHER): Payer: Self-pay | Admitting: Family Medicine

## 2020-07-06 VITALS — BP 113/79 | HR 70 | Temp 98.1°F | Ht 62.0 in | Wt 180.0 lb

## 2020-07-06 DIAGNOSIS — Z9189 Other specified personal risk factors, not elsewhere classified: Secondary | ICD-10-CM | POA: Diagnosis not present

## 2020-07-06 DIAGNOSIS — E8881 Metabolic syndrome: Secondary | ICD-10-CM | POA: Diagnosis not present

## 2020-07-06 DIAGNOSIS — Z6838 Body mass index (BMI) 38.0-38.9, adult: Secondary | ICD-10-CM

## 2020-07-06 DIAGNOSIS — E559 Vitamin D deficiency, unspecified: Secondary | ICD-10-CM

## 2020-07-06 DIAGNOSIS — E88819 Insulin resistance, unspecified: Secondary | ICD-10-CM

## 2020-07-06 DIAGNOSIS — E66812 Morbid (severe) obesity due to excess calories: Secondary | ICD-10-CM

## 2020-07-06 MED ORDER — TRULICITY 3 MG/0.5ML ~~LOC~~ SOAJ: 3.0000 mg | SUBCUTANEOUS | 0 refills | Status: DC

## 2020-07-07 ENCOUNTER — Ambulatory Visit (INDEPENDENT_AMBULATORY_CARE_PROVIDER_SITE_OTHER): Payer: 59 | Admitting: Nurse Practitioner

## 2020-07-07 ENCOUNTER — Encounter: Payer: Self-pay | Admitting: Nurse Practitioner

## 2020-07-07 VITALS — BP 128/82 | HR 85 | Temp 97.0°F | Ht 62.0 in | Wt 186.0 lb

## 2020-07-07 DIAGNOSIS — G4486 Cervicogenic headache: Secondary | ICD-10-CM

## 2020-07-07 MED ORDER — PREDNISONE 20 MG PO TABS: 20.0000 mg | ORAL_TABLET | Freq: Every day | ORAL | 0 refills | Status: DC

## 2020-07-07 MED ORDER — TIZANIDINE HCL 4 MG PO TABS: 4.0000 mg | ORAL_TABLET | Freq: Four times a day (QID) | ORAL | 0 refills | Status: DC | PRN

## 2020-07-07 NOTE — Assessment & Plan Note (Addendum)
-  has right neck pain and limited ROM; no MOI -Rx. Tizanidine and prednisone -she has diclofenac gel and tylenol at home; we discussed increasing tylenol to 650 mg TID -if no improvement will consider ortho referral -no PO NSAIDS d/t hx of gastric bypass

## 2020-07-07 NOTE — Progress Notes (Signed)
Acute Office Visit  Subjective:    Patient ID: Brooke [name], female    DOB: May 30, 1970, 50 y.o.   MRN: [id number]  Chief Complaint  Patient presents with   Headache    Ongoing for 2 months mainly on the R side, goes down into neck on the R side. Cannot turn her head to the R.   Muscle Pain    Body aches all over almost daily, ongoing for awhile now.     Headache  Associated symptoms include neck pain. Pertinent negatives include no dizziness or weakness.  Muscle Pain Associated symptoms include headaches. Pertinent negatives include no weakness.  Patient is in today for headache x 2 months. She has pain in her neck with rotation.  She rates her pain at 9/10. She is unable to turn her head to the right d/t pain.  Her pain is on her right side of her  head and neck. The pain radiates down her right shoulder and arm.  She has hx of left neck mass, and [place] on 05/20/20 was negative for malignancy.  Past Medical History:  Diagnosis Date   Acid reflux    Amenorrhea 02/06/2012   Anxiety    Arthritis    [name] 10/13/2019   B12 deficiency    Breast lump 08/06/2019   Cervical radiculitis    Chest pain    Chronic constipation    DDD (degenerative disc disease), lumbar    Depression    Depression    [name] 10/13/2019   Dizzy spells    Elevated vitamin B12 level 05/04/2019   Esophageal dysphagia 11/19/2012   Facial numbness    Fatty liver    GERD (gastroesophageal reflux disease)    [name] 10/13/2019   Headache(784.0) 04/01/2012   Heart palpitations 01/2017   High cholesterol    History of anemia    History of hiatal hernia    Hypertension    Insomnia    Intractable migraine with visual aura and without status migrainosus 01/23/2017   Iron deficiency anemia    Irregular periods 08/06/2019   Joint pain    Lactose intolerance    LUQ pain 11/19/2012   Menopausal symptom 08/06/2019   Migraines    occ   Near syncope 01/2017   Numbness and tingling 10/08/2016   Formatting of  this note might be different from the original. ---Oct 2018-TEE----Normal left ventricular size and systolic function with no appreciable segmental abnormality. EF 60% There was no evidence of spontaneous echo contrast or thrombus in the left atrium or left atrial appendage. No significant valvular abnormalites noted Bubble study performed, this is negative.   Numbness and tingling in left arm    Obesity    Other malaise and fatigue 05/19/2012   Panic attacks    Sciatica    Seizures (HCC)    Slurred speech 11/07/2016   Formatting of this note might be different from the original. ---Oct 2018-TEE----Normal left ventricular size and systolic function with no appreciable segmental abnormality. EF 60% There was no evidence of spontaneous echo contrast or thrombus in the left atrium or left atrial appendage. No significant valvular abnormalites noted Bubble study performed, this is negative.   Small bowel obstruction (HCC) 07/20/2017   Spells of speech arrest 01/23/2017   Transient cerebral ischemia 10/08/2016   Formatting of this note might be different from the original. ---Oct 2018-TEE----Normal left ventricular size and systolic function with no appreciable segmental abnormality. EF 60% There was no evidence of spontaneous echo contrast  or thrombus in the left atrium or left atrial appendage. No significant valvular abnormalites noted Bubble study performed, this is negative.   Vitamin D deficiency    Word finding difficulty 01/23/2017    Past Surgical History:  Procedure Laterality Date   BUNIONECTOMY Left yrs ago   CERVICAL ABLATION  2017   COLONOSCOPY, ESOPHAGOGASTRODUODENOSCOPY (EGD) AND ESOPHAGEAL DILATION N/A 12/03/2012   PPJ:KDTOIZTI melanosis throughout the entire examined colon/The colon IS redundant/Small internal hemorrhoids/EGD:Esophageal web/Medium sized hiatal hernia/MILD Non-erosive gastritis   ESOPHAGOGASTRODUODENOSCOPY  03/09/09   Dr. Wilford Corner, normal EGD, s/p Bravo capsule  placement   ESOPHAGOGASTRODUODENOSCOPY N/A 06/24/2019   rourk: Status post gastric bypass procedure, normal esophagus status post dilation   EYE SURGERY N/A    [name] 10/13/2019   GASTRIC ROUX-EN-Y N/A 07/16/2017   Procedure: LAPAROSCOPIC ROUX-EN-Y GASTRIC BYPASS WITH UPPER ENDOSCOPY AND ERAS PATHWAY;  Surgeon: Johnathan Hausen, MD;  Location: [facility];  Service: General;  Laterality: N/A;   LAPAROSCOPY N/A 07/20/2017   Procedure: LAPAROSCOPY DIAGNOSTIC. REDUCTION OF SMALL BOWEL OBSTRUCTION. REPAIR OF TROCAR HERNIA.;  Surgeon: Alphonsa Overall, MD;  Location: [facility];  Service: General;  Laterality: N/A;   MALONEY DILATION N/A 06/24/2019   Procedure: Keturah Shavers;  Surgeon: Daneil Dolin, MD;  Location: [facility];  Service: Endoscopy;  Laterality: N/A;   TUBAL LIGATION     WISDOM TOOTH EXTRACTION      Family History  Problem Relation Age of Onset   Diabetes Mother    Hypertension Mother    Drug abuse Mother    Anxiety disorder Mother    Depression Mother    CAD Mother        CABG in 39s   Lung cancer Mother    Hyperlipidemia Mother    Heart disease Mother    Cancer Mother    Obesity Mother    Hypertension Father    Sudden death Father    Diabetes Sister    Hypertension Sister    Hypertension Brother    Drug abuse Brother    CAD Brother        s/p CABG in 71s   Diabetes Paternal Grandmother    Hypertension Brother    Drug abuse Brother    CAD Brother        "HEart artery blockages" in 30s   Hypertension Brother    Drug abuse Brother    Anxiety disorder Maternal Grandmother    Depression Maternal Grandmother    Breast cancer Maternal Grandmother        breast   Asthma Other    Heart disease Other    Colon cancer Neg Hx    Gastric cancer Neg Hx    Esophageal cancer Neg Hx     Social History   Socioeconomic History   Marital status: Married    Spouse name: Randall Hiss    Number of children: 2   Years of education: Not on file   Highest education level: Some  college, no degree  Occupational History   Occupation: stay at home  Tobacco Use   Smoking status: Former    Packs/day: 0.30    Years: 23.00    Pack years: 6.90    Types: Cigarettes    Quit date: 10/04/2012    Years since quitting: 7.7   Smokeless tobacco: Never   Tobacco comments:    less than 1/2 pack cigarettes daily  Vaping Use   Vaping Use: Never used  Substance and Sexual Activity   Alcohol use:  Yes    Comment: weekends; hardly/social   Drug use: No   Sexual activity: Yes    Partners: Male    Birth control/protection: Surgical  Other Topics Concern   Not on file  Social History Narrative   Lives with husband, married 39 years    3 son Teron    66 son Jiles Harold -two grandchildren    Live close by    Rising cousin-custody of her daughter 33 [address]       Right handed   Pets: none      Enjoys: [employer], shopping, likes being outside       Diet: eggs, oatmeal, salad, all food groups no lot of proteins, good on veggies.    Caffeine: sweet tea-2 cups  Coffee-1 cup daily    Water: 2-3 16 oz bottles daily       Wears seat belt    Smoke and carbon monoxide detectors   Does use phone while driving but hands free   Social Determinants of Health   Financial Resource Strain: Not on file  Food Insecurity: Not on file  Transportation Needs: Not on file  Physical Activity: Not on file  Stress: Not on file  Social Connections: Not on file  Intimate Partner Violence: Not on file    Outpatient Medications Prior to Visit  Medication Sig Dispense Refill   acetaminophen (TYLENOL) 500 MG tablet Take 1,000 mg by mouth daily as needed for moderate pain or headache.     Biotin 10000 MCG TABS Take 5,000 mcg by mouth every other day.      calcium carbonate (OSCAL) 1500 (600 Ca) MG TABS tablet Take 600 mg by mouth daily.     diclofenac Sodium (VOLTAREN) 1 % GEL Apply 1 g topically 3 (three) times daily as needed for pain.     Dulaglutide (TRULICITY) 3 YQ/6.5HQ SOPN Inject 3 mg as  directed once a week. 2 mL 0   fluticasone (FLONASE) 50 MCG/ACT nasal spray Place 2 sprays into both nostrils daily. (Patient taking differently: Place 2 sprays into both nostrils daily. As needed) 16 g 1   levETIRAcetam (KEPPRA) 250 MG tablet Take 250 mg by mouth at bedtime.     levocetirizine (XYZAL) 5 MG tablet Take 1 tablet (5 mg total) by mouth every evening. (Patient taking differently: Take 5 mg by mouth daily as needed for allergies.) 30 tablet 2   Multiple Vitamins-Minerals (BARIATRIC MULTIVITAMINS/IRON PO) Take 1 tablet by mouth daily.      ondansetron (ZOFRAN) 4 MG tablet Take 1 tablet (4 mg total) by mouth every 8 (eight) hours as needed for nausea or vomiting. 12 tablet 0   pantoprazole (PROTONIX) 40 MG tablet Take 1 tablet (40 mg total) by mouth 2 (two) times daily before a meal. 180 tablet 3   QUEtiapine (SEROQUEL) 25 MG tablet Take 1 tablet (25 mg total) by mouth at bedtime. 90 tablet 0   topiramate (TOPAMAX) 100 MG tablet Take 100 mg by mouth 2 (two) times daily.      venlafaxine XR (EFFEXOR-XR) 150 MG 24 hr capsule Total of 225 mg along with 75 mg tab (Patient taking differently: Take 150 mg by mouth See admin instructions. Total of 225 mg along with 75 mg tab daily) 90 capsule 1   venlafaxine XR (EFFEXOR-XR) 75 MG 24 hr capsule 225 mg daily. Take along with 150 mg cap (Patient taking differently: Take 75 mg by mouth See admin instructions. 225 mg daily. Take along with 150 mg  cap) 90 capsule 1   Vitamin D, Ergocalciferol, (DRISDOL) 1.25 MG (50000 UNIT) CAPS capsule Take 1 capsule (50,000 Units total) by mouth every 7 (seven) days. 12 capsule 1   cyclobenzaprine (FLEXERIL) 5 MG tablet Take 1 tablet (5 mg total) by mouth 3 (three) times daily as needed for muscle spasms. (Patient not taking: Reported on 07/07/2020) 30 tablet 1   No facility-administered medications prior to visit.    No Known Allergies  Review of Systems  Constitutional: Negative.   Respiratory: Negative.     Cardiovascular: Negative.   Musculoskeletal:  Positive for neck pain.  Neurological:  Positive for headaches. Negative for dizziness, tremors, facial asymmetry and weakness.      Objective:    Physical Exam Constitutional:      General: She is not in acute distress.    Appearance: Normal appearance. She is not ill-appearing.  Cardiovascular:     Rate and Rhythm: Normal rate and regular rhythm.     Pulses: Normal pulses.     Heart sounds: Normal heart sounds.  Pulmonary:     Effort: Pulmonary effort is normal.     Breath sounds: Normal breath sounds.  Musculoskeletal:        General: Tenderness present.     Comments: Right neck tenderness and limited ROM  Neurological:     General: No focal deficit present.     Mental Status: She is alert and oriented to person, place, and time.     Cranial Nerves: No cranial nerve deficit.     Motor: No weakness.    BP 128/82 (BP Location: Left Arm, Patient Position: Sitting, Cuff Size: Normal)   Pulse 85   Temp (!) 97 F (36.1 C) (Temporal)   Ht $R'5\' 2"'sa$  (1.575 m)   Wt 186 lb (84.4 kg)   LMP 06/01/2020 (Approximate)   SpO2 95%   BMI 34.02 kg/m  Wt Readings from Last 3 Encounters:  07/07/20 186 lb (84.4 kg)  07/06/20 180 lb (81.6 kg)  06/09/20 182 lb (82.6 kg)    There are no preventive care reminders to display for this patient.   There are no preventive care reminders to display for this patient.   Lab Results  Component Value Date   TSH 1.390 09/14/2019   Lab Results  Component Value Date   WBC 4.8 04/22/2020   HGB 13.9 04/22/2020   HCT 43.0 04/22/2020   MCV 98.2 04/22/2020   PLT 357 04/22/2020   Lab Results  Component Value Date   NA 140 04/22/2020   K 3.6 04/22/2020   CO2 21 (L) 04/22/2020   GLUCOSE 80 04/22/2020   BUN 10 04/22/2020   CREATININE 0.80 04/22/2020   BILITOT 0.3 03/24/2020   ALKPHOS 74 03/24/2020   AST 21 03/24/2020   ALT 35 (H) 03/24/2020   PROT 6.3 03/24/2020   ALBUMIN 4.0 03/24/2020    CALCIUM 8.5 (L) 04/22/2020   ANIONGAP 9 04/22/2020   EGFR 90 03/24/2020   Lab Results  Component Value Date   CHOL 193 03/24/2020   Lab Results  Component Value Date   HDL 64 03/24/2020   Lab Results  Component Value Date   LDLCALC 117 (H) 03/24/2020   Lab Results  Component Value Date   TRIG 66 03/24/2020   Lab Results  Component Value Date   CHOLHDL 3.0 03/24/2020   Lab Results  Component Value Date   HGBA1C 5.1 03/24/2020       Assessment & Plan:   Problem  List Items Addressed This Visit       Other   Cervicogenic headache - Primary    -has right neck pain and limited ROM; no MOI -Rx. Tizanidine and prednisone -she has diclofenac gel and tylenol at home; we discussed increasing tylenol to 650 mg TID -if no improvement will consider ortho referral -no PO NSAIDS d/t hx of gastric bypass       Relevant Medications   predniSONE (DELTASONE) 20 MG tablet   tiZANidine (ZANAFLEX) 4 MG tablet     Meds ordered this encounter  Medications   predniSONE (DELTASONE) 20 MG tablet    Sig: Take 1 tablet (20 mg total) by mouth daily with breakfast.    Dispense:  10 tablet    Refill:  0   tiZANidine (ZANAFLEX) 4 MG tablet    Sig: Take 1 tablet (4 mg total) by mouth every 6 (six) hours as needed for muscle spasms.    Dispense:  30 tablet    Refill:  0      Noreene Larsson, NP

## 2020-07-07 NOTE — Patient Instructions (Signed)
If no improvement in 1 week, return to clinic and we will consider a referral at that time.  Go to the emergency department if you have numbness, difficulty speaking, or any other neurological complications.

## 2020-07-11 NOTE — Progress Notes (Signed)
Chief Complaint:   OBESITY Brooke [name] is here to discuss her progress with her obesity treatment plan along with follow-up of her obesity related diagnoses. Brooke [name] is on the Category 2 Plan and the Brooke [name] and states she is following her eating plan approximately 95% of the time. Brooke [name] states she is walking and band-it 30-45 minutes 3-5 times per week.  Today's visit was #: 33 Starting weight: 217 lbs Starting date: 09/14/2019 Today's weight: 180 lbs Today's date: 07/06/2020 Total lbs lost to date: 37 Total lbs lost since last in-office visit: 2  Interim History: Brooke [name] had a good 4th of July. She has been doing almost all Pescatarian plan due to heat. She is choosing one option more frequently that others. She wants to continue plan.  Subjective:   1. Insulin resistance Brooke [name] is doing well on Trulicity 3 mg. She reports occasional GI side effects.  2. Vitamin D deficiency Brooke [name] denies nausea, vomiting, and muscle weakness but notes fatigue. Pt is on prescription Vit D.  3. At risk for side effect of medication Brooke [name] is at risk for side effects of medication due to being on 3 mg of Trulicity.  Assessment/Plan:   1. Insulin resistance Brooke [name] will continue to work on weight loss, exercise, and decreasing simple carbohydrates to help decrease the risk of diabetes. Brooke [name] agreed to follow-up with [place] as directed to closely monitor her progress.  Refill- Dulaglutide (TRULICITY) 3 OV/7.8HY SOPN; Inject 3 mg as directed once a week.  Dispense: 2 mL; Refill: 0  2. Vitamin D deficiency Low Vitamin D level contributes to fatigue and are associated with obesity, breast, and colon cancer. She agrees to continue to take prescription Vitamin D @50 ,000 IU every week and will follow-up for routine testing of Vitamin D, at least 2-3 times per year to avoid over-replacement.  3. At risk for side effect of medication Brooke [name] was given approximately 15 minutes of drug side effect  counseling today.  We discussed side effect possibility and risk versus benefits. Brooke [name] agreed to the medication and will contact this office if these side effects are intolerable.  Repetitive spaced learning was employed today to elicit superior memory formation and behavioral change.   4. Class 2 severe obesity with serious comorbidity and body mass index (BMI) of 38.0 to 38.9 in adult, unspecified obesity type (HCC)  Brooke [name] is currently in the action stage of change. As such, her goal is to continue with weight loss efforts. She has agreed to the [employer].   Exercise goals:  As is- increase resistance training to 3-4 times a week.  Behavioral modification strategies: increasing lean protein intake, meal planning and cooking strategies, keeping healthy foods in the home, and planning for success.  Brooke [name] has agreed to follow-up with our clinic in 3-4 weeks. She was informed of the importance of frequent follow-up visits to maximize her success with intensive lifestyle modifications for her multiple health conditions.   Objective:   Blood pressure 113/79, pulse 70, temperature 98.1 F (36.7 C), height 5\' 2"  (1.575 m), weight 180 lb (81.6 kg), SpO2 100 %. Body mass index is 32.92 kg/m.  General: Cooperative, alert, well developed, in no acute distress. HEENT: Conjunctivae and lids unremarkable. Cardiovascular: Regular rhythm.  Lungs: Normal work of breathing. Neurologic: No focal deficits.   Lab Results  Component Value Date   CREATININE 0.80 04/22/2020   BUN 10 04/22/2020   NA 140 04/22/2020   K 3.6 04/22/2020   CL 110 04/22/2020  CO2 21 (L) 04/22/2020   Lab Results  Component Value Date   ALT 35 (H) 03/24/2020   AST 21 03/24/2020   ALKPHOS 74 03/24/2020   BILITOT 0.3 03/24/2020   Lab Results  Component Value Date   HGBA1C 5.1 03/24/2020   HGBA1C 5.2 09/14/2019   HGBA1C 5.3 08/05/2019   Lab Results  Component Value Date   INSULIN 5.1 03/24/2020   INSULIN  5.9 09/14/2019   Lab Results  Component Value Date   TSH 1.390 09/14/2019   Lab Results  Component Value Date   CHOL 193 03/24/2020   HDL 64 03/24/2020   LDLCALC 117 (H) 03/24/2020   TRIG 66 03/24/2020   CHOLHDL 3.0 03/24/2020   Lab Results  Component Value Date   VD25OH 56.4 03/24/2020   VD25OH 41.8 09/14/2019   VD25OH 28 (L) 08/05/2019   Lab Results  Component Value Date   WBC 4.8 04/22/2020   HGB 13.9 04/22/2020   HCT 43.0 04/22/2020   MCV 98.2 04/22/2020   PLT 357 04/22/2020   Lab Results  Component Value Date   IRON 111 09/03/2019   TIBC 292 09/03/2019   FERRITIN 187 09/03/2019    Attestation Statements:   Reviewed by clinician on day of visit: allergies, medications, problem list, medical history, surgical history, family history, social history, and previous encounter notes.  Coral Ceo, CMA, am acting as transcriptionist for Coralie Common, MD.  I have reviewed the above documentation for accuracy and completeness, and I agree with the above. - Jinny Blossom, MD

## 2020-07-21 ENCOUNTER — Emergency Department (HOSPITAL_COMMUNITY): Payer: 59

## 2020-07-21 ENCOUNTER — Emergency Department (HOSPITAL_COMMUNITY)
Admission: EM | Admit: 2020-07-21 | Discharge: 2020-07-21 | Disposition: A | Discharge status: Home / Self Care | Payer: 59 | Attending: Emergency Medicine | Admitting: Emergency Medicine

## 2020-07-21 ENCOUNTER — Other Ambulatory Visit: Payer: Self-pay

## 2020-07-21 DIAGNOSIS — I10 Essential (primary) hypertension: Secondary | ICD-10-CM | POA: Insufficient documentation

## 2020-07-21 DIAGNOSIS — S4991XA Unspecified injury of right shoulder and upper arm, initial encounter: Secondary | ICD-10-CM | POA: Diagnosis present

## 2020-07-21 DIAGNOSIS — R519 Headache, unspecified: Secondary | ICD-10-CM | POA: Diagnosis not present

## 2020-07-21 DIAGNOSIS — S46811A Strain of other muscles, fascia and tendons at shoulder and upper arm level, right arm, initial encounter: Secondary | ICD-10-CM | POA: Insufficient documentation

## 2020-07-21 DIAGNOSIS — Z87891 Personal history of nicotine dependence: Secondary | ICD-10-CM | POA: Diagnosis not present

## 2020-07-21 DIAGNOSIS — X58XXXA Exposure to other specified factors, initial encounter: Secondary | ICD-10-CM | POA: Insufficient documentation

## 2020-07-21 MED ORDER — BUPIVACAINE HCL (PF) 0.5 % IJ SOLN
10.0000 mL | Freq: Once | INTRAMUSCULAR | Status: AC
  Administered 2020-07-21: 10 mL
  Filled 2020-07-21: qty 30

## 2020-07-21 MED ORDER — ACETAMINOPHEN 500 MG PO TABS
1000.0000 mg | ORAL_TABLET | Freq: Once | ORAL | Status: AC
  Administered 2020-07-21: 1000 mg via ORAL
  Filled 2020-07-21: qty 2

## 2020-07-21 MED ORDER — DIAZEPAM 5 MG PO TABS: 5.0000 mg | ORAL_TABLET | Freq: Four times a day (QID) | ORAL | 0 refills | Status: DC | PRN

## 2020-07-21 MED ORDER — DIAZEPAM 5 MG PO TABS
5.0000 mg | ORAL_TABLET | Freq: Once | ORAL | Status: AC
  Administered 2020-07-21: 5 mg via ORAL
  Filled 2020-07-21: qty 1

## 2020-07-21 NOTE — ED Notes (Signed)
ED Provider at bedside. 

## 2020-07-21 NOTE — ED Triage Notes (Signed)
Pts has neck and back pain for over 3 months, has taken Prednisone and has no relief.  Pt says she cannot sleep at night and cannot turn her head.  Pt alert and oriented denies trauma

## 2020-07-21 NOTE — ED Notes (Signed)
Patient transported to X-ray 

## 2020-07-21 NOTE — ED Provider Notes (Signed)
[facility] EMERGENCY DEPARTMENT Provider Note   CSN: [id number] Arrival date & time: 07/21/20  [id number]     History Chief Complaint  Patient presents with   Back Pain    Neck and back pain    Brooke [name] is a 50 y.o. female.  Patient with acid reflux, obesity surgery history, depression, headache, anemia presents with persistent right neck pain and headache for the past 3 months.  Patient saw primary doctor given steroids and muscle relaxant with no significant improvement.  Patient denies any weakness or numbness in her arms or legs.  No fevers or chills.  No injuries recalled.  Worse with movement.      Past Medical History:  Diagnosis Date   Acid reflux    Amenorrhea 02/06/2012   Anxiety    Arthritis    [name] 10/13/2019   B12 deficiency    Breast lump 08/06/2019   Cervical radiculitis    Chest pain    Chronic constipation    DDD (degenerative disc disease), lumbar    Depression    Depression    [name] 10/13/2019   Dizzy spells    Elevated vitamin B12 level 05/04/2019   Esophageal dysphagia 11/19/2012   Facial numbness    Fatty liver    GERD (gastroesophageal reflux disease)    [name] 10/13/2019   Headache(784.0) 04/01/2012   Heart palpitations 01/2017   High cholesterol    History of anemia    History of hiatal hernia    Hypertension    Insomnia    Intractable migraine with visual aura and without status migrainosus 01/23/2017   Iron deficiency anemia    Irregular periods 08/06/2019   Joint pain    Lactose intolerance    LUQ pain 11/19/2012   Menopausal symptom 08/06/2019   Migraines    occ   Near syncope 01/2017   Numbness and tingling 10/08/2016   Formatting of this note might be different from the original. ---Oct 2018-TEE----Normal left ventricular size and systolic function with no appreciable segmental abnormality. EF 60% There was no evidence of spontaneous echo contrast or thrombus in the left atrium or left atrial appendage. No significant valvular  abnormalites noted Bubble study performed, this is negative.   Numbness and tingling in left arm    Obesity    Other malaise and fatigue 05/19/2012   Panic attacks    Sciatica    Seizures ([place])    Slurred speech 11/07/2016   Formatting of this note might be different from the original. ---Oct 2018-TEE----Normal left ventricular size and systolic function with no appreciable segmental abnormality. EF 60% There was no evidence of spontaneous echo contrast or thrombus in the left atrium or left atrial appendage. No significant valvular abnormalites noted Bubble study performed, this is negative.   Small bowel obstruction ([place]) 07/20/2017   Spells of speech arrest 01/23/2017   Transient cerebral ischemia 10/08/2016   Formatting of this note might be different from the original. ---Oct 2018-TEE----Normal left ventricular size and systolic function with no appreciable segmental abnormality. EF 60% There was no evidence of spontaneous echo contrast or thrombus in the left atrium or left atrial appendage. No significant valvular abnormalites noted Bubble study performed, this is negative.   Vitamin D deficiency    Word finding difficulty 01/23/2017    Patient Active Problem List   Diagnosis Date Noted   Cervicogenic headache 07/07/2020   Other hyperlipidemia 03/24/2020   Insulin resistance 03/08/2020   Anxiety 03/08/2020   Left shoulder pain  02/22/2020   Immunization due 02/22/2020   Dysphagia 01/28/2020   Bloating 01/28/2020   Nausea without vomiting 01/28/2020   Abdominal pain 12/02/2019   Raynaud's phenomenon without gangrene 10/22/2019   Fatigue 10/22/2019   Need for immunization against influenza 10/22/2019   Family history of systemic lupus erythematosus 10/22/2019   Environmental and seasonal allergies 10/14/2019   Cough 10/14/2019   Generalized anxiety disorder with panic attacks 08/20/2019   Arthritis 08/06/2019   Back problem 08/06/2019   Severe carpal tunnel syndrome of both wrists  08/06/2019   Acute cystitis without hematuria 08/06/2019   Elevated liver enzymes 08/06/2019   Vitamin D deficiency 05/04/2019   Preventative health care 02/05/2019   Mixed hyperglyceridemia 01/23/2019   Precordial pain 01/23/2019   Mixed obsessional thoughts and acts 12/12/2018   Seizure disorder ([place]) 10/13/2018   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 07/16/2017   Hypersomnia due to another medical condition 06/12/2017   Class 2 severe obesity with serious comorbidity and body mass index (BMI) of 39.0 to 39.9 in adult ([place]) 01/23/2017   Sleep deprivation 01/23/2017   Vertigo 10/08/2016   Blurred vision 10/08/2016   Chronic migraine 10/08/2016   Seizures ([place]) 09/25/2016   Morbid obesity ([place]) 12/03/2012   Depression, major, single episode, moderate ([place]) 02/06/2012   Panic attacks 02/06/2012   NICOTINE ADDICTION 03/04/2009   GERD 03/04/2009   Constipation 03/04/2009    Past Surgical History:  Procedure Laterality Date   BUNIONECTOMY Left yrs ago   CERVICAL ABLATION  2017   COLONOSCOPY, ESOPHAGOGASTRODUODENOSCOPY (EGD) AND ESOPHAGEAL DILATION N/A 12/03/2012   [id number] melanosis throughout the entire examined colon/The colon IS redundant/Small internal hemorrhoids/EGD:Esophageal web/Medium sized hiatal hernia/MILD Non-erosive gastritis   ESOPHAGOGASTRODUODENOSCOPY  03/09/09   Dr. Wilford Corner, normal EGD, s/p Bravo capsule placement   ESOPHAGOGASTRODUODENOSCOPY N/A 06/24/2019   rourk: Status post gastric bypass procedure, normal esophagus status post dilation   EYE SURGERY N/A    [name] 10/13/2019   GASTRIC ROUX-EN-Y N/A 07/16/2017   Procedure: LAPAROSCOPIC ROUX-EN-Y GASTRIC BYPASS WITH UPPER ENDOSCOPY AND ERAS PATHWAY;  Surgeon: Johnathan Hausen, MD;  Location: [facility] ORS;  Service: General;  Laterality: N/A;   LAPAROSCOPY N/A 07/20/2017   Procedure: LAPAROSCOPY DIAGNOSTIC. REDUCTION OF SMALL BOWEL OBSTRUCTION. REPAIR OF TROCAR HERNIA.;  Surgeon: Alphonsa Overall, MD;  Location:  [facility] ORS;  Service: General;  Laterality: N/A;   MALONEY DILATION N/A 06/24/2019   Procedure: Keturah Shavers;  Surgeon: Daneil Dolin, MD;  Location: [facility] END[facility] SUITE;  Service: Endoscopy;  Laterality: N/A;   TUBAL LIGATION     WISDOM TOOTH EXTRACTION       OB History     Gravida  3   Para      Term      Preterm      AB  1   Living  2      SAB      IAB      Ectopic      Multiple      Live Births  2           Family History  Problem Relation Age of Onset   Diabetes Mother    Hypertension Mother    Drug abuse Mother    Anxiety disorder Mother    Depression Mother    CAD Mother        CABG in 75s   Lung cancer Mother    Hyperlipidemia Mother    Heart disease Mother    Cancer  Mother    Obesity Mother    Hypertension Father    Sudden death Father    Diabetes Sister    Hypertension Sister    Hypertension Brother    Drug abuse Brother    CAD Brother        s/p CABG in 68s   Diabetes Paternal Grandmother    Hypertension Brother    Drug abuse Brother    CAD Brother        "HEart artery blockages" in 44s   Hypertension Brother    Drug abuse Brother    Anxiety disorder Maternal Grandmother    Depression Maternal Grandmother    Breast cancer Maternal Grandmother        breast   Asthma Other    Heart disease Other    Colon cancer Neg Hx    Gastric cancer Neg Hx    Esophageal cancer Neg Hx     Social History   Tobacco Use   Smoking status: Former    Packs/day: 0.30    Years: 23.00    Pack years: 6.90    Types: Cigarettes    Quit date: 10/04/2012    Years since quitting: 7.8   Smokeless tobacco: Never   Tobacco comments:    less than 1/2 pack cigarettes daily  Vaping Use   Vaping Use: Never used  Substance Use Topics   Alcohol use: Yes    Comment: weekends; hardly/social   Drug use: No    Home Medications Prior to Admission medications   Medication Sig Start Date End Date Taking? Authorizing Provider  acetaminophen (TYLENOL) 500 MG  tablet Take 1,000 mg by mouth daily as needed for moderate pain or headache.    [provider]  Biotin 10000 MCG TABS Take 5,000 mcg by mouth every other day.     [provider]  calcium carbonate (OSCAL) 1500 (600 Ca) MG TABS tablet Take 600 mg by mouth daily.    [provider]  diazepam (VALIUM) 5 MG tablet Take 1 tablet (5 mg total) by mouth every 6 (six) hours as needed for muscle spasms. 07/21/20   Elnora Morrison, MD  diclofenac Sodium (VOLTAREN) 1 % GEL Apply 1 g topically 3 (three) times daily as needed for pain. 06/06/19   [provider]  Dulaglutide (TRULICITY) 3 0000000 SOPN Inject 3 mg as directed once a week. 07/06/20   Laqueta Linden, MD  fluticasone (FLONASE) 50 MCG/ACT nasal spray Place 2 sprays into both nostrils daily. Patient taking differently: Place 2 sprays into both nostrils daily. As needed 10/14/19   Perlie Mayo, NP  levETIRAcetam (KEPPRA) 250 MG tablet Take 250 mg by mouth at bedtime.    [provider]  levocetirizine (XYZAL) 5 MG tablet Take 1 tablet (5 mg total) by mouth every evening. Patient taking differently: Take 5 mg by mouth daily as needed for allergies. 10/14/19   Perlie Mayo, NP  Multiple Vitamins-Minerals (BARIATRIC MULTIVITAMINS/IRON PO) Take 1 tablet by mouth daily.     [provider]  ondansetron (ZOFRAN) 4 MG tablet Take 1 tablet (4 mg total) by mouth every 8 (eight) hours as needed for nausea or vomiting. 11/14/19   Marcello Fennel, PA-C  pantoprazole (PROTONIX) 40 MG tablet Take 1 tablet (40 mg total) by mouth 2 (two) times daily before a meal. 08/24/19   [name] Menghini, PA-C  predniSONE (DELTASONE) 20 MG tablet Take 1 tablet (20 mg total) by mouth daily with breakfast. 07/07/20  Noreene Larsson, NP  QUEtiapine (SEROQUEL) 25 MG tablet Take 1 tablet (25 mg total) by mouth at bedtime. 05/31/20 08/29/20  [name], MD  tiZANidine (ZANAFLEX) 4 MG tablet Take 1 tablet (4 mg total) by mouth  every 6 (six) hours as needed for muscle spasms. 07/07/20   Noreene Larsson, NP  topiramate (TOPAMAX) 100 MG tablet Take 100 mg by mouth 2 (two) times daily.     [provider]  venlafaxine XR (EFFEXOR-XR) 150 MG 24 hr capsule Total of 225 mg along with 75 mg tab Patient taking differently: Take 150 mg by mouth See admin instructions. Total of 225 mg along with 75 mg tab daily 03/21/20   [name], MD  venlafaxine XR (EFFEXOR-XR) 75 MG 24 hr capsule 225 mg daily. Take along with 150 mg cap Patient taking differently: Take 75 mg by mouth See admin instructions. 225 mg daily. Take along with 150 mg cap 05/09/20   Hisada, Elie Goody, MD  Vitamin D, Ergocalciferol, (DRISDOL) 1.25 MG (50000 UNIT) CAPS capsule Take 1 capsule (50,000 Units total) by mouth every 7 (seven) days. 06/09/20   Laqueta Linden, MD    Allergies    Patient has no known allergies.  Review of Systems   Review of Systems  Constitutional:  Negative for chills and fever.  HENT:  Negative for congestion.   Eyes:  Negative for visual disturbance.  Respiratory:  Negative for shortness of breath.   Cardiovascular:  Negative for chest pain.  Gastrointestinal:  Negative for abdominal pain and vomiting.  Genitourinary:  Negative for dysuria and flank pain.  Musculoskeletal:  Positive for neck pain. Negative for back pain and neck stiffness.  Skin:  Negative for rash.  Neurological:  Positive for headaches. Negative for light-headedness.   Physical Exam Updated Vital Signs BP 128/88 (BP Location: Left Arm)   Pulse 74   Temp (!) 97.2 F (36.2 C) (Tympanic)   Resp 16   Ht '5\' 2"'$  (1.575 m)   Wt 81.2 kg   SpO2 100%   BMI 32.74 kg/m   Physical Exam Vitals and nursing note reviewed.  Constitutional:      General: She is not in acute distress.    Appearance: She is well-developed.  HENT:     Head: Normocephalic and atraumatic.     Mouth/Throat:     Mouth: Mucous membranes are moist.  Eyes:     General:        Right  eye: No discharge.        Left eye: No discharge.     Conjunctiva/sclera: Conjunctivae normal.  Neck:     Trachea: No tracheal deviation.  Cardiovascular:     Rate and Rhythm: Normal rate.     Heart sounds: No murmur heard. Pulmonary:     Effort: Pulmonary effort is normal.  Abdominal:     General: There is no distension.     Palpations: Abdomen is soft.     Tenderness: There is no abdominal tenderness. There is no guarding.  Musculoskeletal:        General: Tenderness present. No swelling.     Cervical back: Normal range of motion and neck supple. No rigidity.     Comments: Patient has mild midline tenderness however primarily paracervical tenderness proximal C3 region and muscle spasm.  Decreased range of motion horizontally to the right due to pain and muscle spasm.  Skin:    General: Skin is warm.     Capillary Refill: Capillary refill  takes less than 2 seconds.     Findings: No rash.  Neurological:     General: No focal deficit present.     Mental Status: She is alert.     Cranial Nerves: No cranial nerve deficit.     Sensory: No sensory deficit.     Motor: No weakness.     Comments: Patient has normal strength flexion extension at major joints and sensation intact bilateral upper extremities to palpation in major nerves.  Psychiatric:        Mood and Affect: Mood normal.    ED Results / Procedures / Treatments   Labs (all labs ordered are listed, but only abnormal results are displayed) Labs Reviewed - No data to display  EKG None  Radiology No results found.  Procedures Procedures  Trigger point injection Discussed risks and benefits including damage to structures, pain, bleeding, infection.  Patient agrees to plan.  5 cc of bupivacaine utilized. Palpated maximum point of tenderness right paraspinal cervical/proximal trapezius.  Injection performed without difficulty. Patient tolerated well, improved symptoms after. Performed by myself. Medications Ordered in  ED Medications  acetaminophen (TYLENOL) tablet 1,000 mg (has no administration in time range)  diazepam (VALIUM) tablet 5 mg (has no administration in time range)  bupivacaine (MARCAINE) 0.5 % injection 10 mL (has no administration in time range)    ED Course  I have reviewed the triage vital signs and the nursing notes.  Pertinent labs & imaging results that were available during my care of the patient were reviewed by me and considered in my medical decision making (see chart for details).    MDM Rules/Calculators/A&P                           Patient presents with clinical concern for muscle spasm trapezius muscle, other differentials include radiculopathy, nerve impingement, other.  No signs of infection clinically.  No injuries.  Valium and Tylenol ordered, patient not able to have NSAIDs.  Plan for referral to orthopedics, Valium, and local injection trigger point performed.  X-rays reviewed no acute abnormalities..   Final Clinical Impression(s) / ED Diagnoses Final diagnoses:  Trapezius muscle strain, right, initial encounter    Rx / DC Orders ED Discharge Orders          Ordered    diazepam (VALIUM) 5 MG tablet  Every 6 hours PRN,   Status:  Discontinued        07/21/20 0909    diazepam (VALIUM) 5 MG tablet  Every 6 hours PRN        07/21/20 0910             Elnora Morrison, MD 07/22/20 1437

## 2020-07-21 NOTE — Discharge Instructions (Addendum)
Use Tylenol every 4 hours needed for pain.  Use ice and heat as tolerated as needed. Use Valium for muscle spasm if heat packs were not working remember it can make you tired and has addictive potential.  Follow-up with your primary doctor and orthopedics for further treatment options.

## 2020-07-21 NOTE — ED Notes (Signed)
Pt here with c/o right side head/ear/neck pain for weeks and saw doc last week with prescription for steroid and muscle relaxer, pt reports no change after steroids and did not get muscle relaxer prescription filled d/t lack of insurance coverage

## 2020-07-25 ENCOUNTER — Other Ambulatory Visit: Payer: Self-pay

## 2020-07-25 ENCOUNTER — Encounter (INDEPENDENT_AMBULATORY_CARE_PROVIDER_SITE_OTHER): Payer: Self-pay | Admitting: Family Medicine

## 2020-07-25 ENCOUNTER — Ambulatory Visit (INDEPENDENT_AMBULATORY_CARE_PROVIDER_SITE_OTHER): Payer: 59 | Admitting: Family Medicine

## 2020-07-25 VITALS — BP 110/73 | HR 73 | Temp 98.1°F | Ht 62.0 in | Wt 177.0 lb

## 2020-07-25 DIAGNOSIS — E559 Vitamin D deficiency, unspecified: Secondary | ICD-10-CM

## 2020-07-25 DIAGNOSIS — E88819 Insulin resistance, unspecified: Secondary | ICD-10-CM

## 2020-07-25 DIAGNOSIS — E8881 Metabolic syndrome: Secondary | ICD-10-CM | POA: Diagnosis not present

## 2020-07-25 DIAGNOSIS — Z6839 Body mass index (BMI) 39.0-39.9, adult: Secondary | ICD-10-CM | POA: Diagnosis not present

## 2020-07-25 DIAGNOSIS — Z9189 Other specified personal risk factors, not elsewhere classified: Secondary | ICD-10-CM | POA: Diagnosis not present

## 2020-07-25 MED ORDER — TRULICITY 3 MG/0.5ML ~~LOC~~ SOAJ: 3.0000 mg | SUBCUTANEOUS | 0 refills | Status: DC

## 2020-07-27 NOTE — Progress Notes (Signed)
Chief Complaint:   OBESITY Brooke [name] is here to discuss her progress with her obesity treatment plan along with follow-up of her obesity related diagnoses. Brooke [name] is on the [employer] and states she is following her eating plan approximately 100% of the time. Brooke [name] states she is walks 3 miles 3 times per week.  Today's visit was #: 14 Starting weight: 217 lbs Starting date: 09/14/2019 Today's weight: 177 lbs Today's date: 07/25/2020 Total lbs lost to date: 40 Total lbs lost since last in-office visit: 3  Interim History: Since her last appt, Brooke [name] has been walking 2.5-3 miles daily. She is eating Pescatarian 100% of the time. She denies hunger. The hardest part of the meal plan is figuring out what to eat. She eats a significant amount of vegetables. She is going to the beach for the weekend.  Subjective:   1. Insulin resistance Brooke [name] is on Trulicity 3 mg weekly. Her last A1c was 5.7 and insulin level 5.1.  2. Vitamin D deficiency Brooke [name] denies nausea, vomiting, and muscle weakness but notes fatigue. Pt is on prescription Vit D.  3. At risk for diabetes mellitus Brooke [name] is at higher than average risk for developing diabetes due to obesity.   Assessment/Plan:   1. Insulin resistance Brooke [name] will continue to work on weight loss, exercise, and decreasing simple carbohydrates to help decrease the risk of diabetes. Brooke [name] agreed to follow-up with [place] as directed to closely monitor her progress.  Refill- Dulaglutide (TRULICITY) 3 0000000 SOPN; Inject 3 mg as directed once a week.  Dispense: 2 mL; Refill: 0  2. Vitamin D deficiency Low Vitamin D level contributes to fatigue and are associated with obesity, breast, and colon cancer. She agrees to continue to take prescription Vitamin D '@50'$ ,000 IU every week and will follow-up for routine testing of Vitamin D, at least 2-3 times per year to avoid over-replacement.  3. At risk for diabetes mellitus Brooke [name] was given approximately  15 minutes of diabetes education and counseling today. We discussed intensive lifestyle modifications today with an emphasis on weight loss as well as increasing exercise and decreasing simple carbohydrates in her diet. We also reviewed medication options with an emphasis on risk versus benefit of those discussed.   Repetitive spaced learning was employed today to elicit superior memory formation and behavioral change.  4. Class 2 severe obesity with serious comorbidity and body mass index (BMI) of 39.0 to 39.9 in adult, unspecified obesity type (HCC)  Brooke [name] is currently in the action stage of change. As such, her goal is to continue with weight loss efforts. She has agreed to the [employer].   Exercise goals:  As is- add 10-15 minutes of resistance training 3 times a week.  Behavioral modification strategies: increasing lean protein intake, meal planning and cooking strategies, keeping healthy foods in the home, and planning for success.  Brooke [name] has agreed to follow-up with our clinic in 3 weeks. She was informed of the importance of frequent follow-up visits to maximize her success with intensive lifestyle modifications for her multiple health conditions.   Objective:   Blood pressure 110/73, pulse 73, temperature 98.1 F (36.7 C), height '5\' 2"'$  (1.575 m), weight 177 lb (80.3 kg), last menstrual period 07/07/2020, SpO2 100 %. Body mass index is 32.37 kg/m.  General: Cooperative, alert, well developed, in no acute distress. HEENT: Conjunctivae and lids unremarkable. Cardiovascular: Regular rhythm.  Lungs: Normal work of breathing. Neurologic: No focal deficits.   Lab Results  Component Value Date  CREATININE 0.80 04/22/2020   BUN 10 04/22/2020   NA 140 04/22/2020   K 3.6 04/22/2020   CL 110 04/22/2020   CO2 21 (L) 04/22/2020   Lab Results  Component Value Date   ALT 35 (H) 03/24/2020   AST 21 03/24/2020   ALKPHOS 74 03/24/2020   BILITOT 0.3 03/24/2020   Lab Results   Component Value Date   HGBA1C 5.1 03/24/2020   HGBA1C 5.2 09/14/2019   HGBA1C 5.3 08/05/2019   Lab Results  Component Value Date   INSULIN 5.1 03/24/2020   INSULIN 5.9 09/14/2019   Lab Results  Component Value Date   TSH 1.390 09/14/2019   Lab Results  Component Value Date   CHOL 193 03/24/2020   HDL 64 03/24/2020   LDLCALC 117 (H) 03/24/2020   TRIG 66 03/24/2020   CHOLHDL 3.0 03/24/2020   Lab Results  Component Value Date   VD25OH 56.4 03/24/2020   VD25OH 41.8 09/14/2019   VD25OH 28 (L) 08/05/2019   Lab Results  Component Value Date   WBC 4.8 04/22/2020   HGB 13.9 04/22/2020   HCT 43.0 04/22/2020   MCV 98.2 04/22/2020   PLT 357 04/22/2020   Lab Results  Component Value Date   IRON 111 09/03/2019   TIBC 292 09/03/2019   FERRITIN 187 09/03/2019    Attestation Statements:   Reviewed by clinician on day of visit: allergies, medications, problem list, medical history, surgical history, family history, social history, and previous encounter notes.  Coral Ceo, CMA, am acting as transcriptionist for Coralie Common, MD.   I have reviewed the above documentation for accuracy and completeness, and I agree with the above. - Coralie Common, MD

## 2020-08-15 ENCOUNTER — Ambulatory Visit (INDEPENDENT_AMBULATORY_CARE_PROVIDER_SITE_OTHER): Payer: 59 | Admitting: Family Medicine

## 2020-08-16 ENCOUNTER — Other Ambulatory Visit: Payer: Self-pay

## 2020-08-16 ENCOUNTER — Encounter: Payer: Self-pay | Admitting: Obstetrics & Gynecology

## 2020-08-16 ENCOUNTER — Ambulatory Visit (INDEPENDENT_AMBULATORY_CARE_PROVIDER_SITE_OTHER): Payer: 59 | Admitting: Obstetrics & Gynecology

## 2020-08-16 VITALS — BP 115/78 | HR 80 | Ht 62.0 in | Wt 180.2 lb

## 2020-08-16 DIAGNOSIS — N939 Abnormal uterine and vaginal bleeding, unspecified: Secondary | ICD-10-CM | POA: Diagnosis not present

## 2020-08-16 DIAGNOSIS — Z01818 Encounter for other preprocedural examination: Secondary | ICD-10-CM

## 2020-08-16 MED ORDER — OXYCODONE HCL 5 MG PO TABS
5.0000 mg | ORAL_TABLET | Freq: Four times a day (QID) | ORAL | 0 refills | Status: AC | PRN
Start: 2020-08-16 — End: 2020-08-23

## 2020-08-16 MED ORDER — DOCUSATE SODIUM 100 MG PO CAPS: 100.0000 mg | ORAL_CAPSULE | Freq: Two times a day (BID) | ORAL | 0 refills | Status: AC

## 2020-08-16 NOTE — Progress Notes (Signed)
GYN VISIT Patient name: CAYSE WILLOCK MRN [id number]  Date of birth: Sep 26, 1970 Chief Complaint:   Pre-op Exam  History of Present Illness:   Brooke [name] is a 50 y.o. [phone]  female being seen today for upcoming TLH, BS due to AUB.  AUB: Today she notes that her periods are still irregular.  Usually lasting 5- 7 days with moderate to heavy bleeding.  As previously discussed, she has tried conservative options and wishes to proceed with surgical intervention. Previously reported menses lasted for 20 days in April, stopped for 3 days then returned for a few more weeks.  Reports moderate bleeding, usually about 2 pads per day.  Prior to April, menses usually lasted for about 6 days.  Notes some dysmenorrhea, no change with tylenol.  +nausea, no vomiting.   Previously had endometrial ablation in 2016.    Records include: 04/2019- US/SHG showed 4cm intramural fibroid.  Inability to expand cavity.   [place] 03/2019: 8x5.5x7.7xm uterus, normal ovaries bilaterally.    Patient's last menstrual period was 08/07/2020.  Depression screen [facility] 2/9 07/07/2020 05/16/2020 02/22/2020 11/25/2019 11/16/2019  Decreased Interest 0 0 0 1 0  Down, Depressed, Hopeless 0 0 0 1 0  PHQ - 2 Score 0 0 0 2 0  Altered sleeping - - - 1 1  Tired, decreased energy - - - 1 1  Change in appetite - - - 1 1  Feeling bad or failure about yourself  - - - 1 0  Trouble concentrating - - - 1 0  Moving slowly or fidgety/restless - - - 0 0  Suicidal thoughts - - - 0 0  PHQ-9 Score - - - 7 3  Difficult doing work/chores - - - Not difficult at all Not difficult at all  Some encounter information is confidential and restricted. Go to Review Flowsheets activity to see all data.  Some recent data might be hidden     Review of Systems:   Pertinent items are noted in HPI Denies fever/chills, dizziness, headaches, visual disturbances, fatigue, shortness of breath, chest pain, abdominal pain, vomiting, see HPI regarding menses, bowel  movements, urination, or intercourse unless otherwise stated above.  Pertinent History Reviewed:  Reviewed past medical,surgical, social, obstetrical and family history.  PSHx: [id number], tubal ligation Reviewed problem list, medications and allergies. Physical Assessment:   Vitals:   08/16/20 1048  BP: 115/78  Pulse: 80  Weight: 180 lb 3.2 oz (81.7 kg)  Height: '5\' 2"'$  (1.575 m)  Body mass index is 32.96 kg/m.       Physical Examination:   General appearance: alert, well appearing, and in no distress  Psych: mood appropriate, normal affect  Skin: warm & dry   Cardiovascular: normal heart rate noted, RRR  Respiratory: normal respiratory effort, no distress, CTAB  Abdomen: soft, non-tender, prior incisions examined, uterus below umbilicus  Pelvic: deferred  Extremities: no edema, no calf tenderness bilaterally  Chaperone: N/A    Assessment & Plan:  1) AUB Reviewed upcoming surgery- TLH, BS- discussed risk/benefit including but not limited to risk of bleeding, infection and injury to surrounding organs. Discussed potential for mini-lap/open surgery pending findings.  Reviewed recovery and postop discharge.  Questions and concerns were addressed.  Desires to proceed -Postop medications sent in -Plan for same day discharge   No orders of the defined types were placed in this encounter.   Return in about 1 week (around 08/23/2020) for 1wk postop from 8/30.   Janyth Pupa, DO Attending Obstetrician &  Gynecologist, Product/process development scientist for [employer], [place]

## 2020-08-18 ENCOUNTER — Ambulatory Visit (INDEPENDENT_AMBULATORY_CARE_PROVIDER_SITE_OTHER): Payer: 59 | Admitting: Family Medicine

## 2020-08-18 ENCOUNTER — Encounter (INDEPENDENT_AMBULATORY_CARE_PROVIDER_SITE_OTHER): Payer: Self-pay
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
## 2020-08-22 ENCOUNTER — Encounter: Payer: Self-pay | Admitting: Psychiatry

## 2020-08-22 ENCOUNTER — Telehealth: Payer: Self-pay

## 2020-08-22 ENCOUNTER — Other Ambulatory Visit: Payer: Self-pay

## 2020-08-22 ENCOUNTER — Telehealth (INDEPENDENT_AMBULATORY_CARE_PROVIDER_SITE_OTHER): Payer: 59 | Admitting: Psychiatry

## 2020-08-22 DIAGNOSIS — F1020 Alcohol dependence, uncomplicated: Secondary | ICD-10-CM

## 2020-08-22 DIAGNOSIS — F331 Major depressive disorder, recurrent, moderate: Secondary | ICD-10-CM | POA: Diagnosis not present

## 2020-08-22 DIAGNOSIS — F410 Panic disorder [episodic paroxysmal anxiety] without agoraphobia: Secondary | ICD-10-CM | POA: Diagnosis not present

## 2020-08-22 MED ORDER — VENLAFAXINE HCL ER 150 MG PO CP24: ORAL_CAPSULE | ORAL | 1 refills | Status: DC

## 2020-08-22 MED ORDER — REXULTI 0.5 MG PO TABS: 0.5000 mg | ORAL_TABLET | Freq: Every day | ORAL | 1 refills | Status: DC

## 2020-08-22 NOTE — Telephone Encounter (Signed)
went online to submit the prior auth for the rexulti .'5mg'$  - prior Josem Kaufmann was submitted and approved from 08-22-20 to  08-21-21.  prior auth reference # [id number]

## 2020-08-22 NOTE — Telephone Encounter (Signed)
received fax that a prior auth was needed for the rexulti .'5mg'$ 

## 2020-08-22 NOTE — Patient Instructions (Addendum)
1. Continue venlafaxine 225 mg at night  2  Discontinue quetiapine 3. Start Rexulti 0.5 mg daily 4. Next appointment:  10/17 at 11 AM, video

## 2020-08-23 ENCOUNTER — Telehealth: Payer: Self-pay

## 2020-08-23 ENCOUNTER — Other Ambulatory Visit (INDEPENDENT_AMBULATORY_CARE_PROVIDER_SITE_OTHER): Payer: Self-pay | Admitting: Family Medicine

## 2020-08-23 DIAGNOSIS — E8881 Metabolic syndrome: Secondary | ICD-10-CM

## 2020-08-23 NOTE — Telephone Encounter (Signed)
received fax that the rexulti .'5mg'$  was approved from 08-22-20 to  08-21-2021

## 2020-08-24 NOTE — Telephone Encounter (Signed)
LAST APPOINTMENT DATE: 07/25/2020 NEXT APPOINTMENT DATE: 09/14/2020   [facility] [id number] - [place], [place] - [address] [place] [address] Phone: [phone] Fax: [phone]  Patient is requesting a refill of the following medications: Requested Prescriptions   Pending Prescriptions Disp Refills   TRULICITY 3 0000000 SOPN [Pharmacy Med Name: TRULICITY '3MG'$ /0.5ML SDP 0.5ML] 2 mL 0    Sig: INJECT 3 MG INTO THE SKIN ONCE A WEEK    Date last filled: 07/25/2020 Previously prescribed by Dr. Jearld Shines  Lab Results  Component Value Date   HGBA1C 5.1 03/24/2020   HGBA1C 5.2 09/14/2019   HGBA1C 5.3 08/05/2019   Lab Results  Component Value Date   LDLCALC 117 (H) 03/24/2020   CREATININE 0.80 04/22/2020   Lab Results  Component Value Date   VD25OH 56.4 03/24/2020   VD25OH 41.8 09/14/2019   VD25OH 28 (L) 08/05/2019    BP Readings from Last 3 Encounters:  08/16/20 115/78  07/25/20 110/73  07/21/20 128/72

## 2020-08-24 NOTE — Patient Instructions (Signed)
Brooke [name]  08/24/2020     '@[email or web address]'$ @   Your procedure is scheduled on 08/30/2020.   Report to [facility] at  [phone]  A.M.   Call this number if you have problems the morning of surgery:  [phone]   Remember:  Do not eat or drink after midnight.      Take these medicines the morning of surgery with A SIP OF WATER                 Rexulti, protonix. Topamax, effexor.     Do not wear jewelry, make-up or nail polish.  Do not wear lotions, powders, or perfumes, or deodorant.  Do not shave 48 hours prior to surgery.  Men may shave face and neck.  Do not bring valuables to the hospital.  [facility] is not responsible for any belongings or valuables.  Contacts, dentures or bridgework may not be worn into surgery.  Leave your suitcase in the car.  After surgery it may be brought to your room.  For patients admitted to the hospital, discharge time will be determined by your treatment team.  Patients discharged the day of surgery will not be allowed to drive home and must have someone with them for 24 hours.    Special instructions:   DO NOT smoke tobacco or vape for 24 hours before your procedure.  Please read over the following fact sheets that you were given. Coughing and Deep Breathing, Surgical Site Infection Prevention, Anesthesia Post-op Instructions, and Care and Recovery After Surgery      Total Laparoscopic Hysterectomy, Care After The following information offers guidance on how to care for yourself after your procedure. Your health care provider may also give you more specific instructions. If you have problems or questions, contact your health careprovider. What can I expect after the procedure? After the procedure, it is common to have: Pain, bruising, and numbness around your incisions. Tiredness (fatigue). Poor appetite. Less interest in sex. Vaginal discharge or bleeding. You will need to use a sanitary pad after this  procedure. Feelings of sadness or other emotions. If your ovaries were also removed, it is also common to have symptoms of menopause, such as hot flashes, night sweats, and lack of sleep (insomnia). Follow these instructions at home: Medicines Take over-the-counter and prescription medicines only as told by your health care provider. Ask your health care provider if the medicine prescribed to you: Requires you to avoid driving or using machinery. Can cause constipation. You may need to take these actions to prevent or treat constipation: Drink enough fluid to keep your urine pale yellow. Take over-the-counter or prescription medicines. Eat foods that are high in fiber, such as beans, whole grains, and fresh fruits and vegetables. Limit foods that are high in fat and processed sugars, such as fried or sweet foods. Incision care  Follow instructions from your health care provider about how to take care of your incisions. Make sure you: Wash your hands with soap and water for at least 20 seconds before and after you change your bandage (dressing). If soap and water are not available, use hand sanitizer. Change your dressing as told by your health care provider. Leave stitches (sutures), skin glue, or adhesive strips in place. These skin closures may need to stay in place for 2 weeks or longer. If adhesive strip edges start to loosen and curl up, you may trim the loose edges. Do not remove adhesive  strips completely unless your health care provider tells you to do that. Check your incision areas every day for signs of infection. Check for: More redness, swelling, or pain. Fluid or blood. Warmth. Pus or a bad smell.  Activity  Rest as told by your health care provider. Avoid sitting for a long time without moving. Get up to take short walks every 1-2 hours. This is important to improve blood flow and breathing. Ask for help if you feel weak or unsteady. Return to your normal activities as  told by your health care provider. Ask your health care provider what activities are safe for you. Do not lift anything that is heavier than 10 lb (4.5 kg), or the limit that you are told, for one month after surgery or until your health care provider says that it is safe. If you were given a sedative during the procedure, it can affect you for several hours. Do not drive or operate machinery until your health care provider says that it is safe.  Lifestyle Do not use any products that contain nicotine or tobacco. These products include cigarettes, chewing tobacco, and vaping devices, such as e-cigarettes. These can delay healing after surgery. If you need help quitting, ask your health care provider. Do not drink alcohol until your health care provider approves. General instructions  Do not douche, use tampons, or have sex for at least 6 weeks, or as told by your health care provider. If you struggle with physical or emotional changes after your procedure, speak with your health care provider or a therapist. Do not take baths, swim, or use a hot tub until your health care provider approves. You may only be allowed to take showers for 2-3 weeks. Keep your dressing dry until your health care provider says it can be removed. Try to have someone at home with you for the first 1-2 weeks to help with your daily chores. Wear compression stockings as told by your health care provider. These stockings help to prevent blood clots and reduce swelling in your legs. Keep all follow-up visits. This is important.  Contact a health care provider if: You have any of these signs of infection: Chills or a fever. More redness, swelling, or pain around an incision. Fluid or blood coming from an incision. Warmth coming from an incision. Pus or a bad smell coming from an incision. An incision opens. You feel dizzy or light-headed. You have pain or bleeding when you urinate, or you are unable to urinate. You have  abnormal vaginal discharge. You have pain that does not get better with medicine. Get help right away if: You have a fever and your symptoms suddenly get worse. You have severe abdominal pain. You have chest pain or shortness of breath. You faint. You have pain, swelling, or redness in your leg. You have heavy vaginal bleeding with blood clots, soaking through a sanitary pad in less than 1 hour. These symptoms may represent a serious problem that is an emergency. Do not wait to see if the symptoms will go away. Get medical help right away. Call your local emergency services (911 in the [place] Do not drive yourself to the hospital. Summary After the procedure, it is common to have pain and bruising around your incisions. Do not take baths, swim, or use a hot tub until your health care provider approves. Do not lift anything that is heavier than 10 lb (4.5 kg), or the limit that you are told, for one month after surgery  or until your health care provider says that it is safe. Tell your health care provider if you have any signs or symptoms of infection after the procedure. Get help right away if you have severe abdominal pain, chest pain, shortness of breath, or heavy bleeding from your vagina. This information is not intended to replace advice given to you by your health care provider. Make sure you discuss any questions you have with your healthcare provider. Document Revised: 08/21/2019 Document Reviewed: 08/21/2019 Elsevier Patient Education  [place] Anesthesia, Adult, Care After This sheet gives you information about how to care for yourself after your procedure. Your health care provider may also give you more specific instructions. If you have problems or questions, contact your health careprovider. What can I expect after the procedure? After the procedure, the following side effects are common: Pain or discomfort at the IV site. Nausea. Vomiting. Sore  throat. Trouble concentrating. Feeling cold or chills. Feeling weak or tired. Sleepiness and fatigue. Soreness and body aches. These side effects can affect parts of the body that were not involved in surgery. Follow these instructions at home: For the time period you were told by your health care provider:  Rest. Do not participate in activities where you could fall or become injured. Do not drive or use machinery. Do not drink alcohol. Do not take sleeping pills or medicines that cause drowsiness. Do not make important decisions or sign legal documents. Do not take care of children on your own.  Eating and drinking Follow any instructions from your health care provider about eating or drinking restrictions. When you feel hungry, start by eating small amounts of foods that are soft and easy to digest (bland), such as toast. Gradually return to your regular diet. Drink enough fluid to keep your urine pale yellow. If you vomit, rehydrate by drinking water, juice, or clear broth. General instructions If you have sleep apnea, surgery and certain medicines can increase your risk for breathing problems. Follow instructions from your health care provider about wearing your sleep device: Anytime you are sleeping, including during daytime naps. While taking prescription pain medicines, sleeping medicines, or medicines that make you drowsy. Have a responsible adult stay with you for the time you are told. It is important to have someone help care for you until you are awake and alert. Return to your normal activities as told by your health care provider. Ask your health care provider what activities are safe for you. Take over-the-counter and prescription medicines only as told by your health care provider. If you smoke, do not smoke without supervision. Keep all follow-up visits as told by your health care provider. This is important. Contact a health care provider if: You have nausea or  vomiting that does not get better with medicine. You cannot eat or drink without vomiting. You have pain that does not get better with medicine. You are unable to pass urine. You develop a skin rash. You have a fever. You have redness around your IV site that gets worse. Get help right away if: You have difficulty breathing. You have chest pain. You have blood in your urine or stool, or you vomit blood. Summary After the procedure, it is common to have a sore throat or nausea. It is also common to feel tired. Have a responsible adult stay with you for the time you are told. It is important to have someone help care for you until you are awake and alert. When you  feel hungry, start by eating small amounts of foods that are soft and easy to digest (bland), such as toast. Gradually return to your regular diet. Drink enough fluid to keep your urine pale yellow. Return to your normal activities as told by your health care provider. Ask your health care provider what activities are safe for you. This information is not intended to replace advice given to you by your health care provider. Make sure you discuss any questions you have with your healthcare provider. Document Revised: 09/03/2019 Document Reviewed: 04/02/2019 Elsevier Patient Education  2022 [place]. How to Use Chlorhexidine for Bathing Chlorhexidine gluconate (CHG) is a germ-killing (antiseptic) solution that is used to clean the skin. It can get rid of the bacteria that normally live on the skin and can keep them away for about 24 hours. To clean your skin with CHG, you may be given: A CHG solution to use in the shower or as part of a sponge bath. A prepackaged cloth that contains CHG. Cleaning your skin with CHG may help lower the risk for infection: While you are staying in the intensive care unit of the hospital. If you have a vascular access, such as a central line, to provide short-term or long-term access to your  veins. If you have a catheter to drain urine from your bladder. If you are on a ventilator. A ventilator is a machine that helps you breathe by moving air in and out of your lungs. After surgery. What are the risks? Risks of using CHG include: A skin reaction. Hearing loss, if CHG gets in your ears. Eye injury, if CHG gets in your eyes and is not rinsed out. The CHG product catching fire. Make sure that you avoid smoking and flames after applying CHG to your skin. Do not use CHG: If you have a chlorhexidine allergy or have previously reacted to chlorhexidine. On babies younger than 40 months of age. How to use CHG solution Use CHG only as told by your health care provider, and follow the instructions on the label. Use the full amount of CHG as directed. Usually, this is one bottle. During a shower Follow these steps when using CHG solution during a shower (unless your health care provider gives you different instructions): Start the shower. Use your normal soap and shampoo to wash your face and hair. Turn off the shower or move out of the shower stream. Pour the CHG onto a clean washcloth. Do not use any type of brush or rough-edged sponge. Starting at your neck, lather your body down to your toes. Make sure you follow these instructions: If you will be having surgery, pay special attention to the part of your body where you will be having surgery. Scrub this area for at least 1 minute. Do not use CHG on your head or face. If the solution gets into your ears or eyes, rinse them well with water. Avoid your genital area. Avoid any areas of skin that have broken skin, cuts, or scrapes. Scrub your back and under your arms. Make sure to wash skin folds. Let the lather sit on your skin for 1-2 minutes or as long as told by your health care provider. Thoroughly rinse your entire body in the shower. Make sure that all body creases and crevices are rinsed well. Dry off with a clean towel. Do not  put any substances on your body afterward--such as powder, lotion, or perfume--unless you are told to do so by your health care provider.  Only use lotions that are recommended by the manufacturer. Put on clean clothes or pajamas. If it is the night before your surgery, sleep in clean sheets.  During a sponge bath Follow these steps when using CHG solution during a sponge bath (unless your health care provider gives you different instructions): Use your normal soap and shampoo to wash your face and hair. Pour the CHG onto a clean washcloth. Starting at your neck, lather your body down to your toes. Make sure you follow these instructions: If you will be having surgery, pay special attention to the part of your body where you will be having surgery. Scrub this area for at least 1 minute. Do not use CHG on your head or face. If the solution gets into your ears or eyes, rinse them well with water. Avoid your genital area. Avoid any areas of skin that have broken skin, cuts, or scrapes. Scrub your back and under your arms. Make sure to wash skin folds. Let the lather sit on your skin for 1-2 minutes or as long as told by your health care provider. Using a different clean, wet washcloth, thoroughly rinse your entire body. Make sure that all body creases and crevices are rinsed well. Dry off with a clean towel. Do not put any substances on your body afterward--such as powder, lotion, or perfume--unless you are told to do so by your health care provider. Only use lotions that are recommended by the manufacturer. Put on clean clothes or pajamas. If it is the night before your surgery, sleep in clean sheets. How to use CHG prepackaged cloths Only use CHG cloths as told by your health care provider, and follow the instructions on the label. Use the CHG cloth on clean, dry skin. Do not use the CHG cloth on your head or face unless your health care provider tells you to. When washing with the CHG  cloth: Avoid your genital area. Avoid any areas of skin that have broken skin, cuts, or scrapes. Before surgery Follow these steps when using a CHG cloth to clean before surgery (unless your health care provider gives you different instructions): Using the CHG cloth, vigorously scrub the part of your body where you will be having surgery. Scrub using a back-and-forth motion for 3 minutes. The area on your body should be completely wet with CHG when you are done scrubbing. Do not rinse. Discard the cloth and let the area air-dry. Do not put any substances on the area afterward, such as powder, lotion, or perfume. Put on clean clothes or pajamas. If it is the night before your surgery, sleep in clean sheets.  For general bathing Follow these steps when using CHG cloths for general bathing (unless your health care provider gives you different instructions). Use a separate CHG cloth for each area of your body. Make sure you wash between any folds of skin and between your fingers and toes. Wash your body in the following order, switching to a new cloth after each step: The front of your neck, shoulders, and chest. Both of your arms, under your arms, and your hands. Your stomach and groin area, avoiding the genitals. Your right leg and foot. Your left leg and foot. The back of your neck, your back, and your buttocks. Do not rinse. Discard the cloth and let the area air-dry. Do not put any substances on your body afterward--such as powder, lotion, or perfume--unless you are told to do so by your health care provider. Only use lotions  that are recommended by the manufacturer. Put on clean clothes or pajamas. Contact a health care provider if: Your skin gets irritated after scrubbing. You have questions about using your solution or cloth. Get help right away if: Your eyes become very red or swollen. Your eyes itch badly. Your skin itches badly and is red or swollen. Your hearing changes. You have  trouble seeing. You have swelling or tingling in your mouth or throat. You have trouble breathing. You swallow any chlorhexidine. Summary Chlorhexidine gluconate (CHG) is a germ-killing (antiseptic) solution that is used to clean the skin. Cleaning your skin with CHG may help to lower your risk for infection. You may be given CHG to use for bathing. It may be in a bottle or in a prepackaged cloth to use on your skin. Carefully follow your health care provider's instructions and the instructions on the product label. Do not use CHG if you have a chlorhexidine allergy. Contact your health care provider if your skin gets irritated after scrubbing. This information is not intended to replace advice given to you by your health care provider. Make sure you discuss any questions you have with your healthcare provider. Document Revised: 05/01/2019 Document Reviewed: 06/05/2019 Elsevier Patient Education  [place].

## 2020-08-25 ENCOUNTER — Other Ambulatory Visit: Payer: Self-pay | Admitting: Internal Medicine

## 2020-08-26 ENCOUNTER — Encounter (HOSPITAL_COMMUNITY)
Admission: RE | Admit: 2020-08-26 | Discharge: 2020-08-26 | Disposition: A | Discharge status: Home / Self Care | Payer: 59 | Source: Ambulatory Visit | Attending: Obstetrics & Gynecology | Admitting: Obstetrics & Gynecology

## 2020-08-26 ENCOUNTER — Encounter (HOSPITAL_COMMUNITY): Payer: Self-pay | Admitting: Obstetrics & Gynecology

## 2020-08-26 ENCOUNTER — Other Ambulatory Visit: Payer: Self-pay

## 2020-08-26 DIAGNOSIS — E119 Type 2 diabetes mellitus without complications: Secondary | ICD-10-CM | POA: Diagnosis not present

## 2020-08-26 DIAGNOSIS — Z01818 Encounter for other preprocedural examination: Secondary | ICD-10-CM | POA: Insufficient documentation

## 2020-08-26 LAB — COMPREHENSIVE METABOLIC PANEL
ALT: 21 U/L (ref 0–44)
AST: 20 U/L (ref 15–41)
Albumin: 3.8 g/dL (ref 3.5–5.0)
Alkaline Phosphatase: 68 U/L (ref 38–126)
Anion gap: 5 (ref 5–15)
BUN: 9 mg/dL (ref 6–20)
CO2: 20 mmol/L — ABNORMAL LOW (ref 22–32)
Calcium: 8.3 mg/dL — ABNORMAL LOW (ref 8.9–10.3)
Chloride: 110 mmol/L (ref 98–111)
Creatinine, Ser: 0.8 mg/dL (ref 0.44–1.00)
GFR, Estimated: >60 mL/min (ref >60)
Glucose, Bld: 80 mg/dL (ref 70–99)
Potassium: 3.9 mmol/L (ref 3.5–5.1)
Sodium: 135 mmol/L (ref 135–145)
Total Bilirubin: 0.4 mg/dL (ref 0.3–1.2)
Total Protein: 6.8 g/dL (ref 6.5–8.1)

## 2020-08-26 LAB — CBC
HCT: 39.9 % (ref 36.0–46.0)
Hemoglobin: 12.8 g/dL (ref 12.0–15.0)
MCH: 31.8 pg (ref 26.0–34.0)
MCHC: 32.1 g/dL (ref 30.0–36.0)
MCV: 99 fL (ref 80.0–100.0)
Platelets: 310 10*3/uL (ref 150–400)
RBC: 4.03 MIL/uL (ref 3.87–5.11)
RDW: 14.6 % (ref 11.5–15.5)
WBC: 4.8 10*3/uL (ref 4.0–10.5)
nRBC: 0 % (ref 0.0–0.2)

## 2020-08-26 LAB — TYPE AND SCREEN
ABO/RH(D): O POS
Antibody Screen: NEGATIVE

## 2020-08-26 LAB — HCG, SERUM, QUALITATIVE: Preg, Serum: NEGATIVE

## 2020-08-26 LAB — HEMOGLOBIN A1C
Hgb A1c MFr Bld: 5.2 % (ref 4.8–5.6)
Mean Plasma Glucose: 102.54 mg/dL

## 2020-08-26 NOTE — H&P (Addendum)
[facility] Attending History and Physical  Brooke [name] is a 50 y.o. [id number] presents for scheduled abdominal hysterectomy, bilateral salpingectomy for AUB.  In review, she note continued irregular cycles typically lasting for 5-7 days with moderate to heavy bleeding.  Previously has had bleeding that has been more than 2wks.  Prior ablation in 2016.  Wishes to proceed with a permanent intervention of her irregular bleeding.  Denies any abnormal vaginal discharge, fevers, chills, sweats, dysuria, nausea, vomiting, other GI or GU symptoms or other general symptoms.   Past Medical History: HTN Insulin resistance GERD H/o seizures Depression Vitamin D Def. Obesity Arthritis   Past Surgical History:  Procedure Laterality Date   BUNIONECTOMY Left yrs ago   CERVICAL ABLATION  2017   COLONOSCOPY, ESOPHAGOGASTRODUODENOSCOPY (EGD) AND ESOPHAGEAL DILATION N/A 12/03/2012   [id number] melanosis throughout the entire examined colon/The colon IS redundant/Small internal hemorrhoids/EGD:Esophageal web/Medium sized hiatal hernia/MILD Non-erosive gastritis   ESOPHAGOGASTRODUODENOSCOPY  03/09/09   Dr. Wilford Corner, normal EGD, s/p Bravo capsule placement   ESOPHAGOGASTRODUODENOSCOPY N/A 06/24/2019   rourk: Status post gastric bypass procedure, normal esophagus status post dilation   EYE SURGERY N/A    [name] 10/13/2019   GASTRIC ROUX-EN-Y N/A 07/16/2017   Procedure: LAPAROSCOPIC ROUX-EN-Y GASTRIC BYPASS WITH UPPER ENDOSCOPY AND ERAS PATHWAY;  Surgeon: Johnathan Hausen, MD;  Location: [facility];  Service: General;  Laterality: N/A;   LAPAROSCOPY N/A 07/20/2017   Procedure: LAPAROSCOPY DIAGNOSTIC. REDUCTION OF SMALL BOWEL OBSTRUCTION. REPAIR OF TROCAR HERNIA.;  Surgeon: Alphonsa Overall, MD;  Location: [facility];  Service: General;  Laterality: N/A;   MALONEY DILATION N/A 06/24/2019   Procedure: Keturah Shavers;  Surgeon: Daneil Dolin, MD;  Location: [facility];   Service: Endoscopy;  Laterality: N/A;   TUBAL LIGATION     WISDOM TOOTH EXTRACTION     OB History  Gravida Para Term Preterm AB Living  '3       1 2  '$ SAB IAB Ectopic Multiple Live Births          2    # Outcome Date GA Lbr Len/2nd Weight Sex Delivery Anes PTL Lv  3 Gravida     M Vag-Spont   LIV  2 Gravida     M Vag-Spont   LIV  1 AB              Current Outpatient Medications on File Prior to Encounter  Medication Sig Dispense Refill   acetaminophen (TYLENOL) 500 MG tablet Take 1,000 mg by mouth daily as needed for moderate pain or headache.     calcium carbonate (OSCAL) 1500 (600 Ca) MG TABS tablet Take 1,500 mg by mouth daily.     diclofenac Sodium (VOLTAREN) 1 % GEL Apply 1 g topically 3 (three) times daily as needed for pain.     fluticasone (FLONASE) 50 MCG/ACT nasal spray Place 2 sprays into both nostrils daily. 16 g 1   levETIRAcetam (KEPPRA) 250 MG tablet Take 250 mg by mouth at bedtime.     levocetirizine (XYZAL) 5 MG tablet Take 1 tablet (5 mg total) by mouth every evening. 30 tablet 2   Multiple Vitamins-Minerals (BARIATRIC MULTIVITAMINS/IRON PO) Take 1 tablet by mouth daily.      topiramate (TOPAMAX) 100 MG tablet Take 100 mg by mouth 2 (two) times daily.      venlafaxine XR (EFFEXOR-XR) 75 MG 24 hr capsule 225 mg daily. Take along with 150 mg cap 90 capsule 1   Vitamin D,  Ergocalciferol, (DRISDOL) 1.25 MG (50000 UNIT) CAPS capsule Take 1 capsule (50,000 Units total) by mouth every 7 (seven) days. (Patient taking differently: Take 50,000 Units by mouth every Sunday.) 12 capsule 1   No Known Allergies  Social History:   reports that she quit smoking about 7 years ago. Her smoking use included cigarettes. She has a 6.90 pack-year smoking history. She has never used smokeless tobacco. She reports current alcohol use. She reports that she does not use drugs. Family History  Problem Relation Age of Onset   Diabetes Mother    Hypertension Mother    Drug abuse Mother     Anxiety disorder Mother    Depression Mother    CAD Mother        CABG in 77s   Lung cancer Mother    Hyperlipidemia Mother    Heart disease Mother    Cancer Mother    Obesity Mother    Hypertension Father    Sudden death Father    Diabetes Sister    Hypertension Sister    Hypertension Brother    Drug abuse Brother    CAD Brother        s/p CABG in 14s   Diabetes Paternal Grandmother    Hypertension Brother    Drug abuse Brother    CAD Brother        "HEart artery blockages" in 48s   Hypertension Brother    Drug abuse Brother    Anxiety disorder Maternal Grandmother    Depression Maternal Grandmother    Breast cancer Maternal Grandmother        breast   Asthma Other    Heart disease Other    Colon cancer Neg Hx    Gastric cancer Neg Hx    Esophageal cancer Neg Hx     Review of Systems: Pertinent items noted in HPI and remainder of comprehensive ROS otherwise negative.  PHYSICAL EXAM: Completed in office:  08/16/20 1048  BP: 115/78  Pulse: 80  Weight: 180 lb 3.2 oz (81.7 kg)  Height: '5\' 2"'$  (1.575 m)  Body mass index is 32.96 kg/m.        Physical Examination:              General appearance: alert, well appearing, and in no distress             Psych: mood appropriate, normal affect             Skin: warm & dry              Cardiovascular: normal heart rate noted, RRR             Respiratory: normal respiratory effort, no distress, CTAB             Abdomen: soft, non-tender, prior incisions examined, uterus below umbilicus             Pelvic: deferred             Extremities: no edema, no calf tenderness bilaterally Labs: No results found for this or any previous visit (from the past 336 hour(s)).  Imaging Studies: Last [place]: 04/2019- 8.1cm anteverted uterus with 3.8cm intramural fibroid.  Normal ovaries bilaterally.  Unable to completed SHG  Assessment: Abnormal uterine bleeding- plan for (mini-lap) abdominal hysterectomy and bilateral  salpingectomy  Plan: -NPO -LR @ 125cc/hr -Ancef 2g IV to OR -IV Tylenol given preop -Risk/benefit and alternatives reviewed with patient including risk of bleeding, infection and injury requiring  further surgical intervention.  Also discussed potential for laparotomy pending findings.  Questions and concerns were addressed and she desires to proceed.  Janyth Pupa, DO Attending [facility] for [employer], [place]

## 2020-08-29 ENCOUNTER — Telehealth: Payer: Self-pay | Admitting: Obstetrics & Gynecology

## 2020-08-29 NOTE — Telephone Encounter (Signed)
Patient would like to reschedule her surgery She's scheduled to have it tomorrow with Dr. Nelda Marseille  Please advise

## 2020-08-29 NOTE — Telephone Encounter (Signed)
Spoke with patient, plan to postpone due to family obligations.  We will move this to my next available surgery day, which will be 9/27.  OR aware

## 2020-08-29 NOTE — Telephone Encounter (Signed)
Spoke to patient. States her father in law passed away on 04-11-22 and she is wanting to cancel the surgery.  Talked with Dr Nelda Marseille who stated she would call the patient and discuss options with her.

## 2020-08-29 NOTE — Telephone Encounter (Signed)
She will need to communicate with Dr Nelda Marseille regarding rescheduling her surgery.

## 2020-08-30 ENCOUNTER — Ambulatory Visit: Payer: 59 | Admitting: Family Medicine

## 2020-09-01 ENCOUNTER — Encounter (INDEPENDENT_AMBULATORY_CARE_PROVIDER_SITE_OTHER): Payer: Self-pay | Admitting: Family Medicine

## 2020-09-07 ENCOUNTER — Ambulatory Visit (INDEPENDENT_AMBULATORY_CARE_PROVIDER_SITE_OTHER): Payer: 59 | Admitting: Nurse Practitioner

## 2020-09-07 ENCOUNTER — Encounter: Payer: Self-pay | Admitting: Nurse Practitioner

## 2020-09-07 ENCOUNTER — Other Ambulatory Visit: Payer: Self-pay

## 2020-09-07 ENCOUNTER — Encounter (HOSPITAL_COMMUNITY): Payer: Self-pay

## 2020-09-07 ENCOUNTER — Emergency Department (HOSPITAL_COMMUNITY)
Admission: EM | Admit: 2020-09-07 | Discharge: 2020-09-07 | Disposition: A | Discharge status: Home / Self Care | Payer: 59 | Attending: Emergency Medicine | Admitting: Emergency Medicine

## 2020-09-07 ENCOUNTER — Emergency Department (HOSPITAL_COMMUNITY): Payer: 59

## 2020-09-07 VITALS — BP 139/80 | HR 70 | Ht 62.0 in | Wt 176.0 lb

## 2020-09-07 DIAGNOSIS — R55 Syncope and collapse: Secondary | ICD-10-CM | POA: Insufficient documentation

## 2020-09-07 DIAGNOSIS — M542 Cervicalgia: Secondary | ICD-10-CM | POA: Insufficient documentation

## 2020-09-07 DIAGNOSIS — R519 Headache, unspecified: Secondary | ICD-10-CM | POA: Diagnosis not present

## 2020-09-07 DIAGNOSIS — G4486 Cervicogenic headache: Secondary | ICD-10-CM

## 2020-09-07 DIAGNOSIS — Z87891 Personal history of nicotine dependence: Secondary | ICD-10-CM | POA: Insufficient documentation

## 2020-09-07 DIAGNOSIS — R42 Dizziness and giddiness: Secondary | ICD-10-CM | POA: Insufficient documentation

## 2020-09-07 LAB — URINALYSIS, ROUTINE W REFLEX MICROSCOPIC
Glucose, UA: NEGATIVE mg/dL
Hgb urine dipstick: NEGATIVE
Ketones, ur: 15 mg/dL — AB
Leukocytes,Ua: NEGATIVE
Nitrite: NEGATIVE
Protein, ur: NEGATIVE mg/dL
Specific Gravity, Urine: >1.03 — ABNORMAL HIGH (ref 1.005–1.030)
pH: 5.5 (ref 5.0–8.0)

## 2020-09-07 LAB — BASIC METABOLIC PANEL
Anion gap: 8 (ref 5–15)
BUN: 8 mg/dL (ref 6–20)
CO2: 27 mmol/L (ref 22–32)
Calcium: 8.2 mg/dL — ABNORMAL LOW (ref 8.9–10.3)
Chloride: 104 mmol/L (ref 98–111)
Creatinine, Ser: 0.72 mg/dL (ref 0.44–1.00)
GFR, Estimated: >60 mL/min (ref >60)
Glucose, Bld: 86 mg/dL (ref 70–99)
Potassium: 3.8 mmol/L (ref 3.5–5.1)
Sodium: 139 mmol/L (ref 135–145)

## 2020-09-07 LAB — CBC
HCT: 38.4 % (ref 36.0–46.0)
Hemoglobin: 12.4 g/dL (ref 12.0–15.0)
MCH: 31.9 pg (ref 26.0–34.0)
MCHC: 32.3 g/dL (ref 30.0–36.0)
MCV: 98.7 fL (ref 80.0–100.0)
Platelets: 347 10*3/uL (ref 150–400)
RBC: 3.89 MIL/uL (ref 3.87–5.11)
RDW: 14.1 % (ref 11.5–15.5)
WBC: 4.7 10*3/uL (ref 4.0–10.5)
nRBC: 0 % (ref 0.0–0.2)

## 2020-09-07 LAB — POC URINE PREG, ED: Preg Test, Ur: NEGATIVE

## 2020-09-07 MED ORDER — ACETAMINOPHEN 500 MG PO TABS
1000.0000 mg | ORAL_TABLET | Freq: Once | ORAL | Status: AC
  Administered 2020-09-07: 1000 mg via ORAL
  Filled 2020-09-07: qty 2

## 2020-09-07 MED ORDER — SODIUM CHLORIDE 0.9 % IV BOLUS
500.0000 mL | Freq: Once | INTRAVENOUS | Status: AC
Start: 2020-09-07 — End: 2020-09-07
  Administered 2020-09-07: 500 mL via INTRAVENOUS

## 2020-09-07 MED ORDER — DIPHENHYDRAMINE HCL 50 MG/ML IJ SOLN
12.5000 mg | Freq: Once | INTRAMUSCULAR | Status: AC
  Administered 2020-09-07: 12.5 mg via INTRAVENOUS
  Filled 2020-09-07: qty 1

## 2020-09-07 MED ORDER — METOCLOPRAMIDE HCL 5 MG/ML IJ SOLN
10.0000 mg | Freq: Once | INTRAMUSCULAR | Status: AC
  Administered 2020-09-07: 10 mg via INTRAVENOUS
  Filled 2020-09-07: qty 2

## 2020-09-07 NOTE — ED Triage Notes (Signed)
Pt to er, pt states that she was in the grocery store today and got dizzy, hot and nauseated, states that she felt like she was going to pass out.  Pt states that she thought that it might be because she didn't eat breakfast, so she went home and ate, but still feels dizzy.  States that the dizziness is worse when she moves her head.

## 2020-09-07 NOTE — ED Provider Notes (Signed)
[facility] EMERGENCY DEPARTMENT Provider Note   CSN: [id number] Arrival date & time: 09/07/20  1208     History Chief Complaint  Patient presents with   Dizziness    Brooke [name] is a 50 y.o. female.  HPI  Patient with significant medical history of DDD of the lumbar spine, GERD, anxiety, migraines, vertigo presents to the emergency department with chief complaint of dizziness.  Patient states she woke up this morning had a slight headache, states on the right side of her head and neck, she then developed dizziness while she was at the grocery store, describes the dizziness as a feeling off balance, not as if the room was spinning, it is worsened with head movements, slightly improves while she is not moving, she has occasional paresthesias in her left arm, she denies change in vision, paresthesias, and or weakness in the upper and/or lower extremities at this time, she denies recent head trauma, is not on anticoagulant, has no history of IV drug use, denies associated fevers or chills.  She also notes that she had a similar instance on Saturday, states this happened while she was putting on her close, this lasted only for about 30 minutes then resolve on its own.  Patient states she has had a migraine and vertigo in the past but this feels different as the dizziness does not feel as if the room is spinning and the headache is on the right side of her head where is her normal headaches are all over.  Patient states she took some meclizine but continues to have the dizziness, she thinks it is becoming from her neck as she has chronic neck pain.  She has no other complaints this time.  Does not endorse chest pain, shortness of breath, abdominal pain, worsening pedal edema.  Past Medical History:  Diagnosis Date   Acid reflux    Amenorrhea 02/06/2012   Anxiety    Arthritis    [name] 10/13/2019   B12 deficiency    Breast lump 08/06/2019   Cervical radiculitis    Chest pain    Chronic  constipation    DDD (degenerative disc disease), lumbar    Depression    Depression    [name] 10/13/2019   Dizzy spells    Elevated vitamin B12 level 05/04/2019   Esophageal dysphagia 11/19/2012   Facial numbness    Family history of systemic lupus erythematosus 10/22/2019   Fatty liver    GERD (gastroesophageal reflux disease)    [name] 10/13/2019   Headache(784.0) 04/01/2012   Heart palpitations 01/2017   High cholesterol    History of anemia    History of hiatal hernia    Hypersomnia due to another medical condition 06/12/2017   Hypertension    Insomnia    Intractable migraine with visual aura and without status migrainosus 01/23/2017   Iron deficiency anemia    Irregular periods 08/06/2019   Joint pain    Lactose intolerance    LUQ pain 11/19/2012   Menopausal symptom 08/06/2019   Migraines    occ   Near syncope 01/2017   Numbness and tingling 10/08/2016   Formatting of this note might be different from the original. ---Oct 2018-TEE----Normal left ventricular size and systolic function with no appreciable segmental abnormality. EF 60% There was no evidence of spontaneous echo contrast or thrombus in the left atrium or left atrial appendage. No significant valvular abnormalites noted Bubble study performed, this is negative.   Numbness and tingling in left arm  Obesity    Other malaise and fatigue 05/19/2012   Panic attacks    Raynaud's phenomenon without gangrene 10/22/2019   Sciatica    Seizures ([place])    Slurred speech 11/07/2016   Formatting of this note might be different from the original. ---Oct 2018-TEE----Normal left ventricular size and systolic function with no appreciable segmental abnormality. EF 60% There was no evidence of spontaneous echo contrast or thrombus in the left atrium or left atrial appendage. No significant valvular abnormalites noted Bubble study performed, this is negative.   Small bowel obstruction ([place]) 07/20/2017   Spells of speech arrest 01/23/2017    Transient cerebral ischemia 10/08/2016   Formatting of this note might be different from the original. ---Oct 2018-TEE----Normal left ventricular size and systolic function with no appreciable segmental abnormality. EF 60% There was no evidence of spontaneous echo contrast or thrombus in the left atrium or left atrial appendage. No significant valvular abnormalites noted Bubble study performed, this is negative.   Vitamin D deficiency    Word finding difficulty 01/23/2017    Patient Active Problem List   Diagnosis Date Noted   Near syncope 09/07/2020   Cervicogenic headache 07/07/2020   Insulin resistance 03/08/2020   Anxiety 03/08/2020   Left shoulder pain 02/22/2020   Immunization due 02/22/2020   Environmental and seasonal allergies 10/14/2019   Generalized anxiety disorder with panic attacks 08/20/2019   Arthritis 08/06/2019   Elevated liver enzymes 08/06/2019   Vitamin D deficiency 05/04/2019   Mixed hyperglyceridemia 01/23/2019   Mixed obsessional thoughts and acts 12/12/2018   Seizure disorder ([place]) 10/13/2018   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 07/16/2017   Class 2 severe obesity with serious comorbidity and body mass index (BMI) of 39.0 to 39.9 in adult ([place]) 01/23/2017   Seizures ([place]) 09/25/2016   Morbid obesity ([place]) 12/03/2012   Depression, major, single episode, moderate ([place]) 02/06/2012   Panic attacks 02/06/2012    Past Surgical History:  Procedure Laterality Date   BUNIONECTOMY Left yrs ago   CERVICAL ABLATION  2017   COLONOSCOPY, ESOPHAGOGASTRODUODENOSCOPY (EGD) AND ESOPHAGEAL DILATION N/A 12/03/2012   [id number] melanosis throughout the entire examined colon/The colon IS redundant/Small internal hemorrhoids/EGD:Esophageal web/Medium sized hiatal hernia/MILD Non-erosive gastritis   ESOPHAGOGASTRODUODENOSCOPY  03/09/09   Dr. Wilford Corner, normal EGD, s/p Bravo capsule placement   ESOPHAGOGASTRODUODENOSCOPY N/A 06/24/2019   rourk: Status post gastric  bypass procedure, normal esophagus status post dilation   EYE SURGERY N/A    [name] 10/13/2019   GASTRIC ROUX-EN-Y N/A 07/16/2017   Procedure: LAPAROSCOPIC ROUX-EN-Y GASTRIC BYPASS WITH UPPER ENDOSCOPY AND ERAS PATHWAY;  Surgeon: Johnathan Hausen, MD;  Location: [facility] ORS;  Service: General;  Laterality: N/A;   LAPAROSCOPY N/A 07/20/2017   Procedure: LAPAROSCOPY DIAGNOSTIC. REDUCTION OF SMALL BOWEL OBSTRUCTION. REPAIR OF TROCAR HERNIA.;  Surgeon: Alphonsa Overall, MD;  Location: [facility] ORS;  Service: General;  Laterality: N/A;   MALONEY DILATION N/A 06/24/2019   Procedure: Keturah Shavers;  Surgeon: Daneil Dolin, MD;  Location: [facility];  Service: Endoscopy;  Laterality: N/A;   TUBAL LIGATION     WISDOM TOOTH EXTRACTION       OB History     Gravida  3   Para      Term      Preterm      AB  1   Living  2      SAB      IAB      Ectopic  Multiple      Live Births  2           Family History  Problem Relation Age of Onset   Diabetes Mother    Hypertension Mother    Drug abuse Mother    Anxiety disorder Mother    Depression Mother    CAD Mother        CABG in 30s   Lung cancer Mother    Hyperlipidemia Mother    Heart disease Mother    Cancer Mother    Obesity Mother    Hypertension Father    Sudden death Father    Diabetes Sister    Hypertension Sister    Hypertension Brother    Drug abuse Brother    CAD Brother        s/p CABG in 18s   Diabetes Paternal Grandmother    Hypertension Brother    Drug abuse Brother    CAD Brother        "HEart artery blockages" in 49s   Hypertension Brother    Drug abuse Brother    Anxiety disorder Maternal Grandmother    Depression Maternal Grandmother    Breast cancer Maternal Grandmother        breast   Asthma Other    Heart disease Other    Colon cancer Neg Hx    Gastric cancer Neg Hx    Esophageal cancer Neg Hx     Social History   Tobacco Use   Smoking status: Former    Packs/day: 0.30    Years:  23.00    Pack years: 6.90    Types: Cigarettes    Quit date: 10/04/2012    Years since quitting: 7.9   Smokeless tobacco: Never   Tobacco comments:    less than 1/2 pack cigarettes daily  Vaping Use   Vaping Use: Never used  Substance Use Topics   Alcohol use: Yes    Comment: weekends; hardly/social   Drug use: No    Home Medications Prior to Admission medications   Medication Sig Start Date End Date Taking? Authorizing Provider  acetaminophen (TYLENOL) 500 MG tablet Take 1,000 mg by mouth daily as needed for moderate pain or headache.   Yes [provider]  Brexpiprazole (REXULTI) 0.5 MG TABS Take 1 tablet (0.5 mg total) by mouth daily. 08/22/20 10/21/20 Yes Hisada, Elie Goody, MD  calcium carbonate (OSCAL) 1500 (600 Ca) MG TABS tablet Take 1,500 mg by mouth daily.   Yes [provider]  diclofenac Sodium (VOLTAREN) 1 % GEL Apply 1 g topically 3 (three) times daily as needed for pain. 06/06/19  Yes [provider]  fluticasone (FLONASE) 50 MCG/ACT nasal spray Place 2 sprays into both nostrils daily. 10/14/19  Yes Perlie Mayo, NP  levETIRAcetam (KEPPRA) 250 MG tablet Take 250 mg by mouth at bedtime.   Yes [provider]  levocetirizine (XYZAL) 5 MG tablet Take 1 tablet (5 mg total) by mouth every evening. 10/14/19  Yes Perlie Mayo, NP  Multiple Vitamins-Minerals (BARIATRIC MULTIVITAMINS/IRON PO) Take 1 tablet by mouth daily.    Yes [provider]  pantoprazole (PROTONIX) 40 MG tablet TAKE 1 TABLET BY MOUTH TWICE DAILY Patient taking differently: Take 40 mg by mouth 2 (two) times daily. 08/25/20  Yes [name] Menghini, PA-C  topiramate (TOPAMAX) 100 MG tablet Take 100 mg by mouth 2 (two) times daily.    Yes [provider]  TRULICITY 3 0000000 SOPN INJECT 3 MG INTO  THE SKIN ONCE A WEEK Patient taking differently: Inject 3 mg into the skin once a week. Sundays 08/24/20  Yes Dennard Nip D, MD  venlafaxine XR (EFFEXOR-XR) 150 MG 24 hr  capsule Total of 225 mg along with 75 mg tab 09/21/20  Yes Hisada, Elie Goody, MD  venlafaxine XR (EFFEXOR-XR) 75 MG 24 hr capsule 225 mg daily. Take along with 150 mg cap 05/09/20  Yes Hisada, Reina, MD  Vitamin D, Ergocalciferol, (DRISDOL) 1.25 MG (50000 UNIT) CAPS capsule Take 1 capsule (50,000 Units total) by mouth every 7 (seven) days. Patient taking differently: Take 50,000 Units by mouth every Sunday. 06/09/20  Yes Laqueta Linden, MD    Allergies    Patient has no known allergies.  Review of Systems   Review of Systems  Constitutional:  Negative for chills and fever.  HENT:  Negative for congestion.   Respiratory:  Negative for shortness of breath.   Cardiovascular:  Negative for chest pain.  Gastrointestinal:  Negative for abdominal pain, diarrhea, nausea and vomiting.  Genitourinary:  Negative for enuresis.  Musculoskeletal:  Negative for back pain.  Skin:  Negative for rash.  Neurological:  Positive for dizziness and headaches. Negative for weakness and numbness.  Hematological:  Does not bruise/bleed easily.   Physical Exam Updated Vital Signs BP (!) 153/97   Pulse 71   Temp 98.5 F (36.9 C)   Resp 18   Ht '5\' 2"'$  (1.575 m)   Wt 79.8 kg   LMP 09/01/2020 (Approximate)   SpO2 100%   BMI 32.19 kg/m   Physical Exam Vitals and nursing note reviewed.  Constitutional:      General: She is not in acute distress.    Appearance: She is not ill-appearing.  HENT:     Head: Normocephalic and atraumatic.     Nose: No congestion.  Eyes:     Extraocular Movements: Extraocular movements intact.     Conjunctiva/sclera: Conjunctivae normal.     Pupils: Pupils are equal, round, and reactive to light.     Comments: Patient's left eye is nonreactive to light, this is normal for her as she has had surgery in that eye.  Right eye was PERRLA.  Cardiovascular:     Rate and Rhythm: Normal rate and regular rhythm.     Pulses: Normal pulses.     Heart sounds: No murmur heard.   No  friction rub. No gallop.  Pulmonary:     Effort: No respiratory distress.     Breath sounds: No wheezing, rhonchi or rales.  Musculoskeletal:     Comments: Spine was palpated was nontender to palpation.  No step-off or deformities present.  She has 5 of 5 strength, neurovascular tact in upper and lower extremities.  Skin:    General: Skin is warm and dry.  Neurological:     Mental Status: She is alert.     GCS: GCS eye subscore is 4. GCS verbal subscore is 5. GCS motor subscore is 6.     Cranial Nerves: No cranial nerve deficit or facial asymmetry.     Sensory: Sensation is intact.     Motor: No weakness.     Coordination: Romberg sign negative. Finger-Nose-Finger Test normal.     Gait: Gait is intact.     Comments: Cranial nerves II through XII are grossly intact no with difficulty word finding, no slurring of his word, able to follow two-step commands, no difficult ambulation  Psychiatric:        Mood and  Affect: Mood normal.    ED Results / Procedures / Treatments   Labs (all labs ordered are listed, but only abnormal results are displayed) Labs Reviewed  BASIC METABOLIC PANEL - Abnormal; Notable for the following components:      Result Value   Calcium 8.2 (*)    All other components within normal limits  URINALYSIS, ROUTINE W REFLEX MICROSCOPIC - Abnormal; Notable for the following components:   Specific Gravity, Urine >1.030 (*)    Bilirubin Urine SMALL (*)    Ketones, ur 15 (*)    All other components within normal limits  CBC  POC URINE PREG, ED    EKG EKG Interpretation  Date/Time:  Wednesday September 07 2020 12:34:39 EDT Ventricular Rate:  67 PR Interval:  136 QRS Duration: 80 QT Interval:  414 QTC Calculation: 437 R Axis:   54 Text Interpretation: Normal sinus rhythm Nonspecific T wave abnormality Abnormal ECG No significant change since prior 8/22 Confirmed by Aletta Edouard [phone]) on 09/07/2020 3:07:58 PM  Radiology CT Head Wo Contrast  Result Date:  09/07/2020 CLINICAL DATA:  Headache, dizziness EXAM: CT HEAD WITHOUT CONTRAST TECHNIQUE: Contiguous axial images were obtained from the base of the skull through the vertex without intravenous contrast. COMPARISON:  CT head 07/05/2018 FINDINGS: Brain: There is no acute intracranial hemorrhage, extra-axial fluid collection, or infarct. The ventricles are stable in size. There is no mass lesion. There is no midline shift. Vascular: No hyperdense vessel or unexpected calcification. Skull: Normal. Negative for fracture or focal lesion. Sinuses/Orbits: The paranasal sinuses are clear. Curvilinear calcification along the posterolateral aspect of the left globe and nonvisualization of the left lens are8 unchanged since 2018. Other: None. IMPRESSION: No acute intracranial pathology. Electronically Signed   By: Valetta Mole M.D.   On: 09/07/2020 16:59    Procedures Procedures   Medications Ordered in ED Medications  metoCLOPramide (REGLAN) injection 10 mg (10 mg Intravenous Given 09/07/20 1725)  diphenhydrAMINE (BENADRYL) injection 12.5 mg (12.5 mg Intravenous Given 09/07/20 1724)  acetaminophen (TYLENOL) tablet 1,000 mg (1,000 mg Oral Given 09/07/20 1722)  sodium chloride 0.9 % bolus 500 mL (500 mLs Intravenous Bolus 09/07/20 1723)    ED Course  I have reviewed the triage vital signs and the nursing notes.  Pertinent labs & imaging results that were available during my care of the patient were reviewed by me and considered in my medical decision making (see chart for details).    MDM Rules/Calculators/A&P                          Initial impression-patient presents with dizziness and a headache.  She is alert, does not appear to be  in acute stress, vital signs reassuring.  Suspect complex migraine, basic lab work was obtained, will add on CT head, provide migraine cocktail and reassess.  Work-up-CBC is unremarkable, BMP unremarkable, EKG sinus without signs of ischemia.  Orthostatic negative, UA shows  specific gravity of greater than 1.03, ketones present,  CT head negative for acute findings.  Reassessment-patient is reassessed, states she is feeling much better, has no other complaints at this time.  Patient agreeable for discharge.  Rule out-I have low suspicion for intracranial head bleed or mass as CT head negative for acute findings.  I have low suspicion for CVA, dissection/aneurysm of carotid and/or vertebral arteries as patient has no neurodeficits present my exam, presentation is atypical of etiology, dizziness is worsening with head movements, patient has low  risk factors.  Low suspicion for meningitis or systemic infection as patient is nontoxic-appearing, vital signs reassuring, she denies history of IV drug use, no meningeal sign present. Low suspicion for ACS as patient has chest pain, shortness of breath, no signs of hypoperfusion fluid overload on my noted my exam, she has low cardiac risk factors, EKG sinus without signs of ischemia.  Plan-.  Headache and dizziness since resolved-suspect this was secondary due to a migraine versus possible dehydration as she has a significant high specific gravity.  continue with over-the-counter pain medications, hydrate, follow-up with PCP and neurology for further evaluation.  Vital signs have remained stable, no indication for hospital admission. Patient given at home care as well strict return precautions.  Patient verbalized that they understood agreed to said plan.  Final Clinical Impression(s) / ED Diagnoses Final diagnoses:  Dizziness  Acute nonintractable headache, unspecified headache type    Rx / DC Orders ED Discharge Orders     None        Marcello Fennel, PA-C 09/07/20 Edd Fabian, MD 09/08/20 1048

## 2020-09-07 NOTE — Progress Notes (Signed)
Acute Office Visit  Subjective:    Patient ID: Brooke [name], female    DOB: 05-Jan-1970, 50 y.o.   MRN: [id number]  Chief Complaint  Patient presents with   Dizziness    Started this morning while in the grocery store, is nauseous also. Did have 1 episode of vomiting on Saturday.     Dizziness  Patient is in today for dizziness that started on Saturday. She had 2 episodes on Saturday and one episode this AM.  She states that she had some sweating.  Past Medical History:  Diagnosis Date   Acid reflux    Amenorrhea 02/06/2012   Anxiety    Arthritis    [name] 10/13/2019   B12 deficiency    Breast lump 08/06/2019   Cervical radiculitis    Chest pain    Chronic constipation    DDD (degenerative disc disease), lumbar    Depression    Depression    [name] 10/13/2019   Dizzy spells    Elevated vitamin B12 level 05/04/2019   Esophageal dysphagia 11/19/2012   Facial numbness    Family history of systemic lupus erythematosus 10/22/2019   Fatty liver    GERD (gastroesophageal reflux disease)    [name] 10/13/2019   Headache(784.0) 04/01/2012   Heart palpitations 01/2017   High cholesterol    History of anemia    History of hiatal hernia    Hypersomnia due to another medical condition 06/12/2017   Hypertension    Insomnia    Intractable migraine with visual aura and without status migrainosus 01/23/2017   Iron deficiency anemia    Irregular periods 08/06/2019   Joint pain    Lactose intolerance    LUQ pain 11/19/2012   Menopausal symptom 08/06/2019   Migraines    occ   Near syncope 01/2017   Numbness and tingling 10/08/2016   Formatting of this note might be different from the original. ---Oct 2018-TEE----Normal left ventricular size and systolic function with no appreciable segmental abnormality. EF 60% There was no evidence of spontaneous echo contrast or thrombus in the left atrium or left atrial appendage. No significant valvular abnormalites noted Bubble study performed,  this is negative.   Numbness and tingling in left arm    Obesity    Other malaise and fatigue 05/19/2012   Panic attacks    Raynaud's phenomenon without gangrene 10/22/2019   Sciatica    Seizures (HCC)    Slurred speech 11/07/2016   Formatting of this note might be different from the original. ---Oct 2018-TEE----Normal left ventricular size and systolic function with no appreciable segmental abnormality. EF 60% There was no evidence of spontaneous echo contrast or thrombus in the left atrium or left atrial appendage. No significant valvular abnormalites noted Bubble study performed, this is negative.   Small bowel obstruction (HCC) 07/20/2017   Spells of speech arrest 01/23/2017   Transient cerebral ischemia 10/08/2016   Formatting of this note might be different from the original. ---Oct 2018-TEE----Normal left ventricular size and systolic function with no appreciable segmental abnormality. EF 60% There was no evidence of spontaneous echo contrast or thrombus in the left atrium or left atrial appendage. No significant valvular abnormalites noted Bubble study performed, this is negative.   Vitamin D deficiency    Word finding difficulty 01/23/2017    Past Surgical History:  Procedure Laterality Date   BUNIONECTOMY Left yrs ago   CERVICAL ABLATION  2017   COLONOSCOPY, ESOPHAGOGASTRODUODENOSCOPY (EGD) AND ESOPHAGEAL DILATION N/A 12/03/2012   GNP:[id number]  melanosis throughout the entire examined colon/The colon IS redundant/Small internal hemorrhoids/EGD:Esophageal web/Medium sized hiatal hernia/MILD Non-erosive gastritis   ESOPHAGOGASTRODUODENOSCOPY  03/09/09   Dr. Wilford Corner, normal EGD, s/p Bravo capsule placement   ESOPHAGOGASTRODUODENOSCOPY N/A 06/24/2019   rourk: Status post gastric bypass procedure, normal esophagus status post dilation   EYE SURGERY N/A    [name] 10/13/2019   GASTRIC ROUX-EN-Y N/A 07/16/2017   Procedure: LAPAROSCOPIC ROUX-EN-Y GASTRIC BYPASS WITH UPPER ENDOSCOPY AND  ERAS PATHWAY;  Surgeon: Johnathan Hausen, MD;  Location: [facility];  Service: General;  Laterality: N/A;   LAPAROSCOPY N/A 07/20/2017   Procedure: LAPAROSCOPY DIAGNOSTIC. REDUCTION OF SMALL BOWEL OBSTRUCTION. REPAIR OF TROCAR HERNIA.;  Surgeon: Alphonsa Overall, MD;  Location: [facility];  Service: General;  Laterality: N/A;   MALONEY DILATION N/A 06/24/2019   Procedure: Keturah Shavers;  Surgeon: Daneil Dolin, MD;  Location: [facility];  Service: Endoscopy;  Laterality: N/A;   TUBAL LIGATION     WISDOM TOOTH EXTRACTION      Family History  Problem Relation Age of Onset   Diabetes Mother    Hypertension Mother    Drug abuse Mother    Anxiety disorder Mother    Depression Mother    CAD Mother        CABG in 63s   Lung cancer Mother    Hyperlipidemia Mother    Heart disease Mother    Cancer Mother    Obesity Mother    Hypertension Father    Sudden death Father    Diabetes Sister    Hypertension Sister    Hypertension Brother    Drug abuse Brother    CAD Brother        s/p CABG in 84s   Diabetes Paternal Grandmother    Hypertension Brother    Drug abuse Brother    CAD Brother        "HEart artery blockages" in 14s   Hypertension Brother    Drug abuse Brother    Anxiety disorder Maternal Grandmother    Depression Maternal Grandmother    Breast cancer Maternal Grandmother        breast   Asthma Other    Heart disease Other    Colon cancer Neg Hx    Gastric cancer Neg Hx    Esophageal cancer Neg Hx     Social History   Socioeconomic History   Marital status: Married    Spouse name: Randall Hiss    Number of children: 2   Years of education: Not on file   Highest education level: Some college, no degree  Occupational History   Occupation: stay at home  Tobacco Use   Smoking status: Former    Packs/day: 0.30    Years: 23.00    Pack years: 6.90    Types: Cigarettes    Quit date: 10/04/2012    Years since quitting: 7.9   Smokeless tobacco: Never   Tobacco comments:    less  than 1/2 pack cigarettes daily  Vaping Use   Vaping Use: Never used  Substance and Sexual Activity   Alcohol use: Yes    Comment: weekends; hardly/social   Drug use: No   Sexual activity: Yes    Partners: Male    Birth control/protection: Surgical  Other Topics Concern   Not on file  Social History Narrative   Lives with husband, married 71 years    41 son Teron    42 son Jiles Harold -two grandchildren    Live  close by    Rising cousin-custody of her daughter 9 [address]idy       Right handed   Pets: none      Enjoys: [employer], shopping, likes being outside       Diet: eggs, oatmeal, salad, all food groups no lot of proteins, good on veggies.    Caffeine: sweet tea-2 cups  Coffee-1 cup daily    Water: 2-3 16 oz bottles daily       Wears seat belt    Smoke and carbon monoxide detectors   Does use phone while driving but hands free   Social Determinants of Health   Financial Resource Strain: Not on file  Food Insecurity: Not on file  Transportation Needs: Not on file  Physical Activity: Not on file  Stress: Not on file  Social Connections: Not on file  Intimate Partner Violence: Not on file    Outpatient Medications Prior to Visit  Medication Sig Dispense Refill   acetaminophen (TYLENOL) 500 MG tablet Take 1,000 mg by mouth daily as needed for moderate pain or headache.     Brexpiprazole (REXULTI) 0.5 MG TABS Take 1 tablet (0.5 mg total) by mouth daily. 30 tablet 1   calcium carbonate (OSCAL) 1500 (600 Ca) MG TABS tablet Take 1,500 mg by mouth daily.     diclofenac Sodium (VOLTAREN) 1 % GEL Apply 1 g topically 3 (three) times daily as needed for pain.     fluticasone (FLONASE) 50 MCG/ACT nasal spray Place 2 sprays into both nostrils daily. 16 g 1   levETIRAcetam (KEPPRA) 250 MG tablet Take 250 mg by mouth at bedtime.     levocetirizine (XYZAL) 5 MG tablet Take 1 tablet (5 mg total) by mouth every evening. 30 tablet 2   Multiple Vitamins-Minerals (BARIATRIC MULTIVITAMINS/IRON PO)  Take 1 tablet by mouth daily.      pantoprazole (PROTONIX) 40 MG tablet TAKE 1 TABLET BY MOUTH TWICE DAILY 60 tablet 5   topiramate (TOPAMAX) 100 MG tablet Take 100 mg by mouth 2 (two) times daily.      TRULICITY 3 UV/2.5DG SOPN INJECT 3 MG INTO THE SKIN ONCE A WEEK 2 mL 0   [START ON 09/21/2020] venlafaxine XR (EFFEXOR-XR) 150 MG 24 hr capsule Total of 225 mg along with 75 mg tab 90 capsule 1   venlafaxine XR (EFFEXOR-XR) 75 MG 24 hr capsule 225 mg daily. Take along with 150 mg cap 90 capsule 1   Vitamin D, Ergocalciferol, (DRISDOL) 1.25 MG (50000 UNIT) CAPS capsule Take 1 capsule (50,000 Units total) by mouth every 7 (seven) days. (Patient taking differently: Take 50,000 Units by mouth every Sunday.) 12 capsule 1   No facility-administered medications prior to visit.    No Known Allergies  Review of Systems  Neurological:  Positive for dizziness.      Objective:    Physical Exam  BP 139/80   Pulse 70   Ht $R'5\' 2"'Uc$  (1.575 m)   Wt 176 lb (79.8 kg)   LMP 09/01/2020 (Approximate)   BMI 32.19 kg/m  Wt Readings from Last 3 Encounters:  09/07/20 176 lb (79.8 kg)  08/26/20 180 lb (81.6 kg)  08/16/20 180 lb 3.2 oz (81.7 kg)    Health Maintenance Due  Topic Date Due   COVID-19 Vaccine (3 - Pfizer risk series) 05/01/2019   INFLUENZA VACCINE  08/01/2020    There are no preventive care reminders to display for this patient.   Lab Results  Component Value Date   TSH  1.390 09/14/2019   Lab Results  Component Value Date   WBC 4.8 08/26/2020   HGB 12.8 08/26/2020   HCT 39.9 08/26/2020   MCV 99.0 08/26/2020   PLT 310 08/26/2020   Lab Results  Component Value Date   NA 135 08/26/2020   K 3.9 08/26/2020   CO2 20 (L) 08/26/2020   GLUCOSE 80 08/26/2020   BUN 9 08/26/2020   CREATININE 0.80 08/26/2020   BILITOT 0.4 08/26/2020   ALKPHOS 68 08/26/2020   AST 20 08/26/2020   ALT 21 08/26/2020   PROT 6.8 08/26/2020   ALBUMIN 3.8 08/26/2020   CALCIUM 8.3 (L) 08/26/2020    ANIONGAP 5 08/26/2020   EGFR 90 03/24/2020   Lab Results  Component Value Date   CHOL 193 03/24/2020   Lab Results  Component Value Date   HDL 64 03/24/2020   Lab Results  Component Value Date   LDLCALC 117 (H) 03/24/2020   Lab Results  Component Value Date   TRIG 66 03/24/2020   Lab Results  Component Value Date   CHOLHDL 3.0 03/24/2020   Lab Results  Component Value Date   HGBA1C 5.2 08/26/2020       Assessment & Plan:   Problem List Items Addressed This Visit       Cardiovascular and Mediastinum   Near syncope    -pt states that she felt like she was going to pass out and was sweating on Saturday x2 episodes -felt like she was going to pass out in the grocery store this AM -referral to cardiology; urgent -she was instructed to go to ED for emergent cardiac workup to r/o MI or ACS; she is in agreeance -other DDx. Include anxiety/panic disorder, vertigo, and complicated migraine -she has hx of vertigo and states this feels different- no room spinning sensation, just nausea, diaphoresis and near syncope      Relevant Orders   Ambulatory referral to Cardiology     Other   Cervicogenic headache - Primary   Relevant Orders   Ambulatory referral to Neurology     No orders of the defined types were placed in this encounter.  Date:  09/07/2020   Location of Patient: Home Location of Provider: Office Consent was obtain for visit to be over via telehealth. I verified that I am speaking with the correct person using two identifiers.  I connected with  [name] on 09/07/20 via telephone and verified that I am speaking with the correct person using two identifiers.   I discussed the limitations of evaluation and management by telemedicine. The patient expressed understanding and agreed to proceed.  Time spent: 9 min   Noreene Larsson, NP

## 2020-09-07 NOTE — Discharge Instructions (Addendum)
Lab work and imaging were reassuring, suspect you had a migraine, please continue with home medication as prescribed.  Also recommend decreasing your stress, increasing exercising, staying hydrated, eating healthy as this can help prevent future migraines.  Of note you will appear to be slight dehydrated on exam please continue to hydrate.  Please follow-up your PCP and/or neurology for further evaluation.  Come back to the emergency department if you develop chest pain, shortness of breath, severe abdominal pain, uncontrolled nausea, vomiting, diarrhea.

## 2020-09-07 NOTE — Assessment & Plan Note (Addendum)
-  pt states that she felt like she was going to pass out and was sweating on Saturday x2 episodes -felt like she was going to pass out in the grocery store this AM -referral to cardiology; urgent -she was instructed to go to ED for emergent cardiac workup to r/o MI or ACS; she is in agreeance -other DDx. Include anxiety/panic disorder, vertigo, and complicated migraine -she has hx of vertigo and states this feels different- no room spinning sensation, just nausea, diaphoresis and near syncope

## 2020-09-08 ENCOUNTER — Telehealth: Payer: Self-pay | Admitting: Neurology

## 2020-09-08 ENCOUNTER — Telehealth: Payer: Self-pay

## 2020-09-08 ENCOUNTER — Other Ambulatory Visit: Payer: Self-pay | Admitting: *Deleted

## 2020-09-08 MED ORDER — TOPIRAMATE 100 MG PO TABS: 100.0000 mg | ORAL_TABLET | Freq: Two times a day (BID) | ORAL | 0 refills | Status: DC

## 2020-09-08 MED ORDER — LEVETIRACETAM 250 MG PO TABS: 250.0000 mg | ORAL_TABLET | Freq: Every day | ORAL | 0 refills | Status: DC

## 2020-09-08 NOTE — Telephone Encounter (Signed)
Called patient to set up appointment from referral we received for her. Patient asked if we could send in a refill for Topamax and Keppra. Let her know I would send a message to our clinical staff regarding this

## 2020-09-08 NOTE — Telephone Encounter (Signed)
Medication sent to pt pharmacy 

## 2020-09-08 NOTE — Telephone Encounter (Signed)
Patient called need med refill no longer gets from other provider.    topiramate (TOPAMAX) 100 MG tablet  levETIRAcetam (KEPPRA) 250 MG tablet   Pharmacy: [facility]

## 2020-09-08 NOTE — Telephone Encounter (Signed)
Called patient and let her know unfortunately we are not able to refill any medications as it has been over a year since we have seen her and that she can speak with her PCP regarding refills in the time between seeing [place]. I have also added her to cancellation list. Patient aware to call back if she has any concerns.

## 2020-09-08 NOTE — Telephone Encounter (Signed)
It has been over 1 year since she has been seen. We cannot refill meds until she is seen. She can request PCP to refill until she comes in to be seen here

## 2020-09-12 ENCOUNTER — Ambulatory Visit (INDEPENDENT_AMBULATORY_CARE_PROVIDER_SITE_OTHER): Payer: 59 | Admitting: Internal Medicine

## 2020-09-12 ENCOUNTER — Encounter: Payer: Self-pay | Admitting: Internal Medicine

## 2020-09-12 ENCOUNTER — Other Ambulatory Visit: Payer: Self-pay

## 2020-09-12 ENCOUNTER — Encounter: Payer: 59 | Admitting: Obstetrics & Gynecology

## 2020-09-12 VITALS — BP 118/81 | HR 82 | Temp 98.0°F | Resp 18 | Ht 62.0 in | Wt 174.0 lb

## 2020-09-12 DIAGNOSIS — Z09 Encounter for follow-up examination after completed treatment for conditions other than malignant neoplasm: Secondary | ICD-10-CM

## 2020-09-12 DIAGNOSIS — M542 Cervicalgia: Secondary | ICD-10-CM | POA: Diagnosis not present

## 2020-09-12 DIAGNOSIS — Z23 Encounter for immunization: Secondary | ICD-10-CM | POA: Diagnosis not present

## 2020-09-12 DIAGNOSIS — G43719 Chronic migraine without aura, intractable, without status migrainosus: Secondary | ICD-10-CM | POA: Diagnosis not present

## 2020-09-12 MED ORDER — SUMATRIPTAN SUCCINATE 50 MG PO TABS: 50.0000 mg | ORAL_TABLET | ORAL | 1 refills | Status: DC | PRN

## 2020-09-14 ENCOUNTER — Encounter (INDEPENDENT_AMBULATORY_CARE_PROVIDER_SITE_OTHER): Payer: Self-pay | Admitting: Adult Health

## 2020-09-14 ENCOUNTER — Other Ambulatory Visit: Payer: Self-pay

## 2020-09-14 ENCOUNTER — Ambulatory Visit (INDEPENDENT_AMBULATORY_CARE_PROVIDER_SITE_OTHER): Payer: 59 | Admitting: Adult Health

## 2020-09-14 VITALS — BP 134/82 | HR 71 | Temp 97.4°F | Ht 62.0 in | Wt 171.0 lb

## 2020-09-14 DIAGNOSIS — E8881 Metabolic syndrome: Secondary | ICD-10-CM

## 2020-09-14 DIAGNOSIS — Z6839 Body mass index (BMI) 39.0-39.9, adult: Secondary | ICD-10-CM

## 2020-09-14 DIAGNOSIS — Z9189 Other specified personal risk factors, not elsewhere classified: Secondary | ICD-10-CM

## 2020-09-14 DIAGNOSIS — E66812 Obesity, class 2: Secondary | ICD-10-CM

## 2020-09-14 DIAGNOSIS — E559 Vitamin D deficiency, unspecified: Secondary | ICD-10-CM | POA: Diagnosis not present

## 2020-09-14 DIAGNOSIS — E88819 Insulin resistance, unspecified: Secondary | ICD-10-CM

## 2020-09-14 MED ORDER — TRULICITY 3 MG/0.5ML ~~LOC~~ SOAJ: 3.0000 mg | SUBCUTANEOUS | 0 refills | Status: DC

## 2020-09-14 NOTE — Progress Notes (Signed)
Chief Complaint:   OBESITY Brooke [name] is here to discuss her progress with her obesity treatment plan along with follow-up of her obesity related diagnoses. Brooke [name] is on the [employer] and states she is following her eating plan approximately 100% of the time. Brooke [name] states she is walking for 3 miles 3 times per week.  Today's visit was #: 15 Starting weight: 217 lbs Starting date: 09/14/2019 Today's weight: 171 lbs Today's date: 09/14/2020 Total lbs lost to date: 46 lbs Total lbs lost since last in-office visit: 6 lbs  Interim History: Brooke [name] continues to enjoy the food/structure of the [employer].   On 09/27/2020, she is having total laparoscopic hysterectomy with salpingectomy (bilateral) with Dr. Nelda Marseille.  Subjective:   1. Vitamin D deficiency Vitamin D level on 04/22/2020 - 56.4.  She is currently taking prescription ergocalciferol 50,000 IU each week. She denies nausea, vomiting or muscle weakness.  Lab Results  Component Value Date   VD25OH 56.4 03/24/2020   VD25OH 41.8 09/14/2019   VD25OH 28 (L) 08/05/2019   2. Insulin resistance On 08/26/2020, A1c was 5.2. On 03/24/2020, insulin level was 5.1. She reports chronic nausea/constipation prior to GLP-1 therapy.   Nausea/constipation sx's still present.  Lab Results  Component Value Date   INSULIN 5.1 03/24/2020   INSULIN 5.9 09/14/2019   Lab Results  Component Value Date   HGBA1C 5.2 08/26/2020   3. At risk for deficient intake of food The patient is at a higher than average risk of deficient intake of food due nausea/constipation.  Assessment/Plan:   1. Vitamin D deficiency Check vitamin D level today and refill after labs.  - VITAMIN D 25 Hydroxy (Vit-D Deficiency, Fractures)  2. Insulin resistance Refill Trulicity 3 mg once weekly, as per below. Check insulin level today.  - Refill Dulaglutide (TRULICITY) 3 0000000 SOPN; Inject 3 mg into the skin once a week. Sundays  Dispense: 2 mL; Refill: 0 -  Insulin, random  3. At risk for deficient intake of food Brooke [name] was given approximately 15 minutes of deficit intake of food prevention counseling today. Brooke [name] is at risk for eating too few calories based on current food recall. She was encouraged to focus on meeting caloric and protein goals according to her recommended meal plan.    4. Class 2 severe obesity with serious comorbidity and body mass index (BMI) of 39.0 to 39.9 in adult, unspecified obesity type ([place]), WITH CURRENT BMI 31.4  Brooke [name] is currently in the action stage of change. As such, her goal is to continue with weight loss efforts. She has agreed to the [employer].   Exercise goals:  As is.  Behavioral modification strategies: increasing lean protein intake, decreasing simple carbohydrates, meal planning and cooking strategies, keeping healthy foods in the home, and planning for success.  Brooke [name] has agreed to follow-up with our clinic in 3-4 weeks. She was informed of the importance of frequent follow-up visits to maximize her success with intensive lifestyle modifications for her multiple health conditions.   Increase protein prior to and after surgery.  Brooke [name] was informed we would discuss her lab results at her next visit unless there is a critical issue that needs to be addressed sooner. Brooke [name] agreed to keep her next visit at the agreed upon time to discuss these results.  Objective:   Blood pressure 134/82, pulse 71, temperature (!) 97.4 F (36.3 C), height '5\' 2"'$  (1.575 m), weight 171 lb (77.6 kg), last menstrual period 09/01/2020, SpO2 99 %.  Body mass index is 31.28 kg/m.  General: Cooperative, alert, well developed, in no acute distress. HEENT: Conjunctivae and lids unremarkable. Cardiovascular: Regular rhythm.  Lungs: Normal work of breathing. Neurologic: No focal deficits.   Lab Results  Component Value Date   CREATININE 0.72 09/07/2020   BUN 8 09/07/2020   NA 139 09/07/2020   K 3.8 09/07/2020    CL 104 09/07/2020   CO2 27 09/07/2020   Lab Results  Component Value Date   ALT 21 08/26/2020   AST 20 08/26/2020   ALKPHOS 68 08/26/2020   BILITOT 0.4 08/26/2020   Lab Results  Component Value Date   HGBA1C 5.2 08/26/2020   HGBA1C 5.1 03/24/2020   HGBA1C 5.2 09/14/2019   HGBA1C 5.3 08/05/2019   Lab Results  Component Value Date   INSULIN 5.1 03/24/2020   INSULIN 5.9 09/14/2019   Lab Results  Component Value Date   TSH 1.390 09/14/2019   Lab Results  Component Value Date   CHOL 193 03/24/2020   HDL 64 03/24/2020   LDLCALC 117 (H) 03/24/2020   TRIG 66 03/24/2020   CHOLHDL 3.0 03/24/2020   Lab Results  Component Value Date   VD25OH 56.4 03/24/2020   VD25OH 41.8 09/14/2019   VD25OH 28 (L) 08/05/2019   Lab Results  Component Value Date   WBC 4.7 09/07/2020   HGB 12.4 09/07/2020   HCT 38.4 09/07/2020   MCV 98.7 09/07/2020   PLT 347 09/07/2020   Lab Results  Component Value Date   IRON 111 09/03/2019   TIBC 292 09/03/2019   FERRITIN 187 09/03/2019   Attestation Statements:   Reviewed by clinician on day of visit: allergies, medications, problem list, medical history, surgical history, family history, social history, and previous encounter notes.  I, Water quality scientist, CMA, am acting as Location manager for Mina Marble, NP.  I have reviewed the above documentation for accuracy and completeness, and I agree with the above. -  Kroy Sprung d. Kiesha Ensey, NP-C

## 2020-09-15 ENCOUNTER — Encounter (INDEPENDENT_AMBULATORY_CARE_PROVIDER_SITE_OTHER): Payer: Self-pay | Admitting: Adult Health

## 2020-09-15 ENCOUNTER — Ambulatory Visit (HOSPITAL_COMMUNITY)
Admission: RE | Admit: 2020-09-15 | Discharge: 2020-09-15 | Disposition: A | Discharge status: Home / Self Care | Payer: 59 | Source: Ambulatory Visit | Attending: Internal Medicine | Admitting: Internal Medicine

## 2020-09-15 ENCOUNTER — Other Ambulatory Visit (INDEPENDENT_AMBULATORY_CARE_PROVIDER_SITE_OTHER): Payer: Self-pay | Admitting: Adult Health

## 2020-09-15 DIAGNOSIS — M542 Cervicalgia: Secondary | ICD-10-CM

## 2020-09-15 LAB — VITAMIN D 25 HYDROXY (VIT D DEFICIENCY, FRACTURES): Vit D, 25-Hydroxy: 80.1 ng/mL (ref 30.0–100.0)

## 2020-09-15 LAB — INSULIN, RANDOM: INSULIN: 5 u[IU]/mL (ref 2.6–24.9)

## 2020-09-16 ENCOUNTER — Encounter: Payer: Self-pay | Admitting: Internal Medicine

## 2020-09-16 DIAGNOSIS — Z09 Encounter for follow-up examination after completed treatment for conditions other than malignant neoplasm: Secondary | ICD-10-CM | POA: Insufficient documentation

## 2020-09-16 DIAGNOSIS — M542 Cervicalgia: Secondary | ICD-10-CM | POA: Insufficient documentation

## 2020-09-16 NOTE — Assessment & Plan Note (Signed)
ER chart reviewed including lab results Presyncope likely due to dehydration vs atypical migraine

## 2020-09-16 NOTE — Assessment & Plan Note (Signed)
Sumatriptan PRN On Topiramate for ppx

## 2020-09-16 NOTE — Assessment & Plan Note (Signed)
Likely DDD of cervical spine Check X-ray Cervical spine Referred to Spine surgery Advised to apply heating pad or ice

## 2020-09-16 NOTE — Progress Notes (Signed)
Acute Office Visit  Subjective:    Patient ID: Brooke [name], female    DOB: 1970-08-29, 50 y.o.   MRN: [id number]  Chief Complaint  Patient presents with   Follow-up    6 month follow up pt went to ER last week was in store felt like she was going to pass out they told her migraines she does have headaches everyday however last week she noticed a knot on the back of her neck and she cant turn her head to that side     HPI Patient is in today for evaluation of cervical neck pain, which is chronic, sharp and intermittent. She has also noticed a knot over her neck/cervical spine area. She has difficulty turning her head due to neck pain. Denies any tingling or weakness of the UE.  She had an ER visit recently for an episode of presyncope, where she was told that her symptoms were due to migraine vs dehydration. She denies any similar episode since that day. She felt nauseous on that day. Denies any dizziness or vision problem currently. Denies any recent injury.   Past Medical History:  Diagnosis Date   Acid reflux    Amenorrhea 02/06/2012   Anxiety    Arthritis    [name] 10/13/2019   B12 deficiency    Breast lump 08/06/2019   Cervical radiculitis    Chest pain    Chronic constipation    DDD (degenerative disc disease), lumbar    Depression    Depression    [name] 10/13/2019   Dizzy spells    Elevated vitamin B12 level 05/04/2019   Esophageal dysphagia 11/19/2012   Facial numbness    Family history of systemic lupus erythematosus 10/22/2019   Fatty liver    GERD (gastroesophageal reflux disease)    [name] 10/13/2019   Headache(784.0) 04/01/2012   Heart palpitations 01/2017   High cholesterol    History of anemia    History of hiatal hernia    Hypersomnia due to another medical condition 06/12/2017   Hypertension    Insomnia    Intractable migraine with visual aura and without status migrainosus 01/23/2017   Iron deficiency anemia    Irregular periods 08/06/2019   Joint  pain    Lactose intolerance    LUQ pain 11/19/2012   Menopausal symptom 08/06/2019   Migraines    occ   Near syncope 01/2017   Numbness and tingling 10/08/2016   Formatting of this note might be different from the original. ---Oct 2018-TEE----Normal left ventricular size and systolic function with no appreciable segmental abnormality. EF 60% There was no evidence of spontaneous echo contrast or thrombus in the left atrium or left atrial appendage. No significant valvular abnormalites noted Bubble study performed, this is negative.   Numbness and tingling in left arm    Obesity    Other malaise and fatigue 05/19/2012   Panic attacks    Raynaud's phenomenon without gangrene 10/22/2019   Sciatica    Seizures (HCC)    Slurred speech 11/07/2016   Formatting of this note might be different from the original. ---Oct 2018-TEE----Normal left ventricular size and systolic function with no appreciable segmental abnormality. EF 60% There was no evidence of spontaneous echo contrast or thrombus in the left atrium or left atrial appendage. No significant valvular abnormalites noted Bubble study performed, this is negative.   Small bowel obstruction (HCC) 07/20/2017   Spells of speech arrest 01/23/2017   Transient cerebral ischemia 10/08/2016   Formatting of  this note might be different from the original. ---Oct 2018-TEE----Normal left ventricular size and systolic function with no appreciable segmental abnormality. EF 60% There was no evidence of spontaneous echo contrast or thrombus in the left atrium or left atrial appendage. No significant valvular abnormalites noted Bubble study performed, this is negative.   Vitamin D deficiency    Word finding difficulty 01/23/2017    Past Surgical History:  Procedure Laterality Date   BUNIONECTOMY Left yrs ago   CERVICAL ABLATION  2017   COLONOSCOPY, ESOPHAGOGASTRODUODENOSCOPY (EGD) AND ESOPHAGEAL DILATION N/A 12/03/2012   ION:GEXBMWUX melanosis throughout the entire  examined colon/The colon IS redundant/Small internal hemorrhoids/EGD:Esophageal web/Medium sized hiatal hernia/MILD Non-erosive gastritis   ESOPHAGOGASTRODUODENOSCOPY  03/09/09   Dr. Wilford Corner, normal EGD, s/p Bravo capsule placement   ESOPHAGOGASTRODUODENOSCOPY N/A 06/24/2019   rourk: Status post gastric bypass procedure, normal esophagus status post dilation   EYE SURGERY N/A    [name] 10/13/2019   GASTRIC ROUX-EN-Y N/A 07/16/2017   Procedure: LAPAROSCOPIC ROUX-EN-Y GASTRIC BYPASS WITH UPPER ENDOSCOPY AND ERAS PATHWAY;  Surgeon: Johnathan Hausen, MD;  Location: [facility];  Service: General;  Laterality: N/A;   LAPAROSCOPY N/A 07/20/2017   Procedure: LAPAROSCOPY DIAGNOSTIC. REDUCTION OF SMALL BOWEL OBSTRUCTION. REPAIR OF TROCAR HERNIA.;  Surgeon: Alphonsa Overall, MD;  Location: [facility];  Service: General;  Laterality: N/A;   MALONEY DILATION N/A 06/24/2019   Procedure: Keturah Shavers;  Surgeon: Daneil Dolin, MD;  Location: [facility];  Service: Endoscopy;  Laterality: N/A;   TUBAL LIGATION     WISDOM TOOTH EXTRACTION      Family History  Problem Relation Age of Onset   Diabetes Mother    Hypertension Mother    Drug abuse Mother    Anxiety disorder Mother    Depression Mother    CAD Mother        CABG in 55s   Lung cancer Mother    Hyperlipidemia Mother    Heart disease Mother    Cancer Mother    Obesity Mother    Hypertension Father    Sudden death Father    Diabetes Sister    Hypertension Sister    Hypertension Brother    Drug abuse Brother    CAD Brother        s/p CABG in 19s   Diabetes Paternal Grandmother    Hypertension Brother    Drug abuse Brother    CAD Brother        "HEart artery blockages" in 51s   Hypertension Brother    Drug abuse Brother    Anxiety disorder Maternal Grandmother    Depression Maternal Grandmother    Breast cancer Maternal Grandmother        breast   Asthma Other    Heart disease Other    Colon cancer Neg Hx    Gastric cancer Neg  Hx    Esophageal cancer Neg Hx     Social History   Socioeconomic History   Marital status: Married    Spouse name: Randall Hiss    Number of children: 2   Years of education: Not on file   Highest education level: Some college, no degree  Occupational History   Occupation: stay at home  Tobacco Use   Smoking status: Former    Packs/day: 0.30    Years: 23.00    Pack years: 6.90    Types: Cigarettes    Quit date: 10/04/2012    Years since quitting: 7.9   Smokeless tobacco: Never  Tobacco comments:    less than 1/2 pack cigarettes daily  Vaping Use   Vaping Use: Never used  Substance and Sexual Activity   Alcohol use: Yes    Comment: weekends; hardly/social   Drug use: No   Sexual activity: Yes    Partners: Male    Birth control/protection: Surgical  Other Topics Concern   Not on file  Social History Narrative   Lives with husband, married 26 years    20 son Teron    11 son Leslee Home -two grandchildren    Live close by    Rising cousin-custody of her daughter 75 [address]       Right handed   Pets: none      Enjoys: [employer], shopping, likes being outside       Diet: eggs, oatmeal, salad, all food groups no lot of proteins, good on veggies.    Caffeine: sweet tea-2 cups  Coffee-1 cup daily    Water: 2-3 16 oz bottles daily       Wears seat belt    Smoke and carbon monoxide detectors   Does use phone while driving but hands free   Social Determinants of Health   Financial Resource Strain: Not on file  Food Insecurity: Not on file  Transportation Needs: Not on file  Physical Activity: Not on file  Stress: Not on file  Social Connections: Not on file  Intimate Partner Violence: Not on file    Outpatient Medications Prior to Visit  Medication Sig Dispense Refill   acetaminophen (TYLENOL) 500 MG tablet Take 1,000 mg by mouth daily as needed for moderate pain or headache.     Brexpiprazole (REXULTI) 0.5 MG TABS Take 1 tablet (0.5 mg total) by mouth daily. 30 tablet 1    calcium carbonate (OSCAL) 1500 (600 Ca) MG TABS tablet Take 1,500 mg by mouth daily.     diclofenac Sodium (VOLTAREN) 1 % GEL Apply 1 g topically 3 (three) times daily as needed for pain.     fluticasone (FLONASE) 50 MCG/ACT nasal spray Place 2 sprays into both nostrils daily. 16 g 1   levETIRAcetam (KEPPRA) 250 MG tablet Take 1 tablet (250 mg total) by mouth at bedtime. 30 tablet 0   levocetirizine (XYZAL) 5 MG tablet Take 1 tablet (5 mg total) by mouth every evening. 30 tablet 2   Multiple Vitamins-Minerals (BARIATRIC MULTIVITAMINS/IRON PO) Take 1 tablet by mouth daily.      pantoprazole (PROTONIX) 40 MG tablet TAKE 1 TABLET BY MOUTH TWICE DAILY (Patient taking differently: Take 40 mg by mouth 2 (two) times daily.) 60 tablet 5   topiramate (TOPAMAX) 100 MG tablet Take 1 tablet (100 mg total) by mouth 2 (two) times daily. 60 tablet 0   [START ON 09/21/2020] venlafaxine XR (EFFEXOR-XR) 150 MG 24 hr capsule Total of 225 mg along with 75 mg tab 90 capsule 1   venlafaxine XR (EFFEXOR-XR) 75 MG 24 hr capsule 225 mg daily. Take along with 150 mg cap 90 capsule 1   TRULICITY 3 MG/0.5ML SOPN INJECT 3 MG INTO THE SKIN ONCE A WEEK (Patient taking differently: Inject 3 mg into the skin once a week. Sundays) 2 mL 0   Vitamin D, Ergocalciferol, (DRISDOL) 1.25 MG (50000 UNIT) CAPS capsule Take 1 capsule (50,000 Units total) by mouth every 7 (seven) days. (Patient taking differently: Take 50,000 Units by mouth every Sunday.) 12 capsule 1   No facility-administered medications prior to visit.    No Known Allergies  Review of Systems  Constitutional:  Negative for chills and fever.  HENT:  Negative for congestion and sore throat.   Eyes:  Negative for redness and visual disturbance.  Respiratory:  Negative for cough and shortness of breath.   Cardiovascular:  Negative for chest pain and palpitations.  Gastrointestinal:  Negative for constipation, nausea and vomiting.  Musculoskeletal:  Positive for myalgias  and neck pain.  Skin:  Negative for rash.  Neurological:  Negative for dizziness and weakness.  Psychiatric/Behavioral:  Negative for agitation and behavioral problems.       Objective:    Physical Exam Constitutional:      General: She is not in acute distress.    Appearance: She is not diaphoretic.  HENT:     Head: Normocephalic and atraumatic.     Mouth/Throat:     Mouth: Mucous membranes are moist.  Eyes:     General: No scleral icterus.    Extraocular Movements: Extraocular movements intact.  Cardiovascular:     Rate and Rhythm: Normal rate and regular rhythm.     Pulses: Normal pulses.     Heart sounds: Normal heart sounds. No murmur heard. Pulmonary:     Breath sounds: Normal breath sounds. No wheezing or rales.  Musculoskeletal:     Cervical back: Neck supple. Tenderness (Mild over cervical spine area) present.  Skin:    General: Skin is warm.     Comments: About 1 cm in diameter lipoma over left clavicular border  Neurological:     General: No focal deficit present.     Mental Status: She is alert and oriented to person, place, and time.    BP 118/81 (BP Location: Left Arm, Patient Position: Sitting, Cuff Size: Normal)   Pulse 82   Temp 98 F (36.7 C) (Oral)   Resp 18   Ht $R'5\' 2"'SB$  (1.575 m)   Wt 174 lb 0.6 oz (78.9 kg)   LMP 09/01/2020 (Approximate)   SpO2 95%   BMI 31.83 kg/m  Wt Readings from Last 3 Encounters:  09/14/20 171 lb (77.6 kg)  09/12/20 174 lb 0.6 oz (78.9 kg)  09/07/20 176 lb (79.8 kg)    Health Maintenance Due  Topic Date Due   COVID-19 Vaccine (3 - Pfizer risk series) 05/01/2019    There are no preventive care reminders to display for this patient.   Lab Results  Component Value Date   TSH 1.390 09/14/2019   Lab Results  Component Value Date   WBC 4.7 09/07/2020   HGB 12.4 09/07/2020   HCT 38.4 09/07/2020   MCV 98.7 09/07/2020   PLT 347 09/07/2020   Lab Results  Component Value Date   NA 139 09/07/2020   K 3.8  09/07/2020   CO2 27 09/07/2020   GLUCOSE 86 09/07/2020   BUN 8 09/07/2020   CREATININE 0.72 09/07/2020   BILITOT 0.4 08/26/2020   ALKPHOS 68 08/26/2020   AST 20 08/26/2020   ALT 21 08/26/2020   PROT 6.8 08/26/2020   ALBUMIN 3.8 08/26/2020   CALCIUM 8.2 (L) 09/07/2020   ANIONGAP 8 09/07/2020   EGFR 90 03/24/2020   Lab Results  Component Value Date   CHOL 193 03/24/2020   Lab Results  Component Value Date   HDL 64 03/24/2020   Lab Results  Component Value Date   LDLCALC 117 (H) 03/24/2020   Lab Results  Component Value Date   TRIG 66 03/24/2020   Lab Results  Component Value Date   CHOLHDL 3.0  03/24/2020   Lab Results  Component Value Date   HGBA1C 5.2 08/26/2020       Assessment & Plan:   Problem List Items Addressed This Visit       Cardiovascular and Mediastinum   Intractable chronic migraine without aura and without status migrainosus    Sumatriptan PRN On Topiramate for ppx      Relevant Medications   SUMAtriptan (IMITREX) 50 MG tablet     Other   Cervical pain (neck) - Primary    Likely DDD of cervical spine Check X-ray Cervical spine Referred to Spine surgery Advised to apply heating pad or ice      Relevant Orders   DG Cervical Spine Complete (Completed)   Ambulatory referral to Spine Surgery   Encounter for examination following treatment at hospital    ER chart reviewed including lab results Presyncope likely due to dehydration vs atypical migraine      Other Visit Diagnoses     Need for immunization against influenza       Relevant Orders   Flu Vaccine QUAD 53mo+IM (Fluarix, Fluzone & Alfiuria Quad PF) (Completed)        Meds ordered this encounter  Medications   SUMAtriptan (IMITREX) 50 MG tablet    Sig: Take 1 tablet (50 mg total) by mouth every 2 (two) hours as needed for migraine. May repeat in 2 hours if headache persists or recurs.    Dispense:  10 tablet    Refill:  1     Denham Mose Keith Rake, MD

## 2020-09-19 NOTE — Patient Instructions (Signed)
Brooke [name]  09/19/2020     '@PREFPERIOPPHARMACY'$ @   Your procedure is scheduled on  09/27/2020.   Report to [facility] at  0800 A.M.   Call this number if you have problems the morning of surgery:  [phone]   Remember:  Do not eat or drink after midnight.      Take these medicines the morning of surgery with A SIP OF WATER     rexulti, protonix, topamax, imitrex(if needed), effexor.     Do not wear jewelry, make-up or nail polish.  Do not wear lotions, powders, or perfumes, or deodorant.  Do not shave 48 hours prior to surgery.  Men may shave face and neck.  Do not bring valuables to the hospital.  [facility] is not responsible for any belongings or valuables.  Contacts, dentures or bridgework may not be worn into surgery.  Leave your suitcase in the car.  After surgery it may be brought to your room.  For patients admitted to the hospital, discharge time will be determined by your treatment team.  Patients discharged the day of surgery will not be allowed to drive home and must have someone with them for 24 hours.    Special instructions:   DO NOT smoke tobacco or vape for 24 hours before your procedure.  Please read over the following fact sheets that you were given. Coughing and Deep Breathing, Surgical Site Infection Prevention, Anesthesia Post-op Instructions, and Care and Recovery After Surgery      Total Laparoscopic Hysterectomy, Care After The following information offers guidance on how to care for yourself after your procedure. Your health care provider may also give you more specific instructions. If you have problems or questions, contact your health care provider. What can I expect after the procedure? After the procedure, it is common to have: Pain, bruising, and numbness around your incisions. Tiredness (fatigue). Poor appetite. Less interest in sex. Vaginal discharge or bleeding. You will need to use a sanitary pad after this  procedure. Feelings of sadness or other emotions. If your ovaries were also removed, it is also common to have symptoms of menopause, such as hot flashes, night sweats, and lack of sleep (insomnia). Follow these instructions at home: Medicines Take over-the-counter and prescription medicines only as told by your health care provider. Ask your health care provider if the medicine prescribed to you: Requires you to avoid driving or using machinery. Can cause constipation. You may need to take these actions to prevent or treat constipation: Drink enough fluid to keep your urine pale yellow. Take over-the-counter or prescription medicines. Eat foods that are high in fiber, such as beans, whole grains, and fresh fruits and vegetables. Limit foods that are high in fat and processed sugars, such as fried or sweet foods. Incision care  Follow instructions from your health care provider about how to take care of your incisions. Make sure you: Wash your hands with soap and water for at least 20 seconds before and after you change your bandage (dressing). If soap and water are not available, use hand sanitizer. Change your dressing as told by your health care provider. Leave stitches (sutures), skin glue, or adhesive strips in place. These skin closures may need to stay in place for 2 weeks or longer. If adhesive strip edges start to loosen and curl up, you may trim the loose edges. Do not remove adhesive strips completely unless your health care provider tells you  to do that. Check your incision areas every day for signs of infection. Check for: More redness, swelling, or pain. Fluid or blood. Warmth. Pus or a bad smell. Activity  Rest as told by your health care provider. Avoid sitting for a long time without moving. Get up to take short walks every 1-2 hours. This is important to improve blood flow and breathing. Ask for help if you feel weak or unsteady. Return to your normal activities as told  by your health care provider. Ask your health care provider what activities are safe for you. Do not lift anything that is heavier than 10 lb (4.5 kg), or the limit that you are told, for one month after surgery or until your health care provider says that it is safe. If you were given a sedative during the procedure, it can affect you for several hours. Do not drive or operate machinery until your health care provider says that it is safe. Lifestyle Do not use any products that contain nicotine or tobacco. These products include cigarettes, chewing tobacco, and vaping devices, such as e-cigarettes. These can delay healing after surgery. If you need help quitting, ask your health care provider. Do not drink alcohol until your health care provider approves. General instructions  Do not douche, use tampons, or have sex for at least 6 weeks, or as told by your health care provider. If you struggle with physical or emotional changes after your procedure, speak with your health care provider or a therapist. Do not take baths, swim, or use a hot tub until your health care provider approves. You may only be allowed to take showers for 2-3 weeks. Keep your dressing dry until your health care provider says it can be removed. Try to have someone at home with you for the first 1-2 weeks to help with your daily chores. Wear compression stockings as told by your health care provider. These stockings help to prevent blood clots and reduce swelling in your legs. Keep all follow-up visits. This is important. Contact a health care provider if: You have any of these signs of infection: Chills or a fever. More redness, swelling, or pain around an incision. Fluid or blood coming from an incision. Warmth coming from an incision. Pus or a bad smell coming from an incision. An incision opens. You feel dizzy or light-headed. You have pain or bleeding when you urinate, or you are unable to urinate. You have abnormal  vaginal discharge. You have pain that does not get better with medicine. Get help right away if: You have a fever and your symptoms suddenly get worse. You have severe abdominal pain. You have chest pain or shortness of breath. You faint. You have pain, swelling, or redness in your leg. You have heavy vaginal bleeding with blood clots, soaking through a sanitary pad in less than 1 hour. These symptoms may represent a serious problem that is an emergency. Do not wait to see if the symptoms will go away. Get medical help right away. Call your local emergency services (911 in the [place] Do not drive yourself to the hospital. Summary After the procedure, it is common to have pain and bruising around your incisions. Do not take baths, swim, or use a hot tub until your health care provider approves. Do not lift anything that is heavier than 10 lb (4.5 kg), or the limit that you are told, for one month after surgery or until your health care provider says that it is safe. Tell  your health care provider if you have any signs or symptoms of infection after the procedure. Get help right away if you have severe abdominal pain, chest pain, shortness of breath, or heavy bleeding from your vagina. This information is not intended to replace advice given to you by your health care provider. Make sure you discuss any questions you have with your health care provider. Document Revised: 08/21/2019 Document Reviewed: 08/21/2019 Elsevier Patient Education  [place] Anesthesia, Adult, Care After This sheet gives you information about how to care for yourself after your procedure. Your health care provider may also give you more specific instructions. If you have problems or questions, contact your health care provider. What can I expect after the procedure? After the procedure, the following side effects are common: Pain or discomfort at the IV site. Nausea. Vomiting. Sore throat. Trouble  concentrating. Feeling cold or chills. Feeling weak or tired. Sleepiness and fatigue. Soreness and body aches. These side effects can affect parts of the body that were not involved in surgery. Follow these instructions at home: For the time period you were told by your health care provider:  Rest. Do not participate in activities where you could fall or become injured. Do not drive or use machinery. Do not drink alcohol. Do not take sleeping pills or medicines that cause drowsiness. Do not make important decisions or sign legal documents. Do not take care of children on your own. Eating and drinking Follow any instructions from your health care provider about eating or drinking restrictions. When you feel hungry, start by eating small amounts of foods that are soft and easy to digest (bland), such as toast. Gradually return to your regular diet. Drink enough fluid to keep your urine pale yellow. If you vomit, rehydrate by drinking water, juice, or clear broth. General instructions If you have sleep apnea, surgery and certain medicines can increase your risk for breathing problems. Follow instructions from your health care provider about wearing your sleep device: Anytime you are sleeping, including during daytime naps. While taking prescription pain medicines, sleeping medicines, or medicines that make you drowsy. Have a responsible adult stay with you for the time you are told. It is important to have someone help care for you until you are awake and alert. Return to your normal activities as told by your health care provider. Ask your health care provider what activities are safe for you. Take over-the-counter and prescription medicines only as told by your health care provider. If you smoke, do not smoke without supervision. Keep all follow-up visits as told by your health care provider. This is important. Contact a health care provider if: You have nausea or vomiting that does not  get better with medicine. You cannot eat or drink without vomiting. You have pain that does not get better with medicine. You are unable to pass urine. You develop a skin rash. You have a fever. You have redness around your IV site that gets worse. Get help right away if: You have difficulty breathing. You have chest pain. You have blood in your urine or stool, or you vomit blood. Summary After the procedure, it is common to have a sore throat or nausea. It is also common to feel tired. Have a responsible adult stay with you for the time you are told. It is important to have someone help care for you until you are awake and alert. When you feel hungry, start by eating small amounts of foods that are  soft and easy to digest (bland), such as toast. Gradually return to your regular diet. Drink enough fluid to keep your urine pale yellow. Return to your normal activities as told by your health care provider. Ask your health care provider what activities are safe for you. This information is not intended to replace advice given to you by your health care provider. Make sure you discuss any questions you have with your health care provider. Document Revised: 09/03/2019 Document Reviewed: 04/02/2019 Elsevier Patient Education  2022 [employer]. How to Use Chlorhexidine for Bathing Chlorhexidine gluconate (CHG) is a germ-killing (antiseptic) solution that is used to clean the skin. It can get rid of the bacteria that normally live on the skin and can keep them away for about 24 hours. To clean your skin with CHG, you may be given: A CHG solution to use in the shower or as part of a sponge bath. A prepackaged cloth that contains CHG. Cleaning your skin with CHG may help lower the risk for infection: While you are staying in the intensive care unit of the hospital. If you have a vascular access, such as a central line, to provide short-term or long-term access to your veins. If you have a catheter  to drain urine from your bladder. If you are on a ventilator. A ventilator is a machine that helps you breathe by moving air in and out of your lungs. After surgery. What are the risks? Risks of using CHG include: A skin reaction. Hearing loss, if CHG gets in your ears and you have a perforated eardrum. Eye injury, if CHG gets in your eyes and is not rinsed out. The CHG product catching fire. Make sure that you avoid smoking and flames after applying CHG to your skin. Do not use CHG: If you have a chlorhexidine allergy or have previously reacted to chlorhexidine. On babies younger than 55 months of age. How to use CHG solution Use CHG only as told by your health care provider, and follow the instructions on the label. Use the full amount of CHG as directed. Usually, this is one bottle. During a shower Follow these steps when using CHG solution during a shower (unless your health care provider gives you different instructions): Start the shower. Use your normal soap and shampoo to wash your face and hair. Turn off the shower or move out of the shower stream. Pour the CHG onto a clean washcloth. Do not use any type of brush or rough-edged sponge. Starting at your neck, lather your body down to your toes. Make sure you follow these instructions: If you will be having surgery, pay special attention to the part of your body where you will be having surgery. Scrub this area for at least 1 minute. Do not use CHG on your head or face. If the solution gets into your ears or eyes, rinse them well with water. Avoid your genital area. Avoid any areas of skin that have broken skin, cuts, or scrapes. Scrub your back and under your arms. Make sure to wash skin folds. Let the lather sit on your skin for 1-2 minutes or as long as told by your health care provider. Thoroughly rinse your entire body in the shower. Make sure that all body creases and crevices are rinsed well. Dry off with a clean towel. Do  not put any substances on your body afterward--such as powder, lotion, or perfume--unless you are told to do so by your health care provider. Only use lotions that  are recommended by the manufacturer. Put on clean clothes or pajamas. If it is the night before your surgery, sleep in clean sheets.  During a sponge bath Follow these steps when using CHG solution during a sponge bath (unless your health care provider gives you different instructions): Use your normal soap and shampoo to wash your face and hair. Pour the CHG onto a clean washcloth. Starting at your neck, lather your body down to your toes. Make sure you follow these instructions: If you will be having surgery, pay special attention to the part of your body where you will be having surgery. Scrub this area for at least 1 minute. Do not use CHG on your head or face. If the solution gets into your ears or eyes, rinse them well with water. Avoid your genital area. Avoid any areas of skin that have broken skin, cuts, or scrapes. Scrub your back and under your arms. Make sure to wash skin folds. Let the lather sit on your skin for 1-2 minutes or as long as told by your health care provider. Using a different clean, wet washcloth, thoroughly rinse your entire body. Make sure that all body creases and crevices are rinsed well. Dry off with a clean towel. Do not put any substances on your body afterward--such as powder, lotion, or perfume--unless you are told to do so by your health care provider. Only use lotions that are recommended by the manufacturer. Put on clean clothes or pajamas. If it is the night before your surgery, sleep in clean sheets. How to use CHG prepackaged cloths Only use CHG cloths as told by your health care provider, and follow the instructions on the label. Use the CHG cloth on clean, dry skin. Do not use the CHG cloth on your head or face unless your health care provider tells you to. When washing with the CHG  cloth: Avoid your genital area. Avoid any areas of skin that have broken skin, cuts, or scrapes. Before surgery Follow these steps when using a CHG cloth to clean before surgery (unless your health care provider gives you different instructions): Using the CHG cloth, vigorously scrub the part of your body where you will be having surgery. Scrub using a back-and-forth motion for 3 minutes. The area on your body should be completely wet with CHG when you are done scrubbing. Do not rinse. Discard the cloth and let the area air-dry. Do not put any substances on the area afterward, such as powder, lotion, or perfume. Put on clean clothes or pajamas. If it is the night before your surgery, sleep in clean sheets.  For general bathing Follow these steps when using CHG cloths for general bathing (unless your health care provider gives you different instructions). Use a separate CHG cloth for each area of your body. Make sure you wash between any folds of skin and between your fingers and toes. Wash your body in the following order, switching to a new cloth after each step: The front of your neck, shoulders, and chest. Both of your arms, under your arms, and your hands. Your stomach and groin area, avoiding the genitals. Your right leg and foot. Your left leg and foot. The back of your neck, your back, and your buttocks. Do not rinse. Discard the cloth and let the area air-dry. Do not put any substances on your body afterward--such as powder, lotion, or perfume--unless you are told to do so by your health care provider. Only use lotions that are recommended by  the manufacturer. Put on clean clothes or pajamas. Contact a health care provider if: Your skin gets irritated after scrubbing. You have questions about using your solution or cloth. You swallow any chlorhexidine. Call your local poison control center (1-309 450 0224 in the [place] Get help right away if: Your eyes itch badly, or they become  very red or swollen. Your skin itches badly and is red or swollen. Your hearing changes. You have trouble seeing. You have swelling or tingling in your mouth or throat. You have trouble breathing. These symptoms may represent a serious problem that is an emergency. Do not wait to see if the symptoms will go away. Get medical help right away. Call your local emergency services (911 in the [place] Do not drive yourself to the hospital. Summary Chlorhexidine gluconate (CHG) is a germ-killing (antiseptic) solution that is used to clean the skin. Cleaning your skin with CHG may help to lower your risk for infection. You may be given CHG to use for bathing. It may be in a bottle or in a prepackaged cloth to use on your skin. Carefully follow your health care provider's instructions and the instructions on the product label. Do not use CHG if you have a chlorhexidine allergy. Contact your health care provider if your skin gets irritated after scrubbing. This information is not intended to replace advice given to you by your health care provider. Make sure you discuss any questions you have with your health care provider. Document Revised: 02/29/2020 Document Reviewed: 02/29/2020 Elsevier Patient Education  2022 [employer].

## 2020-09-21 ENCOUNTER — Ambulatory Visit (HOSPITAL_COMMUNITY)
Admission: RE | Admit: 2020-09-21 | Discharge: 2020-09-21 | Disposition: A | Discharge status: Home / Self Care | Payer: 59 | Source: Ambulatory Visit | Attending: Nurse Practitioner | Admitting: Nurse Practitioner

## 2020-09-21 ENCOUNTER — Other Ambulatory Visit: Payer: Self-pay

## 2020-09-21 DIAGNOSIS — R1319 Other dysphagia: Secondary | ICD-10-CM

## 2020-09-21 DIAGNOSIS — R140 Abdominal distension (gaseous): Secondary | ICD-10-CM

## 2020-09-21 DIAGNOSIS — K5909 Other constipation: Secondary | ICD-10-CM | POA: Diagnosis not present

## 2020-09-21 DIAGNOSIS — K219 Gastro-esophageal reflux disease without esophagitis: Secondary | ICD-10-CM | POA: Diagnosis present

## 2020-09-21 DIAGNOSIS — Z0271 Encounter for disability determination: Secondary | ICD-10-CM

## 2020-09-21 DIAGNOSIS — R110 Nausea: Secondary | ICD-10-CM

## 2020-09-22 ENCOUNTER — Encounter (HOSPITAL_COMMUNITY): Payer: Self-pay

## 2020-09-22 ENCOUNTER — Other Ambulatory Visit: Payer: Self-pay

## 2020-09-22 ENCOUNTER — Encounter (HOSPITAL_COMMUNITY)
Admission: RE | Admit: 2020-09-22 | Discharge: 2020-09-22 | Disposition: A | Discharge status: Home / Self Care | Payer: 59 | Source: Ambulatory Visit | Attending: Obstetrics & Gynecology | Admitting: Obstetrics & Gynecology

## 2020-09-22 DIAGNOSIS — Z01812 Encounter for preprocedural laboratory examination: Secondary | ICD-10-CM | POA: Diagnosis present

## 2020-09-22 LAB — TYPE AND SCREEN
ABO/RH(D): O POS
Antibody Screen: NEGATIVE

## 2020-09-22 LAB — CBC WITH DIFFERENTIAL/PLATELET
Abs Immature Granulocytes: 0.01 10*3/uL (ref 0.00–0.07)
Basophils Absolute: 0 10*3/uL (ref 0.0–0.1)
Basophils Relative: 1 %
Eosinophils Absolute: 0.1 10*3/uL (ref 0.0–0.5)
Eosinophils Relative: 1 %
HCT: 38.8 % (ref 36.0–46.0)
Hemoglobin: 12.5 g/dL (ref 12.0–15.0)
Immature Granulocytes: 0 %
Lymphocytes Relative: 39 %
Lymphs Abs: 1.7 10*3/uL (ref 0.7–4.0)
MCH: 31.8 pg (ref 26.0–34.0)
MCHC: 32.2 g/dL (ref 30.0–36.0)
MCV: 98.7 fL (ref 80.0–100.0)
Monocytes Absolute: 0.3 10*3/uL (ref 0.1–1.0)
Monocytes Relative: 6 %
Neutro Abs: 2.4 10*3/uL (ref 1.7–7.7)
Neutrophils Relative %: 53 %
Platelets: 326 10*3/uL (ref 150–400)
RBC: 3.93 MIL/uL (ref 3.87–5.11)
RDW: 14.4 % (ref 11.5–15.5)
WBC: 4.4 10*3/uL (ref 4.0–10.5)
nRBC: 0 % (ref 0.0–0.2)

## 2020-09-22 LAB — BASIC METABOLIC PANEL
Anion gap: 5 (ref 5–15)
BUN: 9 mg/dL (ref 6–20)
CO2: 21 mmol/L — ABNORMAL LOW (ref 22–32)
Calcium: 8.2 mg/dL — ABNORMAL LOW (ref 8.9–10.3)
Chloride: 111 mmol/L (ref 98–111)
Creatinine, Ser: 0.77 mg/dL (ref 0.44–1.00)
GFR, Estimated: >60 mL/min (ref >60)
Glucose, Bld: 87 mg/dL (ref 70–99)
Potassium: 3.7 mmol/L (ref 3.5–5.1)
Sodium: 137 mmol/L (ref 135–145)

## 2020-09-22 LAB — HCG, SERUM, QUALITATIVE: Preg, Serum: NEGATIVE

## 2020-09-23 LAB — HEMOGLOBIN A1C
Hgb A1c MFr Bld: 5.3 % (ref 4.8–5.6)
Mean Plasma Glucose: 105 mg/dL

## 2020-09-27 ENCOUNTER — Ambulatory Visit (HOSPITAL_COMMUNITY)
Admission: RE | Admit: 2020-09-27 | Discharge: 2020-09-27 | Disposition: A | Discharge status: Home / Self Care | Payer: 59 | Attending: Obstetrics & Gynecology | Admitting: Obstetrics & Gynecology

## 2020-09-27 ENCOUNTER — Ambulatory Visit (HOSPITAL_COMMUNITY): Payer: 59 | Admitting: Anesthesiology

## 2020-09-27 ENCOUNTER — Encounter (HOSPITAL_COMMUNITY): Payer: Self-pay | Admitting: Obstetrics & Gynecology

## 2020-09-27 ENCOUNTER — Encounter (HOSPITAL_COMMUNITY)
Admission: RE | Disposition: A | Discharge status: Home / Self Care | Payer: Self-pay | Source: Home / Self Care | Attending: Obstetrics & Gynecology

## 2020-09-27 ENCOUNTER — Other Ambulatory Visit: Payer: Self-pay

## 2020-09-27 DIAGNOSIS — N939 Abnormal uterine and vaginal bleeding, unspecified: Secondary | ICD-10-CM | POA: Diagnosis present

## 2020-09-27 DIAGNOSIS — D259 Leiomyoma of uterus, unspecified: Secondary | ICD-10-CM | POA: Insufficient documentation

## 2020-09-27 DIAGNOSIS — Z79899 Other long term (current) drug therapy: Secondary | ICD-10-CM | POA: Insufficient documentation

## 2020-09-27 DIAGNOSIS — Z87891 Personal history of nicotine dependence: Secondary | ICD-10-CM | POA: Diagnosis not present

## 2020-09-27 DIAGNOSIS — Z9884 Bariatric surgery status: Secondary | ICD-10-CM | POA: Diagnosis not present

## 2020-09-27 DIAGNOSIS — N800 Endometriosis of uterus: Secondary | ICD-10-CM | POA: Insufficient documentation

## 2020-09-27 DIAGNOSIS — I10 Essential (primary) hypertension: Secondary | ICD-10-CM | POA: Insufficient documentation

## 2020-09-27 DIAGNOSIS — E669 Obesity, unspecified: Secondary | ICD-10-CM | POA: Insufficient documentation

## 2020-09-27 DIAGNOSIS — Z6832 Body mass index (BMI) 32.0-32.9, adult: Secondary | ICD-10-CM | POA: Insufficient documentation

## 2020-09-27 HISTORY — PX: HYSTERECTOMY ABDOMINAL WITH SALPINGECTOMY: SHX6725

## 2020-09-27 LAB — GLUCOSE, CAPILLARY: Glucose-Capillary: 77 mg/dL (ref 70–99)

## 2020-09-27 SURGERY — HYSTERECTOMY, TOTAL, ABDOMINAL, WITH SALPINGECTOMY
Anesthesia: General | Site: Abdomen | Laterality: Bilateral

## 2020-09-27 MED ORDER — POVIDONE-IODINE 10 % EX SWAB: 2.0000 "application " | Freq: Once | CUTANEOUS | Status: DC

## 2020-09-27 MED ORDER — ROCURONIUM 10MG/ML (10ML) SYRINGE FOR MEDFUSION PUMP - OPTIME
INTRAVENOUS | Status: DC | PRN
  Administered 2020-09-27: 15 mg via INTRAVENOUS
  Administered 2020-09-27: 45 mg via INTRAVENOUS

## 2020-09-27 MED ORDER — HYDROMORPHONE HCL 1 MG/ML IJ SOLN
0.2500 mg | INTRAMUSCULAR | Status: DC | PRN
  Administered 2020-09-27 (×2): 0.5 mg via INTRAVENOUS
  Filled 2020-09-27 (×2): qty 0.5

## 2020-09-27 MED ORDER — HEMOSTATIC AGENTS (NO CHARGE) OPTIME
TOPICAL | Status: DC | PRN
  Administered 2020-09-27: 1 via TOPICAL

## 2020-09-27 MED ORDER — PROPOFOL 10 MG/ML IV BOLUS
INTRAVENOUS | Status: DC | PRN
  Administered 2020-09-27: 175 mg via INTRAVENOUS

## 2020-09-27 MED ORDER — LIDOCAINE HCL (CARDIAC) PF 50 MG/5ML IV SOSY
PREFILLED_SYRINGE | INTRAVENOUS | Status: DC | PRN
  Administered 2020-09-27: 75 mg via INTRAVENOUS

## 2020-09-27 MED ORDER — MIDAZOLAM HCL 5 MG/5ML IJ SOLN
INTRAMUSCULAR | Status: DC | PRN
  Administered 2020-09-27: 2 mg via INTRAVENOUS

## 2020-09-27 MED ORDER — ONDANSETRON HCL 4 MG/2ML IJ SOLN
INTRAMUSCULAR | Status: AC
  Filled 2020-09-27: qty 2

## 2020-09-27 MED ORDER — KETOROLAC TROMETHAMINE 30 MG/ML IJ SOLN
INTRAMUSCULAR | Status: AC
  Filled 2020-09-27: qty 1

## 2020-09-27 MED ORDER — ORAL CARE MOUTH RINSE: 15.0000 mL | Freq: Once | OROMUCOSAL | Status: AC

## 2020-09-27 MED ORDER — SUGAMMADEX SODIUM 200 MG/2ML IV SOLN
INTRAVENOUS | Status: DC | PRN
  Administered 2020-09-27: 200 mg via INTRAVENOUS

## 2020-09-27 MED ORDER — LACTATED RINGERS IV SOLN
INTRAVENOUS | Status: DC
  Administered 2020-09-27: 1000 mL via INTRAVENOUS

## 2020-09-27 MED ORDER — LACTATED RINGERS IV SOLN: INTRAVENOUS | Status: DC

## 2020-09-27 MED ORDER — DEXAMETHASONE SODIUM PHOSPHATE 10 MG/ML IJ SOLN
INTRAMUSCULAR | Status: DC | PRN
  Administered 2020-09-27: 8 mg via INTRAVENOUS

## 2020-09-27 MED ORDER — FENTANYL CITRATE (PF) 250 MCG/5ML IJ SOLN
INTRAMUSCULAR | Status: AC
  Filled 2020-09-27: qty 5

## 2020-09-27 MED ORDER — FENTANYL CITRATE (PF) 100 MCG/2ML IJ SOLN
INTRAMUSCULAR | Status: DC | PRN
  Administered 2020-09-27 (×7): 50 ug via INTRAVENOUS
  Administered 2020-09-27: 100 ug via INTRAVENOUS
  Administered 2020-09-27: 50 ug via INTRAVENOUS

## 2020-09-27 MED ORDER — DEXAMETHASONE SODIUM PHOSPHATE 10 MG/ML IJ SOLN
INTRAMUSCULAR | Status: AC
  Filled 2020-09-27: qty 1

## 2020-09-27 MED ORDER — METOCLOPRAMIDE HCL 5 MG/ML IJ SOLN
10.0000 mg | Freq: Once | INTRAMUSCULAR | Status: AC
  Administered 2020-09-27: 10 mg via INTRAVENOUS
  Filled 2020-09-27: qty 2

## 2020-09-27 MED ORDER — CEFAZOLIN SODIUM-DEXTROSE 2-4 GM/100ML-% IV SOLN
2.0000 g | INTRAVENOUS | Status: AC
  Administered 2020-09-27: 2 g via INTRAVENOUS
  Filled 2020-09-27: qty 100

## 2020-09-27 MED ORDER — BUPIVACAINE-MELOXICAM ER 200-6 MG/7ML IJ SOLN
INTRAMUSCULAR | Status: AC
  Filled 2020-09-27: qty 2

## 2020-09-27 MED ORDER — KETOROLAC TROMETHAMINE 15 MG/ML IJ SOLN
INTRAMUSCULAR | Status: DC | PRN
  Administered 2020-09-27: 15 mg via INTRAVENOUS

## 2020-09-27 MED ORDER — LIDOCAINE HCL (PF) 2 % IJ SOLN
INTRAMUSCULAR | Status: AC
  Filled 2020-09-27: qty 10

## 2020-09-27 MED ORDER — ACETAMINOPHEN 500 MG PO TABS
1000.0000 mg | ORAL_TABLET | ORAL | Status: AC
  Administered 2020-09-27: 1000 mg via ORAL
  Filled 2020-09-27: qty 2

## 2020-09-27 MED ORDER — CHLORHEXIDINE GLUCONATE 0.12 % MT SOLN
15.0000 mL | Freq: Once | OROMUCOSAL | Status: AC
  Administered 2020-09-27: 15 mL via OROMUCOSAL

## 2020-09-27 MED ORDER — ROCURONIUM BROMIDE 10 MG/ML (PF) SYRINGE
PREFILLED_SYRINGE | INTRAVENOUS | Status: AC
  Filled 2020-09-27: qty 10

## 2020-09-27 MED ORDER — ONDANSETRON HCL 4 MG/2ML IJ SOLN: 4.0000 mg | Freq: Once | INTRAMUSCULAR | Status: DC | PRN

## 2020-09-27 MED ORDER — ONDANSETRON HCL 4 MG/2ML IJ SOLN
INTRAMUSCULAR | Status: DC | PRN
  Administered 2020-09-27: 4 mg via INTRAVENOUS

## 2020-09-27 MED ORDER — 0.9 % SODIUM CHLORIDE (POUR BTL) OPTIME
TOPICAL | Status: DC | PRN
  Administered 2020-09-27 (×2): 1000 mL

## 2020-09-27 MED ORDER — MIDAZOLAM HCL 2 MG/2ML IJ SOLN
INTRAMUSCULAR | Status: AC
  Filled 2020-09-27: qty 2

## 2020-09-27 MED ORDER — PROPOFOL 10 MG/ML IV BOLUS
INTRAVENOUS | Status: AC
  Filled 2020-09-27: qty 20

## 2020-09-27 MED ORDER — BUPIVACAINE-MELOXICAM ER 200-6 MG/7ML IJ SOLN
INTRAMUSCULAR | Status: DC | PRN
  Administered 2020-09-27: 400 mg

## 2020-09-27 MED ORDER — MEPERIDINE HCL 50 MG/ML IJ SOLN: 6.2500 mg | INTRAMUSCULAR | Status: DC | PRN

## 2020-09-27 MED ORDER — FENTANYL CITRATE PF 50 MCG/ML IJ SOSY
50.0000 ug | PREFILLED_SYRINGE | INTRAMUSCULAR | Status: DC
  Administered 2020-09-27: 100 ug via INTRAVENOUS
  Filled 2020-09-27: qty 2

## 2020-09-27 SURGICAL SUPPLY — 40 items
ADH SKN CLS APL DERMABOND .7 (GAUZE/BANDAGES/DRESSINGS) ×1
APL SKNCLS STERI-STRIP NONHPOA (GAUZE/BANDAGES/DRESSINGS) ×1
APL SWBSTK 6 STRL LF DISP (MISCELLANEOUS) ×1
APPLICATOR COTTON TIP 6 STRL (MISCELLANEOUS) ×1 IMPLANT
APPLICATOR COTTON TIP 6IN STRL (MISCELLANEOUS) ×2
BENZOIN TINCTURE PRP APPL 2/3 (GAUZE/BANDAGES/DRESSINGS) ×2 IMPLANT
DERMABOND ADVANCED (GAUZE/BANDAGES/DRESSINGS) ×1
DERMABOND ADVANCED .7 DNX12 (GAUZE/BANDAGES/DRESSINGS) IMPLANT
DRAPE WARM FLUID 44X44 (DRAPES) ×2 IMPLANT
DRSG OPSITE POSTOP 4X10 (GAUZE/BANDAGES/DRESSINGS) ×2 IMPLANT
DRSG OPSITE POSTOP 4X8 (GAUZE/BANDAGES/DRESSINGS) ×1 IMPLANT
DURAPREP 26ML APPLICATOR (WOUND CARE) ×2 IMPLANT
GAUZE 4X4 16PLY ~~LOC~~+RFID DBL (SPONGE) ×4 IMPLANT
GLOVE BIOGEL M 6.5 STRL (GLOVE) ×4 IMPLANT
GLOVE SURG LTX SZ6.5 (GLOVE) ×2 IMPLANT
GLOVE SURG UNDER POLY LF SZ7 (GLOVE) ×6 IMPLANT
GOWN STRL REUS W/ TWL LRG LVL3 (GOWN DISPOSABLE) ×3 IMPLANT
GOWN STRL REUS W/TWL LRG LVL3 (GOWN DISPOSABLE) ×6
HEMOSTAT ARISTA ABSORB 3G PWDR (HEMOSTASIS) ×2 IMPLANT
KIT BLADEGUARD II DBL (SET/KITS/TRAYS/PACK) ×1 IMPLANT
KIT TURNOVER KIT A (KITS) ×2 IMPLANT
NS IRRIG 1000ML POUR BTL (IV SOLUTION) ×3 IMPLANT
PACK ABDOMINAL MAJOR (CUSTOM PROCEDURE TRAY) ×2 IMPLANT
PAD ARMBOARD 7.5X6 YLW CONV (MISCELLANEOUS) ×2 IMPLANT
PENCIL HANDSWITCHING (ELECTRODE) ×2 IMPLANT
PENCIL SMOKE EVACUATOR (MISCELLANEOUS) ×2 IMPLANT
SET BASIN LINEN APH (SET/KITS/TRAYS/PACK) ×4 IMPLANT
SPONGE T-LAP 18X18 ~~LOC~~+RFID (SPONGE) ×4 IMPLANT
SUT CHROMIC 0 CT 1 (SUTURE) ×2 IMPLANT
SUT PLAIN 2 0 XLH (SUTURE) ×1 IMPLANT
SUT VIC AB 0 CT1 27 (SUTURE) ×6
SUT VIC AB 0 CT1 27XCR 8 STRN (SUTURE) IMPLANT
SUT VIC AB 0 CTX 36 (SUTURE) ×2
SUT VIC AB 0 CTX36XBRD ANTBCTR (SUTURE) ×1 IMPLANT
SUT VICRYL 0 TIES 12 18 (SUTURE) ×2 IMPLANT
SUT VICRYL 3 0 (SUTURE) ×1 IMPLANT
TOWEL GREEN STERILE FF (TOWEL DISPOSABLE) ×4 IMPLANT
TOWEL ~~LOC~~+RFID 17X26 BLUE (SPONGE) ×2 IMPLANT
TRAY FOLEY W/BAG SLVR 16FR (SET/KITS/TRAYS/PACK) ×2
TRAY FOLEY W/BAG SLVR 16FR ST (SET/KITS/TRAYS/PACK) ×1 IMPLANT

## 2020-09-27 NOTE — Op Note (Addendum)
Preoperative diagnosis:  1.  Abnormal uterine bleeding                                        Postoperative diagnosis:  Same as above   Procedure:  Abdominal hysterectomy and bilateral salpingectomy  Surgeon:  Dr. Janyth Pupa  Assistant:  Dr. Darlis Loan  Anesthesia:  General endotracheal  IVF: 1500cc EBL: 200cc UOP: 150cc   Intraoperative findings: Normal uterus, tubes and ovaries bilaterally.  Description of operation:  Patient was taken to the operating room and placed in the supine position where she underwent general endotracheal anesthesia.  She was then prepped and draped in the usual sterile fashion and a Foley catheter was placed for continuous bladder drainage.  A 6cm, Pfannenstiel skin incision was made and carried down sharply to the rectus fascia which was scored in the midline and extended laterally.  The fascia was taken off the muscles superiorly and inferiorly without difficulty.  The muscles were divided.  The peritoneal cavity was entered.  A small Alexis self-retaining retractor was placed.  The upper abdomen was packed away. Both uterine cornu were grasped with Coker clamps.  The left round ligament was suture ligated and coagulated with the electrocautery unit.  The left vesicouterine serosal flap was created.  An avascular window in in the peritoneum was created and the utero-ovarian ligament was cross clamped, cut and suture ligated.  The right round ligament was suture ligated and cut with the electrocautery unit.  The vesicouterine serosal flap on the right was created.  An avascular window in the peritoneum was created and the right utero-ovarian ligament was cross clamped, cut and double suture ligated.  Thus both ovaries were preserved.  The uterine vessels were skeletonized bilaterally.  The uterine vessels were clamped bilaterally,  then cut and suture ligated.  Two more pedicles were taken down the cervix medial to the uterine vessels.  Each pedicle was clamped cut  and suture ligated with good resulting hemostasis. The cardinal ligaments and uterosacral ligaments were clamped, cut and suture ligated.  Curved heany clamps were placed at the angles of the vagina, the uterus and cervix were cut, removed and suture ligated. The vaginal cuff was reinforced with figure of eight stitches. Hemostasis was confirmed.   The pelvis was irrigated and all pedicles were examined and found to be hemostatic.  Both Fallpoian tubes were removed by cross clamping with a Kelley clamp and ligated with 0-vicryl.  All specimens were sent to pathology for routine evaluation.  The Alexis self-retaining retractor was removed and the pelvis was irrigated vigorously.  All packs were removed and all counts were correct at this point x 3.  The muscles and peritoneum were reapproximated loosely.  The fascia was closed with 0 Vicryl running.  The subcutaneous tissue was reapproximated using 2-0 plain gut.  The skin was closed using 3-0 Vicryl on a Keith needle in a subcuticular fashion.  Dermabond was then applied for additional wound integrity and to serve as a postoperative bacterial barrier.  The patient was awakened from anesthesia taken to the recovery room in good stable condition. All sponge instrument and needle counts were correct.  Experienced assistants were required to be given the standard of surgical care given the omplexity of the case.  The assistants were needed for exposure, dissection, suctioning, retraction, instrument exchange, and for overall help during the procedure.  Annalee Genta, DO  09/27/2020 11:49 AM

## 2020-09-27 NOTE — Anesthesia Procedure Notes (Signed)
Procedure Name: Intubation Date/Time: 09/27/2020 10:10 AM Performed by: Ollen Bowl, CRNA Pre-anesthesia Checklist: Patient identified, Patient being monitored, Timeout performed, Emergency Drugs available and Suction available Patient Re-evaluated:Patient Re-evaluated prior to induction Oxygen Delivery Method: Circle system utilized Preoxygenation: Pre-oxygenation with 100% oxygen Induction Type: IV induction Ventilation: Mask ventilation without difficulty Laryngoscope Size: Mac and 3 Grade View: Grade I Tube type: Oral Tube size: 7.0 mm Number of attempts: 1 Airway Equipment and Method: Stylet Placement Confirmation: ETT inserted through vocal cords under direct vision, positive ETCO2 and breath sounds checked- equal and bilateral Secured at: 21 cm Tube secured with: Tape Dental Injury: Teeth and Oropharynx as per pre-operative assessment

## 2020-09-27 NOTE — Anesthesia Postprocedure Evaluation (Signed)
Anesthesia Post Note  Patient: Brooke [name]  Procedure(s) Performed: MINI LAP HYSTERECTOMY ABDOMINAL WITH BILATERAL SALPINGECTOMY (Bilateral: Abdomen)  Patient location during evaluation: PACU Anesthesia Type: General Level of consciousness: awake and alert and oriented Pain management: pain level controlled Vital Signs Assessment: post-procedure vital signs reviewed and stable Respiratory status: spontaneous breathing and respiratory function stable Cardiovascular status: blood pressure returned to baseline and stable Postop Assessment: no apparent nausea or vomiting Anesthetic complications: no   No notable events documented.   Last Vitals:  Vitals:   09/27/20 1312 09/27/20 1327  BP: 139/84 (!) 142/96  Pulse: 60 72  Resp: 17 18  Temp:  36.6 C  SpO2: 100% 100%    Last Pain:  Vitals:   09/27/20 1327  TempSrc: Oral  PainSc: 7                  [name] C Sherby Moncayo

## 2020-09-27 NOTE — Transfer of Care (Signed)
Immediate Anesthesia Transfer of Care Note  Patient: Brooke [name]  Procedure(s) Performed: MINI LAP HYSTERECTOMY ABDOMINAL WITH BILATERAL SALPINGECTOMY (Bilateral: Abdomen)  Patient Location: PACU  Anesthesia Type:General  Level of Consciousness: awake  Airway & Oxygen Therapy: Patient Spontanous Breathing  Post-op Assessment: Report given to RN  Post vital signs: Reviewed and stable  Last Vitals:  Vitals Value Taken Time  BP 152/66 09/27/20 1145  Temp    Pulse 67 09/27/20 1147  Resp 15 09/27/20 1147  SpO2 100 % 09/27/20 1147  Vitals shown include unvalidated device data.  Last Pain:  Vitals:   09/27/20 0840  TempSrc: Oral  PainSc: 0-No pain      Patients Stated Pain Goal: 7 (80/16/55 3748)  Complications: No notable events documented.

## 2020-09-27 NOTE — Discharge Instructions (Addendum)
HOME INSTRUCTIONS  Please note any unusual or excessive bleeding, pain, swelling. Mild dizziness or drowsiness are normal for about 24 hours after surgery.   Shower when comfortable  Restrictions: No driving for 24 hours or while taking pain medications.  Activity:  No heavy lifting (> 10 lbs), nothing in vagina (no tampons, douching, or intercourse) x 4 weeks; no tub baths for 4 weeks/  Vaginal spotting is expected but if your bleeding is heavy, period like,  please call the office   Incision: The honeycomb dressing can be removed on Friday.  When you get in the shower- get the bandage soaking wet and then remove.  Once out of the shower, pat the incision dry.  Be sure to keep the incision clean and dry.  You may clean your incision with mild soap and water but do not rub or scrub the incision site.  You may experience slight bloody drainage from your incision periodically.  This is normal.  If you experience a large amount of drainage or the incision opens, please call your physician who will likely direct you to the emergency department.  Diet:  You may return to your regular diet.  Do not eat large meals.  Eat small frequent meals throughout the day.  Continue to drink a good amount of water at least 6-8 glasses of water per day, hydration is very important for the healing process.  Pain Management: For pain management alternate between ibuprofen and tylenol.  For moderate to severe pain, take the oxycodone along with the tylenol.  For the first few days, take pain medication regularly (every 6-8hrs) to help control your discomfort.  Always take prescription pain medication with food.  While taking the oxycodone, be sure to take colace (stool softener) up to 2x daily as needed.  Oxycodone may cause constipation.  Alcohol -- Avoid for 24 hours and while taking pain medications.  Nausea: Take sips of ginger ale or soda  Fever -- Call physician if temperature over 101 degrees  Follow up:  If  you do not already have a follow up appointment scheduled, please call the office to schedule.  If you experience fever (a temperature greater than 100.4), pain unrelieved by pain medication, shortness of breath, swelling of a single leg, or any other symptoms which are concerning to you please the office immediately.

## 2020-09-27 NOTE — Anesthesia Preprocedure Evaluation (Addendum)
Anesthesia Evaluation  Patient identified by MRN, date of birth, ID band Patient awake    Reviewed: Allergy & Precautions, NPO status , Patient's Chart, lab work & pertinent test results  Airway Mallampati: II  TM Distance: >3 FB Neck ROM: Full   Comment: Cervical pain (neck) Dental  (+) Dental Advisory Given, Teeth Intact   Pulmonary former smoker,    Pulmonary exam normal breath sounds clear to auscultation       Cardiovascular Exercise Tolerance: Good hypertension (past h/o HTN, not on meds now), Normal cardiovascular exam Rhythm:Regular Rate:Normal     Neuro/Psych  Headaches, Seizures - (last seizure - 2 months ago), Well Controlled,  PSYCHIATRIC DISORDERS Anxiety Depression  Neuromuscular disease    GI/Hepatic hiatal hernia, GERD  Medicated,(+)     substance abuse  alcohol use, Gastric bypass   Endo/Other  negative endocrine ROS  Renal/GU negative Renal ROS     Musculoskeletal  (+) Arthritis , Osteoarthritis,    Abdominal   Peds  Hematology  (+) anemia ,   Anesthesia Other Findings Cervical pain (neck)  Reproductive/Obstetrics                          Anesthesia Physical Anesthesia Plan  ASA: 2  Anesthesia Plan: General   Post-op Pain Management:    Induction: Intravenous  PONV Risk Score and Plan: 4 or greater and Ondansetron, Dexamethasone and Midazolam  Airway Management Planned: Oral ETT  Additional Equipment:   Intra-op Plan:   Post-operative Plan: Extubation in OR  Informed Consent: I have reviewed the patients History and Physical, chart, labs and discussed the procedure including the risks, benefits and alternatives for the proposed anesthesia with the patient or authorized representative who has indicated his/her understanding and acceptance.     Dental advisory given  Plan Discussed with: CRNA and Surgeon  Anesthesia Plan Comments:          Anesthesia Quick Evaluation

## 2020-09-27 NOTE — Interval H&P Note (Signed)
History and Physical Interval Note:  09/27/2020 9:43 AM  Brooke [name]  has presented today for surgery, with the diagnosis of Abnormal Uterine Bleeding.  The various methods of treatment have been discussed with the patient and family. After consideration of risks, benefits and other options for treatment, the patient has consented to  Procedure(s): MINI LAP HYSTERECTOMY ABDOMINAL WITH SALPINGECTOMY (Bilateral) as a surgical intervention.  The patient's history has been reviewed, patient examined, no change in status, stable for surgery.  I have reviewed the patient's chart and labs.  Questions were answered to the patient's satisfaction.     Annalee Genta

## 2020-09-28 ENCOUNTER — Telehealth: Payer: Self-pay | Admitting: Obstetrics & Gynecology

## 2020-09-28 ENCOUNTER — Encounter (HOSPITAL_COMMUNITY): Payer: Self-pay | Admitting: Obstetrics & Gynecology

## 2020-09-28 LAB — SURGICAL PATHOLOGY

## 2020-09-28 NOTE — Telephone Encounter (Signed)
Oxycodone had been sent in on prior visit.  Called pt earlier this afternoon- encouraged heating pack.  May also take 2 oxycodone if needed.  Encouraged scheduled tylenol then oxy every 4 hours.  If no improvement or worsening, let us know.  Will plan to f/u later this week

## 2020-09-28 NOTE — Telephone Encounter (Signed)
Patient stated that she is having to take pain medicine Ozan prescribed after surgery and two tylenol for pain. She wanted to know if it could be switched to something else where she wouldn't be having to take so much medicine.

## 2020-09-30 ENCOUNTER — Telehealth: Payer: Self-pay | Admitting: Obstetrics & Gynecology

## 2020-09-30 ENCOUNTER — Other Ambulatory Visit: Payer: Self-pay | Admitting: Obstetrics & Gynecology

## 2020-09-30 MED ORDER — OXYCODONE-ACETAMINOPHEN 5-325 MG PO TABS: 1.0000 | ORAL_TABLET | ORAL | 0 refills | Status: DC | PRN

## 2020-09-30 NOTE — Telephone Encounter (Signed)
Patient called stating that she has surgery on Tuesday with Dr. Nelda Marseille and she needs a refill of her pain medication, patient states she uses [facility] on [address]. Please contact pt when medication has been called in

## 2020-10-04 ENCOUNTER — Other Ambulatory Visit: Payer: Self-pay | Admitting: Internal Medicine

## 2020-10-05 ENCOUNTER — Ambulatory Visit (INDEPENDENT_AMBULATORY_CARE_PROVIDER_SITE_OTHER): Payer: 59 | Admitting: Family Medicine

## 2020-10-05 ENCOUNTER — Encounter (HOSPITAL_COMMUNITY): Payer: Self-pay | Admitting: Obstetrics & Gynecology

## 2020-10-05 ENCOUNTER — Telehealth: Payer: Self-pay | Admitting: Obstetrics & Gynecology

## 2020-10-05 MED ORDER — HEMOSTATIC AGENTS (NO CHARGE) OPTIME
TOPICAL | Status: AC | PRN
  Administered 2020-09-27: 1 via TOPICAL

## 2020-10-05 NOTE — Telephone Encounter (Signed)
Patient calling stating that she had surgery on the 09/27/20 and wants to know when she can drive
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
## 2020-10-13 DIAGNOSIS — M5412 Radiculopathy, cervical region: Secondary | ICD-10-CM | POA: Insufficient documentation

## 2020-10-14 ENCOUNTER — Encounter: Payer: Self-pay | Admitting: Cardiovascular Disease

## 2020-10-14 ENCOUNTER — Other Ambulatory Visit: Payer: Self-pay

## 2020-10-14 ENCOUNTER — Ambulatory Visit (INDEPENDENT_AMBULATORY_CARE_PROVIDER_SITE_OTHER): Payer: 59 | Admitting: Cardiovascular Disease

## 2020-10-14 VITALS — BP 108/72 | HR 94 | Ht 62.0 in | Wt 169.0 lb

## 2020-10-14 DIAGNOSIS — R42 Dizziness and giddiness: Secondary | ICD-10-CM
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
## 2020-10-14 NOTE — Patient Instructions (Signed)
Medication Instructions:  Your physician recommends that you continue on your current medications as directed. Please refer to the Current Medication list given to you today.  *If you need a refill on your cardiac medications before your next appointment, please call your pharmacy*   Lab Work: None today  If you have labs (blood work) drawn today and your tests are completely normal, you will receive your results only by: [place] (if you have MyChart) OR A paper copy in the mail If you have any lab test that is abnormal or we need to change your treatment, we will call you to review the results.   Testing/Procedures: None today    Follow-Up: At [facility], you and your health needs are our priority.  As part of our continuing mission to provide you with exceptional heart care, we have created designated Provider Care Teams.  These Care Teams include your primary Cardiologist (physician) and Advanced Practice Providers (APPs -  Physician Assistants and Nurse Practitioners) who all work together to provide you with the care you need, when you need it.  We recommend signing up for the patient portal called "MyChart".  Sign up information is provided on this After Visit Summary.  MyChart is used to connect with patients for Virtual Visits (Telemedicine).  Patients are able to view lab/test results, encounter notes, upcoming appointments, etc.  Non-urgent messages can be sent to your provider as well.   To learn more about what you can do with MyChart, go to [email or web address].    Your next appointment:  As needed.   Other Instructions  Please stay hydrated. Drink 60 ounces of water daily         Thank you for choosing [facility] !

## 2020-10-17 ENCOUNTER — Other Ambulatory Visit: Payer: Self-pay | Admitting: Student

## 2020-10-17 ENCOUNTER — Other Ambulatory Visit (INDEPENDENT_AMBULATORY_CARE_PROVIDER_SITE_OTHER): Payer: Self-pay | Admitting: Adult Health

## 2020-10-17 ENCOUNTER — Ambulatory Visit (INDEPENDENT_AMBULATORY_CARE_PROVIDER_SITE_OTHER): Payer: 59 | Admitting: Obstetrics & Gynecology

## 2020-10-17 ENCOUNTER — Encounter: Payer: Self-pay | Admitting: Obstetrics & Gynecology

## 2020-10-17 ENCOUNTER — Other Ambulatory Visit: Payer: Self-pay

## 2020-10-17 ENCOUNTER — Telehealth (INDEPENDENT_AMBULATORY_CARE_PROVIDER_SITE_OTHER): Payer: 59 | Admitting: Psychiatry

## 2020-10-17 ENCOUNTER — Encounter: Payer: Self-pay | Admitting: Psychiatry

## 2020-10-17 VITALS — BP 112/75 | HR 78 | Ht 62.0 in | Wt 170.6 lb

## 2020-10-17 DIAGNOSIS — F410 Panic disorder [episodic paroxysmal anxiety] without agoraphobia: Secondary | ICD-10-CM | POA: Diagnosis not present

## 2020-10-17 DIAGNOSIS — E8881 Metabolic syndrome: Secondary | ICD-10-CM

## 2020-10-17 DIAGNOSIS — F331 Major depressive disorder, recurrent, moderate: Secondary | ICD-10-CM

## 2020-10-17 DIAGNOSIS — M5412 Radiculopathy, cervical region: Secondary | ICD-10-CM

## 2020-10-17 DIAGNOSIS — R300 Dysuria: Secondary | ICD-10-CM | POA: Diagnosis not present

## 2020-10-17 DIAGNOSIS — Z4889 Encounter for other specified surgical aftercare: Secondary | ICD-10-CM

## 2020-10-17 LAB — POCT URINALYSIS DIPSTICK OB
Blood, UA: NEGATIVE
Glucose, UA: NEGATIVE
Ketones, UA: NEGATIVE
Leukocytes, UA: NEGATIVE
Nitrite, UA: NEGATIVE
POC,PROTEIN,UA: NEGATIVE

## 2020-10-17 MED ORDER — REXULTI 0.5 MG PO TABS: 0.5000 mg | ORAL_TABLET | Freq: Every day | ORAL | 0 refills | Status: DC

## 2020-10-17 MED ORDER — VENLAFAXINE HCL ER 75 MG PO CP24: 75.0000 mg | ORAL_CAPSULE | Freq: Every day | ORAL | 1 refills | Status: DC

## 2020-10-17 NOTE — Progress Notes (Signed)
   Postoperative visit  Brooke [name] is a 50 y.o. [phone] female who presents for a postoperative visit- s/p TAH, BS completed on 09/27/20  Today she notes that she on occasion has some discomfort when she voids.  Denies pelvic or abdominal pain. Denies fever or chills.  Tolerating gen diet.  +Flatus, Regular BMs.  Overall doing well and reports no acute complaints   Review of Systems Pertinent items are noted in HPI.    Objective:  BP 112/75 (BP Location: Right Arm, Patient Position: Sitting, Cuff Size: Normal)   Pulse 78   Ht 5\' 2"  (1.575 m)   Wt 170 lb 9.6 oz (77.4 kg)   LMP 09/01/2020 (Approximate)   BMI 31.20 kg/m    Physical Examination:  GENERAL ASSESSMENT: well developed and well nourished SKIN: normal color, no lesions CHEST: normal air exchange, respiratory effort normal with no retractions HEART: regular rate and rhythm ABDOMEN: soft, non-distended, no rebound, no guading INCISION: C/D/I- well healed PELVIC: normal external genitalia, pink moist mucosa, no abnormal discharge, vaginal cuff intact- healing appropriately EXTREMITY: no edema, no calf tenderness PSYCH: mood appropriate, normal affect       Assessment:    Postop visit   Plan:   -meeting postop milestones appropriately -advised another week of pelvic rest  -f/u for yearly or as clinically indicated -Dysuria- will r/o underlying infection  Janyth Pupa, DO Attending [place], [place] for [employer], [place]

## 2020-10-17 NOTE — Telephone Encounter (Signed)
LAST APPOINTMENT DATE: 09/14/20 NEXT APPOINTMENT DATE:10/26/20   [id number] - [place], [place] - [address] [place] [address] Phone: [phone] Fax: [phone]  Patient is requesting a refill of the following medications: Pending Prescriptions:                       Disp   Refills   TRULICITY 3 QV/7.9KC SOPN [Pharmacy Med Na*2 mL   0       Sig: INJECT 3 MG INTO THE SKIN ONCE A WEEK ON SUNDAYS   Date last filled: 09/26/20 Previously prescribed by Katy  Lab Results      Component                Value               Date                      HGBA1C                   5.3                 09/22/2020                HGBA1C                   5.2                 08/26/2020                HGBA1C                   5.1                 03/24/2020           Lab Results      Component                Value               Date                      LDLCALC                  117 (H)             03/24/2020                CREATININE               0.77                09/22/2020           Lab Results      Component                Value               Date                      VD25OH                   80.1                09 /14/2022                VD25OH  56.4                03/24/2020                VD25OH                   41.8                09/14/2019            BP Readings from Last 3 Encounters: 10/14/20 : 108/72 09/27/20 : (!) 142/96 09/22/20 : 120/80

## 2020-10-17 NOTE — Patient Instructions (Signed)
1. Continue venlafaxine 225 mg at night   2. Continue Rexulti 0.5 mg at night  3 Next appointment:  1/12 at 11 AM, video

## 2020-10-17 NOTE — Telephone Encounter (Signed)
Last seen by Katy 

## 2020-10-20 LAB — URINE CULTURE

## 2020-10-26 ENCOUNTER — Ambulatory Visit (INDEPENDENT_AMBULATORY_CARE_PROVIDER_SITE_OTHER): Payer: 59 | Admitting: Family Medicine

## 2020-10-26 ENCOUNTER — Other Ambulatory Visit: Payer: Self-pay

## 2020-10-26 ENCOUNTER — Encounter (INDEPENDENT_AMBULATORY_CARE_PROVIDER_SITE_OTHER): Payer: Self-pay | Admitting: Family Medicine

## 2020-10-26 VITALS — BP 94/66 | HR 69 | Temp 98.0°F | Ht 62.0 in | Wt 163.0 lb

## 2020-10-26 DIAGNOSIS — Z6839 Body mass index (BMI) 39.0-39.9, adult: Secondary | ICD-10-CM | POA: Diagnosis not present

## 2020-10-26 DIAGNOSIS — F329 Major depressive disorder, single episode, unspecified: Secondary | ICD-10-CM | POA: Diagnosis not present

## 2020-10-26 DIAGNOSIS — E1165 Type 2 diabetes mellitus with hyperglycemia: Secondary | ICD-10-CM | POA: Diagnosis not present

## 2020-10-26 NOTE — Progress Notes (Signed)
Chief Complaint:   OBESITY Brooke [name] is here to discuss her progress with her obesity treatment plan along with follow-up of her obesity related diagnoses. Brooke [name] is on the [employer] and states she is following her eating plan approximately 100% of the time. Brooke [name] states she is not currently exercising.  Today's visit was #: 86 Starting weight: 217 lbs Starting date: 09/14/2019 Today's weight: 163 lbs Today's date: 10/26/2020 Total lbs lost to date: 82 Total lbs lost since last in-office visit: 8  Interim History: Brooke [name] had a hysterectomy 09/27/2020. She is feeling ready to get back to normal life. She is following Pescatarian meal plan even post op (that was the only food she had in the house). She is going to [place] to see her son for [place]. Pt saw cardiology for dizziness 10/14.  Subjective:   1. Type 2 diabetes mellitus with hyperglycemia, without long-term current use of insulin (HCC) Chonte is on Trulicity 3 mg and doing well with no side effects. Her last A1c was 5.3.  2. Major depressive disorder with current active episode, unspecified depression episode severity, unspecified whether recurrent Pt is on Rexulti and Effexor.  Assessment/Plan:   1. Type 2 diabetes mellitus with hyperglycemia, without long-term current use of insulin (HCC) Good blood sugar control is important to decrease the likelihood of diabetic complications such as nephropathy, neuropathy, limb loss, blindness, coronary artery disease, and death. Intensive lifestyle modification including diet, exercise and weight loss are the first line of treatment for diabetes. Continue Trulicity.  2. Major depressive disorder with current active episode, unspecified depression episode severity, unspecified whether recurrent Follow up with [name].  3. Obesity with current BMI of 29.8  Brooke [name] is currently in the action stage of change. As such, her goal is to continue with weight loss efforts. She has  agreed to the [employer].   Exercise goals: All adults should avoid inactivity. Some physical activity is better than none, and adults who participate in any amount of physical activity gain some health benefits. Start walking.  Behavioral modification strategies: increasing lean protein intake, meal planning and cooking strategies, keeping healthy foods in the home, and planning for success.  Brooke [name] has agreed to follow-up with our clinic in 3 weeks. She was informed of the importance of frequent follow-up visits to maximize her success with intensive lifestyle modifications for her multiple health conditions.   Objective:   Blood pressure 94/66, pulse 69, temperature 98 F (36.7 C), height 5\' 2"  (1.575 m), weight 163 lb (73.9 kg), last menstrual period 09/01/2020, SpO2 100 %. Body mass index is 29.81 kg/m.  General: Cooperative, alert, well developed, in no acute distress. HEENT: Conjunctivae and lids unremarkable. Cardiovascular: Regular rhythm.  Lungs: Normal work of breathing. Neurologic: No focal deficits.   Lab Results  Component Value Date   CREATININE 0.77 09/22/2020   BUN 9 09/22/2020   NA 137 09/22/2020   K 3.7 09/22/2020   CL 111 09/22/2020   CO2 21 (L) 09/22/2020   Lab Results  Component Value Date   ALT 21 08/26/2020   AST 20 08/26/2020   ALKPHOS 68 08/26/2020   BILITOT 0.4 08/26/2020   Lab Results  Component Value Date   HGBA1C 5.3 09/22/2020   HGBA1C 5.2 08/26/2020   HGBA1C 5.1 03/24/2020   HGBA1C 5.2 09/14/2019   HGBA1C 5.3 08/05/2019   Lab Results  Component Value Date   INSULIN 5.0 09/14/2020   INSULIN 5.1 03/24/2020   INSULIN 5.9 09/14/2019  Lab Results  Component Value Date   TSH 1.390 09/14/2019   Lab Results  Component Value Date   CHOL 193 03/24/2020   HDL 64 03/24/2020   LDLCALC 117 (H) 03/24/2020   TRIG 66 03/24/2020   CHOLHDL 3.0 03/24/2020   Lab Results  Component Value Date   VD25OH 80.1 09/14/2020   VD25OH 56.4  03/24/2020   VD25OH 41.8 09/14/2019   Lab Results  Component Value Date   WBC 4.4 09/22/2020   HGB 12.5 09/22/2020   HCT 38.8 09/22/2020   MCV 98.7 09/22/2020   PLT 326 09/22/2020   Lab Results  Component Value Date   IRON 111 09/03/2019   TIBC 292 09/03/2019   FERRITIN 187 09/03/2019    Attestation Statements:   Reviewed by clinician on day of visit: allergies, medications, problem list, medical history, surgical history, family history, social history, and previous encounter notes.  Time spent on visit including pre-visit chart review and post-visit care and charting was 15 minutes.   Coral Ceo, CMA, am acting as transcriptionist for Brooke Common, MD.   I have reviewed the above documentation for accuracy and completeness, and I agree with the above. - Brooke Common, MD

## 2020-11-03 ENCOUNTER — Other Ambulatory Visit: Payer: Self-pay | Admitting: Internal Medicine

## 2020-11-11 ENCOUNTER — Other Ambulatory Visit (INDEPENDENT_AMBULATORY_CARE_PROVIDER_SITE_OTHER): Payer: Self-pay | Admitting: Adult Health

## 2020-11-11 DIAGNOSIS — E8881 Metabolic syndrome: Secondary | ICD-10-CM

## 2020-11-12 ENCOUNTER — Ambulatory Visit
Admission: RE | Admit: 2020-11-12 | Discharge: 2020-11-12 | Disposition: A | Discharge status: Home / Self Care | Payer: 59 | Source: Ambulatory Visit | Attending: Student | Admitting: Student

## 2020-11-12 ENCOUNTER — Other Ambulatory Visit: Payer: Self-pay

## 2020-11-12 DIAGNOSIS — M5412 Radiculopathy, cervical region: Secondary | ICD-10-CM

## 2020-11-14 ENCOUNTER — Encounter (INDEPENDENT_AMBULATORY_CARE_PROVIDER_SITE_OTHER): Payer: Self-pay

## 2020-11-14 NOTE — Telephone Encounter (Signed)
LAST APPOINTMENT DATE:10/26/20 NEXT APPOINTMENT DATE: 11/21/20   [id number] - [place], [place]. [address] [address] Phone: [phone] Fax: [phone]  Patient is requesting a refill of the following medications: Pending Prescriptions:                       Disp   Refills   TRULICITY 3 SP/2.3RA SOPN [Pharmacy Med Na*2 mL   0       Sig: ADMINISTER 3 MG UNDER THE SKIN 1 TIME A WEEK ON          SUNDAYS   Date last filled: 10/17/20 Previously prescribed by Katy  Lab Results      Component                Value               Date                      HGBA1C                   5.3                 09/22/2020                HGBA1C                   5.2                 08/26/2020                HGBA1C                   5.1                 03/24/2020           Lab Results      Component                Value               Date                      LDLCALC                  117 (H)             03/24/2020                CREATININE               0.77                09/22/2020           Lab Results      Component                Value               Date                      VD25OH                   80.1                09 /14/2022  VD25OH                   56.4                03/24/2020                VD25OH                   41.8                09/14/2019            BP Readings from Last 3 Encounters: 10/26/20 : 94/66 10/17/20 : 112/75 10/14/20 : 108/72

## 2020-11-14 NOTE — Telephone Encounter (Signed)
Dr. U

## 2020-11-14 NOTE — Telephone Encounter (Signed)
Message sent to pt-CAS 

## 2020-11-16 ENCOUNTER — Other Ambulatory Visit: Payer: Self-pay

## 2020-11-16 ENCOUNTER — Ambulatory Visit
Admission: EM | Admit: 2020-11-16 | Discharge: 2020-11-16 | Disposition: A | Discharge status: Home / Self Care | Payer: 59 | Attending: Family Medicine | Admitting: Family Medicine

## 2020-11-16 DIAGNOSIS — J069 Acute upper respiratory infection, unspecified: Secondary | ICD-10-CM

## 2020-11-16 DIAGNOSIS — R051 Acute cough: Secondary | ICD-10-CM

## 2020-11-16 MED ORDER — HYDROCODONE BIT-HOMATROP MBR 5-1.5 MG/5ML PO SOLN: 5.0000 mL | Freq: Four times a day (QID) | ORAL | 0 refills | Status: DC | PRN

## 2020-11-16 NOTE — Discharge Instructions (Signed)
Be aware, your cough medication may cause drowsiness. Please do not drive, operate heavy machinery or make important decisions while on this medication, it can cloud your judgement.  
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
## 2020-11-16 NOTE — ED Triage Notes (Signed)
PT reports fever, sore throat, cough for 2 days.

## 2020-11-17 LAB — COVID-19, FLU A+B NAA
Influenza A, NAA: DETECTED — AB
Influenza B, NAA: NOT DETECTED
SARS-CoV-2, NAA: NOT DETECTED

## 2020-11-21 ENCOUNTER — Ambulatory Visit (INDEPENDENT_AMBULATORY_CARE_PROVIDER_SITE_OTHER): Payer: 59 | Admitting: Family Medicine

## 2020-11-21 ENCOUNTER — Encounter (INDEPENDENT_AMBULATORY_CARE_PROVIDER_SITE_OTHER): Payer: Self-pay

## 2020-11-22 ENCOUNTER — Ambulatory Visit: Payer: 59 | Admitting: Neurology

## 2020-11-22 ENCOUNTER — Encounter: Payer: Self-pay | Admitting: Neurology

## 2020-11-22 VITALS — BP 119/79 | Ht 62.0 in | Wt 162.5 lb

## 2020-11-22 DIAGNOSIS — F422 Mixed obsessional thoughts and acts: Secondary | ICD-10-CM | POA: Diagnosis not present

## 2020-11-22 DIAGNOSIS — R110 Nausea: Secondary | ICD-10-CM

## 2020-11-22 DIAGNOSIS — Z9884 Bariatric surgery status: Secondary | ICD-10-CM | POA: Diagnosis not present

## 2020-11-22 DIAGNOSIS — G43111 Migraine with aura, intractable, with status migrainosus: Secondary | ICD-10-CM

## 2020-11-22 MED ORDER — AIMOVIG 140 MG/ML ~~LOC~~ SOAJ
140.0000 mg | SUBCUTANEOUS | 4 refills | Status: DC
Start: 2020-11-22 — End: 2021-06-28

## 2020-11-22 NOTE — Progress Notes (Signed)
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 being placed on topiramate, she takes 100 mg daily.  Vitals: BP 119/79   Ht 5\' 2"  (1.575 m)   Wt 162 lb 8 oz (73.7 kg)   LMP 09/01/2020 (Approximate)   SpO2 99%   BMI 29.72 kg/m  Last Weight:  Wt Readings from Last 1 Encounters:  11/22/20 162 lb 8 oz (73.7 kg)   FUX:NATF mass index is 29.72 kg/m.     Last Height:   Ht Readings from Last 1 Encounters:  11/22/20 5\' 2"  (1.575 m)    Physical exam:  General: The patient is awake, alert and appears not in acute distress. The patient is well groomed. Head: Normocephalic, atraumatic. Neck is supple. Mallampati 3 -with lateral pillars crowding the upper airway.  This airway is narrow but not because the soft palate is low.,  neck circumference: 14.75 inches. Nasal airflow congested, TMJ clicks evident,Retrognathia is seen.  Cardiovascular:  Regular rate and rhythm , without  murmurs or carotid bruit, and without distended neck veins. Respiratory: Lungs are clear to auscultation. Skin:  Without evidence of  edema, or rash Trunk: BMI is now 36-kg/m2  from 42.  Gastric bypass.   The patient's posture is erect.  Neurologic exam : The patient is awake and alert, oriented to place and time.   Attention span & concentration ability appears normal.  Speech is fluent,  without  dysarthria, dysphonia or aphasia.  Mood and affect are appropriate.  Cranial nerves: Pupils are equal and briskly reactive to light. Funduscopic exam deferred.  Extraocular movements  in vertical and horizontal planes intact and without nystagmus. Visual fields by finger perimetry are intact. Hearing to finger rub intact.   Facial sensation intact to fine touch.  Facial motor strength is symmetric and tongue and uvula move midline. Shoulder shrug was symmetrical.   Motor exam:   Normal tone, muscle bulk and symmetric strength in all extremities. Sensory:  Fine touch, pinprick and vibration were tested in all extremities. Proprioception tested in the  upper extremities was normal. Coordination: Rapid alternating movements in the fingers/hands was normal. Finger-to-nose maneuver  normal without evidence of ataxia, dysmetria or tremor. Gait and station:   Patient walks without assistive device . Strength within normal limits.  Stance is stable and normal. Tandem gait is unfragmented. Turns with 3 Steps. Deep tendon reflexes: in the upper extremities are symmetrically attenuated, as to the lower extremities :  the left patella was brisk- and intact. Babinski maneuver response is  Downgoing.       Assessment:  After physical and neurologic examination, review of laboratory studies,  Personal review of imaging studies, reports of other /same  Imaging studies, results of polysomnography and / or neurophysiology testing and pre-existing records as far as provided in visit., my assessment is   Brooke [name] has a variety of symptoms that could have different disorders in origin.  She does have a long-standing history of rather severe  migraines which have improved since she is on topiramate. She feels fatigued and daytime sleepy, but doesn't endorse symptoms of narcolepsy or cataplexy.   0) Daily  Migraine , photophobia, nausea, right side of the head affected, Imitrex brought no relief, and Topiramate at 200 mg bid has caused memory problems.  I recommend Emgality or Aimovig injection q 30 days.  Patient cannot have beta-blocker, has been hypotensive.  Cannot use depakote with Gastric bypass.    1) generalized anxiety disorder , manifesting in panic attacks, breath holding spells/ Vertigo - worse when stressed. non epileptic spells, under stress, chronic sleep deprivation, asxiety and depression.   2)Status post gastric bypass - no longer losing weight. Unable to NSAIDS, this is not a overuse headache.    The patient was advised of the nature of the diagnosed disorder , the treatment options and the  risks for general health and wellness arising from not treating the condition.   I spent more than 45 minutes of face to face time with the patient.  Greater than 50% of time was spent in counseling and coordination of care. We have discussed the diagnosis and differential and I answered the patient's questions.    Plan:  Treatment plan and additional workup :  Reduce Topiramate to 100 mg bid. Start [place]  or [place], [place] required   RV in 3-5 months with NP- this is not a new problem and could have been evaluated by NP   Conttinue cognitive behavior therapy- stress and anxiety. (72 hour EEG negative)  Very mild obstructive sleep apnea by HST and even less by PSG - MSLT was negative.     RV with Np    Larey Seat, MD 66/44/0347, 42:59 AM  Certified in Neurology by ABPN Certified in [place] by [facility] [address] [place], [place] [address]

## 2020-11-25 ENCOUNTER — Other Ambulatory Visit (INDEPENDENT_AMBULATORY_CARE_PROVIDER_SITE_OTHER): Payer: Self-pay | Admitting: Adult Health

## 2020-11-25 ENCOUNTER — Other Ambulatory Visit: Payer: Self-pay | Admitting: Internal Medicine

## 2020-11-25 DIAGNOSIS — E8881 Metabolic syndrome: Secondary | ICD-10-CM

## 2020-11-28 NOTE — Telephone Encounter (Signed)
LOV with Dr. U

## 2020-11-28 NOTE — Telephone Encounter (Signed)
LAST APPOINTMENT DATE: 10/26/20 NEXT APPOINTMENT DATE: 12/13/20   [id number] - [place], [place] [address] [place] [address] Phone: [phone] Fax: [phone]  Patient is requesting a refill of the following medications: No prescriptions requested or ordered in this encounter   Date last filled: 10/17/20 Previously prescribed by [facility]  Lab Results      Component                Value               Date                      HGBA1C                   5.3                 09/22/2020                HGBA1C                   5.2                 08/26/2020                HGBA1C                   5.1                 03/24/2020           Lab Results      Component                Value               Date                      LDLCALC                  117 (H)             03/24/2020                CREATININE               0.77                09/22/2020           Lab Results      Component                Value               Date                      VD25OH                   80.1                09/14/2020                VD25OH                   56.4                03/24/2020                VD25OH  41.8                09/14/2019            BP Readings from Last 3 Encounters: 11/22/20 : 119/79 11/16/20 : 129/86 10/26/20 : 26/71

## 2020-11-28 NOTE — Telephone Encounter (Signed)
See prev message for refill

## 2020-11-29 ENCOUNTER — Telehealth: Payer: Self-pay | Admitting: *Deleted

## 2020-11-29 NOTE — Telephone Encounter (Signed)
Submitted PA Aimovig on CMM. Key: [id number]. Received instant approval:   "The request has been approved. The authorization is effective for a maximum of 6 fills from 11/29/2020 to 05/28/2021, as long as the member is enrolled in their current health plan. The request was approved with a quantity restriction. This has been approved for a quantity limit of 1 with a day supply limit of 30. A written notification letter will follow with additional details."

## 2020-12-06 ENCOUNTER — Telehealth: Payer: Self-pay | Admitting: Neurology

## 2020-12-06 ENCOUNTER — Telehealth: Payer: Self-pay | Admitting: Internal Medicine

## 2020-12-06 MED ORDER — NURTEC 75 MG PO TBDP: 75.0000 mg | ORAL_TABLET | ORAL | 5 refills | Status: DC

## 2020-12-06 MED ORDER — ONDANSETRON HCL 4 MG PO TABS: 4.0000 mg | ORAL_TABLET | Freq: Three times a day (TID) | ORAL | 0 refills | Status: DC | PRN

## 2020-12-06 NOTE — Telephone Encounter (Signed)
Called the patient back.  Patient is continued to have headaches/migraines that are occurring pretty frequently.  She took her first dose of Aimovig on 12/1.  Advised the patient that this can take 2 to 3 months before noticing the effects of the medication.  Recommended the patient to continue taking aimovig to see if once is in her system she notices benefit.  Patient complaints of nausea that is causing difficulty for her to eat.  Patient is currently not on anything to help with nausea.  Patient has tried Zofran in the past and states that has work: ed well.  Advised I would talk to the doctor about calling in something for nausea.  I have taken her topiramate off of her medication list as she is no longer taking that medication due to side effects.  Per documentation, the patient is unable to take beta-blockers and Depakote for medical reasons.  She has attempted the use of her sumatriptan for acute intervention and it was not helpful.  In the past she had been prescribed Fioricet and had some at home, around 2:45 AM this morning she took one.  Patient states that it provided some relief but did not take away the migraine. Tried and failed medications for preventative include: Keppra, Effexor, Topamax, Wellbutrin, BuSpar, Lexapro, gabapentin, imipramine, metoprolol, sertraline.  Tried and Failed acute/rescue meds: fiorcet, flexiril, robaxin, tramadol, tizanidine, sumatriptan, naproxen, ibuprofen  Advised I would discuss with Dr Brett Fairy about adding another medication (? Nurtec) to use either as acute or preventative.  Pt has used steroid dose pack in the past and tolerated well.  We will see what Dr Brett Fairy recommends and call back with recommendation.

## 2020-12-06 NOTE — Telephone Encounter (Signed)
Pt will need to keep appt to discuss or can do a work in phone appt to discuss

## 2020-12-06 NOTE — Addendum Note (Signed)
Addended by: Darleen Crocker on: 12/06/2020 04:31 PM   Modules accepted: Orders

## 2020-12-06 NOTE — Telephone Encounter (Signed)
Pt called states the Erenumab-aooe (AIMOVIG) 140 MG/ML SOAJ has made her very nauseous and has not helped her migraines at all. She took the medicine on 12/01/2020 and it hasn't helped since then.

## 2020-12-06 NOTE — Telephone Encounter (Signed)
Called the pt back and was able to review with her that Dr Brett Fairy is ok with trying the patient on nurtec every other day along with using zofran for nausea. Advised these will be called into the pharmacy. Most likely it will need a prior auth. Pt verbalized understanding and was appreciative for the call back

## 2020-12-06 NOTE — Telephone Encounter (Signed)
Pt called in regard to Migraines  Pt states that they are becoming worse. Pt is nauseas and not eating well   Pt wants to discuss   Pt has appt 12/12

## 2020-12-11 ENCOUNTER — Other Ambulatory Visit: Payer: Self-pay | Admitting: Internal Medicine

## 2020-12-11 DIAGNOSIS — G43719 Chronic migraine without aura, intractable, without status migrainosus: Secondary | ICD-10-CM

## 2020-12-12 ENCOUNTER — Ambulatory Visit: Payer: 59 | Admitting: Internal Medicine

## 2020-12-13 ENCOUNTER — Encounter (INDEPENDENT_AMBULATORY_CARE_PROVIDER_SITE_OTHER): Payer: Self-pay | Admitting: Family Medicine

## 2020-12-13 ENCOUNTER — Ambulatory Visit (INDEPENDENT_AMBULATORY_CARE_PROVIDER_SITE_OTHER): Payer: 59 | Admitting: Family Medicine

## 2020-12-13 ENCOUNTER — Other Ambulatory Visit: Payer: Self-pay

## 2020-12-13 VITALS — BP 108/72 | HR 88 | Temp 98.1°F | Ht 62.0 in | Wt 159.0 lb

## 2020-12-13 DIAGNOSIS — Z6839 Body mass index (BMI) 39.0-39.9, adult: Secondary | ICD-10-CM

## 2020-12-13 DIAGNOSIS — E559 Vitamin D deficiency, unspecified: Secondary | ICD-10-CM

## 2020-12-13 DIAGNOSIS — E1165 Type 2 diabetes mellitus with hyperglycemia: Secondary | ICD-10-CM | POA: Diagnosis not present

## 2020-12-13 MED ORDER — TRULICITY 3 MG/0.5ML ~~LOC~~ SOAJ
3.0000 mg | SUBCUTANEOUS | 0 refills | Status: DC
Start: 2020-12-13 — End: 2021-01-05

## 2020-12-13 NOTE — Progress Notes (Signed)
Chief Complaint:   OBESITY Brooke [name] is here to discuss her progress with her obesity treatment plan along with follow-up of her obesity related diagnoses. Brooke [name] is on the [employer] and states she is following her eating plan approximately 80% of the time. Brooke [name] states she is walking 30 minutes 3 times per week.  Today's visit was #: 65 Starting weight: 217 lbs Starting date: 09/14/2019 Today's weight: 159 lbs Today's date: 12/13/2020 Total lbs lost to date: 6 Total lbs lost since last in-office visit: 4  Interim History: Pt had a nice Thanksgiving and did have a slice of dessert, but otherwise on plan. 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 Pt is staying local for Christmas. She does not voice any change needs to be made to meal plan.  Subjective:   1. Type 2 diabetes mellitus with hyperglycemia, without long-term current use of insulin (HCC) Brooke [name] is on Trulicity, and she denies GI side effects but good satiety.  2. Vitamin D deficiency Her last Vit D lab was 80 in September. Pt is not on a supplement.  Assessment/Plan:   1. Type 2 diabetes mellitus with hyperglycemia, without long-term current use of insulin (HCC) Good blood sugar control is important to decrease the likelihood of diabetic complications such as nephropathy, neuropathy, limb loss, blindness, coronary artery disease, and death. Intensive lifestyle modification including diet, exercise and weight loss are the first line of treatment for diabetes.   Refill- Dulaglutide (TRULICITY) 3 IR/5.1OA SOPN; Inject 3 mg into the skin once a week.  Dispense: 2 mL; Refill: 0  2. Vitamin D deficiency Low Vitamin D level contributes to fatigue and are associated with obesity, breast, and colon cancer. She will follow-up for routine testing of Vitamin D, at least 2-3 times per year to avoid over-replacement. Retest level in February 2023.  3. Obesity with current BMI of  29.2  Brooke [name] is currently in the action stage of change. As such, her goal is to continue with weight loss efforts. She has agreed to the [employer].   Exercise goals: All adults should avoid inactivity. Some physical activity is better than none, and adults who participate in any amount of physical activity gain some health benefits. Pt is to form a plan for exercise over the next month.  Behavioral modification strategies: increasing lean protein intake, meal planning and cooking strategies, keeping healthy foods in the home, holiday eating strategies , and planning for success.  Brooke [name] has agreed to follow-up with our clinic in 4 weeks. She was informed of the importance of frequent follow-up visits to maximize her success with intensive lifestyle modifications for her multiple health conditions.   Objective:   Blood pressure 108/72, pulse 88, temperature 98.1 F (36.7 C), height 5\' 2"  (1.575 m), weight 159 lb (72.1 kg), last menstrual period 09/01/2020, SpO2 100 %. Body mass index is 29.08 kg/m.  General: Cooperative, alert, well developed, in no acute distress. HEENT: Conjunctivae and lids unremarkable. Cardiovascular: Regular rhythm.  Lungs: Normal work of breathing. Neurologic: No focal deficits.   Lab Results  Component Value Date   CREATININE 0.77 09/22/2020   BUN 9 09/22/2020   NA 137 09/22/2020   K 3.7 09/22/2020   CL 111 09/22/2020   CO2 21 (L) 09/22/2020   Lab Results  Component Value Date   ALT 21 08/26/2020   AST 20 08/26/2020   ALKPHOS 68 08/26/2020   BILITOT 0.4 08/26/2020  Lab Results  Component Value Date   HGBA1C 5.3 09/22/2020   HGBA1C 5.2 08/26/2020   HGBA1C 5.1 03/24/2020   HGBA1C 5.2 09/14/2019   HGBA1C 5.3 08/05/2019   Lab Results  Component Value Date   INSULIN 5.0 09/14/2020   INSULIN 5.1 03/24/2020   INSULIN 5.9 09/14/2019   Lab Results  Component Value Date   TSH 1.390 09/14/2019   Lab Results  Component Value Date   CHOL  193 03/24/2020   HDL 64 03/24/2020   LDLCALC 117 (H) 03/24/2020   TRIG 66 03/24/2020   CHOLHDL 3.0 03/24/2020   Lab Results  Component Value Date   VD25OH 80.1 09/14/2020   VD25OH 56.4 03/24/2020   VD25OH 41.8 09/14/2019   Lab Results  Component Value Date   WBC 4.4 09/22/2020   HGB 12.5 09/22/2020   HCT 38.8 09/22/2020   MCV 98.7 09/22/2020   PLT 326 09/22/2020   Lab Results  Component Value Date   IRON 111 09/03/2019   TIBC 292 09/03/2019   FERRITIN 187 09/03/2019    Attestation Statements:   Reviewed by clinician on day of visit: allergies, medications, problem list, medical history, surgical history, family history, social history, and previous encounter notes.  Coral Ceo, CMA, am acting as transcriptionist for Coralie Common, MD.   I have reviewed the above documentation for accuracy and completeness, and I agree with the above. - Coralie Common, MD

## 2020-12-16 ENCOUNTER — Ambulatory Visit (INDEPENDENT_AMBULATORY_CARE_PROVIDER_SITE_OTHER): Payer: 59 | Admitting: Internal Medicine

## 2020-12-16 ENCOUNTER — Encounter: Payer: Self-pay | Admitting: Internal Medicine

## 2020-12-16 ENCOUNTER — Other Ambulatory Visit: Payer: Self-pay

## 2020-12-16 DIAGNOSIS — R42 Dizziness and giddiness: Secondary | ICD-10-CM | POA: Diagnosis not present

## 2020-12-16 MED ORDER — MECLIZINE HCL 25 MG PO TABS: 25.0000 mg | ORAL_TABLET | Freq: Three times a day (TID) | ORAL | 1 refills | Status: DC | PRN
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
## 2020-12-23 ENCOUNTER — Other Ambulatory Visit: Payer: Self-pay | Admitting: Family Medicine

## 2020-12-29 ENCOUNTER — Other Ambulatory Visit (INDEPENDENT_AMBULATORY_CARE_PROVIDER_SITE_OTHER): Payer: Self-pay | Admitting: Family Medicine

## 2020-12-29 ENCOUNTER — Telehealth: Payer: Self-pay | Admitting: Neurology

## 2020-12-29 DIAGNOSIS — E559 Vitamin D deficiency, unspecified: Secondary | ICD-10-CM

## 2020-12-29 NOTE — Telephone Encounter (Signed)
Called pt back and informed her PA approved 12/29/20-06/28/2021. #18/30 days (even though PA submitted for #16/30 days). PA ref# [id number]. She should now be able to fill at the pharmacy. She verbalized understanding and appreciation.

## 2020-12-29 NOTE — Telephone Encounter (Signed)
Called pt back. Relayed we completed urgent PA, waiting on determination from insurance. We will update her once we hear back from insurance. She verbalized understanding.

## 2020-12-29 NOTE — Telephone Encounter (Signed)
Called pharmacy. Spoke w/ tech. Confirmed PA needed. They sent request 12/09/20 but aware we did not receive. Aware we will initiate PA case and provide update once we hear from insurance. She verbalized understanding.  Confirmed pt insurance will [employer].   Initiated urgent PA on CMM. Key: [id number]. Waiting on determination from [employer].

## 2020-12-29 NOTE — Telephone Encounter (Signed)
Pt called saying her ins will not cover the Rimegepant Sulfate (NURTEC) 75 MG TBDP. Is there anything we can do to help her because her other meds are not helping she says.

## 2021-01-02 ENCOUNTER — Other Ambulatory Visit: Payer: Self-pay

## 2021-01-02 ENCOUNTER — Ambulatory Visit (INDEPENDENT_AMBULATORY_CARE_PROVIDER_SITE_OTHER): Payer: 59 | Admitting: Internal Medicine

## 2021-01-02 ENCOUNTER — Encounter: Payer: Self-pay | Admitting: Internal Medicine

## 2021-01-02 ENCOUNTER — Encounter (INDEPENDENT_AMBULATORY_CARE_PROVIDER_SITE_OTHER): Payer: Self-pay

## 2021-01-02 VITALS — BP 132/78 | HR 74 | Resp 18 | Ht 62.0 in | Wt 160.0 lb

## 2021-01-02 DIAGNOSIS — R42 Dizziness and giddiness: Secondary | ICD-10-CM | POA: Diagnosis not present

## 2021-01-02 NOTE — Patient Instructions (Signed)
Please maintain adequate hydration by taking at least 64 ounces of fluid in a day.  Please eat at regular intervals to avoid hypoglycemia.  Please take Meclizine as needed for dizziness.  Please perform vestibular exercises at home as discussed today in the office.

## 2021-01-03 NOTE — Telephone Encounter (Signed)
Please advise 

## 2021-01-05 ENCOUNTER — Other Ambulatory Visit (INDEPENDENT_AMBULATORY_CARE_PROVIDER_SITE_OTHER): Payer: Self-pay | Admitting: Family Medicine

## 2021-01-05 ENCOUNTER — Telehealth: Payer: Self-pay | Admitting: Obstetrics & Gynecology

## 2021-01-05 DIAGNOSIS — E1165 Type 2 diabetes mellitus with hyperglycemia: Secondary | ICD-10-CM

## 2021-01-05 MED ORDER — OZEMPIC (0.25 OR 0.5 MG/DOSE) 2 MG/1.5ML ~~LOC~~ SOPN: 0.5000 mg | PEN_INJECTOR | SUBCUTANEOUS | 0 refills | Status: DC

## 2021-01-05 NOTE — Telephone Encounter (Signed)
Pt has found a knot in left breast. Pt advised she would need an appt with one of our providers before referral could be done. Call transferred to [facility] for appt. [place]

## 2021-01-05 NOTE — Telephone Encounter (Signed)
Patient called stating that she found a knot on her left breast and she would like Dr. Nelda Marseille or another provider to write a referral for her to be able to go to the [facility] in [place]. Please contact pt when referral has been made.

## 2021-01-05 NOTE — Telephone Encounter (Signed)
Please advise 

## 2021-01-06 ENCOUNTER — Encounter: Payer: Self-pay | Admitting: Internal Medicine

## 2021-01-06 ENCOUNTER — Ambulatory Visit: Payer: 59 | Admitting: Obstetrics & Gynecology
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
## 2021-01-06 NOTE — Assessment & Plan Note (Addendum)
Dizziness could be due to vertigo as she has worsening of symptoms with position change Also has nausea Meclizine as needed Advised to perform simple vestibular exercises at home, material provided Avoid sudden positional changes Advised to maintain adequate hydration and eat at regular intervals to avoid hypoglycemia as she is on Ozempic for weight loss If persistent, will refer to ENT specialist

## 2021-01-08 ENCOUNTER — Other Ambulatory Visit: Payer: Self-pay | Admitting: Psychiatry

## 2021-01-08 ENCOUNTER — Other Ambulatory Visit: Payer: Self-pay | Admitting: Internal Medicine

## 2021-01-09 ENCOUNTER — Other Ambulatory Visit: Payer: Self-pay | Admitting: Internal Medicine

## 2021-01-09 NOTE — Telephone Encounter (Signed)
Can you add the ins info in? thanks

## 2021-01-11 ENCOUNTER — Other Ambulatory Visit: Payer: Self-pay

## 2021-01-11 ENCOUNTER — Ambulatory Visit (INDEPENDENT_AMBULATORY_CARE_PROVIDER_SITE_OTHER): Payer: Self-pay | Admitting: Family Medicine

## 2021-01-11 ENCOUNTER — Encounter: Payer: Self-pay | Admitting: Adult Health

## 2021-01-11 ENCOUNTER — Ambulatory Visit (INDEPENDENT_AMBULATORY_CARE_PROVIDER_SITE_OTHER): Payer: 59 | Admitting: Adult Health

## 2021-01-11 ENCOUNTER — Encounter (INDEPENDENT_AMBULATORY_CARE_PROVIDER_SITE_OTHER): Payer: Self-pay | Admitting: Family Medicine

## 2021-01-11 ENCOUNTER — Ambulatory Visit: Payer: 59 | Admitting: Advanced Practice Midwife

## 2021-01-11 VITALS — BP 116/79 | HR 76 | Temp 98.3°F | Ht 62.0 in | Wt 160.0 lb

## 2021-01-11 VITALS — BP 116/78 | HR 69 | Ht 62.0 in | Wt 165.0 lb

## 2021-01-11 DIAGNOSIS — E559 Vitamin D deficiency, unspecified: Secondary | ICD-10-CM

## 2021-01-11 DIAGNOSIS — E669 Obesity, unspecified: Secondary | ICD-10-CM

## 2021-01-11 DIAGNOSIS — N6312 Unspecified lump in the right breast, upper inner quadrant: Secondary | ICD-10-CM | POA: Diagnosis not present

## 2021-01-11 DIAGNOSIS — E1165 Type 2 diabetes mellitus with hyperglycemia: Secondary | ICD-10-CM

## 2021-01-11 DIAGNOSIS — Z6829 Body mass index (BMI) 29.0-29.9, adult: Secondary | ICD-10-CM

## 2021-01-11 DIAGNOSIS — Z6839 Body mass index (BMI) 39.0-39.9, adult: Secondary | ICD-10-CM

## 2021-01-11 NOTE — Progress Notes (Signed)
°  Subjective:     Patient ID: Brooke [name], female   DOB: 11-Apr-1970, 51 y.o.   MRN: [id number]  HPI Brooke [name] is a 51 year old black female, married, sp hysterectomy, (09/27/20), in complaining lump in right breast, noticed x 1 month.Has noticed some tenderness lately. PCP is Dr Posey Pronto. Lab Results  Component Value Date   DIAGPAP  03/05/2019    - Negative for intraepithelial lesion or malignancy (NILM)   [place] Negative 03/05/2019    Review of Systems Right breast lump x 1 month, some tenderness Reviewed past medical,surgical, social and family history. Reviewed medications and allergies.     Objective:   Physical Exam BP 116/78 (BP Location: Left Arm, Patient Position: Sitting, Cuff Size: Normal)    Pulse 69    Ht 5\' 2"  (1.575 m)    Wt 165 lb (74.8 kg)    LMP 09/01/2020 (Approximate)    BMI 30.18 kg/m      Skin warm and dry,  Breasts:no dominate palpable mass, retraction or nipple discharge on the left, on the right, no retraction or nipple discharge, has irregular area and mass at 1 0' clock about 4 FB from nipple, it is tender  Fall risk is low  [facility] - 01/11/21 1356       Pregnancy Intention Screening   Does the patient want to become pregnant in the next year? N/A    Does the patient's partner want to become pregnant in the next year? N/A    Would the patient like to discuss contraceptive options today? N/A      Contraception Wrap Up   Current Method Female Sterilization   hyst   End Method Female Sterilization   hyst   Contraception Counseling Provided No             Assessment:     1. Mass of upper inner quadrant of right breast Diagnostic bilateral  mammogram and [place] scheduled for her 02/07/21 at [facility] at 2:20 pm - US BREAST LTD UNI RIGHT INC AXILLA; Future - MM DIAG BREAST TOMO BILATERAL; Future - US BREAST LTD UNI LEFT INC AXILLA; Future     Plan:     Follow up prn

## 2021-01-11 NOTE — Telephone Encounter (Signed)
Waiting for PA
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
## 2021-01-12 ENCOUNTER — Telehealth (INDEPENDENT_AMBULATORY_CARE_PROVIDER_SITE_OTHER): Payer: 59 | Admitting: Psychiatry

## 2021-01-12 ENCOUNTER — Encounter: Payer: Self-pay | Admitting: Psychiatry

## 2021-01-12 DIAGNOSIS — F410 Panic disorder [episodic paroxysmal anxiety] without agoraphobia: Secondary | ICD-10-CM

## 2021-01-12 DIAGNOSIS — F331 Major depressive disorder, recurrent, moderate: Secondary | ICD-10-CM | POA: Diagnosis not present

## 2021-01-12 LAB — COMPREHENSIVE METABOLIC PANEL
ALT: 27 IU/L (ref 0–32)
AST: 26 IU/L (ref 0–40)
Albumin/Globulin Ratio: 1.8 (ref 1.2–2.2)
Albumin: 4 g/dL (ref 3.8–4.8)
Alkaline Phosphatase: 86 IU/L (ref 44–121)
BUN/Creatinine Ratio: 9 (ref 9–23)
BUN: 7 mg/dL (ref 6–24)
Bilirubin Total: 0.3 mg/dL (ref 0.0–1.2)
CO2: 25 mmol/L (ref 20–29)
Calcium: 8.6 mg/dL — ABNORMAL LOW (ref 8.7–10.2)
Chloride: 106 mmol/L (ref 96–106)
Creatinine, Ser: 0.78 mg/dL (ref 0.57–1.00)
Globulin, Total: 2.2 g/dL (ref 1.5–4.5)
Glucose: 86 mg/dL (ref 70–99)
Potassium: 5 mmol/L (ref 3.5–5.2)
Sodium: 140 mmol/L (ref 134–144)
Total Protein: 6.2 g/dL (ref 6.0–8.5)
eGFR: 92 mL/min/{1.73_m2} (ref >59)

## 2021-01-12 LAB — HEMOGLOBIN A1C
Est. average glucose Bld gHb Est-mCnc: 97 mg/dL
Hgb A1c MFr Bld: 5 % (ref 4.8–5.6)

## 2021-01-12 LAB — LIPID PANEL WITH LDL/HDL RATIO
Cholesterol, Total: 179 mg/dL (ref 100–199)
HDL: 56 mg/dL (ref >39)
LDL Chol Calc (NIH): 113 mg/dL — ABNORMAL HIGH (ref 0–99)
LDL/HDL Ratio: 2 ratio (ref 0.0–3.2)
Triglycerides: 49 mg/dL (ref 0–149)
VLDL Cholesterol Cal: 10 mg/dL (ref 5–40)

## 2021-01-12 LAB — INSULIN, RANDOM: INSULIN: 7.9 u[IU]/mL (ref 2.6–24.9)

## 2021-01-12 LAB — VITAMIN D 25 HYDROXY (VIT D DEFICIENCY, FRACTURES): Vit D, 25-Hydroxy: 48.5 ng/mL (ref 30.0–100.0)

## 2021-01-12 MED ORDER — TRULICITY 3 MG/0.5ML ~~LOC~~ SOAJ: 3.0000 mg | SUBCUTANEOUS | 0 refills | Status: DC

## 2021-01-12 NOTE — Progress Notes (Signed)
Chief Complaint:   OBESITY Brooke [name] is here to discuss her progress with her obesity treatment plan along with follow-up of her obesity related diagnoses. Brooke [name] is on the [employer] and states she is following her eating plan approximately 60% of the time. Brooke [name] states she is not currently exercising.  Today's visit was #: 63 Starting weight: 217 lbs Starting date: 09/14/2019 Today's weight: 160 lbs Today's date: 01/11/2021 Total lbs lost to date: 51 Total lbs lost since last in-office visit: 0  Interim History: Pt had a good holiday- did eat a bit of indulgent food. She has been able to pull herself back on plan. She feels good sticking to [employer] plan. She is eating mostly vegetables with occasional fish. Her biggest obstacle in the next few weeks is trying to stay on task- wants to start exercising.  Subjective:   1. Vitamin D deficiency Pt denies nausea, vomiting, and muscle weakness. Her last Vit D level was 80. She is not on OTC Vit D.  2. Type 2 diabetes mellitus with hyperglycemia, without long-term current use of insulin (HCC) Pt was on Trulicity previously with no issues but with national drug shortage, it has been difficult to find.  Assessment/Plan:   1. Vitamin D deficiency Low Vitamin D level contributes to fatigue and are associated with obesity, breast, and colon cancer. She agrees to follow-up for routine testing of Vitamin D, at least 2-3 times per year to avoid over-replacement. Check labs today.  - VITAMIN D 25 Hydroxy (Vit-D Deficiency, Fractures)  2. Type 2 diabetes mellitus with hyperglycemia, without long-term current use of insulin (HCC) Good blood sugar control is important to decrease the likelihood of diabetic complications such as nephropathy, neuropathy, limb loss, blindness, coronary artery disease, and death. Intensive lifestyle modification including diet, exercise and weight loss are the first line of treatment for diabetes. Check labs  today. Follow up on prior Auth for Ozempic.  - Hemoglobin A1c - Insulin, random - Lipid Panel With LDL/HDL Ratio - Comprehensive metabolic panel  Refill- Dulaglutide (TRULICITY) 3 MA/2.6JF SOPN; Inject 3 mg into the skin once a week.  Dispense: 2 mL; Refill: 0  3. Obesity with current BMI of 29.2  Brooke [name] is currently in the action stage of change. As such, her goal is to continue with weight loss efforts. She has agreed to the [employer].   Exercise goals: All adults should avoid inactivity. Some physical activity is better than none, and adults who participate in any amount of physical activity gain some health benefits.  Behavioral modification strategies: increasing lean protein intake, meal planning and cooking strategies, and keeping healthy foods in the home.  Brooke [name] has agreed to follow-up with our clinic in 4 weeks. She was informed of the importance of frequent follow-up visits to maximize her success with intensive lifestyle modifications for her multiple health conditions.   Brooke [name] was informed we would discuss her lab results at her next visit unless there is a critical issue that needs to be addressed sooner. Brooke [name] agreed to keep her next visit at the agreed upon time to discuss these results.  Objective:   Blood pressure 116/79, pulse 76, temperature 98.3 F (36.8 C), height 5\' 2"  (1.575 m), weight 160 lb (72.6 kg), last menstrual period 09/01/2020, SpO2 98 %. Body mass index is 29.26 kg/m.  General: Cooperative, alert, well developed, in no acute distress. HEENT: Conjunctivae and lids unremarkable. Cardiovascular: Regular rhythm.  Lungs: Normal work of breathing. Neurologic: No focal deficits.  Lab Results  Component Value Date   CREATININE 0.78 01/11/2021   BUN 7 01/11/2021   NA 140 01/11/2021   K 5.0 01/11/2021   CL 106 01/11/2021   CO2 25 01/11/2021   Lab Results  Component Value Date   ALT 27 01/11/2021   AST 26 01/11/2021   ALKPHOS 86  01/11/2021   BILITOT 0.3 01/11/2021   Lab Results  Component Value Date   HGBA1C 5.0 01/11/2021   HGBA1C 5.3 09/22/2020   HGBA1C 5.2 08/26/2020   HGBA1C 5.1 03/24/2020   HGBA1C 5.2 09/14/2019   Lab Results  Component Value Date   INSULIN 7.9 01/11/2021   INSULIN 5.0 09/14/2020   INSULIN 5.1 03/24/2020   INSULIN 5.9 09/14/2019   Lab Results  Component Value Date   TSH 1.390 09/14/2019   Lab Results  Component Value Date   CHOL 179 01/11/2021   HDL 56 01/11/2021   LDLCALC 113 (H) 01/11/2021   TRIG 49 01/11/2021   CHOLHDL 3.0 03/24/2020   Lab Results  Component Value Date   VD25OH 48.5 01/11/2021   VD25OH 80.1 09/14/2020   VD25OH 56.4 03/24/2020   Lab Results  Component Value Date   WBC 4.4 09/22/2020   HGB 12.5 09/22/2020   HCT 38.8 09/22/2020   MCV 98.7 09/22/2020   PLT 326 09/22/2020   Lab Results  Component Value Date   IRON 111 09/03/2019   TIBC 292 09/03/2019   FERRITIN 187 09/03/2019   Attestation Statements:   Reviewed by clinician on day of visit: allergies, medications, problem list, medical history, surgical history, family history, social history, and previous encounter notes.  Coral Ceo, CMA, am acting as transcriptionist for Coralie Common, MD.   I have reviewed the above documentation for accuracy and completeness, and I agree with the above. - Coralie Common, MD

## 2021-01-12 NOTE — Patient Instructions (Signed)
Change medication as follows:  Week 1: Decrease venlafaxine 150 mg daily, start desipramine 25 mg daily Week 2: Decrease venlafaxine 75 mg daily, increase desipramine to 50 mg daily Week 3-discontinue venlafaxine, continue desipramine 50 mg daily 2. Discontinue Rexulti 3. Next appointment: 2/27 at 11:30, video

## 2021-01-14 ENCOUNTER — Ambulatory Visit
Admission: EM | Admit: 2021-01-14 | Discharge: 2021-01-14 | Disposition: A | Discharge status: Home / Self Care | Payer: 59 | Attending: Family Medicine | Admitting: Family Medicine

## 2021-01-14 DIAGNOSIS — R509 Fever, unspecified: Secondary | ICD-10-CM

## 2021-01-14 DIAGNOSIS — R52 Pain, unspecified: Secondary | ICD-10-CM | POA: Diagnosis not present

## 2021-01-14 DIAGNOSIS — Z20828 Contact with and (suspected) exposure to other viral communicable diseases: Secondary | ICD-10-CM

## 2021-01-14 DIAGNOSIS — R110 Nausea: Secondary | ICD-10-CM | POA: Diagnosis not present

## 2021-01-14 DIAGNOSIS — R5383 Other fatigue: Secondary | ICD-10-CM | POA: Diagnosis not present

## 2021-01-14 MED ORDER — ONDANSETRON 4 MG PO TBDP: 4.0000 mg | ORAL_TABLET | Freq: Three times a day (TID) | ORAL | 0 refills | Status: DC | PRN

## 2021-01-14 MED ORDER — ONDANSETRON 4 MG PO TBDP
4.0000 mg | ORAL_TABLET | Freq: Once | ORAL | Status: AC
  Administered 2021-01-14: 4 mg via ORAL

## 2021-01-14 NOTE — ED Triage Notes (Signed)
Patient states that on Thursday she got dizzy, nauseated and chills.   Patient states she has had a headache for 3 to 4 days   Patient states she ahs taken Tylenol and meclizine for her symptoms. Last dose thsi morning at 7am
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
## 2021-01-15 LAB — COVID-19, FLU A+B NAA
Influenza A, NAA: NOT DETECTED
Influenza B, NAA: NOT DETECTED
SARS-CoV-2, NAA: NOT DETECTED

## 2021-01-16 ENCOUNTER — Ambulatory Visit: Payer: 59 | Admitting: Adult Health

## 2021-01-16 ENCOUNTER — Encounter (INDEPENDENT_AMBULATORY_CARE_PROVIDER_SITE_OTHER): Payer: Self-pay

## 2021-01-16 ENCOUNTER — Other Ambulatory Visit (INDEPENDENT_AMBULATORY_CARE_PROVIDER_SITE_OTHER): Payer: Self-pay

## 2021-01-16 DIAGNOSIS — E1165 Type 2 diabetes mellitus with hyperglycemia: Secondary | ICD-10-CM

## 2021-01-16 MED ORDER — TRULICITY 3 MG/0.5ML ~~LOC~~ SOAJ: 3.0000 mg | SUBCUTANEOUS | 0 refills | Status: DC

## 2021-01-18 ENCOUNTER — Telehealth: Payer: Self-pay | Admitting: Neurology

## 2021-01-18 ENCOUNTER — Other Ambulatory Visit: Payer: Self-pay | Admitting: Neurology

## 2021-01-18 MED ORDER — PROPRANOLOL HCL 60 MG PO TABS: 60.0000 mg | ORAL_TABLET | Freq: Every evening | ORAL | 0 refills | Status: DC

## 2021-01-18 NOTE — Telephone Encounter (Signed)
Pt states in spite of the medications Dr Brett Fairy has her on she has daily headaches which seem to be worsening, please call.

## 2021-01-18 NOTE — Telephone Encounter (Signed)
Pt continues to have daily headaches and migraines that are occurring. She has previously tried and failed topiramate, effexor(still takes for something else), keppra (still takes for something else).  Currently started aimovig first of December and started with the nurtec every other day and nothing is helping. Advised the patient that aimovig can take a few months before seeing benefit. Pt states that it can become so uncomfortable that she would like to know if there is something else worth trying.  Pt does not have any contraindications to taking a beta blocker and has not tried one in the past. Advised I would question with Dr Brett Fairy about whether she would like to add a betablocker (propranolol) to her daily regimen to see if that helps or if she has an alternative recommendation of something to try

## 2021-01-18 NOTE — Telephone Encounter (Signed)
Called the pt and advised Dr Brett Fairy did recommend trying propranolol 60 mg at bedtime for preventative treatment. I have sent a 30 day supply to the pharmacy. Advised the pt to keep an eye out on her heart rate and BP in the morning upon waking up. Pt verbalized understanding.

## 2021-01-18 NOTE — Addendum Note (Signed)
Addended by: Darleen Crocker on: 01/18/2021 05:30 PM   Modules accepted: Orders

## 2021-01-19 ENCOUNTER — Other Ambulatory Visit: Payer: Self-pay

## 2021-01-19 ENCOUNTER — Ambulatory Visit: Payer: 59 | Admitting: Internal Medicine

## 2021-01-19 ENCOUNTER — Encounter: Payer: Self-pay | Admitting: Internal Medicine

## 2021-01-19 DIAGNOSIS — R42 Dizziness and giddiness: Secondary | ICD-10-CM

## 2021-01-19 NOTE — Telephone Encounter (Signed)
Pt made appt 01-19-21
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
## 2021-01-19 NOTE — Assessment & Plan Note (Signed)
Dizziness could be due to vertigo as she has worsening of symptoms with position change Also has nausea Meclizine as needed Advised to perform simple vestibular exercises at home, material provided Avoid sudden positional changes Advised to maintain adequate hydration and eat at regular intervals to avoid hypoglycemia as she is on Ozempic for weight loss Referred to ENT specialist

## 2021-01-23 ENCOUNTER — Other Ambulatory Visit: Payer: Self-pay | Admitting: Internal Medicine

## 2021-01-23 ENCOUNTER — Telehealth: Payer: Self-pay | Admitting: Neurology

## 2021-01-23 NOTE — Telephone Encounter (Signed)
PA submitted on CMM/[employer]. KEY: [id number]

## 2021-01-23 NOTE — Telephone Encounter (Signed)
PA Case: [id number], Status: Approved, Coverage Starts on: 01/23/2021 12:00 AM, Coverage Ends on: 07/23/2021 12:00 AM. Questions? Contact [phone].

## 2021-02-02 ENCOUNTER — Ambulatory Visit: Payer: 59 | Admitting: Psychiatry

## 2021-02-06 ENCOUNTER — Other Ambulatory Visit: Payer: Self-pay

## 2021-02-06 ENCOUNTER — Encounter (INDEPENDENT_AMBULATORY_CARE_PROVIDER_SITE_OTHER): Payer: Self-pay | Admitting: Family Medicine

## 2021-02-06 ENCOUNTER — Ambulatory Visit (INDEPENDENT_AMBULATORY_CARE_PROVIDER_SITE_OTHER): Payer: Self-pay | Admitting: Family Medicine

## 2021-02-06 VITALS — BP 122/81 | HR 71 | Temp 98.3°F | Ht 62.0 in | Wt 155.0 lb

## 2021-02-06 DIAGNOSIS — Z7985 Long-term (current) use of injectable non-insulin antidiabetic drugs: Secondary | ICD-10-CM

## 2021-02-06 DIAGNOSIS — E669 Obesity, unspecified: Secondary | ICD-10-CM

## 2021-02-06 DIAGNOSIS — Z6828 Body mass index (BMI) 28.0-28.9, adult: Secondary | ICD-10-CM

## 2021-02-06 DIAGNOSIS — E559 Vitamin D deficiency, unspecified: Secondary | ICD-10-CM

## 2021-02-06 DIAGNOSIS — E1165 Type 2 diabetes mellitus with hyperglycemia: Secondary | ICD-10-CM

## 2021-02-06 MED ORDER — TRULICITY 3 MG/0.5ML ~~LOC~~ SOAJ: 3.0000 mg | SUBCUTANEOUS | 0 refills | Status: DC

## 2021-02-06 NOTE — Progress Notes (Signed)
Chief Complaint:   OBESITY Brooke [name] is here to discuss her progress with her obesity treatment plan along with follow-up of her obesity related diagnoses. Brooke [name] is on the [employer] and states she is following her eating plan approximately 75% of the time. Brooke [name] states she is not currently exercising.  Today's visit was #: 68 Starting weight: 217 lbs Starting date: 09/14/2019 Today's weight: 155 lbs Today's date: 02/06/2021 Total lbs lost to date: 62 Total lbs lost since last in-office visit: 5  Interim History: Pt recognizes that she hasn't been as active in last few weeks as she would have wanted to be. She has been able to follow meal plan ~75%. Pt wants to go more vegetable products to get protein in. At the end of February, she is going to [facility].  Subjective:   1. Type 2 diabetes mellitus with hyperglycemia, without long-term current use of insulin (HCC) Pt is doing well on Trulicity. There is a drug shortage that has made getting Rx more difficult.   2. Vitamin D deficiency Pt's last Vit D level was 48.5 and she on daily multivitamin.  Assessment/Plan:   1. Type 2 diabetes mellitus with hyperglycemia, without long-term current use of insulin (HCC) Good blood sugar control is important to decrease the likelihood of diabetic complications such as nephropathy, neuropathy, limb loss, blindness, coronary artery disease, and death. Intensive lifestyle modification including diet, exercise and weight loss are the first line of treatment for diabetes.   Refill- Dulaglutide (TRULICITY) 3 FB/5.1WC SOPN; Inject 3 mg into the skin once a week.  Dispense: 2 mL; Refill: 0  2. Vitamin D deficiency Low Vitamin D level contributes to fatigue and are associated with obesity, breast, and colon cancer. She agrees to continue to take multivitamin and will follow-up for routine testing of Vitamin D, at least 2-3 times per year to avoid over-replacement. Recheck labs in 3  months.  3. Obesity with current BMI of 28.3 Brooke [name] is currently in the action stage of change. As such, her goal is to continue with weight loss efforts. She has agreed to the [employer].   Exercise goals: All adults should avoid inactivity. Some physical activity is better than none, and adults who participate in any amount of physical activity gain some health benefits.  Behavioral modification strategies: increasing lean protein intake, meal planning and cooking strategies, keeping healthy foods in the home, and planning for success.  Brooke [name] has agreed to follow-up with our clinic in 4 weeks. She was informed of the importance of frequent follow-up visits to maximize her success with intensive lifestyle modifications for her multiple health conditions.   Objective:   Blood pressure 122/81, pulse 71, temperature 98.3 F (36.8 C), height 5\' 2"  (1.575 m), weight 155 lb (70.3 kg), last menstrual period 09/01/2020, SpO2 100 %. Body mass index is 28.35 kg/m.  General: Cooperative, alert, well developed, in no acute distress. HEENT: Conjunctivae and lids unremarkable. Cardiovascular: Regular rhythm.  Lungs: Normal work of breathing. Neurologic: No focal deficits.   Lab Results  Component Value Date   CREATININE 0.78 01/11/2021   BUN 7 01/11/2021   NA 140 01/11/2021   K 5.0 01/11/2021   CL 106 01/11/2021   CO2 25 01/11/2021   Lab Results  Component Value Date   ALT 27 01/11/2021   AST 26 01/11/2021   ALKPHOS 86 01/11/2021   BILITOT 0.3 01/11/2021   Lab Results  Component Value Date   HGBA1C 5.0 01/11/2021  HGBA1C 5.3 09/22/2020   HGBA1C 5.2 08/26/2020   HGBA1C 5.1 03/24/2020   HGBA1C 5.2 09/14/2019   Lab Results  Component Value Date   INSULIN 7.9 01/11/2021   INSULIN 5.0 09/14/2020   INSULIN 5.1 03/24/2020   INSULIN 5.9 09/14/2019   Lab Results  Component Value Date   TSH 1.390 09/14/2019   Lab Results  Component Value Date   CHOL 179 01/11/2021   HDL  56 01/11/2021   LDLCALC 113 (H) 01/11/2021   TRIG 49 01/11/2021   CHOLHDL 3.0 03/24/2020   Lab Results  Component Value Date   VD25OH 48.5 01/11/2021   VD25OH 80.1 09/14/2020   VD25OH 56.4 03/24/2020   Lab Results  Component Value Date   WBC 4.4 09/22/2020   HGB 12.5 09/22/2020   HCT 38.8 09/22/2020   MCV 98.7 09/22/2020   PLT 326 09/22/2020   Lab Results  Component Value Date   IRON 111 09/03/2019   TIBC 292 09/03/2019   FERRITIN 187 09/03/2019    Attestation Statements:   Reviewed by clinician on day of visit: allergies, medications, problem list, medical history, surgical history, family history, social history, and previous encounter notes.  Coral Ceo, CMA, am acting as transcriptionist for Coralie Common, MD.   I have reviewed the above documentation for accuracy and completeness, and I agree with the above. - Coralie Common, MD

## 2021-02-07 ENCOUNTER — Ambulatory Visit (HOSPITAL_COMMUNITY)
Admission: RE | Admit: 2021-02-07 | Discharge: 2021-02-07 | Disposition: A | Discharge status: Home / Self Care | Payer: 59 | Source: Ambulatory Visit | Attending: Adult Health | Admitting: Adult Health

## 2021-02-07 DIAGNOSIS — N6312 Unspecified lump in the right breast, upper inner quadrant: Secondary | ICD-10-CM | POA: Insufficient documentation

## 2021-02-09 ENCOUNTER — Telehealth: Payer: Self-pay

## 2021-02-09 ENCOUNTER — Other Ambulatory Visit: Payer: Self-pay | Admitting: Psychiatry

## 2021-02-09 MED ORDER — DESIPRAMINE HCL 50 MG PO TABS: ORAL_TABLET | ORAL | 0 refills | Status: DC

## 2021-02-09 NOTE — Telephone Encounter (Signed)
pt left a message that she seen you in january but you did not send in the mediation you wanted her to take so she been without anything and wanted to know if you changed your mind or does she even need to come in for her appt since she hasn't been on anything.  Please call her.

## 2021-02-09 NOTE — Telephone Encounter (Signed)
Called the patient. She states that she has been leaving voice messages and this was the first time she heard back.  She discontinued venlafaxine as directed at the last visit. This Probation officer apologized that the medication was not ordered as discussed. She is willing to try desipramine at this time.  - Start desipramine 25 mg at night for one week, the 50 mg at night

## 2021-02-10 NOTE — Telephone Encounter (Signed)
pt called state that the pharmacy does not have the desipramine

## 2021-02-10 NOTE — Telephone Encounter (Signed)
Please contact the pharmacy to verify this. In epic, it stated Receipt confirmed by pharmacy

## 2021-02-12 ENCOUNTER — Other Ambulatory Visit: Payer: Self-pay | Admitting: Neurology

## 2021-02-21 ENCOUNTER — Other Ambulatory Visit: Payer: Self-pay | Admitting: Gastroenterology

## 2021-02-21 NOTE — Telephone Encounter (Signed)
Last visit over 1 year ago. Will refill 1 month and needs to schedule f/u visit for refill after that.

## 2021-02-22 ENCOUNTER — Encounter: Payer: Self-pay | Admitting: Internal Medicine

## 2021-02-22 ENCOUNTER — Other Ambulatory Visit: Payer: Self-pay

## 2021-02-22 ENCOUNTER — Ambulatory Visit
Admission: EM | Admit: 2021-02-22 | Discharge: 2021-02-22 | Disposition: A | Discharge status: Home / Self Care | Payer: 59 | Attending: Family Medicine | Admitting: Family Medicine

## 2021-02-22 ENCOUNTER — Other Ambulatory Visit (INDEPENDENT_AMBULATORY_CARE_PROVIDER_SITE_OTHER): Payer: Self-pay

## 2021-02-22 ENCOUNTER — Encounter (INDEPENDENT_AMBULATORY_CARE_PROVIDER_SITE_OTHER): Payer: Self-pay | Admitting: Family Medicine

## 2021-02-22 ENCOUNTER — Encounter: Payer: Self-pay | Admitting: Emergency Medicine

## 2021-02-22 DIAGNOSIS — B309 Viral conjunctivitis, unspecified: Secondary | ICD-10-CM

## 2021-02-22 DIAGNOSIS — J069 Acute upper respiratory infection, unspecified: Secondary | ICD-10-CM

## 2021-02-22 DIAGNOSIS — E1165 Type 2 diabetes mellitus with hyperglycemia: Secondary | ICD-10-CM

## 2021-02-22 MED ORDER — TRULICITY 3 MG/0.5ML ~~LOC~~ SOAJ: 3.0000 mg | SUBCUTANEOUS | 0 refills | Status: DC

## 2021-02-22 MED ORDER — OLOPATADINE HCL 0.2 % OP SOLN: 1.0000 [drp] | Freq: Every day | OPHTHALMIC | 0 refills | Status: DC

## 2021-02-22 NOTE — ED Triage Notes (Signed)
Pt reports left eye drainage/redness, ear pain, facial pain,headache since yesterday. Pt denies any known fevers.
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
## 2021-02-23 ENCOUNTER — Other Ambulatory Visit: Payer: Self-pay | Admitting: Internal Medicine

## 2021-02-23 DIAGNOSIS — R42 Dizziness and giddiness: Secondary | ICD-10-CM
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
## 2021-02-27 ENCOUNTER — Telehealth (INDEPENDENT_AMBULATORY_CARE_PROVIDER_SITE_OTHER): Payer: 59 | Admitting: Psychiatry

## 2021-02-27 ENCOUNTER — Other Ambulatory Visit: Payer: Self-pay

## 2021-02-27 ENCOUNTER — Encounter: Payer: Self-pay | Admitting: Psychiatry

## 2021-02-27 DIAGNOSIS — F331 Major depressive disorder, recurrent, moderate: Secondary | ICD-10-CM | POA: Diagnosis not present

## 2021-02-27 DIAGNOSIS — F410 Panic disorder [episodic paroxysmal anxiety] without agoraphobia: Secondary | ICD-10-CM | POA: Diagnosis not present

## 2021-02-27 MED ORDER — DESIPRAMINE HCL 75 MG PO TABS: 75.0000 mg | ORAL_TABLET | Freq: Every day | ORAL | 0 refills | Status: DC

## 2021-02-27 NOTE — Patient Instructions (Signed)
Increase desipramine 75 mg at night  Next appointment: 3/27 at 8 AM, video

## 2021-03-06 ENCOUNTER — Telehealth: Payer: Self-pay

## 2021-03-06 NOTE — Telephone Encounter (Signed)
I believe I sent notification to a front desk in [place] office. Please advise her to contact [place] office to have an appointment with therapist there.

## 2021-03-06 NOTE — Telephone Encounter (Signed)
pt called she states that you was suppose to send her to a therapist but she has not heard anything back from you.she like to speak with you about how she doing.  ?

## 2021-03-13 ENCOUNTER — Encounter (INDEPENDENT_AMBULATORY_CARE_PROVIDER_SITE_OTHER): Payer: Self-pay | Admitting: Family Medicine

## 2021-03-13 ENCOUNTER — Encounter: Payer: Self-pay | Admitting: Internal Medicine

## 2021-03-13 ENCOUNTER — Other Ambulatory Visit: Payer: Self-pay

## 2021-03-13 ENCOUNTER — Ambulatory Visit (INDEPENDENT_AMBULATORY_CARE_PROVIDER_SITE_OTHER): Payer: Self-pay | Admitting: Family Medicine

## 2021-03-13 VITALS — BP 103/68 | HR 78 | Temp 97.9°F | Ht 62.0 in | Wt 148.0 lb

## 2021-03-13 DIAGNOSIS — E669 Obesity, unspecified: Secondary | ICD-10-CM

## 2021-03-13 DIAGNOSIS — Z6839 Body mass index (BMI) 39.0-39.9, adult: Secondary | ICD-10-CM

## 2021-03-13 DIAGNOSIS — Z7985 Long-term (current) use of injectable non-insulin antidiabetic drugs: Secondary | ICD-10-CM

## 2021-03-13 DIAGNOSIS — F419 Anxiety disorder, unspecified: Secondary | ICD-10-CM

## 2021-03-13 DIAGNOSIS — E1165 Type 2 diabetes mellitus with hyperglycemia: Secondary | ICD-10-CM

## 2021-03-13 DIAGNOSIS — Z6827 Body mass index (BMI) 27.0-27.9, adult: Secondary | ICD-10-CM

## 2021-03-13 MED ORDER — TRULICITY 3 MG/0.5ML ~~LOC~~ SOAJ: 3.0000 mg | SUBCUTANEOUS | 0 refills | Status: DC

## 2021-03-13 NOTE — Telephone Encounter (Signed)
pt called office back pt was given information to call the [place] office to set up appt with therapist in [place].  ?

## 2021-03-13 NOTE — Telephone Encounter (Signed)
Phone visit scheduled with Brooke [name] for 03/14/21 @ 1:40  ?

## 2021-03-14 ENCOUNTER — Ambulatory Visit (INDEPENDENT_AMBULATORY_CARE_PROVIDER_SITE_OTHER): Payer: 59 | Admitting: Nurse Practitioner

## 2021-03-14 ENCOUNTER — Encounter: Payer: Self-pay | Admitting: Nurse Practitioner

## 2021-03-14 DIAGNOSIS — H9201 Otalgia, right ear: Secondary | ICD-10-CM | POA: Diagnosis not present

## 2021-03-14 MED ORDER — NEOMYCIN-POLYMYXIN-HC 3.5-10000-1 OT SOLN: 4.0000 [drp] | Freq: Four times a day (QID) | OTIC | 0 refills | Status: AC

## 2021-03-14 NOTE — Progress Notes (Signed)
? ? ? ?Chief Complaint:  ? ?OBESITY ?Brooke [name] is here to discuss her progress with her obesity treatment plan along with follow-up of her obesity related diagnoses. Brooke [name] is on the [employer] and states she is following her eating plan approximately 75% of the time. Brooke [name] states she is walking for 60 minutes 3-4 times per week, and weights for 45 minutes 2 times per week. ? ?Today's visit was #: 20 ?Starting weight: 217 lbs ?Starting date: 09/14/2019 ?Today's weight: 148 lbs ?Today's date: 03/13/2021 ?Total lbs lost to date: 65 ?Total lbs lost since last in-office visit: 7 ? ?Interim History: Brooke [name] is following the Pescatarian plan most of the time. She is experiencing some hunger, prior to lunch and sometimes before bed. Supper is mostly tuna and vegetables. No upcoming obstacles or events. She is interested in Vegan options. ? ?Subjective:  ? ?1. Type 2 diabetes mellitus with hyperglycemia, without long-term current use of insulin ([place]) ?Brooke [name] is on Trulicity 3 mg with no GI side effects. ? ?2. Anxiety ?Brooke [name] talked to [name] last week (Desipramine increased from 50 mg to 75 mg). She denies any change; she is sill having anxiety attacks. ? ?Assessment/Plan:  ? ?1. Type 2 diabetes mellitus with hyperglycemia, without long-term current use of insulin ([place]) ?Brooke [name] will continue Trulicity 3 mg once weekly, and we will refill for 1 month. ? ?- Dulaglutide (TRULICITY) 3 AS/5.0NL SOPN; Inject 3 mg into the skin once a week.  Dispense: 2 mL; Refill: 0 ? ?2. Anxiety ?Brooke [name] is to follow up with [name]. She was encouraged to seek therapy as an adjunction. ? ?3. Obesity with current BMI of 27.2 ?Brooke [name] is currently in the action stage of change. As such, her goal is to continue with weight loss efforts. She has agreed to the [employer] + 100 calories.  ? ?Exercise goals: As is, but increase weights to 3 times per week. ? ?Behavioral modification strategies: increasing lean protein intake, meal planning  and cooking strategies, keeping healthy foods in the home, and planning for success. ? ?Brooke [name] has agreed to follow-up with our clinic in 4 to 5 weeks. She was informed of the importance of frequent follow-up visits to maximize her success with intensive lifestyle modifications for her multiple health conditions.  ? ?Objective:  ? ?Blood pressure 103/68, pulse 78, temperature 97.9 ?F (36.6 ?C), height '5\' 2"'$  (1.575 m), weight 148 lb (67.1 kg), last menstrual period 09/01/2020, SpO2 100 %. ?Body mass index is 27.07 kg/m?. ? ?General: Cooperative, alert, well developed, in no acute distress. ?HEENT: Conjunctivae and lids unremarkable. ?Cardiovascular: Regular rhythm.  ?Lungs: Normal work of breathing. ?Neurologic: No focal deficits.  ? ?Lab Results  ?Component Value Date  ? CREATININE 0.78 01/11/2021  ? BUN 7 01/11/2021  ? NA 140 01/11/2021  ? K 5.0 01/11/2021  ? CL 106 01/11/2021  ? CO2 25 01/11/2021  ? ?Lab Results  ?Component Value Date  ? ALT 27 01/11/2021  ? AST 26 01/11/2021  ? ALKPHOS 86 01/11/2021  ? BILITOT 0.3 01/11/2021  ? ?Lab Results  ?Component Value Date  ? HGBA1C 5.0 01/11/2021  ? HGBA1C 5.3 09/22/2020  ? HGBA1C 5.2 08/26/2020  ? HGBA1C 5.1 03/24/2020  ? HGBA1C 5.2 09/14/2019  ? ?Lab Results  ?Component Value Date  ? INSULIN 7.9 01/11/2021  ? INSULIN 5.0 09/14/2020  ? INSULIN 5.1 03/24/2020  ? INSULIN 5.9 09/14/2019  ? ?Lab Results  ?Component Value Date  ? TSH 1.390 09/14/2019  ? ?Lab Results  ?  Component Value Date  ? CHOL 179 01/11/2021  ? HDL 56 01/11/2021  ? LDLCALC 113 (H) 01/11/2021  ? TRIG 49 01/11/2021  ? CHOLHDL 3.0 03/24/2020  ? ?Lab Results  ?Component Value Date  ? VD25OH 48.5 01/11/2021  ? VD25OH 80.1 09/14/2020  ? VD25OH 56.4 03/24/2020  ? ?Lab Results  ?Component Value Date  ? WBC 4.4 09/22/2020  ? HGB 12.5 09/22/2020  ? HCT 38.8 09/22/2020  ? MCV 98.7 09/22/2020  ? PLT 326 09/22/2020  ? ?Lab Results  ?Component Value Date  ? IRON 111 09/03/2019  ? TIBC 292 09/03/2019  ? FERRITIN 187  09/03/2019  ? ?Attestation Statements:  ? ?Reviewed by clinician on day of visit: allergies, medications, problem list, medical history, surgical history, family history, social history, and previous encounter notes. ? ? ?I, Trixie Dredge, am acting as transcriptionist for Coralie Common, MD. ? ?I have reviewed the above documentation for accuracy and completeness, and I agree with the above. Coralie Common, MD ? ? ?

## 2021-03-14 NOTE — Telephone Encounter (Signed)
sent message to [facility] to Jefferey Pica and she replied  "Once our provider reviews and accepts I will give them a call ...thank you!" ?

## 2021-03-14 NOTE — Telephone Encounter (Signed)
pt called states that she called the [place] office and they told her that she would have to get a referral from her PCP ?

## 2021-03-14 NOTE — Assessment & Plan Note (Signed)
EAR PAIN .  Had viral URI 3 weeks ago, ?Right sided ear  pain started 3 days ago. ?Cortisporin otic solution ordered, use 4 drops to right ear for 10 days. ?Patient told to schedule an in office visit to evaluate ear pain if her symptoms does not get better in a week. ?Continue Tylenol 650 mg every 6 hours as needed. ? ?

## 2021-03-14 NOTE — Progress Notes (Signed)
Virtual Visit via Telephone Note ? ?I connected with Brooke [name] on 03/14/21 at 234pm by telephone and verified that I am speaking with the correct person using two identifiers.  I spent 8 minutes on this telephone encounter. ? ?Location: ?Patient: home ?Provider: office ?  ?I discussed the limitations, risks, security and privacy concerns of performing an evaluation and management service by telephone and the availability of in person appointments. I also discussed with the patient that there may be a patient responsible charge related to this service. The patient expressed understanding and agreed to proceed. ? ? ?History of Present Illness: ?Pt with history of depression, morbid obesity, intractable chronic migraine without aura and without status migrainosus c/o right ear aching and throbbing pain that is radiating to the right neck, pain started 2 days ago. She has never had this problem before. Pt denies ear drainage. Had cough some two weeks ago. Denies fever, chills, trouble hearing, changes in her vision, memory changes, cough. she has used tylenol it helped for a little while then it started hurting again. Has chronic migraine,  ?  ?Observations/Objective: ? ?Assessment and Plan: ?EAR PAIN .  Had viral URI 3 weeks ago, ?Right sided ear  pain started 3 days ago. ?Cortisporin otic solution ordered, use 4 drops to right ear for 10 days. ?Patient told to schedule an in office visit to evaluate ear pain if her symptoms does not get better in a week. ?Continue Tylenol 650 mg every 6 hours as needed. ? ? ?Follow Up Instructions: ? ?  ?I discussed the assessment and treatment plan with the patient. The patient was provided an opportunity to ask questions and all were answered. The patient agreed with the plan and demonstrated an understanding of the instructions. ?  ?The patient was advised to call back or seek an in-person evaluation if the symptoms worsen or if the condition fails to improve as anticipated.   ?

## 2021-03-24 ENCOUNTER — Telehealth: Payer: Self-pay

## 2021-03-24 NOTE — Telephone Encounter (Signed)
pt called states that she just feels tired and worn out all the time she has no energy she feels like the medication you have her on is doing it.  pleae call her when you have time.  ?

## 2021-03-24 NOTE — Telephone Encounter (Signed)
Called twice this morning. Left voice message to contact the office.

## 2021-03-27 ENCOUNTER — Encounter: Payer: Self-pay | Admitting: Psychiatry

## 2021-03-27 ENCOUNTER — Telehealth (INDEPENDENT_AMBULATORY_CARE_PROVIDER_SITE_OTHER): Payer: 59 | Admitting: Psychiatry

## 2021-03-27 ENCOUNTER — Other Ambulatory Visit: Payer: Self-pay

## 2021-03-27 DIAGNOSIS — F331 Major depressive disorder, recurrent, moderate: Secondary | ICD-10-CM | POA: Diagnosis not present

## 2021-03-27 DIAGNOSIS — F410 Panic disorder [episodic paroxysmal anxiety] without agoraphobia: Secondary | ICD-10-CM | POA: Diagnosis not present

## 2021-03-27 MED ORDER — VILAZODONE HCL 10 MG PO TABS: ORAL_TABLET | ORAL | 1 refills | Status: DC

## 2021-03-27 NOTE — Patient Instructions (Signed)
Start Vilazodone 5 mg daily for one week, then 10 mg daily  ?Hold desipramine  ?Next appointment: 5/3 at 8:20 ?
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
## 2021-03-27 NOTE — Telephone Encounter (Signed)
Closing notes pt has not return message ? ?

## 2021-03-28 ENCOUNTER — Encounter: Payer: Self-pay | Admitting: Adult Health

## 2021-03-28 ENCOUNTER — Ambulatory Visit: Payer: 59 | Admitting: Adult Health

## 2021-03-28 VITALS — BP 120/85 | HR 88 | Ht 62.0 in | Wt 148.6 lb

## 2021-03-28 DIAGNOSIS — G43009 Migraine without aura, not intractable, without status migrainosus: Secondary | ICD-10-CM | POA: Diagnosis not present

## 2021-03-28 DIAGNOSIS — M542 Cervicalgia: Secondary | ICD-10-CM | POA: Diagnosis not present

## 2021-03-28 MED ORDER — CYCLOBENZAPRINE HCL 5 MG PO TABS: 5.0000 mg | ORAL_TABLET | Freq: Every day | ORAL | 0 refills | Status: DC

## 2021-03-28 NOTE — Progress Notes (Signed)
? ? ?PATIENT: Brooke [name] ?DOB: [birth year] ? ?REASON FOR VISIT: follow up ?HISTORY FROM: patient ?PRIMARY NEUROLOGIST: Dr. Brett Fairy ? ?Chief Complaint from RN/CMA:Follow-up (Pt in 20 pt is here for migraine follow up  Pt states she had a migraine this weekend and she took tylenol,Imitrex, and a oxycodone . Pt states she finally felt relief Sunday.  Pt states she has ear pain and travels to her neck and  her neck is stiff. Pt states she is fatigue all the time ) ? ? ? ?HISTORY OF PRESENT ILLNESS: ?Today 03/28/21 ? ?Brooke [name] is a 51 year old female with history of migraine headaches and nonepileptic seizure events. ? ?At the last visit she was started on Aimovig and Topamax was reduced to 100 mg BID. She actually stopped Topamax due to side effects. She was started on Nurtec for abortive therapy but caused her to be nauseous. On imitrex from PCP.On 01/18/2021 patient called in with worsening headaches and Propranolol started 60 mg at bedtime. ? ?Today, she reports that she hasn't had a migraine in several months until Saturday she had a Migraine- took Imitrex no relief, took oxycodone with no relief. Repeated Oxycodone at bedtime with no relief. Sunday the headache eventually went away. Prior to the headache she reports that she was having generalized weakness for about one week. ? ?She reports that she always feels full in her right ear with pain radiating down the neck. Nothing relieves. 7-8 on the pain scale. Phonophobia is present. This is occurring daily for the last month. She does report having seasonal allergies denies any symptoms today.  She did have an MRI of the cervical spine ordered by neurosurgery that did show: ? ?IMPRESSION: ?1. C6-C7 mild spinal canal stenosis, with moderate to severe left ?and mild right neural foraminal narrowing. ?2. C4-C5 and C5-C6 mild spinal canal stenosis and moderate bilateral ?neural foraminal narrowing. ?3. C3-C4 mild to moderate left neural foraminal narrowing. ? ?She  reports that she went to follow-up with neurosurgery but her insurance was no longer covered there and they did not review any imaging with her.  She reports that she was initially sent there for ongoing neck pain. ? ?She reports that Aimovig is due next week.  ? ? ?HISTORY (copied from Dr. Brett Fairy note) ?Brooke [name] is a 51 y.o. female , was seen here originally in a referral from NP Pearline Cables for spells and headaches. She is now re-referred for headaches, on 11-22-2020.  ?She lost her brother last Sunday, after a long and severe illness with bladder cancer  ?She had a partial hysterectomy in September. She had perimenopausal symptoms. Still has nocturnal hot flushes. Is jittery, constantly moving. Appears not under stress.  ?Had tested positive for flu on 11-16-2020 but has recovered.  ?Migraines are coming on in morning, before lunch but are not present when she wakes up.  Photophobia, nausea, not vomiting.  always on the right side of her head, temple, ear hurts, no tearing , no flashing, but vision is blurred-  ?EVERY DAY for the last 2 months, pain is 6 out of 10. ?Every day aware but not every day impaired. She is on Topamax and feels her word finding is decreased, memory impaired. Imitrex has not given relief.  ?  ?Not testing for memory here today, but discussing alternatives for prevention of vascular headaches.  OTC  med use, tylenol, daily. Biotin daily.  ?She is status post gastric bypass- take NSAIDS.  ?  ?  ?  ?  ?  ?  ?  CC; 11-22-2020 3rd time referral for this patient - this time : Internal referral for migraines. Has hx chronic migraines. Daily migraine started 2-3 months ago. As he day goes on, worsens. Brother passed this past Sunday from cancer, long time ill- . Has pain on right side and radiates down half her neck. Gets blurry vision, nausea.  ?  ?  ?The patient is seen in a RV, we left after the last visit " as needed': ?Today 20 August 2019 and Brooke [name] who is a meanwhile 51 year old  African-American right-handed female patient is presenting with a new concern she states that she is unsure what happened but during the last week she woke up in the morning  and had wet her bed.  She felt sluggish upon waking up she does not remember having vivid dreams, any other physical activity in the day preceding that night was not unusual in any way.  She also stated she is not taking diuretic medication, she does not drink alcohol or an excessive amount of liquid.  Her diet that day was not excessively salty. She is not taking sleep aids only hydrazine.  She has been under a lot of stress lately and so has still frequent alterations of awareness.  She underwent a 72-hour ambulatory EEG under the guidance of Dr. Delice Lesch which noted multiple spells that the patient herself marked but all of them without EEG changes.  At this time we operate under the assumption that her spells are nonepileptic. ?There were no electrographic seizures seen.  EKG lead was unremarkable. ?  ?IMPRESSION: This 72-hour ambulatory video EEG study is normal.   ?  ?CLINICAL CORRELATION: Numerous episodes of chest tightness, nausea, dizziness, left arm tingling did not show electrographic correlate. She reported one episode of staring with no EEG change. Episodes captured were non-epileptic. If further clinical questions remain, inpatient video EEG monitoring may be helpful. ?Brooke [name], M.D. ?She reports feeling benefits form her anxiety treatment with behavior health.  ?She attends a visit 3 month. Effexor prescribed.  She continued to take topiramate 100 mg twice daily which is a mild diuretic as a carbohydrate inhibitor that was prescribed to reduce her headaches for which it has worked.  She also takes levetiracetam known as Keppra 250 mg 1 tablet twice a day.  This is filled by Dr. Dagoberto Ligas.  ?She remains sleepier than the average person, endorsing the Epworth score at 14 points.  She sleeps 3 hours in her living room while the TV  is running ( she likes the soundscape)  and has one hour wakefulness period,followed by another 3-4 hours of sleep in the bedroom.  ?She got gastric bypass surgery and g regained all her weight back became a stress eater( or always was) . Her husband had open heart surgery just this July- just the week before the enuresis spell. 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 Both her parents are incarcerated.  ?  ?LOTS OF STRESS.  ?  ?  ?I had last seen this patient 06-2017 and left it for PRN Rv - as needed.  ?The patient had reported a spell in 2018, and several in the interval- the last one just last night.  She had complaint about her head hurting and her husband stated she ' went out and was not responding to him anymore for 10-15 minutes - not waxing and not waning. Her husband uses the terms ' seizures ' and reports  she when  is coming to and will ' talk out of her mind" , vocalization.  ?On  July 4th- she felt diaphoretic and it was very hot- started feeling funny.  Somehow felt as if choking - she had a stressfull day and had slept poorly the night before. She drank sweet iced tea- caffeine use is her home remedy.  ?EMS was called, and she was still unable to answer questions. This was a total of 15 minutes for which she was waxing and waning. She was taken to hospital.  ?Brooke [name] was [facility] to [facility] July.  A urine clean catch strep been done showing some haziness of the urine rare bacteria few red blood cells but 20-50 white blood cells.  She did have a normal CBC with differential neither lymphocytes anemia nor leukopenia, comprehensive metabolic panel showed a low CO2 BUN of 21 which is a slight stage of dehydration creatinine was 1.36, calcium 8.6, glomerular filtration rate 53.  Head CT was obtained for the reason of altered level of consciousness.  Without contrast as a stroke was not suspected, it was compared to a head CT CT and brain MRI  from 09-24-2016 when she had for the first time the spell.  Interestingly paraspinals mastoid air cells were all clear no evidence of any infarct no evidence of stroke no hydrocephalus no tumor.  The patient

## 2021-03-29 ENCOUNTER — Encounter: Payer: Self-pay | Admitting: Emergency Medicine

## 2021-03-29 ENCOUNTER — Encounter: Payer: Self-pay | Admitting: Internal Medicine

## 2021-03-29 ENCOUNTER — Telehealth: Payer: Self-pay | Admitting: *Deleted

## 2021-03-29 ENCOUNTER — Ambulatory Visit
Admission: EM | Admit: 2021-03-29 | Discharge: 2021-03-29 | Disposition: A | Discharge status: Home / Self Care | Payer: 59 | Attending: Family Medicine | Admitting: Family Medicine

## 2021-03-29 ENCOUNTER — Other Ambulatory Visit: Payer: Self-pay | Admitting: Family Medicine

## 2021-03-29 ENCOUNTER — Other Ambulatory Visit: Payer: Self-pay

## 2021-03-29 DIAGNOSIS — H6981 Other specified disorders of Eustachian tube, right ear: Secondary | ICD-10-CM | POA: Diagnosis not present

## 2021-03-29 DIAGNOSIS — J069 Acute upper respiratory infection, unspecified: Secondary | ICD-10-CM | POA: Diagnosis not present

## 2021-03-29 DIAGNOSIS — Z20828 Contact with and (suspected) exposure to other viral communicable diseases: Secondary | ICD-10-CM

## 2021-03-29 MED ORDER — FLUTICASONE PROPIONATE 50 MCG/ACT NA SUSP: 1.0000 | Freq: Two times a day (BID) | NASAL | 0 refills | Status: DC

## 2021-03-29 MED ORDER — PROMETHAZINE-DM 6.25-15 MG/5ML PO SYRP: 5.0000 mL | ORAL_SOLUTION | Freq: Four times a day (QID) | ORAL | 0 refills | Status: DC | PRN

## 2021-03-29 MED ORDER — DEXAMETHASONE SODIUM PHOSPHATE 10 MG/ML IJ SOLN
10.0000 mg | Freq: Once | INTRAMUSCULAR | Status: AC
  Administered 2021-03-29: 10 mg via INTRAMUSCULAR

## 2021-03-29 NOTE — Telephone Encounter (Signed)
Received approval letter from [employer] for Pantoprazole '40mg'$ . Sent letter to scan center.  ?

## 2021-03-29 NOTE — ED Provider Notes (Signed)
?[facility] ? ? ? ?CSN: [id number] ?Arrival date & time: 03/29/21  1048 ? ? ?  ? ?History   ?Chief Complaint ?No chief complaint on file. ? ?HPI ?TANAYSIA BHARDWAJ is a 51 y.o. female.  ? ?Presenting today with about a week of fatigue, weakness and now 3-day history of sore throat, right ear pain, chills, mild cough, headache, nausea.  Denies chest pain, shortness of breath, abdominal pain, vomiting, diarrhea.  Trying Tylenol with mild relief of headache, otherwise not taking anything for symptoms.  History of seasonal allergies not currently on allergy regimen.  No known sick contacts recently. ? ? ?Past Medical History:  ?Diagnosis Date  ? Acid reflux   ? Amenorrhea 02/06/2012  ? Anxiety   ? Arthritis   ? [name] 10/13/2019  ? B12 deficiency   ? Breast lump 08/06/2019  ? Cervical radiculitis   ? Chest pain   ? Chronic constipation   ? DDD (degenerative disc disease), lumbar   ? Depression   ? Depression   ? [name] 10/13/2019  ? Dizzy spells   ? Elevated vitamin B12 level 05/04/2019  ? Esophageal dysphagia 11/19/2012  ? Facial numbness   ? Family history of systemic lupus erythematosus 10/22/2019  ? Fatty liver   ? GERD (gastroesophageal reflux disease)   ? [name] 10/13/2019  ? Headache(784.0) 04/01/2012  ? Heart palpitations 01/2017  ? High cholesterol   ? History of anemia   ? History of hiatal hernia   ? Hypersomnia due to another medical condition 06/12/2017  ? Hypertension   ? Insomnia   ? Intractable migraine with visual aura and without status migrainosus 01/23/2017  ? Iron deficiency anemia   ? Irregular periods 08/06/2019  ? Joint pain   ? Lactose intolerance   ? LUQ pain 11/19/2012  ? Menopausal symptom 08/06/2019  ? Migraines   ? occ  ? Near syncope 01/2017  ? Numbness and tingling 10/08/2016  ? Formatting of this note might be different from the original. ---Oct 2018-TEE----Normal left ventricular size and systolic function with no appreciable segmental abnormality. EF 60% There was no  evidence of spontaneous echo contrast or thrombus in the left atrium or left atrial appendage. No significant valvular abnormalites noted Bubble study performed, this is negative.  ? Numbness and tingling in left arm   ? Obesity   ? Other malaise and fatigue 05/19/2012  ? Panic attacks   ? Raynaud's phenomenon without gangrene 10/22/2019  ? Sciatica   ? Seizures ([place])   ? from [place]. last seizure was 4 months ago  ? Slurred speech 11/07/2016  ? Formatting of this note might be different from the original. ---Oct 2018-TEE----Normal left ventricular size and systolic function with no appreciable segmental abnormality. EF 60% There was no evidence of spontaneous echo contrast or thrombus in the left atrium or left atrial appendage. No significant valvular abnormalites noted Bubble study performed, this is negative.  ? Small bowel obstruction ([place]) 07/20/2017  ? Spells of speech arrest 01/23/2017  ? Transient cerebral ischemia 10/08/2016  ? Formatting of this note might be different from the original. ---Oct 2018-TEE----Normal left ventricular size and systolic function with no appreciable segmental abnormality. EF 60% There was no evidence of spontaneous echo contrast or thrombus in the left atrium or left atrial appendage. No significant valvular abnormalites noted Bubble study performed, this is negative.  ? Vitamin D deficiency   ? Word finding difficulty 01/23/2017  ? ? ?Patient Active Problem List  ? Diagnosis  Date Noted  ? Right ear pain 03/14/2021  ? Status post bariatric surgery 11/22/2020  ? Cervical radiculopathy 10/13/2020  ? Abnormal uterine bleeding 09/27/2020  ? Cervical pain (neck) 09/16/2020  ? Encounter for examination following treatment at hospital 09/16/2020  ? Insulin resistance 09/14/2020  ? Near syncope 09/07/2020  ? Left shoulder pain 02/22/2020  ? Environmental and seasonal allergies 10/14/2019  ? Generalized anxiety disorder with panic attacks 08/20/2019  ? Arthritis 08/06/2019  ? Vitamin D  deficiency 05/04/2019  ? Mixed obsessional thoughts and acts 12/12/2018  ? Seizure disorder ([place]) 10/13/2018  ? 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 07/16/2017  ? Class 2 severe obesity with serious comorbidity and body mass index (BMI) of 39.0 to 39.9 in adult [facility]) 01/23/2017  ? Vertigo 10/08/2016  ? Intractable chronic migraine without aura and without status migrainosus 10/08/2016  ? Seizures ([place]) 09/25/2016  ? Morbid obesity ([place]) 12/03/2012  ? Depression, major, single episode, moderate ([place]) 02/06/2012  ? ? ?Past Surgical History:  ?Procedure Laterality Date  ? BUNIONECTOMY Left yrs ago  ? CERVICAL ABLATION  2017  ? COLONOSCOPY, ESOPHAGOGASTRODUODENOSCOPY (EGD) AND ESOPHAGEAL DILATION N/A 12/03/2012  ? RXV:QMGQQPYP melanosis throughout the entire examined colon/The colon IS redundant/Small internal hemorrhoids/EGD:Esophageal web/Medium sized hiatal hernia/MILD Non-erosive gastritis  ? ESOPHAGOGASTRODUODENOSCOPY  03/09/2009  ? Dr. Wilford Corner, normal EGD, s/p Bravo capsule placement  ? ESOPHAGOGASTRODUODENOSCOPY N/A 06/24/2019  ? rourk: Status post gastric bypass procedure, normal esophagus status post dilation  ? EYE SURGERY N/A   ? [name] 10/13/2019  ? GASTRIC ROUX-EN-Y N/A 07/16/2017  ? Procedure: LAPAROSCOPIC ROUX-EN-Y GASTRIC BYPASS WITH UPPER ENDOSCOPY AND ERAS PATHWAY;  Surgeon: Johnathan Hausen, MD;  Location: [facility];  Service: General;  Laterality: N/A;  ? HYSTERECTOMY ABDOMINAL WITH SALPINGECTOMY Bilateral 09/27/2020  ? Procedure: MINI LAP HYSTERECTOMY ABDOMINAL WITH BILATERAL SALPINGECTOMY;  Surgeon: Janyth Pupa, DO;  Location: [facility];  Service: Gynecology;  Laterality: Bilateral;  ? LAPAROSCOPY N/A 07/20/2017  ? Procedure: LAPAROSCOPY DIAGNOSTIC. REDUCTION OF SMALL BOWEL OBSTRUCTION. REPAIR OF TROCAR HERNIA.;  Surgeon: Alphonsa Overall, MD;  Location: [facility];  Service: General;  Laterality: N/A;  ? MALONEY DILATION N/A 06/24/2019  ? Procedure: MALONEY DILATION;  Surgeon: Daneil Dolin,  MD;  Location: [facility];  Service: Endoscopy;  Laterality: N/A;  ? TUBAL LIGATION    ? WISDOM TOOTH EXTRACTION    ? ? ?OB History   ? ? Gravida  ?3  ? Para  ?   ? Term  ?   ? Preterm  ?   ? AB  ?1  ? Living  ?2  ?  ? ? SAB  ?   ? IAB  ?   ? Ectopic  ?   ? Multiple  ?   ? Live Births  ?2  ?   ?  ?  ? ? ? ?Home Medications   ? ?Prior to Admission medications   ?Medication Sig Start Date End Date Taking? Authorizing Provider  ?fluticasone (FLONASE) 50 MCG/ACT nasal spray Place 1 spray into both nostrils 2 (two) times daily. 03/29/21  Yes Volney American, PA-C  ?promethazine-dextromethorphan (PROMETHAZINE-DM) 6.25-15 MG/5ML syrup Take 5 mLs by mouth 4 (four) times daily as needed. 03/29/21  Yes Volney American, PA-C  ?acetaminophen (TYLENOL) 500 MG tablet Take 1,000 mg by mouth daily as needed for moderate pain or headache.    [provider]  ?Cyanocobalamin (B-12 SUPER STRENGTH) 5000 MCG/ML LIQD Place under the tongue.    [provider]  ?cyclobenzaprine (FLEXERIL) 5 MG tablet Take 1 tablet (5 mg total) by mouth at bedtime. 03/28/21   Ward Givens, NP  ?diclofenac Sodium (VOLTAREN) 1 % GEL Apply 1 g topically as needed for pain. 06/06/19   [provider]  ?Dulaglutide (TRULICITY) 3 JJ/0.0XF SOPN Inject 3 mg into the skin once a week. 03/13/21   Laqueta Linden, MD  ?Eduard Roux (AIMOVIG) 140 MG/ML SOAJ Inject 140 mg into the skin every 30 (thirty) days. 11/22/20   Dohmeier, Asencion Partridge, MD  ?fluticasone (FLONASE) 50 MCG/ACT nasal spray Place 2 sprays into both nostrils daily. ?Patient taking differently: Place 2 sprays into both nostrils. 10/14/19   Perlie Mayo, NP  ?levETIRAcetam (KEPPRA) 250 MG tablet TAKE 1 TABLET(250 MG) BY MOUTH AT BEDTIME 01/23/21   Lindell Spar, MD  ?levocetirizine (XYZAL) 5 MG tablet Take 1 tablet (5 mg total) by mouth every evening. ?Patient taking differently: Take 5 mg by mouth. 10/14/19   Perlie Mayo, NP  ?meclizine (ANTIVERT) 25 MG  tablet TAKE 1 TABLET(25 MG) BY MOUTH THREE TIMES DAILY AS NEEDED FOR DIZZINESS ?Patient taking differently: as needed. 02/23/21   Lindell Spar, MD  ?Multiple Vitamins-Minerals (BARIATRIC MULTIVITAMINS/IRON

## 2021-03-29 NOTE — ED Triage Notes (Signed)
Sore throat, right ear pain, feels weak with cold chills x 1 week. Also c/o of a dull headache. Also feels nauseated. ?

## 2021-03-31 LAB — COVID-19, FLU A+B NAA
Influenza A, NAA: NOT DETECTED
Influenza B, NAA: NOT DETECTED
SARS-CoV-2, NAA: NOT DETECTED

## 2021-04-08 ENCOUNTER — Other Ambulatory Visit: Payer: Self-pay | Admitting: Internal Medicine

## 2021-04-08 DIAGNOSIS — R42 Dizziness and giddiness: Secondary | ICD-10-CM

## 2021-04-12 DIAGNOSIS — Z0271 Encounter for disability determination: Secondary | ICD-10-CM

## 2021-04-17 ENCOUNTER — Encounter (INDEPENDENT_AMBULATORY_CARE_PROVIDER_SITE_OTHER): Payer: Self-pay | Admitting: Family Medicine

## 2021-04-17 ENCOUNTER — Ambulatory Visit (INDEPENDENT_AMBULATORY_CARE_PROVIDER_SITE_OTHER): Payer: 59 | Admitting: Family Medicine

## 2021-04-17 VITALS — BP 115/76 | HR 65 | Temp 98.0°F | Ht 62.0 in | Wt 147.0 lb

## 2021-04-17 DIAGNOSIS — E669 Obesity, unspecified: Secondary | ICD-10-CM

## 2021-04-17 DIAGNOSIS — F3289 Other specified depressive episodes: Secondary | ICD-10-CM | POA: Diagnosis not present

## 2021-04-17 DIAGNOSIS — Z7985 Long-term (current) use of injectable non-insulin antidiabetic drugs: Secondary | ICD-10-CM

## 2021-04-17 DIAGNOSIS — Z6827 Body mass index (BMI) 27.0-27.9, adult: Secondary | ICD-10-CM | POA: Diagnosis not present

## 2021-04-17 DIAGNOSIS — E1165 Type 2 diabetes mellitus with hyperglycemia: Secondary | ICD-10-CM

## 2021-04-17 MED ORDER — TRULICITY 3 MG/0.5ML ~~LOC~~ SOAJ: 3.0000 mg | SUBCUTANEOUS | 0 refills | Status: DC

## 2021-04-19 ENCOUNTER — Ambulatory Visit (INDEPENDENT_AMBULATORY_CARE_PROVIDER_SITE_OTHER): Payer: Self-pay | Admitting: Psychiatry

## 2021-04-19 ENCOUNTER — Encounter (HOSPITAL_COMMUNITY): Payer: Self-pay | Admitting: Psychiatry

## 2021-04-19 ENCOUNTER — Encounter (HOSPITAL_COMMUNITY): Payer: Self-pay

## 2021-04-19 DIAGNOSIS — F331 Major depressive disorder, recurrent, moderate: Secondary | ICD-10-CM

## 2021-04-19 DIAGNOSIS — F411 Generalized anxiety disorder: Secondary | ICD-10-CM

## 2021-04-19 NOTE — Plan of Care (Signed)
This is the initial treatment plan. Pt participated in development of plan.  ?Problem: Anxiety Disorder CCP worry episodes  ?Goal: I want to stop worrying so much AEB reducing worry episodes to 1 x per week consistently for 60 days per pt's self report  ?Outcome: Not Progressing ?Goal: LTG: Patient will score less than 5 on the Generalized Anxiety Disorder 7 Scale (GAD-7) ?Outcome: Not Progressing ?Goal: STG: Patient will practice problem solving skills 3 times per week for the next 4 weeks ?Outcome: Not Progressing ?  ?
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
## 2021-04-27 ENCOUNTER — Ambulatory Visit: Payer: Self-pay

## 2021-04-27 ENCOUNTER — Ambulatory Visit (INDEPENDENT_AMBULATORY_CARE_PROVIDER_SITE_OTHER): Payer: 59

## 2021-04-27 ENCOUNTER — Ambulatory Visit: Payer: 59 | Admitting: Physician Assistant

## 2021-04-27 DIAGNOSIS — G8929 Other chronic pain: Secondary | ICD-10-CM

## 2021-04-27 DIAGNOSIS — M25512 Pain in left shoulder: Secondary | ICD-10-CM

## 2021-04-27 DIAGNOSIS — M7502 Adhesive capsulitis of left shoulder: Secondary | ICD-10-CM | POA: Diagnosis not present

## 2021-04-27 MED ORDER — METHYLPREDNISOLONE ACETATE 40 MG/ML IJ SUSP
80.0000 mg | INTRAMUSCULAR | Status: AC | PRN
  Administered 2021-04-27: 80 mg via INTRA_ARTICULAR

## 2021-04-27 MED ORDER — BUPIVACAINE HCL 0.25 % IJ SOLN
2.0000 mL | INTRAMUSCULAR | Status: AC | PRN
  Administered 2021-04-27: 2 mL via INTRA_ARTICULAR

## 2021-04-27 MED ORDER — LIDOCAINE HCL 1 % IJ SOLN
2.0000 mL | INTRAMUSCULAR | Status: AC | PRN
  Administered 2021-04-27: 2 mL

## 2021-04-27 NOTE — Progress Notes (Signed)
? ? ? ?Chief Complaint:  ? ?OBESITY ?Brooke [name] is here to discuss her progress with her obesity treatment plan along with follow-up of her obesity related diagnoses. Brooke [name] is on the [place] plus 100 calories and states she is following her eating plan approximately 50% of the time. Brooke [name] states she is walking for 60 minutes 1 times per week. ? ?Today's visit was #: 21 ?Starting weight: 217 lbs ?Starting date: 09/14/2019 ?Today's weight: 147 lbs ?Today's date: 04/17/2021 ?Total lbs lost to date: 70 lbs ?Total lbs lost since last in-office visit: 1 lb ? ?Interim History: Brooke [name] has been more hungry recently. She is wondering if it's related to [place]. She notes increased sweets cravings. She hasn't exercised as much. She is following Pescatarian with more [place] recently. She feels satisfied at the end of a meal but a few hours later gets a snack.  ? ?Subjective:  ? ?1. Type 2 diabetes mellitus with hyperglycemia, without long-term current use of insulin ([place]) ?Brooke [name] is currently on Trulicity 3 mg. She denies gastrointestinal issues or side effects. She shows good satiety. ? ?2. Other depression ?Brooke [name] is on Viibryd now 10 mg. She denies suicidal ideas and homicidal ideas. Her mood has been okay.  ? ?Assessment/Plan:  ? ?1. Type 2 diabetes mellitus with hyperglycemia, without long-term current use of insulin ([place]) ?Annita will continue Trulicity and we will refill Trulicity 3 mg for 1 month with no refills. Good blood sugar control is important to decrease the likelihood of diabetic complications such as nephropathy, neuropathy, limb loss, blindness, coronary artery disease, and death. Intensive lifestyle modification including diet, exercise and weight loss are the first line of treatment for diabetes.  ? ?- Dulaglutide (TRULICITY) 3 FS/2.3TR SOPN; Inject 3 mg into the skin once a week.  Dispense: 2 mL; Refill: 0 ? ?2. Other depression ?Brooke [name] will continue Viibryd per phycologist. She did show [facility] page for medication cost. Behavior modification techniques were discussed today to help Brooke [name] deal with her emotional/non-hunger eating behaviors.  Orders and follow up as documented in patient record.  ? ?3. Obesity with current BMI of 27.0 ?Brooke [name] is currently in the action stage of change. As such, her goal is to continue with weight loss efforts. She has agreed to the [employer] plus 100 calories.  ? ?Exercise goals: All adults should avoid inactivity. Some physical activity is better than none, and adults who participate in any amount of physical activity gain some health benefits. She will start resistance training encourage.  ? ?Behavioral modification strategies: increasing lean protein intake, meal planning and cooking strategies, and keeping healthy foods in the home. ? ?Brooke [name] has agreed to follow-up with our clinic in 4-5 weeks. She was informed of the importance of frequent follow-up visits to maximize her success with intensive lifestyle modifications for her multiple health conditions.  ? ?Objective:  ? ?Blood pressure 115/76, pulse 65, temperature 98 ?F (36.7 ?C), height '5\' 2"'$  (1.575 m), weight 147 lb (66.7 kg), last menstrual period 09/01/2020, SpO2 100 %. ?Body mass index is 26.89 kg/m?. ? ?General: Cooperative, alert, well developed, in no acute distress. ?HEENT: Conjunctivae and lids unremarkable. ?Cardiovascular: Regular rhythm.  ?Lungs: Normal work of breathing. ?Neurologic: No focal deficits.  ? ?Lab Results  ?Component Value Date  ? CREATININE 0.78 01/11/2021  ? BUN 7 01/11/2021  ? NA 140 01/11/2021  ? K 5.0 01/11/2021  ? CL 106 01/11/2021  ? CO2 25 01/11/2021  ? ?Lab Results  ?Component Value Date  ?  ALT 27 01/11/2021  ? AST 26 01/11/2021  ? ALKPHOS 86 01/11/2021  ? BILITOT 0.3 01/11/2021  ? ?Lab Results  ?Component Value Date  ? HGBA1C 5.0 01/11/2021  ? HGBA1C 5.3 09/22/2020  ? HGBA1C 5.2 08/26/2020  ? HGBA1C 5.1 03/24/2020  ? HGBA1C 5.2 09/14/2019  ? ?Lab Results   ?Component Value Date  ? INSULIN 7.9 01/11/2021  ? INSULIN 5.0 09/14/2020  ? INSULIN 5.1 03/24/2020  ? INSULIN 5.9 09/14/2019  ? ?Lab Results  ?Component Value Date  ? TSH 1.390 09/14/2019  ? ?Lab Results  ?Component Value Date  ? CHOL 179 01/11/2021  ? HDL 56 01/11/2021  ? LDLCALC 113 (H) 01/11/2021  ? TRIG 49 01/11/2021  ? CHOLHDL 3.0 03/24/2020  ? ?Lab Results  ?Component Value Date  ? VD25OH 48.5 01/11/2021  ? VD25OH 80.1 09/14/2020  ? VD25OH 56.4 03/24/2020  ? ?Lab Results  ?Component Value Date  ? WBC 4.4 09/22/2020  ? HGB 12.5 09/22/2020  ? HCT 38.8 09/22/2020  ? MCV 98.7 09/22/2020  ? PLT 326 09/22/2020  ? ?Lab Results  ?Component Value Date  ? IRON 111 09/03/2019  ? TIBC 292 09/03/2019  ? FERRITIN 187 09/03/2019  ? ?Attestation Statements:  ? ?Reviewed by clinician on day of visit: allergies, medications, problem list, medical history, surgical history, family history, social history, and previous encounter notes. ? ?I, Lizbeth Bark, RMA, am acting as transcriptionist for Coralie Common, MD.  ? ?I have reviewed the above documentation for accuracy and completeness, and I agree with the above. Coralie Common, MD ? ?

## 2021-04-27 NOTE — Progress Notes (Signed)
? ?Office Visit Note ?  ?Patient: Brooke [name]           ?Date of Birth: December 06, 1970           ?MRN: [id number] ?Visit Date: 04/27/2021 ?             ?Requested by: Lindell Spar, MD ?[address] ?[place],  [place] [address] ?PCP: Lindell Spar, MD ? ?Chief Complaint  ?Patient presents with  ? Left Shoulder - Pain  ? ? ? ? ?HPI: ?Brooke [name] is a pleasant 51 year old woman with a 1 year history of left shoulder pain and stiffness.  She said this occurred after a fall and she tried to brace herself with her left arm.  It is progressively gotten worse.  She previously was a diabetic but no longer is.  She has a normal A1c and has had for a while.  She has tried taking Tylenol with no improvement in her symptoms. ? ?Assessment & Plan: ?Visit Diagnoses:  ?1. Chronic left shoulder pain   ?2. Adhesive capsulitis of left shoulder   ? ? ?Plan: Findings consistent with adhesive capsulitis of her left shoulder.  Possible rotator cuff tear.  Initial treatment will be an injection today.  I will also place an order for physical therapy at [facility] as she lives in [place].  I would like her to do 1 month of therapy I have also showed her some stretching exercises to do until therapy begins.  After that she will follow-up with Dr. Durward [name].  If her exam does not improve or she still has continued pain pain I would recommend an MRI ? ?Follow-Up Instructions: No follow-ups on file.  ? ?Ortho Exam ? ?Patient is alert, oriented, no adenopathy, well-dressed, normal affect, normal respiratory effort. ?Examination of her left shoulder.  She has active forward elevation to about 110.  I can passively elevate her slightly more.  She has limited internal rotation behind her back.  She has significant pain with empty can testing.  Not much pain with speeds testing.  Distal circulation is intact.  Strength is slightly compromised secondary to pain no redness about the shoulder ? ?Imaging: ?XR Shoulder Left ? ?Result Date:  04/27/2021 ?Radiographs of her left shoulder were reviewed today in multiple projections.  No abnormalities in the soft tissues.  Her humeral head is well reduced in the glenoid.  She has no sclerotic changes she has good joint spacing.  No fractures noted.  No significant arthritis  ?No images are attached to the encounter. ? ?Labs: ?Lab Results  ?Component Value Date  ? HGBA1C 5.0 01/11/2021  ? HGBA1C 5.3 09/22/2020  ? HGBA1C 5.2 08/26/2020  ? ESRSEDRATE 5 10/22/2019  ? CRP <1 10/22/2019  ? REPTSTATUS 08/07/2019 FINAL 08/06/2019  ? CULT  08/06/2019  ?  NO GROWTH ?Performed at [facility], [place] [address] [place], [place] [address] ?  ? ? ? ?Lab Results  ?Component Value Date  ? ALBUMIN 4.0 01/11/2021  ? ALBUMIN 3.8 08/26/2020  ? ALBUMIN 4.0 03/24/2020  ? ? ?Lab Results  ?Component Value Date  ? MG 1.8 10/15/2018  ? MG 2.2 10/13/2018  ? ?Lab Results  ?Component Value Date  ? VD25OH 48.5 01/11/2021  ? VD25OH 80.1 09/14/2020  ? VD25OH 56.4 03/24/2020  ? ? ?No results found for: PREALBUMIN ? ?  Latest Ref Rng & Units 09/22/2020  ?  8:25 AM 09/07/2020  ? 12:47 PM 08/26/2020  ? 10:21 AM  ?  CBC EXTENDED  ?WBC 4.0 - 10.5 K/uL 4.4   4.7   4.8    ?RBC 3.87 - 5.11 MIL/uL 3.93   3.89   4.03    ?Hemoglobin 12.0 - 15.0 g/dL 12.5   12.4   12.8    ?HCT 36.0 - 46.0 % 38.8   38.4   39.9    ?Platelets 150 - 400 K/uL 326   347   310    ?NEUT# 1.7 - 7.7 K/uL 2.4      ?Lymph# 0.7 - 4.0 K/uL 1.7      ? ? ? ?There is no height or weight on file to calculate BMI. ? ?Orders:  ?Orders Placed This Encounter  ?Procedures  ? XR Shoulder Left  ? ?No orders of the defined types were placed in this encounter. ? ? ? Procedures: ?Large Joint Inj: L subacromial bursa on 04/27/2021 9:24 AM ?Indications: diagnostic evaluation and pain ?Details: 25 G 1.5 in needle ? ?Arthrogram: No ? ?Medications: 2 mL lidocaine 1 %; 80 mg methylPREDNISolone acetate 40 MG/ML; 2 mL bupivacaine 0.25 % ?Outcome: tolerated well, no immediate complications ?Procedure,  treatment alternatives, risks and benefits explained, specific risks discussed. Consent was given by the patient.  ? ? ? ?Clinical Data: ?No additional findings. ? ?ROS: ? ?All other systems negative, except as noted in the HPI. ?Review of Systems ? ?Objective: ?Vital Signs: LMP 09/01/2020 (Approximate)  ? ?Specialty Comments:  ?No specialty comments available. ? ?PMFS History: ?Patient Active Problem List  ? Diagnosis Date Noted  ? Adhesive capsulitis of left shoulder 04/27/2021  ? Right ear pain 03/14/2021  ? Status post bariatric surgery 11/22/2020  ? Cervical radiculopathy 10/13/2020  ? Abnormal uterine bleeding 09/27/2020  ? Cervical pain (neck) 09/16/2020  ? Encounter for examination following treatment at hospital 09/16/2020  ? Insulin resistance 09/14/2020  ? Near syncope 09/07/2020  ? Left shoulder pain 02/22/2020  ? Environmental and seasonal allergies 10/14/2019  ? Generalized anxiety disorder with panic attacks 08/20/2019  ? Arthritis 08/06/2019  ? Vitamin D deficiency 05/04/2019  ? Mixed obsessional thoughts and acts 12/12/2018  ? Seizure disorder ([place]) 10/13/2018  ? 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 07/16/2017  ? Class 2 severe obesity with serious comorbidity and body mass index (BMI) of 39.0 to 39.9 in adult [facility]) 01/23/2017  ? Vertigo 10/08/2016  ? Intractable chronic migraine without aura and without status migrainosus 10/08/2016  ? Seizures ([place]) 09/25/2016  ? Depression, major, single episode, moderate ([place]) 02/06/2012  ? ?Past Medical History:  ?Diagnosis Date  ? Acid reflux   ? Amenorrhea 02/06/2012  ? Anxiety   ? Arthritis   ? [name] 10/13/2019  ? B12 deficiency   ? Breast lump 08/06/2019  ? Cervical radiculitis   ? Chest pain   ? Chronic constipation   ? DDD (degenerative disc disease), lumbar   ? Depression   ? Depression   ? [name] 10/13/2019  ? Dizzy spells   ? Elevated vitamin B12 level 05/04/2019  ? Esophageal dysphagia 11/19/2012  ? Facial numbness   ? Family history of  systemic lupus erythematosus 10/22/2019  ? Fatty liver   ? GERD (gastroesophageal reflux disease)   ? [name] 10/13/2019  ? Headache(784.0) 04/01/2012  ? Heart palpitations 01/2017  ? High cholesterol   ? History of anemia   ? History of hiatal hernia   ? Hypersomnia due to another medical condition 06/12/2017  ? Hypertension   ? Insomnia   ? Intractable migraine  with visual aura and without status migrainosus 01/23/2017  ? Iron deficiency anemia   ? Irregular periods 08/06/2019  ? Joint pain   ? Lactose intolerance   ? LUQ pain 11/19/2012  ? Menopausal symptom 08/06/2019  ? Migraines   ? occ  ? Near syncope 01/2017  ? Numbness and tingling 10/08/2016  ? Formatting of this note might be different from the original. ---Oct 2018-TEE----Normal left ventricular size and systolic function with no appreciable segmental abnormality. EF 60% There was no evidence of spontaneous echo contrast or thrombus in the left atrium or left atrial appendage. No significant valvular abnormalites noted Bubble study performed, this is negative.  ? Numbness and tingling in left arm   ? Obesity   ? Other malaise and fatigue 05/19/2012  ? Panic attacks   ? Raynaud's phenomenon without gangrene 10/22/2019  ? Sciatica   ? Seizures ([place])   ? from [place]. last seizure was 4 months ago  ? Slurred speech 11/07/2016  ? Formatting of this note might be different from the original. ---Oct 2018-TEE----Normal left ventricular size and systolic function with no appreciable segmental abnormality. EF 60% There was no evidence of spontaneous echo contrast or thrombus in the left atrium or left atrial appendage. No significant valvular abnormalites noted Bubble study performed, this is negative.  ? Small bowel obstruction ([place]) 07/20/2017  ? Spells of speech arrest 01/23/2017  ? Transient cerebral ischemia 10/08/2016  ? Formatting of this note might be different from the original. ---Oct 2018-TEE----Normal left ventricular size and systolic function with no  appreciable segmental abnormality. EF 60% There was no evidence of spontaneous echo contrast or thrombus in the left atrium or left atrial appendage. No significant valvular abnormalites noted Bubble study performed, this i
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
## 2021-05-03 ENCOUNTER — Telehealth (INDEPENDENT_AMBULATORY_CARE_PROVIDER_SITE_OTHER): Payer: 59 | Admitting: Psychiatry

## 2021-05-03 ENCOUNTER — Encounter: Payer: Self-pay | Admitting: Psychiatry

## 2021-05-03 DIAGNOSIS — F331 Major depressive disorder, recurrent, moderate: Secondary | ICD-10-CM

## 2021-05-03 DIAGNOSIS — F410 Panic disorder [episodic paroxysmal anxiety] without agoraphobia: Secondary | ICD-10-CM

## 2021-05-03 MED ORDER — DESIPRAMINE HCL 25 MG PO TABS: 25.0000 mg | ORAL_TABLET | Freq: Every day | ORAL | 1 refills | Status: DC

## 2021-05-03 NOTE — Patient Instructions (Signed)
Discontinue vilazodone ?Start desipramine 25 mg at night  ?Next appointment: 6/8 at 8:40  ?

## 2021-05-04 ENCOUNTER — Other Ambulatory Visit: Payer: Self-pay | Admitting: Neurology

## 2021-05-08 ENCOUNTER — Telehealth: Payer: Self-pay | Admitting: *Deleted

## 2021-05-08 MED ORDER — PANTOPRAZOLE SODIUM 40 MG PO TBEC: 40.0000 mg | DELAYED_RELEASE_TABLET | Freq: Two times a day (BID) | ORAL | 5 refills | Status: DC

## 2021-05-08 NOTE — Telephone Encounter (Signed)
Received refill request from [facility] for Pantoprazole 40 mg 1 tablet twice daily.  I don't see any future appointments on file as of yet.  Last ov 01/28/2020 seen by Walden Field, NP.   ?

## 2021-05-08 NOTE — Addendum Note (Signed)
Addended by: [name] Menghini on: 05/08/2021 09:07 AM ? ? Modules accepted: Orders ? ?

## 2021-05-15 ENCOUNTER — Other Ambulatory Visit: Payer: Self-pay | Admitting: Neurology
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
## 2021-05-16 ENCOUNTER — Ambulatory Visit (HOSPITAL_COMMUNITY): Payer: Self-pay | Admitting: Psychiatry

## 2021-05-16 ENCOUNTER — Ambulatory Visit (HOSPITAL_COMMUNITY): Payer: 59 | Attending: Physician Assistant

## 2021-05-16 DIAGNOSIS — M7502 Adhesive capsulitis of left shoulder: Secondary | ICD-10-CM | POA: Diagnosis present

## 2021-05-16 DIAGNOSIS — M25512 Pain in left shoulder: Secondary | ICD-10-CM | POA: Diagnosis present

## 2021-05-16 DIAGNOSIS — G8929 Other chronic pain: Secondary | ICD-10-CM | POA: Diagnosis present

## 2021-05-17 ENCOUNTER — Encounter (INDEPENDENT_AMBULATORY_CARE_PROVIDER_SITE_OTHER): Payer: Self-pay | Admitting: Family Medicine

## 2021-05-17 ENCOUNTER — Ambulatory Visit (INDEPENDENT_AMBULATORY_CARE_PROVIDER_SITE_OTHER): Payer: 59 | Admitting: Family Medicine

## 2021-05-17 VITALS — BP 104/66 | HR 70 | Temp 98.0°F | Ht 62.0 in | Wt 143.0 lb

## 2021-05-17 DIAGNOSIS — Z7985 Long-term (current) use of injectable non-insulin antidiabetic drugs: Secondary | ICD-10-CM | POA: Diagnosis not present

## 2021-05-17 DIAGNOSIS — E669 Obesity, unspecified: Secondary | ICD-10-CM | POA: Diagnosis not present

## 2021-05-17 DIAGNOSIS — Z6826 Body mass index (BMI) 26.0-26.9, adult: Secondary | ICD-10-CM

## 2021-05-17 DIAGNOSIS — R0602 Shortness of breath: Secondary | ICD-10-CM

## 2021-05-17 DIAGNOSIS — E1165 Type 2 diabetes mellitus with hyperglycemia: Secondary | ICD-10-CM

## 2021-05-17 MED ORDER — TRULICITY 3 MG/0.5ML ~~LOC~~ SOAJ: 3.0000 mg | SUBCUTANEOUS | 0 refills | Status: DC

## 2021-05-19 ENCOUNTER — Other Ambulatory Visit (INDEPENDENT_AMBULATORY_CARE_PROVIDER_SITE_OTHER): Payer: Self-pay | Admitting: Family Medicine

## 2021-05-19 DIAGNOSIS — E1165 Type 2 diabetes mellitus with hyperglycemia: Secondary | ICD-10-CM

## 2021-05-22 ENCOUNTER — Ambulatory Visit (HOSPITAL_COMMUNITY): Payer: 59

## 2021-05-22 DIAGNOSIS — E1169 Type 2 diabetes mellitus with other specified complication: Secondary | ICD-10-CM | POA: Insufficient documentation

## 2021-05-22 DIAGNOSIS — R0602 Shortness of breath: Secondary | ICD-10-CM | POA: Insufficient documentation

## 2021-05-22 DIAGNOSIS — E1165 Type 2 diabetes mellitus with hyperglycemia: Secondary | ICD-10-CM | POA: Insufficient documentation

## 2021-05-22 NOTE — Progress Notes (Signed)
Chief Complaint:   OBESITY Tatum is here to discuss her progress with her obesity treatment plan along with follow-up of her obesity related diagnoses. Sharan is on the [place] +100 and states she is following her eating plan approximately 75% of the time. Ricarda states she is walking and doing PT 30 minutes 3 times per week.  Today's visit was #: 22 Starting weight: 217 lbs Starting date: 09/14/2019 Today's weight: 143 lbs Today's date: 05/17/2021 Total lbs lost to date: 74 lbs Total lbs lost since last in-office visit: 4  Interim History: Shaelee is doing mostly the same in terms of food. She eats mostly fish, seafood and vegetables.  She has added a protein shake back in to increase protein content.  Laiya denies any hunger.  She is eating mostly vegetables.  Raiven started PT on shoulder yesterday, she will be going to the beach for her birthday.  She feels good staying on the same meal plan.   Subjective:   1. SOBOE (shortness of breath on exertion) Amarissa has been feeling significantly more fatigued recently.  Last IC was 2 years ago and was 1386.  2. Type 2 diabetes mellitus with hyperglycemia, without long-term current use of insulin ([place]) Reham has been doing well on Trulicity.  Her last lab was in January 2023, A1c 5.0, Insulin 7.9.     Assessment/Plan:   1. SOBOE (shortness of breath on exertion) Talaya has agreed to repeat IC, today REE (RMR) at 1066  2. Type 2 diabetes mellitus with hyperglycemia, without long-term current use of insulin (HCC) Timmya has agreed to continue Trulicity 3 mg weekly.  Refilled today.  See below.  - Dulaglutide (TRULICITY) 3 LZ/7.6BH SOPN; Inject 3 mg into the skin once a week.  Dispense: 2 mL; Refill: 0  3. Obesity with current BMI of 26.2 Bonniejean is currently in the action stage of change. As such, her goal is to continue with weight loss efforts. She has agreed to the [employer].   Exercise goals: All adults  should avoid inactivity. Some physical activity is better than none, and adults who participate in any amount of physical activity gain some health benefits.  Stasia has agreed to add in more resistance training at least 10-15 minutes 3 times weekly.   Behavioral modification strategies: increasing lean protein intake, meal planning and cooking strategies, keeping healthy foods in the home, and planning for success.  Khandi has agreed to follow-up with our clinic in 6 weeks. She was informed of the importance of frequent follow-up visits to maximize her success with intensive lifestyle modifications for her multiple health conditions.    Objective:   Blood pressure 104/66, pulse 70, temperature 98 F (36.7 C), height '5\' 2"'$  (1.575 m), weight 143 lb (64.9 kg), last menstrual period 09/01/2020, SpO2 100 %. Body mass index is 26.16 kg/m.  General: Cooperative, alert, well developed, in no acute distress. HEENT: Conjunctivae and lids unremarkable. Cardiovascular: Regular rhythm.  Lungs: Normal work of breathing. Neurologic: No focal deficits.   Lab Results  Component Value Date   CREATININE 0.78 01/11/2021   BUN 7 01/11/2021   NA 140 01/11/2021   K 5.0 01/11/2021   CL 106 01/11/2021   CO2 25 01/11/2021   Lab Results  Component Value Date   ALT 27 01/11/2021   AST 26 01/11/2021   ALKPHOS 86 01/11/2021   BILITOT 0.3 01/11/2021   Lab Results  Component Value Date   HGBA1C 5.0 01/11/2021   HGBA1C  5.3 09/22/2020   HGBA1C 5.2 08/26/2020   HGBA1C 5.1 03/24/2020   HGBA1C 5.2 09/14/2019   Lab Results  Component Value Date   INSULIN 7.9 01/11/2021   INSULIN 5.0 09/14/2020   INSULIN 5.1 03/24/2020   INSULIN 5.9 09/14/2019   Lab Results  Component Value Date   TSH 1.390 09/14/2019   Lab Results  Component Value Date   CHOL 179 01/11/2021   HDL 56 01/11/2021   LDLCALC 113 (H) 01/11/2021   TRIG 49 01/11/2021   CHOLHDL 3.0 03/24/2020   Lab Results  Component Value Date    VD25OH 48.5 01/11/2021   VD25OH 80.1 09/14/2020   VD25OH 56.4 03/24/2020   Lab Results  Component Value Date   WBC 4.4 09/22/2020   HGB 12.5 09/22/2020   HCT 38.8 09/22/2020   MCV 98.7 09/22/2020   PLT 326 09/22/2020   Lab Results  Component Value Date   IRON 111 09/03/2019   TIBC 292 09/03/2019   FERRITIN 187 09/03/2019    Attestation Statements:   Reviewed by clinician on day of visit: allergies, medications, problem list, medical history, surgical history, family history, social history, and previous encounter notes.  I, Davy Pique, RMA, am acting as transcriptionist for Coralie Common, MD.  I have reviewed the above documentation for accuracy and completeness, and I agree with the above. - Coralie Common, MD

## 2021-05-24 ENCOUNTER — Ambulatory Visit: Payer: 59 | Admitting: Orthopaedic Surgery

## 2021-05-25 ENCOUNTER — Encounter (HOSPITAL_COMMUNITY): Payer: Self-pay

## 2021-05-25 ENCOUNTER — Ambulatory Visit (HOSPITAL_COMMUNITY): Payer: 59

## 2021-05-25 DIAGNOSIS — M7502 Adhesive capsulitis of left shoulder: Secondary | ICD-10-CM | POA: Diagnosis not present

## 2021-05-25 DIAGNOSIS — G8929 Other chronic pain: Secondary | ICD-10-CM
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
## 2021-05-30 ENCOUNTER — Telehealth: Payer: Self-pay

## 2021-05-30 ENCOUNTER — Ambulatory Visit (HOSPITAL_COMMUNITY): Payer: Self-pay | Admitting: Psychiatry

## 2021-05-30 NOTE — Telephone Encounter (Signed)
Thank you. Please let me know if anything else needed.

## 2021-05-30 NOTE — Telephone Encounter (Signed)
Please advise her to discontinue despiramine. Will plan to discuss plans at her next visit in a several days.

## 2021-05-30 NOTE — Telephone Encounter (Signed)
pt called left a message that the sleep medication desipramine is not working it is making her anxiety go threw the roof.

## 2021-05-31 ENCOUNTER — Encounter (HOSPITAL_COMMUNITY): Payer: 59

## 2021-06-02 ENCOUNTER — Ambulatory Visit (HOSPITAL_COMMUNITY): Payer: 59 | Attending: Physician Assistant | Admitting: Physical Therapy

## 2021-06-02 ENCOUNTER — Encounter (HOSPITAL_COMMUNITY): Payer: Self-pay | Admitting: Physical Therapy

## 2021-06-02 DIAGNOSIS — M25512 Pain in left shoulder: Secondary | ICD-10-CM | POA: Diagnosis present

## 2021-06-02 DIAGNOSIS — G8929 Other chronic pain: Secondary | ICD-10-CM | POA: Insufficient documentation

## 2021-06-02 DIAGNOSIS — M7502 Adhesive capsulitis of left shoulder: Secondary | ICD-10-CM | POA: Diagnosis present
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
## 2021-06-05 ENCOUNTER — Ambulatory Visit (HOSPITAL_COMMUNITY): Payer: 59

## 2021-06-05 ENCOUNTER — Encounter (HOSPITAL_COMMUNITY): Payer: Self-pay

## 2021-06-05 DIAGNOSIS — M7502 Adhesive capsulitis of left shoulder: Secondary | ICD-10-CM

## 2021-06-05 DIAGNOSIS — G8929 Other chronic pain: Secondary | ICD-10-CM
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
## 2021-06-07 ENCOUNTER — Encounter (HOSPITAL_COMMUNITY): Payer: 59 | Admitting: Physical Therapy

## 2021-06-08 ENCOUNTER — Telehealth (INDEPENDENT_AMBULATORY_CARE_PROVIDER_SITE_OTHER): Payer: 59 | Admitting: Psychiatry

## 2021-06-08 ENCOUNTER — Encounter: Payer: Self-pay | Admitting: Psychiatry

## 2021-06-08 DIAGNOSIS — F410 Panic disorder [episodic paroxysmal anxiety] without agoraphobia: Secondary | ICD-10-CM

## 2021-06-08 DIAGNOSIS — F331 Major depressive disorder, recurrent, moderate: Secondary | ICD-10-CM

## 2021-06-08 MED ORDER — LAMOTRIGINE 25 MG PO TABS: ORAL_TABLET | ORAL | 0 refills | Status: DC

## 2021-06-08 NOTE — Patient Instructions (Signed)
Discontinue desipramine Start lamotrigine 25 mg daily for 2 weeks, then 50 mg daily Next appointment: 7/19 at 8 AM

## 2021-06-12 ENCOUNTER — Telehealth (HOSPITAL_COMMUNITY): Payer: Self-pay

## 2021-06-12 ENCOUNTER — Ambulatory Visit (HOSPITAL_COMMUNITY): Payer: 59 | Attending: Physician Assistant

## 2021-06-12 NOTE — Telephone Encounter (Signed)
Left voicemail regarding no show for todays visit. Informed patient of no show policy. Reminded patient of upcoming appointment.

## 2021-06-13 ENCOUNTER — Ambulatory Visit (INDEPENDENT_AMBULATORY_CARE_PROVIDER_SITE_OTHER): Payer: Self-pay | Admitting: Psychiatry

## 2021-06-13 DIAGNOSIS — F331 Major depressive disorder, recurrent, moderate: Secondary | ICD-10-CM
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
## 2021-06-14 ENCOUNTER — Encounter (HOSPITAL_COMMUNITY): Payer: 59 | Admitting: Physical Therapy

## 2021-06-15 ENCOUNTER — Ambulatory Visit (HOSPITAL_COMMUNITY): Payer: Self-pay | Admitting: Psychiatry

## 2021-06-20 ENCOUNTER — Encounter (INDEPENDENT_AMBULATORY_CARE_PROVIDER_SITE_OTHER): Payer: Self-pay | Admitting: Family Medicine

## 2021-06-20 ENCOUNTER — Ambulatory Visit (INDEPENDENT_AMBULATORY_CARE_PROVIDER_SITE_OTHER): Payer: 59 | Admitting: Family Medicine

## 2021-06-20 VITALS — BP 102/65 | HR 62 | Temp 97.7°F | Ht 62.0 in | Wt 146.0 lb

## 2021-06-20 DIAGNOSIS — E669 Obesity, unspecified: Secondary | ICD-10-CM

## 2021-06-20 DIAGNOSIS — E1165 Type 2 diabetes mellitus with hyperglycemia: Secondary | ICD-10-CM

## 2021-06-20 DIAGNOSIS — E785 Hyperlipidemia, unspecified: Secondary | ICD-10-CM | POA: Diagnosis not present

## 2021-06-20 DIAGNOSIS — Z7985 Long-term (current) use of injectable non-insulin antidiabetic drugs: Secondary | ICD-10-CM

## 2021-06-20 DIAGNOSIS — Z6826 Body mass index (BMI) 26.0-26.9, adult: Secondary | ICD-10-CM | POA: Diagnosis not present

## 2021-06-20 MED ORDER — TRULICITY 3 MG/0.5ML ~~LOC~~ SOAJ: 3.0000 mg | SUBCUTANEOUS | 0 refills | Status: DC

## 2021-06-21 ENCOUNTER — Ambulatory Visit: Payer: 59 | Admitting: Orthopaedic Surgery

## 2021-06-22 NOTE — Progress Notes (Unsigned)
Chief Complaint:   OBESITY Brooke [name] is here to discuss her progress with her obesity treatment plan along with follow-up of her obesity related diagnoses. Alexsys is on the [employer] and states she is following her eating plan approximately 75% of the time. Soleia states she is walking/weights 45/30 minutes 3/2 times per week.  Today's visit was #: 23 Starting weight: 217 lbs Starting date: 09/14/2019 Today's weight: 146 lbs Today's date: 06/20/2021 Total lbs lost to date: 71 lbs Total lbs lost since last in-office visit: 0  Interim History: Maylani has noticed increase in snacking recently. She does not always eat all protein available in meal plan. No upcoming plans for 4th of July. Vacation at the end of June, she is going to [place].  Subjective:   1. Type 2 diabetes mellitus with hyperglycemia, without long-term current use of insulin (HCC) Tytiana's last A1c was 5.0. Noticing some increase in hunger and snacking.  2. Hyperlipidemia, unspecified hyperlipidemia type Sreeja's last LDL at 113, HDL at 56 and Trigly 49. She is not on medication.  Assessment/Plan:   1. Type 2 diabetes mellitus with hyperglycemia, without long-term current use of insulin (HCC) We will refill Trulicity 3 mg SubQ once weekly for 1 month with 0 refills.  -Refill Dulaglutide (TRULICITY) 3 QP/6.1PJ SOPN; Inject 3 mg into the skin once a week.  Dispense: 2 mL; Refill: 0  2. Hyperlipidemia, unspecified hyperlipidemia type We will obtain labs at next appointment. Not at goal yet.  3. Obesity with current BMI of 26.7 Alee is currently in the action stage of change. As such, her goal is to continue with weight loss efforts. She has agreed to the [employer]. Weigh and measure a few times a week.  Exercise goals: All adults should avoid inactivity. Some physical activity is better than none, and adults who participate in any amount of physical activity gain some health benefits.  Behavioral  modification strategies: increasing lean protein intake, meal planning and cooking strategies, keeping healthy foods in the home, and planning for success.  Kanisha has agreed to follow-up with our clinic in 5 weeks. She was informed of the importance of frequent follow-up visits to maximize her success with intensive lifestyle modifications for her multiple health conditions.   Objective:   Blood pressure 102/65, pulse 62, temperature 97.7 F (36.5 C), height '5\' 2"'$  (1.575 m), weight 146 lb (66.2 kg), last menstrual period 09/01/2020, SpO2 100 %. Body mass index is 26.7 kg/m.  General: Cooperative, alert, well developed, in no acute distress. HEENT: Conjunctivae and lids unremarkable. Cardiovascular: Regular rhythm.  Lungs: Normal work of breathing. Neurologic: No focal deficits.   Lab Results  Component Value Date   CREATININE 0.78 01/11/2021   BUN 7 01/11/2021   NA 140 01/11/2021   K 5.0 01/11/2021   CL 106 01/11/2021   CO2 25 01/11/2021   Lab Results  Component Value Date   ALT 27 01/11/2021   AST 26 01/11/2021   ALKPHOS 86 01/11/2021   BILITOT 0.3 01/11/2021   Lab Results  Component Value Date   HGBA1C 5.0 01/11/2021   HGBA1C 5.3 09/22/2020   HGBA1C 5.2 08/26/2020   HGBA1C 5.1 03/24/2020   HGBA1C 5.2 09/14/2019   Lab Results  Component Value Date   INSULIN 7.9 01/11/2021   INSULIN 5.0 09/14/2020   INSULIN 5.1 03/24/2020   INSULIN 5.9 09/14/2019   Lab Results  Component Value Date   TSH 1.390 09/14/2019   Lab Results  Component Value  Date   CHOL 179 01/11/2021   HDL 56 01/11/2021   LDLCALC 113 (H) 01/11/2021   TRIG 49 01/11/2021   CHOLHDL 3.0 03/24/2020   Lab Results  Component Value Date   VD25OH 48.5 01/11/2021   VD25OH 80.1 09/14/2020   VD25OH 56.4 03/24/2020   Lab Results  Component Value Date   WBC 4.4 09/22/2020   HGB 12.5 09/22/2020   HCT 38.8 09/22/2020   MCV 98.7 09/22/2020   PLT 326 09/22/2020   Lab Results  Component Value Date    IRON 111 09/03/2019   TIBC 292 09/03/2019   FERRITIN 187 09/03/2019    Attestation Statements:   Reviewed by clinician on day of visit: allergies, medications, problem list, medical history, surgical history, family history, social history, and previous encounter notes.  I, Elnora Morrison, RMA am acting as transcriptionist for Coralie Common, MD.  I have reviewed the above documentation for accuracy and completeness, and I agree with the above. -  ***

## 2021-06-24 IMAGING — CT CT HEAD WITHOUT CONTRAST
3 series · 16 of 47 positions shown, 19 images · non-contrast
Comparison: Head CT and brain MRI 09/24/2016

CLINICAL DATA: Altered level of consciousness (LOC), unexplained

EXAM:
CT HEAD WITHOUT CONTRAST
TECHNIQUE: Contiguous axial images were obtained from the base of the skull
through the vertex without intravenous contrast.

[Series 2: head w o · axial · 0.42mm/px · z∈[+1138,+1278]mm · 10 of 34 slices shown, 13 images]
[im 3/34  brain]
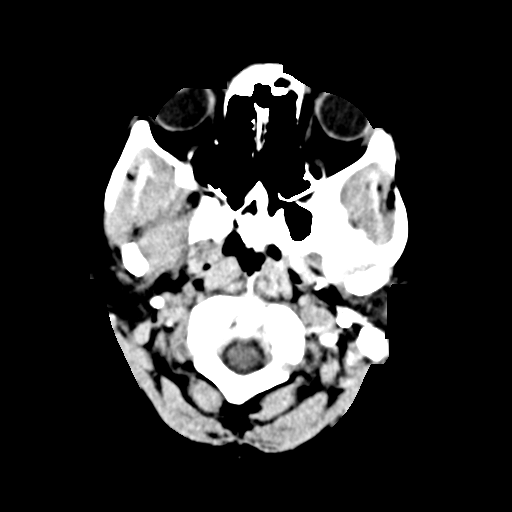
[im 3/34  bone]
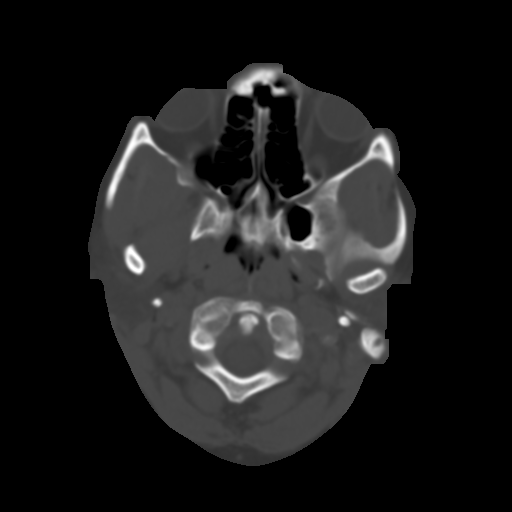
[im 6/34  brain]
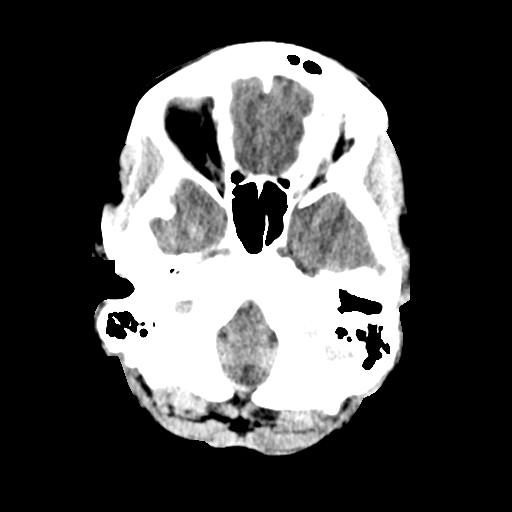
[im 10/34  brain]
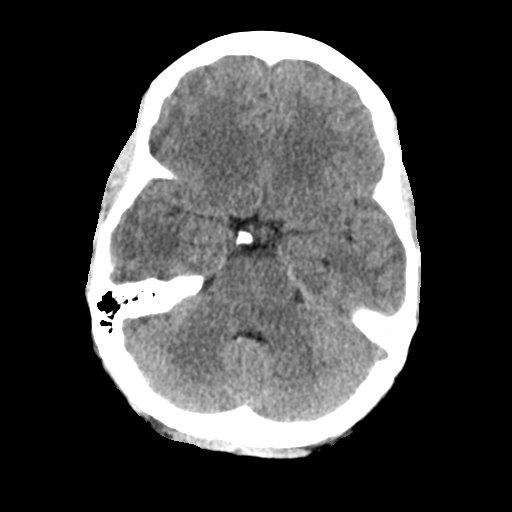
[im 12/34  brain]
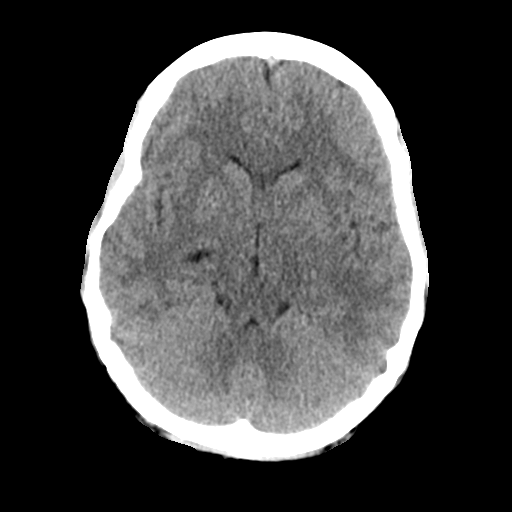
[im 15/34  brain]
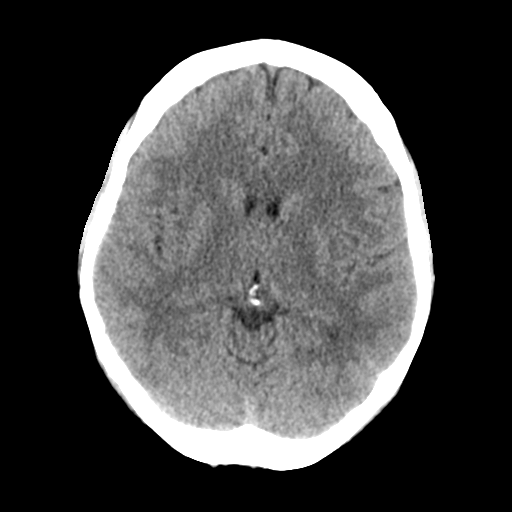
[im 15/34  bone]
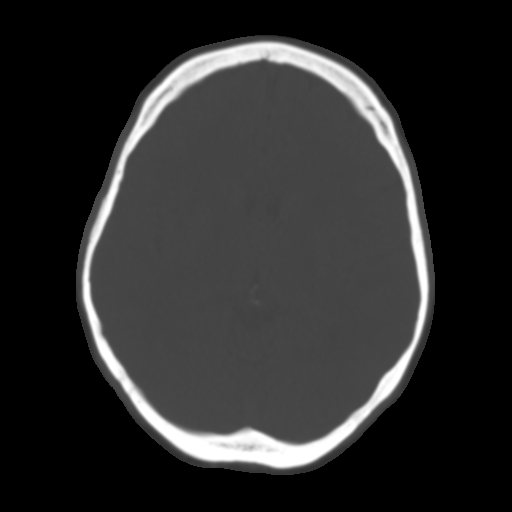
[im 19/34  brain]
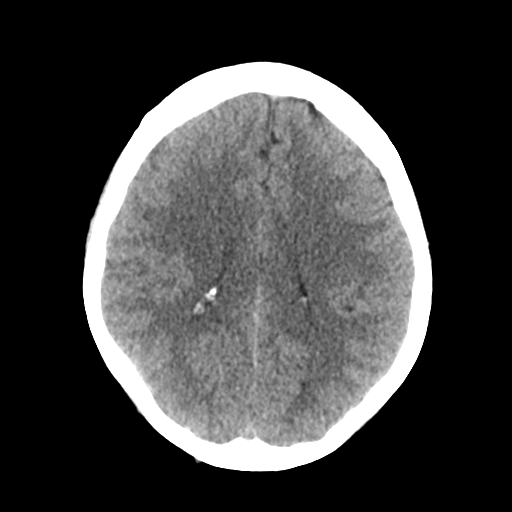
[im 22/34  brain]
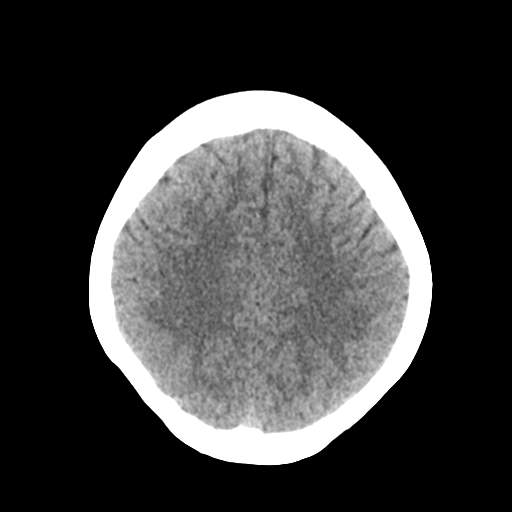
[im 26/34  brain]
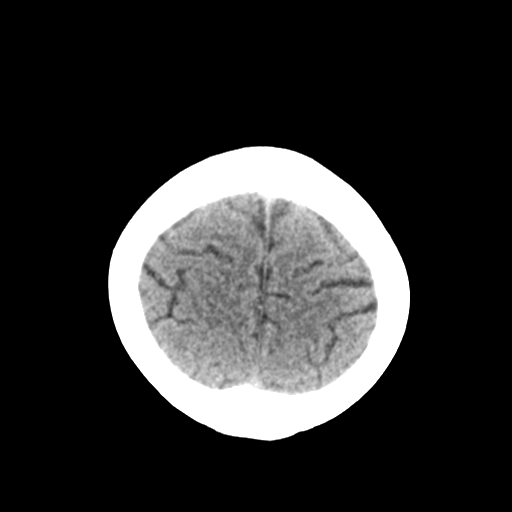
[im 28/34  brain]
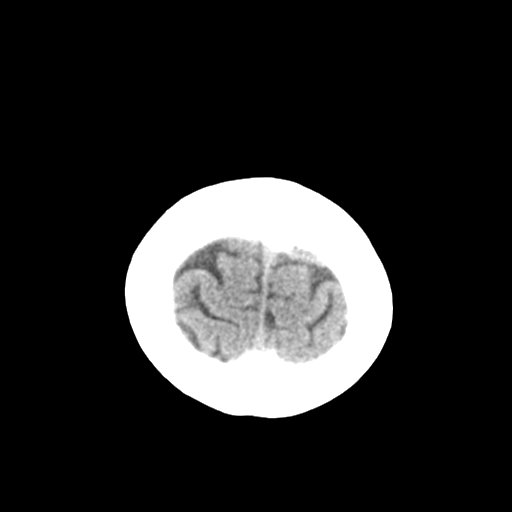
[im 28/34  bone]
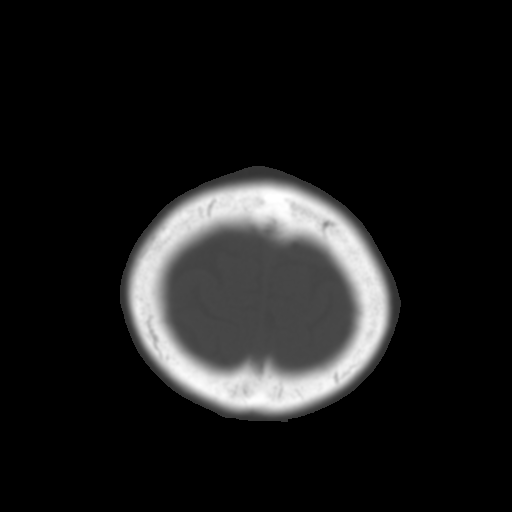
[im 31/34  brain]
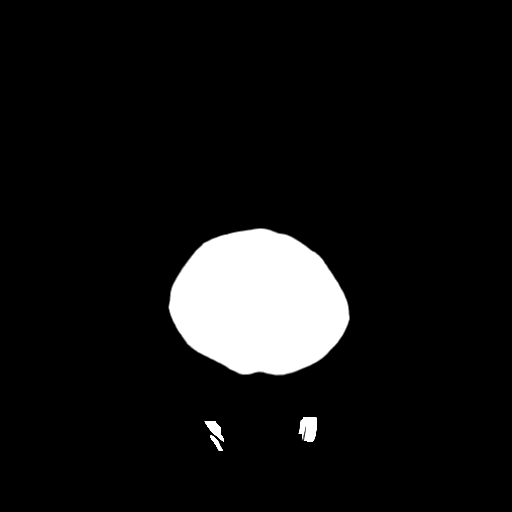

[Series 4: coronal soft · coronal · 0.35mm/px · 3 of 72 slices shown]
[im 24/72  brain]
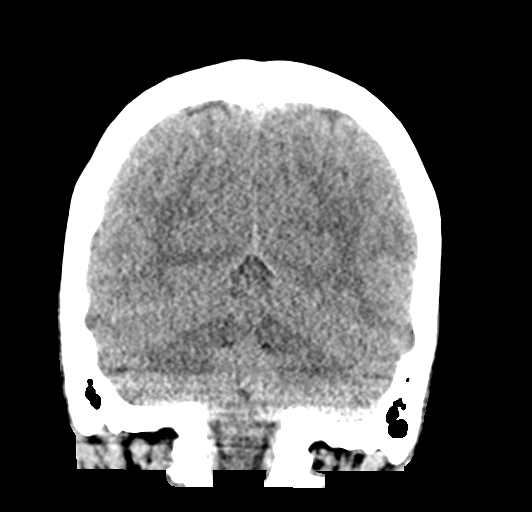
[im 32/72  brain]
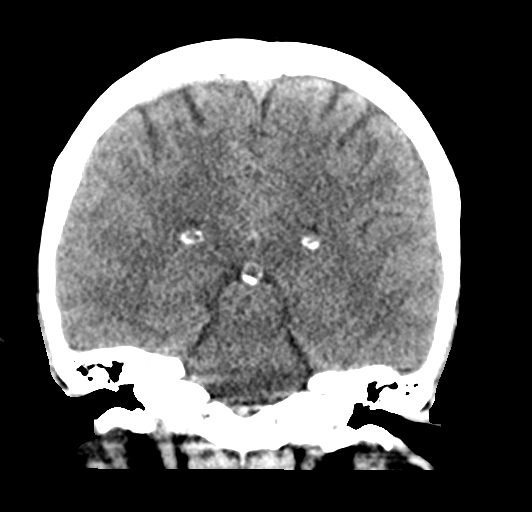
[im 40/72  brain]
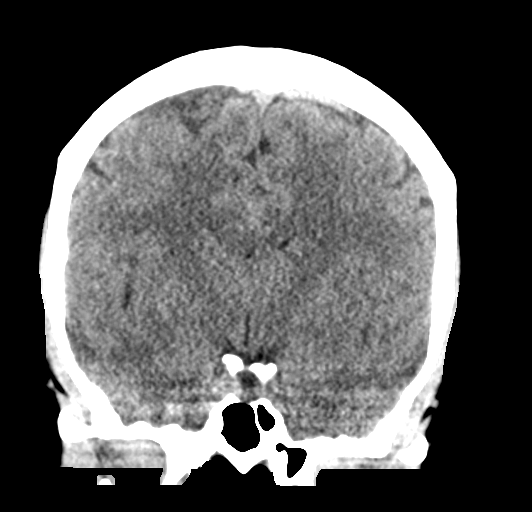

[Series 5: sagittal soft · sagittal · 0.34mm/px · 3 of 61 slices shown]
[im 21/61  brain]
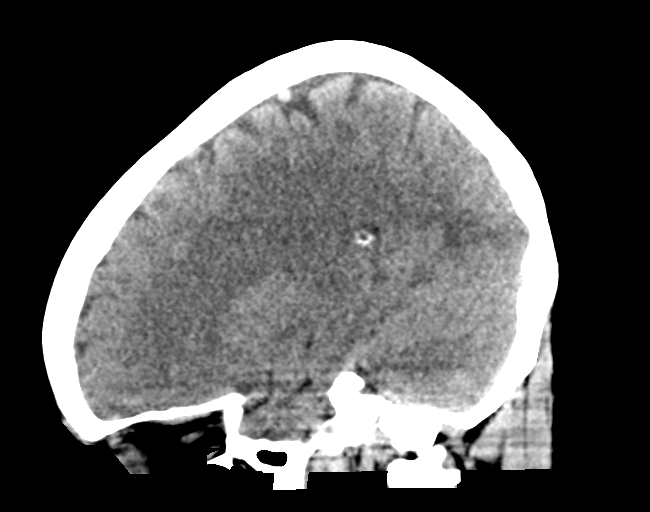
[im 31/61  brain]
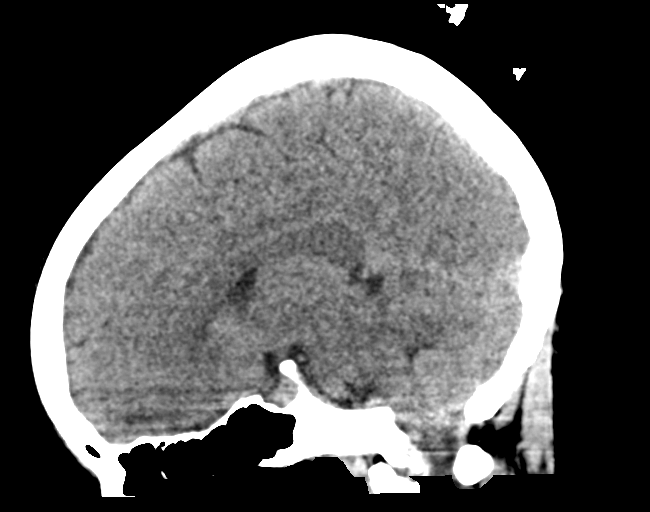
[im 41/61  brain]
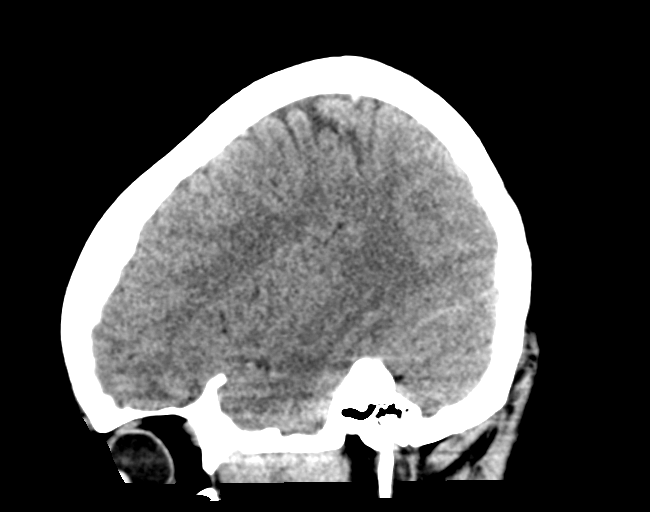

[16 of 47 positions shown; findings below may reference images not displayed]

FINDINGS: Brain: No intracranial hemorrhage, mass effect, or midline shift. No
hydrocephalus. The basilar cisterns are patent. No evidence of
territorial infarct or acute ischemia. No extra-axial or
intracranial fluid collection. Slightly low lying cerebellar
tonsils.

Vascular: No hyperdense vessel

Skull: No fracture or focal lesion.

Sinuses/Orbits: Paranasal sinuses and mastoid air cells are clear.
Left ocular lens is not visualized, as seen on prior, with dependent
hyperdensity in the globe.

Other: None.
IMPRESSION: No acute intracranial abnormality.

## 2021-06-28 ENCOUNTER — Encounter: Payer: Self-pay | Admitting: Gastroenterology

## 2021-06-28 ENCOUNTER — Telehealth: Payer: Self-pay | Admitting: *Deleted

## 2021-06-28 ENCOUNTER — Other Ambulatory Visit: Payer: Self-pay | Admitting: Neurology

## 2021-06-28 ENCOUNTER — Ambulatory Visit (INDEPENDENT_AMBULATORY_CARE_PROVIDER_SITE_OTHER): Payer: 59 | Admitting: Gastroenterology

## 2021-06-28 VITALS — BP 122/82 | HR 59 | Temp 98.4°F | Ht 62.0 in | Wt 148.9 lb

## 2021-06-28 DIAGNOSIS — K5900 Constipation, unspecified: Secondary | ICD-10-CM

## 2021-06-28 DIAGNOSIS — R1310 Dysphagia, unspecified: Secondary | ICD-10-CM

## 2021-06-28 MED ORDER — MOTEGRITY 2 MG PO TABS: 1.0000 | ORAL_TABLET | Freq: Every day | ORAL | 5 refills | Status: DC

## 2021-06-28 NOTE — Patient Instructions (Addendum)
We are arranging an upper endoscopy with dilation by Dr. Gala Romney in the near future.  I would like for you to try Motegrity for constipation. I sent this into your pharmacy. Take once daily, with or without food. Unfortunately, I don't have samples here. Let us know if any issues in getting this filled!  We will see you in follow-up after procedure!  It was a pleasure to see you today. I want to create trusting relationships with patients to provide genuine, compassionate, and quality care. I value your feedback. If you receive a survey regarding your visit,  I greatly appreciate you taking time to fill this out.   Annitta Needs, PhD, ANP-BC [facility]
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
## 2021-06-28 NOTE — Telephone Encounter (Signed)
Spoke with pt. She has been scheduled for EGD/DIL with Dr. Gala Romney, ASA 2 on 7/17 at 2:45pm. Aware will mail instructions.
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
## 2021-06-29 ENCOUNTER — Ambulatory Visit (INDEPENDENT_AMBULATORY_CARE_PROVIDER_SITE_OTHER): Payer: 59 | Admitting: Orthopaedic Surgery

## 2021-06-29 ENCOUNTER — Encounter: Payer: Self-pay | Admitting: Orthopaedic Surgery

## 2021-06-29 DIAGNOSIS — G8929 Other chronic pain: Secondary | ICD-10-CM

## 2021-06-29 DIAGNOSIS — M7502 Adhesive capsulitis of left shoulder: Secondary | ICD-10-CM

## 2021-06-29 DIAGNOSIS — M25512 Pain in left shoulder: Secondary | ICD-10-CM
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
## 2021-06-30 ENCOUNTER — Telehealth: Payer: Self-pay

## 2021-06-30 NOTE — Telephone Encounter (Signed)
Returned pt's call from yesterday and was advised that her insurance will not cover the Rx [employer]. The pt has [employer] and it doesn't cover any Rx's. Pt was given information off of a discount card for [employer] to give to the pharmacy for a discount. Pt will call back if she has any problems

## 2021-07-03 ENCOUNTER — Other Ambulatory Visit: Payer: Self-pay | Admitting: Psychiatry

## 2021-07-06 ENCOUNTER — Ambulatory Visit (HOSPITAL_COMMUNITY): Payer: Self-pay | Admitting: Psychiatry

## 2021-07-06 ENCOUNTER — Encounter (HOSPITAL_COMMUNITY): Payer: Self-pay

## 2021-07-12 ENCOUNTER — Telehealth: Payer: Self-pay | Admitting: *Deleted

## 2021-07-12 NOTE — Telephone Encounter (Signed)
Received fax from [facility] that [place] approved 07/12/21-07/13/22. Request ID: [id number].

## 2021-07-12 NOTE — Telephone Encounter (Signed)
Submitted PA [employer] on CMM. Key: [id number] - PA Case ID: [id number]. Waiting on determination from [facility].
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
## 2021-07-17 ENCOUNTER — Encounter (HOSPITAL_COMMUNITY): Payer: Self-pay | Admitting: Internal Medicine

## 2021-07-17 ENCOUNTER — Ambulatory Visit (HOSPITAL_COMMUNITY)
Admission: RE | Admit: 2021-07-17 | Discharge: 2021-07-17 | Disposition: A | Discharge status: Home / Self Care | Payer: 59 | Attending: Internal Medicine | Admitting: Internal Medicine

## 2021-07-17 ENCOUNTER — Encounter (HOSPITAL_COMMUNITY)
Admission: RE | Disposition: A | Discharge status: Home / Self Care | Payer: Self-pay | Source: Home / Self Care | Attending: Internal Medicine

## 2021-07-17 ENCOUNTER — Ambulatory Visit (HOSPITAL_COMMUNITY): Payer: 59 | Admitting: Anesthesiology

## 2021-07-17 ENCOUNTER — Other Ambulatory Visit: Payer: Self-pay

## 2021-07-17 ENCOUNTER — Ambulatory Visit (HOSPITAL_BASED_OUTPATIENT_CLINIC_OR_DEPARTMENT_OTHER): Payer: 59 | Admitting: Anesthesiology

## 2021-07-17 DIAGNOSIS — M7502 Adhesive capsulitis of left shoulder: Secondary | ICD-10-CM

## 2021-07-17 DIAGNOSIS — N939 Abnormal uterine and vaginal bleeding, unspecified: Secondary | ICD-10-CM

## 2021-07-17 DIAGNOSIS — G40909 Epilepsy, unspecified, not intractable, without status epilepticus: Secondary | ICD-10-CM

## 2021-07-17 DIAGNOSIS — E119 Type 2 diabetes mellitus without complications: Secondary | ICD-10-CM | POA: Insufficient documentation

## 2021-07-17 DIAGNOSIS — M1990 Unspecified osteoarthritis, unspecified site: Secondary | ICD-10-CM

## 2021-07-17 DIAGNOSIS — R42 Dizziness and giddiness: Secondary | ICD-10-CM

## 2021-07-17 DIAGNOSIS — H9201 Otalgia, right ear: Secondary | ICD-10-CM

## 2021-07-17 DIAGNOSIS — R569 Unspecified convulsions: Secondary | ICD-10-CM

## 2021-07-17 DIAGNOSIS — R1310 Dysphagia, unspecified: Secondary | ICD-10-CM | POA: Diagnosis present

## 2021-07-17 DIAGNOSIS — R55 Syncope and collapse: Secondary | ICD-10-CM

## 2021-07-17 DIAGNOSIS — G43719 Chronic migraine without aura, intractable, without status migrainosus: Secondary | ICD-10-CM

## 2021-07-17 DIAGNOSIS — M25512 Pain in left shoulder: Secondary | ICD-10-CM

## 2021-07-17 DIAGNOSIS — Z09 Encounter for follow-up examination after completed treatment for conditions other than malignant neoplasm: Secondary | ICD-10-CM

## 2021-07-17 DIAGNOSIS — Z9884 Bariatric surgery status: Secondary | ICD-10-CM | POA: Diagnosis not present

## 2021-07-17 DIAGNOSIS — K219 Gastro-esophageal reflux disease without esophagitis: Secondary | ICD-10-CM | POA: Insufficient documentation

## 2021-07-17 DIAGNOSIS — E559 Vitamin D deficiency, unspecified: Secondary | ICD-10-CM

## 2021-07-17 DIAGNOSIS — F321 Major depressive disorder, single episode, moderate: Secondary | ICD-10-CM

## 2021-07-17 DIAGNOSIS — E88819 Insulin resistance, unspecified: Secondary | ICD-10-CM

## 2021-07-17 DIAGNOSIS — R0602 Shortness of breath: Secondary | ICD-10-CM

## 2021-07-17 DIAGNOSIS — F410 Panic disorder [episodic paroxysmal anxiety] without agoraphobia: Secondary | ICD-10-CM

## 2021-07-17 DIAGNOSIS — J3089 Other allergic rhinitis: Secondary | ICD-10-CM

## 2021-07-17 DIAGNOSIS — E8881 Metabolic syndrome: Secondary | ICD-10-CM

## 2021-07-17 DIAGNOSIS — K449 Diaphragmatic hernia without obstruction or gangrene: Secondary | ICD-10-CM | POA: Diagnosis not present

## 2021-07-17 DIAGNOSIS — Z87891 Personal history of nicotine dependence: Secondary | ICD-10-CM | POA: Diagnosis not present

## 2021-07-17 DIAGNOSIS — F422 Mixed obsessional thoughts and acts: Secondary | ICD-10-CM

## 2021-07-17 DIAGNOSIS — Z79899 Other long term (current) drug therapy: Secondary | ICD-10-CM | POA: Diagnosis not present

## 2021-07-17 DIAGNOSIS — E785 Hyperlipidemia, unspecified: Secondary | ICD-10-CM

## 2021-07-17 DIAGNOSIS — M542 Cervicalgia: Secondary | ICD-10-CM

## 2021-07-17 DIAGNOSIS — Z7984 Long term (current) use of oral hypoglycemic drugs: Secondary | ICD-10-CM | POA: Insufficient documentation

## 2021-07-17 DIAGNOSIS — E1165 Type 2 diabetes mellitus with hyperglycemia: Secondary | ICD-10-CM

## 2021-07-17 DIAGNOSIS — I10 Essential (primary) hypertension: Secondary | ICD-10-CM | POA: Diagnosis not present

## 2021-07-17 DIAGNOSIS — K5900 Constipation, unspecified: Secondary | ICD-10-CM | POA: Diagnosis not present

## 2021-07-17 DIAGNOSIS — Z6839 Body mass index (BMI) 39.0-39.9, adult: Secondary | ICD-10-CM

## 2021-07-17 DIAGNOSIS — M5412 Radiculopathy, cervical region: Secondary | ICD-10-CM

## 2021-07-17 HISTORY — PX: BIOPSY: SHX5522

## 2021-07-17 HISTORY — PX: ESOPHAGOGASTRODUODENOSCOPY (EGD) WITH PROPOFOL: SHX5813

## 2021-07-17 HISTORY — PX: MALONEY DILATION: SHX5535

## 2021-07-17 LAB — GLUCOSE, CAPILLARY
Glucose-Capillary: 59 mg/dL — ABNORMAL LOW (ref 70–99)
Glucose-Capillary: 118 mg/dL — ABNORMAL HIGH (ref 70–99)
Glucose-Capillary: 69 mg/dL — ABNORMAL LOW (ref 70–99)
Glucose-Capillary: 63 mg/dL — ABNORMAL LOW (ref 70–99)
Glucose-Capillary: 71 mg/dL (ref 70–99)

## 2021-07-17 SURGERY — ESOPHAGOGASTRODUODENOSCOPY (EGD) WITH PROPOFOL
Anesthesia: General

## 2021-07-17 MED ORDER — DEXTROSE 50 % IV SOLN
25.0000 g | Freq: Once | INTRAVENOUS | Status: AC
  Administered 2021-07-17: 25 g via INTRAVENOUS

## 2021-07-17 MED ORDER — PROPOFOL 500 MG/50ML IV EMUL
INTRAVENOUS | Status: DC | PRN
  Administered 2021-07-17: 150 ug/kg/min via INTRAVENOUS

## 2021-07-17 MED ORDER — LACTATED RINGERS IV SOLN: INTRAVENOUS | Status: DC

## 2021-07-17 MED ORDER — DEXTROSE 50 % IV SOLN
INTRAVENOUS | Status: AC
  Filled 2021-07-17: qty 50

## 2021-07-17 MED ORDER — GLYCOPYRROLATE 0.2 MG/ML IJ SOLN
INTRAMUSCULAR | Status: DC | PRN
  Administered 2021-07-17: .2 mg via INTRAVENOUS

## 2021-07-17 MED ORDER — GLYCOPYRROLATE PF 0.2 MG/ML IJ SOSY
PREFILLED_SYRINGE | INTRAMUSCULAR | Status: AC
  Filled 2021-07-17: qty 1

## 2021-07-17 NOTE — Op Note (Signed)
[facility] Patient Name: Brooke [name] Procedure Date: 07/17/2021 10:47 AM MRN: [id number] Date of Birth: 1970/07/14 Attending MD: Norvel Richards , MD CSN: [id number] Age: 51 Admit Type: Outpatient Procedure:                Upper GI endoscopy Indications:              Dysphagia Providers:                Norvel Richards, MD, Janeece Riggers, RN, Raphael Gibney, Technician Referring MD:              Medicines:                Propofol per Anesthesia Complications:            No immediate complications. Estimated Blood Loss:     Estimated blood loss was minimal. Procedure:                Pre-Anesthesia Assessment:                           - Prior to the procedure, a History and Physical                            was performed, and patient medications and                            allergies were reviewed. The patient's tolerance of                            previous anesthesia was also reviewed. The risks                            and benefits of the procedure and the sedation                            options and risks were discussed with the patient.                            All questions were answered, and informed consent                            was obtained. Prior Anticoagulants: The patient has                            taken no previous anticoagulant or antiplatelet                            agents. ASA Grade Assessment: II - A patient with                            mild systemic disease. After reviewing the risks  and benefits, the patient was deemed in                            satisfactory condition to undergo the procedure.                           After obtaining informed consent, the endoscope was                            passed under direct vision. Throughout the                            procedure, the patient's blood pressure, pulse, and                            oxygen saturations were  monitored continuously. The                            [id number]-H190 ([id number]) scope was introduced through the                            mouth, and advanced to the efferent jejunal loop.                            The upper GI endoscopy was accomplished without                            difficulty. The patient tolerated the procedure                            well. Scope In: 11:02:54 AM Scope Out: 11:10:06 AM Total Procedure Duration: 0 hours 7 minutes 12 seconds  Findings:      Short aferret limb. Patent and normal-appearing efferent small bowel       limbs - appeared normal. Residual gastric mucosa appeared normal.       Retroflexed view the proximal residual stomach revealed no       abnormalities. The scope was withdrawn. Dilation was performed with a       Maloney dilator with mild resistance at 39 Fr. The dilation site was       examined following endoscope reinsertion and showed no change. Estimated       blood loss was minimal.      Small tear noted through the UES. Biopsies of mid and distal esophagus       subsequent taken for histologic study. Impression:               Status post prior gastric bypass surgery as                            described.                           - Normal esophagus. Dilated.                           - . Moderate Sedation:  Moderate (conscious) sedation was personally administered by an       anesthesia professional. The following parameters were monitored: oxygen       saturation, heart rate, blood pressure, and response to care. Recommendation:           - Patient has a contact number available for                            emergencies. The signs and symptoms of potential                            delayed complications were discussed with the                            patient. Return to normal activities tomorrow.                            Written discharge instructions were provided to the                            patient.                            - Advance diet as tolerated. Office visit with [place]                            in 6 months. Follow-up on pathology. Procedure Code(s):        --- Professional ---                           [phone], Esophagogastroduodenoscopy, flexible,                            transoral; diagnostic, including collection of                            specimen(s) by brushing or washing, when performed                            (separate procedure)                           43450, Dilation of esophagus, by unguided sound or                            bougie, single or multiple passes Diagnosis Code(s):        --- Professional ---                           R13.10, Dysphagia, unspecified CPT copyright 2019 American Medical Association. All rights reserved. The codes documented in this report are preliminary and upon coder review may  be revised to meet current compliance requirements. Cristopher Estimable. Joby Richart, MD Norvel Richards, MD 07/17/2021 11:18:26 AM This report has been signed electronically. Number of Addenda: 0

## 2021-07-17 NOTE — Anesthesia Postprocedure Evaluation (Signed)
Anesthesia Post Note  Patient: Brooke [name]  Procedure(s) Performed: ESOPHAGOGASTRODUODENOSCOPY (EGD) WITH PROPOFOL [place]  Patient location during evaluation: Endoscopy Anesthesia Type: General Level of consciousness: awake and alert and oriented Pain management: pain level controlled Vital Signs Assessment: post-procedure vital signs reviewed and stable Respiratory status: spontaneous breathing, nonlabored ventilation and respiratory function stable Cardiovascular status: blood pressure returned to baseline and stable Postop Assessment: no apparent nausea or vomiting Anesthetic complications: no   There were no known notable events for this encounter.   Last Vitals:  Vitals:   07/17/21 1020 07/17/21 1112  BP: 129/77 (!) 99/56  Pulse: 60 100  Resp: (!) 21 20  Temp: 36.8 C 36.8 C  SpO2: 100% 99%    Last Pain:  Vitals:   07/17/21 1112  TempSrc: Axillary  PainSc: 0-No pain                 Joseph Bias C Kellsey Sansone

## 2021-07-17 NOTE — Discharge Instructions (Signed)
EGD Discharge instructions Please read the instructions outlined below and refer to this sheet in the next few weeks. These discharge instructions provide you with general information on caring for yourself after you leave the hospital. Your doctor may also give you specific instructions. While your treatment has been planned according to the most current medical practices available, unavoidable complications occasionally occur. If you have any problems or questions after discharge, please call your doctor. ACTIVITY You may resume your regular activity but move at a slower pace for the next 24 hours.  Take frequent rest periods for the next 24 hours.  Walking will help expel (get rid of) the air and reduce the bloated feeling in your abdomen.  No driving for 24 hours (because of the anesthesia (medicine) used during the test).  You may shower.  Do not sign any important legal documents or operate any machinery for 24 hours (because of the anesthesia used during the test).  NUTRITION Drink plenty of fluids.  You may resume your normal diet.  Begin with a light meal and progress to your normal diet.  Avoid alcoholic beverages for 24 hours or as instructed by your caregiver.  MEDICATIONS You may resume your normal medications unless your caregiver tells you otherwise.  WHAT YOU CAN EXPECT TODAY You may experience abdominal discomfort such as a feeling of fullness or "gas" pains.  FOLLOW-UP Your doctor will discuss the results of your test with you.  SEEK IMMEDIATE MEDICAL ATTENTION IF ANY OF THE FOLLOWING OCCUR: Excessive nausea (feeling sick to your stomach) and/or vomiting.  Severe abdominal pain and distention (swelling).  Trouble swallowing.  Temperature over 101 F (37.8 C).  Rectal bleeding or vomiting of blood.    Your esophagus was stretched today.  Your esophagus was biopsied today.  You may or may not have a mild sore throat for a day or 2 but this will subside.  Further  recommendations to follow pending review of pathology report  Office visit with [place] in 6 months  At patient request, I called Raynelle Dick at [phone] findings and recommendations

## 2021-07-17 NOTE — Transfer of Care (Signed)
Immediate Anesthesia Transfer of Care Note  Patient: Brooke [name]  Procedure(s) Performed: ESOPHAGOGASTRODUODENOSCOPY (EGD) WITH PROPOFOL [place]  Patient Location: PACU and Endoscopy Unit  Anesthesia Type:General  Level of Consciousness: awake  Airway & Oxygen Therapy: Patient Spontanous Breathing  Post-op Assessment: Report given to RN  Post vital signs: Reviewed  Last Vitals:  Vitals Value Taken Time  BP    Temp    Pulse    Resp    SpO2      Last Pain:  Vitals:   07/17/21 1055  TempSrc: Oral  PainSc: 0-No pain      Patients Stated Pain Goal: 6 (22/57/50 5183)  Complications: There were no known notable events for this encounter.

## 2021-07-17 NOTE — Anesthesia Preprocedure Evaluation (Addendum)
Anesthesia Evaluation  Patient identified by MRN, date of birth, ID band Patient awake    Reviewed: Allergy & Precautions, NPO status , Patient's Chart, lab work & pertinent test results  Airway Mallampati: II  TM Distance: >3 FB Neck ROM: Full    Dental  (+) Dental Advisory Given, Missing   Pulmonary neg pulmonary ROS, former smoker,    Pulmonary exam normal breath sounds clear to auscultation       Cardiovascular Exercise Tolerance: Good hypertension, Pt. on medications Normal cardiovascular exam Rhythm:Regular Rate:Normal     Neuro/Psych  Headaches, Seizures - (last seizure attack - 3 months ago), Well Controlled,  PSYCHIATRIC DISORDERS Anxiety Depression  Neuromuscular disease    GI/Hepatic Neg liver ROS, hiatal hernia, GERD (dysphagia)  Medicated and Controlled,  Endo/Other  diabetes, Well Controlled, Type 2, Oral Hypoglycemic Agents  Renal/GU negative Renal ROS  negative genitourinary   Musculoskeletal  (+) Arthritis , Osteoarthritis,    Abdominal   Peds negative pediatric ROS (+)  Hematology  (+) Blood dyscrasia, anemia ,   Anesthesia Other Findings   Reproductive/Obstetrics negative OB ROS                           Anesthesia Physical Anesthesia Plan  ASA: 2  Anesthesia Plan: General   Post-op Pain Management: Minimal or no pain anticipated   Induction: Intravenous  PONV Risk Score and Plan: Propofol infusion  Airway Management Planned: Nasal Cannula and Natural Airway  Additional Equipment:   Intra-op Plan:   Post-operative Plan:   Informed Consent: I have reviewed the patients History and Physical, chart, labs and discussed the procedure including the risks, benefits and alternatives for the proposed anesthesia with the patient or authorized representative who has indicated his/her understanding and acceptance.     Dental advisory given  Plan Discussed with: CRNA  and Surgeon  Anesthesia Plan Comments:        Anesthesia Quick Evaluation

## 2021-07-17 NOTE — Interval H&P Note (Signed)
History and Physical Interval Note:  07/17/2021 10:51 AM  Brooke [name]  has presented today for surgery, with the diagnosis of DYSPHAGIA.  The various methods of treatment have been discussed with the patient and family. After consideration of risks, benefits and other options for treatment, the patient has consented to  Procedure(s) with comments: ESOPHAGOGASTRODUODENOSCOPY (EGD) WITH PROPOFOL (N/A) - 2:45PM MALONEY DILATION (N/A) as a surgical intervention.  The patient's history has been reviewed, patient examined, no change in status, stable for surgery.  I have reviewed the patient's chart and labs.  Questions were answered to the patient's satisfaction.     Brooke [name]  No change.  EGD today per plan.  The risks, benefits, limitations, alternatives and imponderables have been reviewed with the patient. Potential for esophageal dilation, biopsy, etc. have also been reviewed.  Questions have been answered. All parties agreeable.

## 2021-07-18 LAB — SURGICAL PATHOLOGY

## 2021-07-19 ENCOUNTER — Other Ambulatory Visit: Payer: Self-pay | Admitting: Psychiatry

## 2021-07-19 ENCOUNTER — Encounter: Payer: Self-pay | Admitting: Psychiatry

## 2021-07-19 ENCOUNTER — Encounter: Payer: Self-pay | Admitting: Internal Medicine

## 2021-07-19 ENCOUNTER — Telehealth (INDEPENDENT_AMBULATORY_CARE_PROVIDER_SITE_OTHER): Payer: 59 | Admitting: Psychiatry

## 2021-07-19 DIAGNOSIS — F330 Major depressive disorder, recurrent, mild: Secondary | ICD-10-CM | POA: Diagnosis not present

## 2021-07-19 MED ORDER — LAMOTRIGINE 100 MG PO TABS: 100.0000 mg | ORAL_TABLET | Freq: Every day | ORAL | 1 refills | Status: DC

## 2021-07-20 ENCOUNTER — Ambulatory Visit (INDEPENDENT_AMBULATORY_CARE_PROVIDER_SITE_OTHER): Payer: 59 | Admitting: Psychiatry

## 2021-07-20 ENCOUNTER — Encounter (HOSPITAL_COMMUNITY): Payer: Self-pay | Admitting: Internal Medicine

## 2021-07-20 ENCOUNTER — Encounter: Payer: Self-pay | Admitting: Internal Medicine

## 2021-07-20 DIAGNOSIS — F331 Major depressive disorder, recurrent, moderate: Secondary | ICD-10-CM
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
## 2021-07-21 ENCOUNTER — Other Ambulatory Visit: Payer: Self-pay

## 2021-07-21 ENCOUNTER — Emergency Department (HOSPITAL_COMMUNITY): Payer: 59

## 2021-07-21 ENCOUNTER — Encounter (HOSPITAL_COMMUNITY): Payer: Self-pay | Admitting: Emergency Medicine

## 2021-07-21 ENCOUNTER — Emergency Department (HOSPITAL_COMMUNITY)
Admission: EM | Admit: 2021-07-21 | Discharge: 2021-07-21 | Disposition: A | Discharge status: Home / Self Care | Payer: 59 | Attending: Emergency Medicine | Admitting: Emergency Medicine

## 2021-07-21 DIAGNOSIS — I82622 Acute embolism and thrombosis of deep veins of left upper extremity: Secondary | ICD-10-CM | POA: Diagnosis not present

## 2021-07-21 DIAGNOSIS — Z794 Long term (current) use of insulin: Secondary | ICD-10-CM | POA: Insufficient documentation

## 2021-07-21 DIAGNOSIS — M7989 Other specified soft tissue disorders: Secondary | ICD-10-CM | POA: Diagnosis present

## 2021-07-21 DIAGNOSIS — I8290 Acute embolism and thrombosis of unspecified vein: Secondary | ICD-10-CM

## 2021-07-21 LAB — CBC WITH DIFFERENTIAL/PLATELET
Abs Immature Granulocytes: 0.02 10*3/uL (ref 0.00–0.07)
Basophils Absolute: 0 10*3/uL (ref 0.0–0.1)
Basophils Relative: 1 %
Eosinophils Absolute: 0.1 10*3/uL (ref 0.0–0.5)
Eosinophils Relative: 1 %
HCT: 38 % (ref 36.0–46.0)
Hemoglobin: 12.1 g/dL (ref 12.0–15.0)
Immature Granulocytes: 1 %
Lymphocytes Relative: 32 %
Lymphs Abs: 1.4 10*3/uL (ref 0.7–4.0)
MCH: 30.4 pg (ref 26.0–34.0)
MCHC: 31.8 g/dL (ref 30.0–36.0)
MCV: 95.5 fL (ref 80.0–100.0)
Monocytes Absolute: 0.4 10*3/uL (ref 0.1–1.0)
Monocytes Relative: 10 %
Neutro Abs: 2.3 10*3/uL (ref 1.7–7.7)
Neutrophils Relative %: 55 %
Platelets: 325 10*3/uL (ref 150–400)
RBC: 3.98 MIL/uL (ref 3.87–5.11)
RDW: 13.7 % (ref 11.5–15.5)
WBC: 4.2 10*3/uL (ref 4.0–10.5)
nRBC: 0 % (ref 0.0–0.2)

## 2021-07-21 LAB — BASIC METABOLIC PANEL
Anion gap: 4 — ABNORMAL LOW (ref 5–15)
BUN: 10 mg/dL (ref 6–20)
CO2: 26 mmol/L (ref 22–32)
Calcium: 8.5 mg/dL — ABNORMAL LOW (ref 8.9–10.3)
Chloride: 109 mmol/L (ref 98–111)
Creatinine, Ser: 0.81 mg/dL (ref 0.44–1.00)
GFR, Estimated: >60 mL/min (ref >60)
Glucose, Bld: 92 mg/dL (ref 70–99)
Potassium: 4.3 mmol/L (ref 3.5–5.1)
Sodium: 139 mmol/L (ref 135–145)

## 2021-07-21 LAB — PROTIME-INR
INR: 1.1 (ref 0.8–1.2)
Prothrombin Time: 13.8 seconds (ref 11.4–15.2)

## 2021-07-21 MED ORDER — APIXABAN 5 MG PO TABS
10.0000 mg | ORAL_TABLET | Freq: Two times a day (BID) | ORAL | Status: DC
  Administered 2021-07-21: 10 mg via ORAL
  Filled 2021-07-21: qty 2

## 2021-07-21 MED ORDER — APIXABAN 5 MG PO TABS: ORAL_TABLET | ORAL | 0 refills | Status: DC

## 2021-07-21 NOTE — ED Triage Notes (Signed)
Pt to the ED with complaints of left hand and arm swelling.  Pt had an endo procedure on Monday and had an IV in her Left hand. Pt states her fluid were burning and the IV was pulled out to try to help the problem.   Swelling noted.

## 2021-07-21 NOTE — ED Provider Notes (Signed)
[facility] EMERGENCY DEPARTMENT Provider Note   CSN: [id number] Arrival date & time: 07/21/21  9323     History  Chief Complaint  Patient presents with   Hand Pain    Brooke [name] is a 51 y.o. female.   Hand Pain Pertinent negatives include no chest pain and no shortness of breath.        Brooke [name] is a 51 y.o. female who presents to the Emergency Department complaining of pain and swelling of her left hand and arm.  She underwent an EGD on Monday 07/17/21 here and had some pain to her dorsal hand when the IV was inserted.  Yesterday, she noticed increasing pain and swelling of her hand and now she has pain radiating up her arm to the level of her shoulder.  Pain worsens with use and movement.  She denies redness, numbness fever, chills, chest pain or shortness of breath.  No hx of bleeding/clotting disorders or prior PE or DVT    Home Medications Prior to Admission medications   Medication Sig Start Date End Date Taking? Authorizing Provider  acetaminophen (TYLENOL) 500 MG tablet Take 1,000 mg by mouth daily as needed for moderate pain or headache.    [provider]  Biotin 5000 MCG TABS Take 5,000 mcg by mouth daily.    [provider]  Cyanocobalamin (B-12 SUPER STRENGTH SL) Place 1 mL under the tongue daily.    [provider]  diclofenac Sodium (VOLTAREN) 1 % GEL Apply 1 g topically daily as needed for pain. 06/06/19   [provider]  Dulaglutide (TRULICITY) 3 MG/0.5ML SOPN Inject 3 mg into the skin once a week. 06/20/21   Langston Reusing, MD  Erenumab-aooe (AIMOVIG) 140 MG/ML SOAJ ADMINISTER 1 ML(140MG ) UNDER THE SKIN EVERY 30 DAYS 06/28/21   Butch Penny, NP  fluticasone (FLONASE) 50 MCG/ACT nasal spray SHAKE LIQUID AND USE 1 SPRAY IN EACH NOSTRIL TWICE DAILY Patient taking differently: Place 1 spray into both nostrils daily as needed for allergies. 03/30/21   Anabel Halon, MD  lamoTRIgine (LAMICTAL) 100 MG tablet  Take 1 tablet (100 mg total) by mouth daily. 07/19/21 09/17/21  [name], MD  levETIRAcetam (KEPPRA) 250 MG tablet TAKE 1 TABLET(250 MG) BY MOUTH AT BEDTIME 01/23/21   Anabel Halon, MD  levocetirizine (XYZAL) 5 MG tablet Take 1 tablet (5 mg total) by mouth every evening. Patient taking differently: Take 5 mg by mouth daily as needed for allergies. 10/14/19   Freddy Finner, NP  meclizine (ANTIVERT) 25 MG tablet TAKE 1 TABLET(25 MG) BY MOUTH THREE TIMES DAILY AS NEEDED FOR DIZZINESS 04/10/21   Anabel Halon, MD  Multiple Vitamins-Minerals (BARIATRIC MULTIVITAMINS/IRON PO) Take 1 tablet by mouth daily.     [provider]  ondansetron (ZOFRAN-ODT) 4 MG disintegrating tablet Take 1 tablet (4 mg total) by mouth every 8 (eight) hours as needed for nausea or vomiting. 01/14/21   Particia Nearing, PA-C  pantoprazole (PROTONIX) 40 MG tablet Take 1 tablet (40 mg total) by mouth 2 (two) times daily before a meal. 05/08/21   Tiffany Kocher, PA-C  propranolol (INDERAL) 60 MG tablet TAKE 1 TABLET(60 MG) BY MOUTH AT BEDTIME. PLEASE MAKE. FOLLOW UP APPOINTMENT FOR CONTINUED REFILLS 05/15/21   Dohmeier, Porfirio Mylar, MD  Prucalopride Succinate (MOTEGRITY) 2 MG TABS Take 1 tablet (2 mg total) by mouth daily. With or without food 06/28/21   Gelene Mink, NP  SUMAtriptan (IMITREX) 50 MG tablet  TAKE 1 TABLET BY MOUTH EVERY 2 HOURS AS NEEDED FOR MIGRAINE. MAY REPEAT IN 2 HOURS IF HEADACHE PERSISTS OR RECURS 12/12/20   Anabel Halon, MD      Allergies    Patient has no known allergies.    Review of Systems   Review of Systems  Constitutional:  Negative for chills and fever.  Respiratory:  Negative for chest tightness and shortness of breath.   Cardiovascular:  Negative for chest pain and leg swelling.  Gastrointestinal:  Negative for nausea and vomiting.  Musculoskeletal:  Positive for arthralgias. Negative for neck pain. Myalgias: left arm pain, pain and swelling dorsal left hand. Skin:  Negative  for color change, rash and wound.  Neurological:  Negative for weakness and numbness.    Physical Exam Updated Vital Signs BP 133/80   Pulse 61   Temp 98.5 F (36.9 C) (Oral)   Resp 16   Ht 5\' 2"  (1.575 m)   Wt 67.5 kg   LMP 09/01/2020 (Approximate)   SpO2 100%   BMI 27.23 kg/m  Physical Exam Vitals and nursing note reviewed.  Constitutional:      General: She is not in acute distress.    Appearance: Normal appearance. She is not toxic-appearing.  Cardiovascular:     Rate and Rhythm: Normal rate and regular rhythm.     Pulses: Normal pulses.  Pulmonary:     Effort: Pulmonary effort is normal. No respiratory distress.     Breath sounds: Normal breath sounds.  Chest:     Chest wall: No tenderness.  Musculoskeletal:        General: Swelling and tenderness present.     Cervical back: Normal range of motion.     Comments: Mild to moderate edema of the dorsal left hand that extends to the mid forearm.  No excessive warmth or erythema of the extremity.  No lymphangitis or ecchymosis   Skin:    General: Skin is warm.     Capillary Refill: Capillary refill takes less than 2 seconds.     Findings: No bruising or erythema.  Neurological:     General: No focal deficit present.     Mental Status: She is alert.     Sensory: No sensory deficit.     Motor: No weakness.     ED Results / Procedures / Treatments   Labs (all labs ordered are listed, but only abnormal results are displayed) Labs Reviewed  BASIC METABOLIC PANEL - Abnormal; Notable for the following components:      Result Value   Calcium 8.5 (*)    Anion gap 4 (*)    All other components within normal limits  CBC WITH DIFFERENTIAL/PLATELET  PROTIME-INR    EKG None  Radiology US Venous Img Upper Uni Left (DVT)  Result Date: 07/21/2021 CLINICAL DATA:  Wrist edema.  Recent IV. EXAM: LEFT UPPER EXTREMITY VENOUS DOPPLER ULTRASOUND TECHNIQUE: Gray-scale sonography with graded compression, as well as color Doppler  and duplex ultrasound were performed to evaluate the upper extremity deep venous system from the level of the subclavian vein and including the jugular, axillary, basilic, radial, ulnar and upper cephalic vein. Spectral Doppler was utilized to evaluate flow at rest and with distal augmentation maneuvers. COMPARISON:  None Available. FINDINGS: Contralateral Subclavian Vein: Respiratory phasicity is normal and symmetric with the symptomatic side. No evidence of thrombus. Normal compressibility. Internal Jugular Vein: No evidence of thrombus. Normal compressibility, respiratory phasicity and response to augmentation. Subclavian Vein: No evidence of thrombus.  Normal compressibility, respiratory phasicity and response to augmentation. Axillary Vein: No evidence of thrombus. Normal compressibility, respiratory phasicity and response to augmentation. Cephalic Vein: Clot seen within the cephalic vein in the distal forearm. Basilic Vein: No evidence of thrombus. Normal compressibility, respiratory phasicity and response to augmentation. Brachial Veins: No evidence of thrombus. Normal compressibility, respiratory phasicity and response to augmentation. Radial Veins: No evidence of thrombus. Normal compressibility, respiratory phasicity and response to augmentation. Ulnar Veins: No evidence of thrombus. Normal compressibility, respiratory phasicity and response to augmentation. Venous Reflux:  Not assessed Other Findings:  None visualized. IMPRESSION: Clot seen within the distal left forearm cephalic vein just above the wrist. Otherwise no evidence of left upper extremity DVT. Electronically Signed   By: Charlett Nose M.D.   On: 07/21/2021 10:38    Procedures Procedures    Medications Ordered in ED Medications - No data to display  ED Course/ Medical Decision Making/ A&P                           Medical Decision Making Patient here for evaluation of pain and swelling of her left hand after a recent IV placement.   Noticed swelling of her hand yesterday.  She has had gradually worsening pain of her hand that is now radiating into her left arm.  On exam, there is some mild to moderate dorsal edema noted of the left hand that extends to the mid forearm area.  There is no excessive warmth erythema or lymphangitis noted.  Cap refill of the fingers are is good and radial pulse is palpable.  Differential diagnosis would include thrombophlebitis, DVT, inflammatory response.    Amount and/or Complexity of Data Reviewed Labs: ordered.    Details: Labs interpreted by me, no evidence of leukocytosis.  Hemoglobin reassuring.  INR 1.1.  Chemistries without acute derangement. Radiology: ordered.    Details: Venous imaging of the upper extremity ordered, showing clot w/in the cephalic vein Discussion of management or test interpretation with external provider(s): Pt agrees to tx plan, elevation, will start on eliquis  and she will f/u with pcp for further management.  Return precautions discussed  Risk Prescription drug management.           Final Clinical Impression(s) / ED Diagnoses Final diagnoses:  Blood clot in vein    Rx / DC Orders ED Discharge Orders     None         Pauline Aus, PA-C 07/22/21 0929    Franne Forts, DO 07/24/21 2355

## 2021-07-21 NOTE — Discharge Instructions (Signed)
Your ultrasound today shows that you have a clot just above your left wrist.  You have been started on medication to thin your blood.  It is important that you take this medication as directed.  Please call your primary care provider to arrange a follow-up appointment in 1 week.  Return to the emergency department for any new or worsening symptoms.  Elevate your hand and arm when possible.

## 2021-07-24 ENCOUNTER — Ambulatory Visit (INDEPENDENT_AMBULATORY_CARE_PROVIDER_SITE_OTHER): Payer: 59 | Admitting: Family Medicine

## 2021-07-30 ENCOUNTER — Other Ambulatory Visit: Payer: Self-pay | Admitting: Internal Medicine

## 2021-08-03 ENCOUNTER — Ambulatory Visit (HOSPITAL_COMMUNITY): Payer: Self-pay | Admitting: Psychiatry

## 2021-08-09 ENCOUNTER — Encounter (INDEPENDENT_AMBULATORY_CARE_PROVIDER_SITE_OTHER): Payer: Self-pay

## 2021-08-09 ENCOUNTER — Ambulatory Visit (INDEPENDENT_AMBULATORY_CARE_PROVIDER_SITE_OTHER): Payer: Self-pay | Admitting: Internal Medicine

## 2021-08-09 ENCOUNTER — Encounter: Payer: Self-pay | Admitting: Internal Medicine

## 2021-08-09 VITALS — BP 112/72 | HR 73 | Resp 18 | Ht 62.0 in | Wt 144.0 lb

## 2021-08-09 DIAGNOSIS — I82409 Acute embolism and thrombosis of unspecified deep veins of unspecified lower extremity: Secondary | ICD-10-CM | POA: Insufficient documentation

## 2021-08-09 DIAGNOSIS — E1169 Type 2 diabetes mellitus with other specified complication: Secondary | ICD-10-CM | POA: Diagnosis not present

## 2021-08-09 DIAGNOSIS — I82622 Acute embolism and thrombosis of deep veins of left upper extremity: Secondary | ICD-10-CM

## 2021-08-09 DIAGNOSIS — Z09 Encounter for follow-up examination after completed treatment for conditions other than malignant neoplasm: Secondary | ICD-10-CM

## 2021-08-09 HISTORY — DX: Acute embolism and thrombosis of unspecified deep veins of unspecified lower extremity: I82.409

## 2021-08-09 MED ORDER — APIXABAN 5 MG PO TABS: 5.0000 mg | ORAL_TABLET | Freq: Two times a day (BID) | ORAL | 1 refills | Status: DC

## 2021-08-09 NOTE — Assessment & Plan Note (Signed)
[place] LUE reviewed - Clot seen within the distal left forearm cephalic vein just above the wrist. On Eliquis now, tolerating it well - continue for 3 months Advised to apply ice over hand area for local soreness

## 2021-08-09 NOTE — Patient Instructions (Signed)
Please continue taking Eliquis as prescribed.  Please continue to take other medications as prescribed.

## 2021-08-09 NOTE — Assessment & Plan Note (Signed)
Lab Results  Component Value Date   HGBA1C 5.0 96/92/4932    On Trulicity for weight loss benefit, followed by weight loss clinic Advised to follow diabetic diet F/u CMP and lipid panel later Diabetic eye exam: Advised to follow up with Ophthalmology for diabetic eye exam
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
## 2021-08-09 NOTE — Assessment & Plan Note (Signed)
ER chart reviewed including imaging On Eliquis for DVT of LUE

## 2021-08-10 ENCOUNTER — Encounter (HOSPITAL_COMMUNITY): Payer: Self-pay

## 2021-08-10 ENCOUNTER — Ambulatory Visit (HOSPITAL_COMMUNITY): Payer: 59 | Admitting: Psychiatry

## 2021-08-11 LAB — MICROALBUMIN / CREATININE URINE RATIO
Creatinine, Urine: 257.4 mg/dL
Microalb/Creat Ratio: 14 mg/g creat (ref 0–29)
Microalbumin, Urine: 35.4 ug/mL

## 2021-08-15 ENCOUNTER — Other Ambulatory Visit: Payer: Self-pay | Admitting: Neurology

## 2021-08-15 ENCOUNTER — Other Ambulatory Visit (INDEPENDENT_AMBULATORY_CARE_PROVIDER_SITE_OTHER): Payer: Self-pay | Admitting: Family Medicine

## 2021-08-15 DIAGNOSIS — E1165 Type 2 diabetes mellitus with hyperglycemia: Secondary | ICD-10-CM

## 2021-08-21 ENCOUNTER — Ambulatory Visit (INDEPENDENT_AMBULATORY_CARE_PROVIDER_SITE_OTHER): Payer: 59 | Admitting: Family Medicine

## 2021-08-21 ENCOUNTER — Encounter (INDEPENDENT_AMBULATORY_CARE_PROVIDER_SITE_OTHER): Payer: Self-pay | Admitting: Family Medicine

## 2021-08-21 VITALS — BP 111/68 | HR 85 | Temp 98.3°F | Ht 62.0 in | Wt 135.0 lb

## 2021-08-21 DIAGNOSIS — Z7985 Long-term (current) use of injectable non-insulin antidiabetic drugs: Secondary | ICD-10-CM | POA: Diagnosis not present

## 2021-08-21 DIAGNOSIS — E669 Obesity, unspecified: Secondary | ICD-10-CM

## 2021-08-21 DIAGNOSIS — Z6824 Body mass index (BMI) 24.0-24.9, adult: Secondary | ICD-10-CM | POA: Diagnosis not present

## 2021-08-21 DIAGNOSIS — E1165 Type 2 diabetes mellitus with hyperglycemia: Secondary | ICD-10-CM

## 2021-08-21 DIAGNOSIS — E785 Hyperlipidemia, unspecified: Secondary | ICD-10-CM

## 2021-08-21 DIAGNOSIS — E1169 Type 2 diabetes mellitus with other specified complication: Secondary | ICD-10-CM | POA: Diagnosis not present

## 2021-08-21 MED ORDER — TRULICITY 3 MG/0.5ML ~~LOC~~ SOAJ: 3.0000 mg | SUBCUTANEOUS | 0 refills | Status: DC

## 2021-08-23 ENCOUNTER — Telehealth (INDEPENDENT_AMBULATORY_CARE_PROVIDER_SITE_OTHER): Payer: Self-pay | Admitting: Family Medicine

## 2021-08-23 ENCOUNTER — Encounter (INDEPENDENT_AMBULATORY_CARE_PROVIDER_SITE_OTHER): Payer: Self-pay

## 2021-08-23 NOTE — Telephone Encounter (Signed)
Dr. Jearld Shines - Prior authorization approved for Trulicity. Effective: 08/22/2021 to 08/23/2022. Patient sent approval message via mychart.

## 2021-08-24 ENCOUNTER — Ambulatory Visit (HOSPITAL_COMMUNITY): Payer: 59 | Admitting: Psychiatry

## 2021-08-24 ENCOUNTER — Other Ambulatory Visit: Payer: Self-pay | Admitting: Psychiatry

## 2021-08-24 ENCOUNTER — Telehealth: Payer: Self-pay

## 2021-08-24 ENCOUNTER — Telehealth: Payer: Self-pay | Admitting: Orthopaedic Surgery

## 2021-08-24 DIAGNOSIS — G8929 Other chronic pain: Secondary | ICD-10-CM

## 2021-08-24 NOTE — Telephone Encounter (Signed)
pt called left message that she wanted to see if you would increase her lamotrigine she states that she takes '100mg'$  but the other day she took 1 1/2 pills and she felt better ans so she wants to know if you would increase medcation to '150mg'$   Pt was last seen on 07-19-21 and next appt 08-30-21

## 2021-08-24 NOTE — Telephone Encounter (Signed)
Pt states she needs new referral for mri

## 2021-08-24 NOTE — Telephone Encounter (Signed)
She can take lamotrigine 150 mg daily until the next visit. Please advise her not to increase anymore to avoid any serious side effect from the medication. Although I believe she has enough tab to last until the next visit, let me know if she needs any refills.

## 2021-08-25 NOTE — Telephone Encounter (Signed)
MRI has been ordered

## 2021-08-25 NOTE — Addendum Note (Signed)
Addended by: Melton Alar on: 08/25/2021 10:57 AM   Modules accepted: Orders
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
## 2021-08-28 NOTE — Telephone Encounter (Signed)
left message to see how patient was doing with the '150mg'$ . asked her for a call back.

## 2021-08-29 ENCOUNTER — Telehealth: Payer: Self-pay

## 2021-08-29 NOTE — Telephone Encounter (Signed)
pt called back from previous note she states she doing good on the '150mg'$  of the lamotrigine.  (see note from 08-24-21)

## 2021-08-29 NOTE — Telephone Encounter (Signed)
Great, thanks

## 2021-08-30 ENCOUNTER — Telehealth (INDEPENDENT_AMBULATORY_CARE_PROVIDER_SITE_OTHER): Payer: 59 | Admitting: Psychiatry

## 2021-08-30 ENCOUNTER — Encounter: Payer: Self-pay | Admitting: Psychiatry

## 2021-08-30 DIAGNOSIS — F330 Major depressive disorder, recurrent, mild: Secondary | ICD-10-CM | POA: Diagnosis not present

## 2021-08-30 DIAGNOSIS — R69 Illness, unspecified: Secondary | ICD-10-CM | POA: Diagnosis not present

## 2021-08-30 MED ORDER — LAMOTRIGINE 150 MG PO TABS: 150.0000 mg | ORAL_TABLET | Freq: Every day | ORAL | 1 refills | Status: DC

## 2021-08-31 NOTE — Progress Notes (Signed)
Chief Complaint:   OBESITY Brooke [name] is here to discuss her progress with her obesity treatment plan along with follow-up of her obesity related diagnoses. Brooke [name] is on the [employer] and states she is following her eating plan approximately 75% of the time. Brooke [name] states she is walking ? minutes 5 times per week.  Today's visit was #: 24 Starting weight: 217 lbs Starting date: 09/14/2019 Today's weight: 135 lbs Today's date: 08/21/2021 Total lbs lost to date: 82 lbs Total lbs lost since last in-office visit: 11  Interim History: Brooke [name] has been working hard and staying on plan. Has been walking more at work. 75% on [employer] plan--rest is vegetables and making sure she gets all protein in. No plans for the next few weeks except work.  Subjective:   1. Type 2 diabetes mellitus with hyperglycemia, without long-term current use of insulin (HCC) Brooke [name] is on Trulicity 3 mg. A1c 5.0 in Jan 2023 after GLP-1 (Trulicity) and weight loss.  2. Hyperlipidemia associated with type 2 diabetes mellitus ([place]) Brooke [name] is not on statin. Last LDL 113.  Assessment/Plan:   1. Type 2 diabetes mellitus with hyperglycemia, without long-term current use of insulin (HCC) We will obtain labs at next appointment. We will refill Trulicity 3 mg SubQ weekly for 1 month with 0 refills.  -Refill Dulaglutide (TRULICITY) 3 OI/7.1IW SOPN; Inject 3 mg into the skin once a week.  Dispense: 6 mL; Refill: 0  2. Hyperlipidemia associated with type 2 diabetes mellitus ([place]) We will obtain labs at next appointment. Not at goal.  3. Obesity with current BMI of 24.8 Brooke [name] is currently in the action stage of change. As such, her goal is to continue with weight loss efforts. She has agreed to the [employer].   Exercise goals: All adults should avoid inactivity. Some physical activity is better than none, and adults who participate in any amount of physical activity gain some health benefits.    Brooke [name] will  add in 10-15 min resistance training 4 times a week.  Behavioral modification strategies: increasing lean protein intake, meal planning and cooking strategies, keeping healthy foods in the home, and planning for success.  Brooke [name] has agreed to follow-up with our clinic in 10 weeks. She was informed of the importance of frequent follow-up visits to maximize her success with intensive lifestyle modifications for her multiple health conditions.   Objective:   Blood pressure 111/68, pulse 85, temperature 98.3 F (36.8 C), height '5\' 2"'$  (1.575 m), weight 135 lb (61.2 kg), last menstrual period 09/01/2020, SpO2 100 %. Body mass index is 24.69 kg/m.  General: Cooperative, alert, well developed, in no acute distress. HEENT: Conjunctivae and lids unremarkable. Cardiovascular: Regular rhythm.  Lungs: Normal work of breathing. Neurologic: No focal deficits.   Lab Results  Component Value Date   CREATININE 0.81 07/21/2021   BUN 10 07/21/2021   NA 139 07/21/2021   K 4.3 07/21/2021   CL 109 07/21/2021   CO2 26 07/21/2021   Lab Results  Component Value Date   ALT 27 01/11/2021   AST 26 01/11/2021   ALKPHOS 86 01/11/2021   BILITOT 0.3 01/11/2021   Lab Results  Component Value Date   HGBA1C 5.0 01/11/2021   HGBA1C 5.3 09/22/2020   HGBA1C 5.2 08/26/2020   HGBA1C 5.1 03/24/2020   HGBA1C 5.2 09/14/2019   Lab Results  Component Value Date   INSULIN 7.9 01/11/2021   INSULIN 5.0 09/14/2020   INSULIN 5.1 03/24/2020   INSULIN 5.9 09/14/2019  Lab Results  Component Value Date   TSH 1.390 09/14/2019   Lab Results  Component Value Date   CHOL 179 01/11/2021   HDL 56 01/11/2021   LDLCALC 113 (H) 01/11/2021   TRIG 49 01/11/2021   CHOLHDL 3.0 03/24/2020   Lab Results  Component Value Date   VD25OH 48.5 01/11/2021   VD25OH 80.1 09/14/2020   VD25OH 56.4 03/24/2020   Lab Results  Component Value Date   WBC 4.2 07/21/2021   HGB 12.1 07/21/2021   HCT 38.0 07/21/2021   MCV 95.5  07/21/2021   PLT 325 07/21/2021   Lab Results  Component Value Date   IRON 111 09/03/2019   TIBC 292 09/03/2019   FERRITIN 187 09/03/2019   Attestation Statements:   Reviewed by clinician on day of visit: allergies, medications, problem list, medical history, surgical history, family history, social history, and previous encounter notes.  I, Elnora Morrison, RMA am acting as transcriptionist for Coralie Common, MD. I have reviewed the above documentation for accuracy and completeness, and I agree with the above. - Coralie Common, MD

## 2021-09-07 ENCOUNTER — Ambulatory Visit (HOSPITAL_COMMUNITY): Payer: 59 | Admitting: Psychiatry

## 2021-09-07 ENCOUNTER — Encounter (HOSPITAL_COMMUNITY): Payer: Self-pay

## 2021-09-11 ENCOUNTER — Other Ambulatory Visit: Payer: Self-pay | Admitting: Gastroenterology

## 2021-09-11 ENCOUNTER — Other Ambulatory Visit: Payer: Self-pay | Admitting: Internal Medicine

## 2021-09-11 ENCOUNTER — Other Ambulatory Visit: Payer: Self-pay | Admitting: Neurology

## 2021-09-16 ENCOUNTER — Other Ambulatory Visit: Payer: Self-pay | Admitting: Psychiatry

## 2021-09-18 ENCOUNTER — Ambulatory Visit
Admission: RE | Admit: 2021-09-18 | Discharge: 2021-09-18 | Disposition: A | Discharge status: Home / Self Care | Payer: 59 | Source: Ambulatory Visit | Attending: Physician Assistant | Admitting: Physician Assistant

## 2021-09-18 DIAGNOSIS — G8929 Other chronic pain: Secondary | ICD-10-CM

## 2021-09-18 DIAGNOSIS — S43432A Superior glenoid labrum lesion of left shoulder, initial encounter: Secondary | ICD-10-CM | POA: Diagnosis not present

## 2021-09-18 MED ORDER — IOPAMIDOL (ISOVUE-M 200) INJECTION 41%
13.0000 mL | Freq: Once | INTRAMUSCULAR | Status: AC
  Administered 2021-09-18: 13 mL via INTRA_ARTICULAR

## 2021-09-20 ENCOUNTER — Encounter: Payer: Self-pay | Admitting: Orthopaedic Surgery

## 2021-09-20 ENCOUNTER — Ambulatory Visit (INDEPENDENT_AMBULATORY_CARE_PROVIDER_SITE_OTHER): Payer: 59 | Admitting: Orthopaedic Surgery

## 2021-09-20 DIAGNOSIS — G8929 Other chronic pain: Secondary | ICD-10-CM

## 2021-09-20 DIAGNOSIS — M7502 Adhesive capsulitis of left shoulder: Secondary | ICD-10-CM

## 2021-09-20 DIAGNOSIS — M25512 Pain in left shoulder: Secondary | ICD-10-CM
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
## 2021-09-21 ENCOUNTER — Other Ambulatory Visit: Payer: Self-pay | Admitting: Psychiatry

## 2021-09-22 ENCOUNTER — Ambulatory Visit: Payer: 59 | Admitting: Orthopedic Surgery

## 2021-09-22 ENCOUNTER — Ambulatory Visit: Payer: Self-pay

## 2021-09-22 DIAGNOSIS — M25512 Pain in left shoulder: Secondary | ICD-10-CM

## 2021-09-22 DIAGNOSIS — M7502 Adhesive capsulitis of left shoulder: Secondary | ICD-10-CM | POA: Diagnosis not present

## 2021-09-23 ENCOUNTER — Encounter: Payer: Self-pay | Admitting: Orthopedic Surgery

## 2021-09-23 MED ORDER — LIDOCAINE HCL 1 % IJ SOLN
5.0000 mL | INTRAMUSCULAR | Status: AC | PRN
  Administered 2021-09-22: 5 mL

## 2021-09-23 MED ORDER — METHYLPREDNISOLONE ACETATE 40 MG/ML IJ SUSP
40.0000 mg | INTRAMUSCULAR | Status: AC | PRN
  Administered 2021-09-22: 40 mg via INTRA_ARTICULAR

## 2021-09-23 MED ORDER — BUPIVACAINE HCL 0.5 % IJ SOLN
9.0000 mL | INTRAMUSCULAR | Status: AC | PRN
  Administered 2021-09-22: 9 mL via INTRA_ARTICULAR
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
## 2021-10-06 ENCOUNTER — Other Ambulatory Visit: Payer: Self-pay | Admitting: Internal Medicine

## 2021-10-06 DIAGNOSIS — I82622 Acute embolism and thrombosis of deep veins of left upper extremity: Secondary | ICD-10-CM

## 2021-10-10 ENCOUNTER — Other Ambulatory Visit: Payer: Self-pay | Admitting: Gastroenterology

## 2021-10-10 ENCOUNTER — Other Ambulatory Visit: Payer: Self-pay | Admitting: Psychiatry

## 2021-10-11 ENCOUNTER — Telehealth: Payer: Self-pay

## 2021-10-11 NOTE — Telephone Encounter (Signed)
FYI: Brooke [name]  Pt approved for Pantoprazole tab 40 mg tab. The request is approved from 10/11/2021 to 10/12/2022. Will give to [facility] for scan. Pt notified as well.

## 2021-10-11 NOTE — Telephone Encounter (Signed)
PA done for Pantoprazole 40 mg tab. Qty: 180 with 3 refills. Dx used: R13.10, and K21.9. waiting for Cover My Meds

## 2021-10-11 NOTE — Telephone Encounter (Signed)
Looks like needs PA. Can you check on this?

## 2021-10-11 NOTE — Telephone Encounter (Signed)
I just done a PA for this pt today for Pantoprazole. I'm waiting on Cover My meds to respond and I have sent the pt a note as well.

## 2021-10-12 NOTE — Telephone Encounter (Signed)
Pt takes Pantoprazole

## 2021-10-17 ENCOUNTER — Encounter: Payer: Self-pay | Admitting: Internal Medicine

## 2021-10-20 ENCOUNTER — Ambulatory Visit: Payer: 59 | Admitting: Orthopedic Surgery
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
## 2021-10-25 ENCOUNTER — Encounter: Payer: Self-pay | Admitting: Psychiatry

## 2021-10-25 ENCOUNTER — Telehealth (INDEPENDENT_AMBULATORY_CARE_PROVIDER_SITE_OTHER): Payer: 59 | Admitting: Psychiatry

## 2021-10-25 DIAGNOSIS — R69 Illness, unspecified: Secondary | ICD-10-CM | POA: Diagnosis not present

## 2021-10-25 DIAGNOSIS — F330 Major depressive disorder, recurrent, mild: Secondary | ICD-10-CM | POA: Diagnosis not present

## 2021-10-25 MED ORDER — LAMOTRIGINE 100 MG PO TABS: 200.0000 mg | ORAL_TABLET | Freq: Every day | ORAL | 1 refills | Status: DC

## 2021-10-25 NOTE — Patient Instructions (Signed)
Increase lamotrigine 200 mg daily Next appointment: 12/20 at 11:30

## 2021-10-26 ENCOUNTER — Other Ambulatory Visit (INDEPENDENT_AMBULATORY_CARE_PROVIDER_SITE_OTHER): Payer: Self-pay | Admitting: Family Medicine

## 2021-10-26 ENCOUNTER — Other Ambulatory Visit: Payer: Self-pay | Admitting: Neurology

## 2021-10-26 DIAGNOSIS — E1165 Type 2 diabetes mellitus with hyperglycemia: Secondary | ICD-10-CM

## 2021-10-27 ENCOUNTER — Ambulatory Visit: Payer: 59 | Admitting: Orthopedic Surgery

## 2021-10-30 ENCOUNTER — Encounter (INDEPENDENT_AMBULATORY_CARE_PROVIDER_SITE_OTHER): Payer: Self-pay

## 2021-10-30 ENCOUNTER — Ambulatory Visit (INDEPENDENT_AMBULATORY_CARE_PROVIDER_SITE_OTHER): Payer: 59 | Admitting: Family Medicine

## 2021-11-03 ENCOUNTER — Encounter (HOSPITAL_COMMUNITY): Payer: Self-pay | Admitting: Internal Medicine

## 2021-11-06 ENCOUNTER — Encounter (INDEPENDENT_AMBULATORY_CARE_PROVIDER_SITE_OTHER): Payer: Self-pay | Admitting: Family Medicine

## 2021-11-06 ENCOUNTER — Ambulatory Visit (INDEPENDENT_AMBULATORY_CARE_PROVIDER_SITE_OTHER): Payer: 59 | Admitting: Family Medicine

## 2021-11-06 VITALS — BP 112/76 | HR 62 | Temp 97.9°F | Ht 62.0 in | Wt 134.0 lb

## 2021-11-06 DIAGNOSIS — E559 Vitamin D deficiency, unspecified: Secondary | ICD-10-CM

## 2021-11-06 DIAGNOSIS — E669 Obesity, unspecified: Secondary | ICD-10-CM | POA: Diagnosis not present

## 2021-11-06 DIAGNOSIS — Z6824 Body mass index (BMI) 24.0-24.9, adult: Secondary | ICD-10-CM | POA: Diagnosis not present

## 2021-11-06 DIAGNOSIS — E1165 Type 2 diabetes mellitus with hyperglycemia: Secondary | ICD-10-CM | POA: Diagnosis not present

## 2021-11-06 DIAGNOSIS — Z7985 Long-term (current) use of injectable non-insulin antidiabetic drugs: Secondary | ICD-10-CM

## 2021-11-06 MED ORDER — TRULICITY 3 MG/0.5ML ~~LOC~~ SOAJ: 3.0000 mg | SUBCUTANEOUS | 0 refills | Status: DC

## 2021-11-09 ENCOUNTER — Other Ambulatory Visit: Payer: Self-pay

## 2021-11-09 ENCOUNTER — Encounter: Payer: Self-pay | Admitting: Emergency Medicine

## 2021-11-09 ENCOUNTER — Ambulatory Visit
Admission: EM | Admit: 2021-11-09 | Discharge: 2021-11-09 | Disposition: A | Discharge status: Home / Self Care | Payer: 59 | Attending: Family Medicine | Admitting: Family Medicine

## 2021-11-09 DIAGNOSIS — J069 Acute upper respiratory infection, unspecified: Secondary | ICD-10-CM | POA: Insufficient documentation

## 2021-11-09 DIAGNOSIS — Z1152 Encounter for screening for COVID-19: Secondary | ICD-10-CM | POA: Insufficient documentation

## 2021-11-09 LAB — RESP PANEL BY RT-PCR (FLU A&B, COVID) ARPGX2
Influenza A by PCR: NEGATIVE
Influenza B by PCR: NEGATIVE
SARS Coronavirus 2 by RT PCR: NEGATIVE

## 2021-11-09 MED ORDER — PROMETHAZINE-DM 6.25-15 MG/5ML PO SYRP: 5.0000 mL | ORAL_SOLUTION | Freq: Four times a day (QID) | ORAL | 0 refills | Status: DC | PRN

## 2021-11-09 MED ORDER — FLUTICASONE PROPIONATE 50 MCG/ACT NA SUSP: 1.0000 | Freq: Two times a day (BID) | NASAL | 2 refills | Status: DC

## 2021-11-09 NOTE — ED Triage Notes (Addendum)
Pt reports sore throat, bilateral ear pain, headache, cough, body aches, and intermittent nausea and diarrhea x2 days. Has taken otc medication and allergy medication with no change in symptoms.
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
## 2021-11-10 ENCOUNTER — Ambulatory Visit: Payer: 59 | Admitting: Orthopedic Surgery

## 2021-11-11 ENCOUNTER — Other Ambulatory Visit: Payer: Self-pay | Admitting: Psychiatry

## 2021-11-14 NOTE — Progress Notes (Signed)
Chief Complaint:   OBESITY Brooke [name] is here to discuss her progress with her obesity treatment plan along with follow-up of her obesity related diagnoses. Brooke [name] is on the [employer] and states she is following her eating plan approximately 75% of the time. Brooke [name] states she is walking for 60+ minutes 3 times per week.    Today's visit was #: 25 Starting weight: 217 lbs Starting date: 09/14/2019 Today's weight: 134 lbs Today's date: 11/06/2021 Total lbs lost to date: 34 Total lbs lost since last in-office visit: 1  Interim History: Brooke [name] is doing well with maintaining her weight loss. She is meeting her protein goals and she is using protein shakes and bars, but her hunger is controlled. She follow a Pescatarian plan, but she eats poultry sometimes.   Subjective:   1. Type 2 diabetes mellitus with hyperglycemia, without long-term current use of insulin ([place]) Brooke [name] is stable on Trulicity. Her last A1c was well controlled. She denies nausea or vomiting.   2. Vitamin D deficiency Brooke [name] is on Vitamin D, and she is due for labs soon. Her last Vitamin D level was close to goal.   Assessment/Plan:   1. Type 2 diabetes mellitus with hyperglycemia, without long-term current use of insulin ([place]) Brooke [name] will continue Trulicity 3 mg once weekly, and we will refill for 90 days. We will recheck labs at her next visit.   - Dulaglutide (TRULICITY) 3 [place]/7.0WC SOPN; Inject 3 mg into the skin once a week.  Dispense: 6 mL; Refill: 0  2. Vitamin D deficiency We will recheck labs at Brooke [name]'s next visit. She will follow-up for routine testing of Vitamin D, at least 2-3 times per year to avoid over-replacement.  3. Obesity with current BMI of 24.5 Brooke [name] is currently in the action stage of change. As such, her goal is to continue with weight loss efforts. She has agreed to the [employer].   We will recheck fasting labs at her next visit.   Exercise goals: As is.   Behavioral  modification strategies: holiday eating strategies .  Brooke [name] has agreed to follow-up with our clinic in 2 months. She was informed of the importance of frequent follow-up visits to maximize her success with intensive lifestyle modifications for her multiple health conditions.   Objective:   Blood pressure 112/76, pulse 62, temperature 97.9 F (36.6 C), height '5\' 2"'$  (1.575 m), weight 134 lb (60.8 kg), last menstrual period 09/01/2020, SpO2 99 %. Body mass index is 24.51 kg/m.  General: Cooperative, alert, well developed, in no acute distress. HEENT: Conjunctivae and lids unremarkable. Cardiovascular: Regular rhythm.  Lungs: Normal work of breathing. Neurologic: No focal deficits.   Lab Results  Component Value Date   CREATININE 0.81 07/21/2021   BUN 10 07/21/2021   NA 139 07/21/2021   K 4.3 07/21/2021   CL 109 07/21/2021   CO2 26 07/21/2021   Lab Results  Component Value Date   ALT 27 01/11/2021   AST 26 01/11/2021   ALKPHOS 86 01/11/2021   BILITOT 0.3 01/11/2021   Lab Results  Component Value Date   HGBA1C 5.0 01/11/2021   HGBA1C 5.3 09/22/2020   HGBA1C 5.2 08/26/2020   HGBA1C 5.1 03/24/2020   HGBA1C 5.2 09/14/2019   Lab Results  Component Value Date   INSULIN 7.9 01/11/2021   INSULIN 5.0 09/14/2020   INSULIN 5.1 03/24/2020   INSULIN 5.9 09/14/2019   Lab Results  Component Value Date   TSH 1.390 09/14/2019   Lab  Results  Component Value Date   CHOL 179 01/11/2021   HDL 56 01/11/2021   LDLCALC 113 (H) 01/11/2021   TRIG 49 01/11/2021   CHOLHDL 3.0 03/24/2020   Lab Results  Component Value Date   VD25OH 48.5 01/11/2021   VD25OH 80.1 09/14/2020   VD25OH 56.4 03/24/2020   Lab Results  Component Value Date   WBC 4.2 07/21/2021   HGB 12.1 07/21/2021   HCT 38.0 07/21/2021   MCV 95.5 07/21/2021   PLT 325 07/21/2021   Lab Results  Component Value Date   IRON 111 09/03/2019   TIBC 292 09/03/2019   FERRITIN 187 09/03/2019   Attestation Statements:    Reviewed by clinician on day of visit: allergies, medications, problem list, medical history, surgical history, family history, social history, and previous encounter notes.   I, Trixie Dredge, am acting as transcriptionist for Dennard Nip, MD.  I have reviewed the above documentation for accuracy and completeness, and I agree with the above. -  Dennard Nip, MD

## 2021-11-17 ENCOUNTER — Telehealth: Payer: Self-pay

## 2021-11-17 NOTE — Telephone Encounter (Signed)
pt called she states that her medication was increase but she is still having panic attacks during the day. she wants to know if she can try something else or if something can be added.   Pt was last seen on 10-25 next appt 12-21-22

## 2021-11-18 NOTE — Telephone Encounter (Signed)
Unfortunately she has tried many other medication, which caused side effect. Other medication can cause weight gain. If she is interested in gabapentin for anxiety, I will send in the medication.  Side effects including drowsiness. Let me know if she is interested in this. I would strongly recommend her seeing a therapist.

## 2021-11-20 NOTE — Telephone Encounter (Signed)
left message to call our office back.

## 2021-11-21 ENCOUNTER — Other Ambulatory Visit: Payer: Self-pay | Admitting: Psychiatry

## 2021-11-21 ENCOUNTER — Telehealth: Payer: Self-pay

## 2021-11-21 ENCOUNTER — Other Ambulatory Visit: Payer: Self-pay | Admitting: Internal Medicine

## 2021-11-21 MED ORDER — TRAZODONE HCL 50 MG PO TABS: 25.0000 mg | ORAL_TABLET | Freq: Every evening | ORAL | 0 refills | Status: DC | PRN

## 2021-11-21 NOTE — Telephone Encounter (Signed)
pt called she states she stilll not sleeping is there anything she can get that will help her sleep.

## 2021-11-21 NOTE — Telephone Encounter (Signed)
Ordered

## 2021-11-21 NOTE — Telephone Encounter (Signed)
pt states to go ahead and send rx. she states she try anthing to sleep

## 2021-11-22 NOTE — Telephone Encounter (Signed)
Tc on 11-22-21 @ 4:31 no answer no message could be left

## 2021-11-25 ENCOUNTER — Other Ambulatory Visit: Payer: Self-pay | Admitting: Neurology

## 2021-12-01 ENCOUNTER — Ambulatory Visit: Payer: 59 | Admitting: Adult Health

## 2021-12-13 ENCOUNTER — Other Ambulatory Visit: Payer: Self-pay | Admitting: Psychiatry
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
## 2021-12-20 ENCOUNTER — Encounter: Payer: Self-pay | Admitting: Psychiatry

## 2021-12-20 ENCOUNTER — Telehealth (INDEPENDENT_AMBULATORY_CARE_PROVIDER_SITE_OTHER): Payer: 59 | Admitting: Psychiatry

## 2021-12-20 DIAGNOSIS — F411 Generalized anxiety disorder: Secondary | ICD-10-CM

## 2021-12-20 DIAGNOSIS — G4700 Insomnia, unspecified: Secondary | ICD-10-CM | POA: Diagnosis not present

## 2021-12-20 DIAGNOSIS — F330 Major depressive disorder, recurrent, mild: Secondary | ICD-10-CM

## 2021-12-20 DIAGNOSIS — R69 Illness, unspecified: Secondary | ICD-10-CM | POA: Diagnosis not present

## 2021-12-20 MED ORDER — LAMOTRIGINE 100 MG PO TABS: 200.0000 mg | ORAL_TABLET | Freq: Every day | ORAL | 1 refills | Status: DC

## 2021-12-20 MED ORDER — TRAZODONE HCL 50 MG PO TABS: 25.0000 mg | ORAL_TABLET | Freq: Every evening | ORAL | 1 refills | Status: DC | PRN

## 2021-12-20 NOTE — Patient Instructions (Signed)
Continue lamotrigine 200 mg daily  Decrease trazodone 25 mg at night as needed for insomnia Obtain EKG Next appointment: 2/21 at 11 AM

## 2021-12-21 ENCOUNTER — Other Ambulatory Visit: Payer: Self-pay | Admitting: Neurology

## 2021-12-25 ENCOUNTER — Other Ambulatory Visit: Payer: Self-pay | Admitting: Neurology

## 2021-12-26 ENCOUNTER — Other Ambulatory Visit: Payer: Self-pay | Admitting: Psychiatry

## 2021-12-26 ENCOUNTER — Telehealth (INDEPENDENT_AMBULATORY_CARE_PROVIDER_SITE_OTHER): Payer: Self-pay | Admitting: Family Medicine

## 2021-12-26 NOTE — Telephone Encounter (Signed)
Pt called 12/26/2021 She said she has been out of her Trulicity at least 56month the '3mg'$  is on back order but they do have the middle and lowest dose in stock.she is not sure what Dr BLeafy Rowant's her to do

## 2021-12-26 NOTE — Telephone Encounter (Signed)
Pt notified by message

## 2022-01-02 ENCOUNTER — Other Ambulatory Visit: Payer: Self-pay | Admitting: Neurology

## 2022-01-08 ENCOUNTER — Telehealth: Payer: Self-pay

## 2022-01-08 NOTE — Telephone Encounter (Signed)
pt called states that the medication is not working her anxiety and panic attack more often and lasting longer, and the trazodone is still not working she still not sleeping. pt also needs to know about what neurologist you sending her too.

## 2022-01-08 NOTE — Telephone Encounter (Signed)
I would advise she contact her neurologist,  Dr. Brett Fairy for evaluation of sleep apnea. I would hold adjusting her sleep medication until this evaluation. Regarding anxiety- if she is interested in trying gabapentin, that could be another option. Let me know if she is interested in this medication.

## 2022-01-09 ENCOUNTER — Other Ambulatory Visit: Payer: Self-pay | Admitting: Psychiatry

## 2022-01-09 DIAGNOSIS — G4700 Insomnia, unspecified: Secondary | ICD-10-CM

## 2022-01-09 MED ORDER — GABAPENTIN 100 MG PO CAPS: 100.0000 mg | ORAL_CAPSULE | Freq: Three times a day (TID) | ORAL | 1 refills | Status: DC

## 2022-01-09 NOTE — Telephone Encounter (Signed)
pt notified of rx and about the referral.

## 2022-01-09 NOTE — Telephone Encounter (Signed)
It was ordered.

## 2022-01-09 NOTE — Telephone Encounter (Signed)
pt states that she will try the gabapentin. please send that in. she also states she will need a referral for the neurologist for the evaluation. they told her that they would need one since she has not been seen within the year.

## 2022-01-09 NOTE — Telephone Encounter (Signed)
Pt was already given information about medication.

## 2022-01-09 NOTE — Telephone Encounter (Signed)
-   ordered gabapentin 100 mg three times a day. Hold if any drowsiness.  - ordered referral for evaluation of sleep apnea, Dx insomnia.

## 2022-01-12 ENCOUNTER — Other Ambulatory Visit: Payer: Self-pay | Admitting: Psychiatry

## 2022-01-13 ENCOUNTER — Other Ambulatory Visit: Payer: Self-pay | Admitting: Family Medicine

## 2022-01-15 NOTE — Telephone Encounter (Signed)
Urgent Care Patient Requested Prescriptions  Pending Prescriptions Disp Refills   fluticasone (FLONASE) 50 MCG/ACT nasal spray [Pharmacy Med Name: FLUTICASONE PROP 50 MCG SPRAY] 16 mL 2    Sig: SHAKE LIQUID AND USE 1 SPRAY IN EACH NOSTRIL TWICE A DAY     There is no refill protocol information for this order      

## 2022-01-16 ENCOUNTER — Ambulatory Visit: Payer: 59 | Admitting: Adult Health

## 2022-01-16 ENCOUNTER — Encounter (INDEPENDENT_AMBULATORY_CARE_PROVIDER_SITE_OTHER): Payer: Self-pay | Admitting: Family Medicine

## 2022-01-16 ENCOUNTER — Ambulatory Visit (INDEPENDENT_AMBULATORY_CARE_PROVIDER_SITE_OTHER): Payer: 59 | Admitting: Family Medicine

## 2022-01-16 VITALS — BP 134/85 | HR 63 | Temp 97.8°F | Ht 62.0 in | Wt 141.0 lb

## 2022-01-16 DIAGNOSIS — E1165 Type 2 diabetes mellitus with hyperglycemia: Secondary | ICD-10-CM | POA: Diagnosis not present

## 2022-01-16 DIAGNOSIS — E559 Vitamin D deficiency, unspecified: Secondary | ICD-10-CM | POA: Diagnosis not present

## 2022-01-16 DIAGNOSIS — E7800 Pure hypercholesterolemia, unspecified: Secondary | ICD-10-CM

## 2022-01-16 DIAGNOSIS — Z6825 Body mass index (BMI) 25.0-25.9, adult: Secondary | ICD-10-CM | POA: Diagnosis not present

## 2022-01-16 DIAGNOSIS — Z7985 Long-term (current) use of injectable non-insulin antidiabetic drugs: Secondary | ICD-10-CM

## 2022-01-16 DIAGNOSIS — E669 Obesity, unspecified: Secondary | ICD-10-CM

## 2022-01-16 DIAGNOSIS — Z6839 Body mass index (BMI) 39.0-39.9, adult: Secondary | ICD-10-CM

## 2022-01-16 MED ORDER — TRULICITY 3 MG/0.5ML ~~LOC~~ SOAJ: 3.0000 mg | SUBCUTANEOUS | 0 refills | Status: DC

## 2022-01-17 ENCOUNTER — Ambulatory Visit: Payer: Self-pay | Admitting: Gastroenterology

## 2022-01-17 LAB — CMP14+EGFR
ALT: 45 IU/L — ABNORMAL HIGH (ref 0–32)
AST: 29 IU/L (ref 0–40)
Albumin/Globulin Ratio: 1.8 (ref 1.2–2.2)
Albumin: 4.1 g/dL (ref 3.8–4.9)
Alkaline Phosphatase: 79 IU/L (ref 44–121)
BUN/Creatinine Ratio: 12 (ref 9–23)
BUN: 11 mg/dL (ref 6–24)
Bilirubin Total: 0.3 mg/dL (ref 0.0–1.2)
CO2: 24 mmol/L (ref 20–29)
Calcium: 8.6 mg/dL — ABNORMAL LOW (ref 8.7–10.2)
Chloride: 104 mmol/L (ref 96–106)
Creatinine, Ser: 0.9 mg/dL (ref 0.57–1.00)
Globulin, Total: 2.3 g/dL (ref 1.5–4.5)
Glucose: 78 mg/dL (ref 70–99)
Potassium: 4.7 mmol/L (ref 3.5–5.2)
Sodium: 140 mmol/L (ref 134–144)
Total Protein: 6.4 g/dL (ref 6.0–8.5)
eGFR: 77 mL/min/{1.73_m2} (ref >59)

## 2022-01-17 LAB — LIPID PANEL WITH LDL/HDL RATIO
Cholesterol, Total: 194 mg/dL (ref 100–199)
HDL: 71 mg/dL (ref >39)
LDL Chol Calc (NIH): 113 mg/dL — ABNORMAL HIGH (ref 0–99)
LDL/HDL Ratio: 1.6 ratio (ref 0.0–3.2)
Triglycerides: 54 mg/dL (ref 0–149)
VLDL Cholesterol Cal: 10 mg/dL (ref 5–40)

## 2022-01-17 LAB — TSH: TSH: 2.17 u[IU]/mL (ref 0.450–4.500)

## 2022-01-17 LAB — VITAMIN D 25 HYDROXY (VIT D DEFICIENCY, FRACTURES): Vit D, 25-Hydroxy: 41.5 ng/mL (ref 30.0–100.0)

## 2022-01-17 LAB — HEMOGLOBIN A1C
Est. average glucose Bld gHb Est-mCnc: 100 mg/dL
Hgb A1c MFr Bld: 5.1 % (ref 4.8–5.6)

## 2022-01-17 LAB — INSULIN, RANDOM: INSULIN: 4.8 u[IU]/mL (ref 2.6–24.9)

## 2022-01-18 ENCOUNTER — Other Ambulatory Visit: Payer: Self-pay | Admitting: Psychiatry

## 2022-01-22 ENCOUNTER — Other Ambulatory Visit: Payer: Self-pay | Admitting: Neurology

## 2022-01-23 ENCOUNTER — Ambulatory Visit (INDEPENDENT_AMBULATORY_CARE_PROVIDER_SITE_OTHER): Payer: 59 | Admitting: Gastroenterology

## 2022-01-23 ENCOUNTER — Encounter: Payer: Self-pay | Admitting: Gastroenterology

## 2022-01-23 ENCOUNTER — Ambulatory Visit (INDEPENDENT_AMBULATORY_CARE_PROVIDER_SITE_OTHER): Payer: 59

## 2022-01-23 ENCOUNTER — Other Ambulatory Visit: Payer: Self-pay | Admitting: Gastroenterology

## 2022-01-23 VITALS — BP 116/78 | HR 65 | Temp 98.1°F | Ht 62.0 in | Wt 143.2 lb

## 2022-01-23 DIAGNOSIS — K760 Fatty (change of) liver, not elsewhere classified: Secondary | ICD-10-CM | POA: Diagnosis not present

## 2022-01-23 DIAGNOSIS — Z23 Encounter for immunization: Secondary | ICD-10-CM | POA: Diagnosis not present

## 2022-01-23 DIAGNOSIS — K219 Gastro-esophageal reflux disease without esophagitis: Secondary | ICD-10-CM

## 2022-01-23 DIAGNOSIS — R1319 Other dysphagia: Secondary | ICD-10-CM

## 2022-01-23 DIAGNOSIS — K5900 Constipation, unspecified: Secondary | ICD-10-CM

## 2022-01-23 MED ORDER — MOTEGRITY 2 MG PO TABS: 2.0000 mg | ORAL_TABLET | Freq: Every day | ORAL | 5 refills | Status: DC

## 2022-01-23 NOTE — Patient Instructions (Addendum)
Start Motegrity '2mg'$  daily. Please let me know if you have an issues obtaining medication.  Continue pantoprazole '40mg'$  twice daily. Xray of esophagus to be scheduled.  Try to limit foods in the list below as they may increase bloating and gas.   Foods to avoid Fruits Fresh, dried, and juiced forms of apple, pear, watermelon, peach, plum, cherries, apricots, blackberries, boysenberries, figs, nectarines, and mango. Avocado. Vegetables Chicory root, artichoke, asparagus, cabbage, snow peas, Brussels sprouts, broccoli, sugar snap peas, mushrooms, celery, and cauliflower. Onions, garlic, leeks, and the white part of scallions. Grains Wheat, including kamut, durum, and semolina. Barley and bulgur. Couscous. Wheat-based cereals. Wheat noodles, bread, crackers, and pastries. Meats and other proteins Fried or fatty meat. Sausage. Cashews and pistachios. Soybeans, baked beans, black beans, chickpeas, kidney beans, fava beans, navy beans, lentils, black-eyed peas, and split peas. Dairy Milk, yogurt, ice cream, and soft cheese. Cream and sour cream. Milk-based sauces. Custard. Buttermilk. Soy milk. Seasoning and other foods Any sugar-free gum or candy. Foods that contain artificial sweeteners such as sorbitol, mannitol, isomalt, or xylitol. Foods that contain honey, high-fructose corn syrup, or agave. Bouillon, vegetable stock, beef stock, and chicken stock. Garlic and onion powder. Condiments made with onion, such as hummus, chutney, pickles, relish, salad dressing, and salsa. Tomato paste. Beverages Chicory-based drinks. Coffee substitutes. Chamomile tea. Fennel tea. Sweet or fortified wines such as port or sherry. Diet soft drinks made with isomalt, mannitol, maltitol, sorbitol, or xylitol. Apple, pear, and mango juice. Juices with high-fructose corn syrup. The items listed above may not be a complete list of foods and beverages you should avoid. Contact a dietitian for more information.   It was a  pleasure to see you today. I want to create trusting relationships with patients and provide genuine, compassionate, and quality care. I truly value your feedback, so please be on the lookout for a survey regarding your visit with me today. I appreciate your time in completing this!

## 2022-01-23 NOTE — Progress Notes (Signed)
GI Office Note    Referring Provider: Lindell Spar, MD Primary Care Physician:  Lindell Spar, MD  Primary Gastroenterologist: Garfield Cornea, MD   Chief Complaint   Chief Complaint  Patient presents with   Follow-up    States that it feels like food is still getting stuck in her chest. Also is still having issues with constipation and being lactose intolerant.     History of Present Illness   Brooke [name] is a 52 y.o. female presenting today for follow-up.  Last seen in the office in June 2023.  History of GERD, constipation, dysphagia, fatty liver, gastric bypass in 2019.  Due for screening colonoscopy 2024.  Continues to have dysphagia. Did not feel like dilation helped. Difficulty with liquids, pills, chicken. Has pain in the esophagus when this happens. Swallowing multiple times to help stuff go down. Heartburn fairly well controlled on pantoprazole BID. Occasional mylanta.No vomiting. Still having a lot of issues with constipation. Rarely has spontaneous BM although recently coffee has helped a little. Generally will take MOM when needed but tries not to take more than once per week. She will have a BM but does not feel complete. No melena, brbpr. Drinks about 56 ounces of water daily.  Eats a lot of greens.   Has tried Linzess, Amitiza, Trulance, MiraLAX.  Milk of magnesia helps couple of days after taking it.  Complains of bloating with fiber.  EGD 07/2021: -s/p prior gastric bypass -normal esophagus s/p dilation, small tear noted through UES -esophageal biopsies unremarkable   Medications   Current Outpatient Medications  Medication Sig Dispense Refill   acetaminophen (TYLENOL) 500 MG tablet Take 1,000 mg by mouth daily as needed for moderate pain or headache.     Biotin 5000 MCG TABS Take 5,000 mcg by mouth daily.     Dulaglutide (TRULICITY) 3 ZJ/6.9CV SOPN Inject 3 mg into the skin once a week. 6 mL 0   fluticasone (FLONASE) 50 MCG/ACT nasal spray SHAKE  LIQUID AND USE 1 SPRAY IN EACH NOSTRIL TWICE DAILY (Patient taking differently: Place 1 spray into both nostrils daily as needed for allergies.) 48 g 1   gabapentin (NEURONTIN) 100 MG capsule Take 1 capsule (100 mg total) by mouth 3 (three) times daily. 90 capsule 1   lamoTRIgine (LAMICTAL) 100 MG tablet Take 2 tablets (200 mg total) by mouth daily. 60 tablet 1   levETIRAcetam (KEPPRA) 250 MG tablet TAKE 1 TABLET BY MOUTH EVERYDAY AT BEDTIME 30 tablet 2   levocetirizine (XYZAL) 5 MG tablet Take 1 tablet (5 mg total) by mouth every evening. (Patient taking differently: Take 5 mg by mouth daily as needed for allergies.) 30 tablet 2   meclizine (ANTIVERT) 25 MG tablet TAKE 1 TABLET(25 MG) BY MOUTH THREE TIMES DAILY AS NEEDED FOR DIZZINESS 30 tablet 1   Multiple Vitamins-Minerals (BARIATRIC MULTIVITAMINS/IRON PO) Take 1 tablet by mouth daily.      ondansetron (ZOFRAN-ODT) 4 MG disintegrating tablet Take 1 tablet (4 mg total) by mouth every 8 (eight) hours as needed for nausea or vomiting. 20 tablet 0   pantoprazole (PROTONIX) 40 MG tablet TAKE 1 TABLET BY MOUTH TWICE A DAY BEFORE A MEAL 180 tablet 3   propranolol (INDERAL) 60 MG tablet TAKE 1 TABLET (60 MG TOTAL) BY MOUTH AT BEDTIME. PLEASE CALL OFFICE FOR APPOINTMENT. LAST REFILL. 30 tablet 0   SUMAtriptan (IMITREX) 50 MG tablet TAKE 1 TABLET BY MOUTH EVERY 2 HOURS AS NEEDED FOR MIGRAINE. MAY  REPEAT IN 2 HOURS IF HEADACHE PERSISTS OR RECURS 10 tablet 1   No current facility-administered medications for this visit.   Facility-Administered Medications Ordered in Other Visits  Medication Dose Route Frequency Provider Last Rate Last Admin   hemostatic agents (no charge) Optime    PRN Janyth Pupa, DO   1 application  at 07/01/14 1115    Allergies   Allergies as of 01/23/2022   (No Known Allergies)        Review of Systems   General: Negative for anorexia, weight loss, fever, chills, fatigue, weakness. ENT: Negative for hoarseness, difficulty  swallowing , nasal congestion. CV: Negative for chest pain, angina, palpitations, dyspnea on exertion, peripheral edema.  Respiratory: Negative for dyspnea at rest, dyspnea on exertion, cough, sputum, wheezing.  GI: See history of present illness. GU:  Negative for dysuria, hematuria, urinary incontinence, urinary frequency, nocturnal urination.  Endo: Negative for unusual weight change.     Physical Exam   BP 116/78 (BP Location: Right Arm, Patient Position: Sitting, Cuff Size: Normal)   Pulse 65   Temp 98.1 F (36.7 C) (Oral)   Ht '5\' 2"'$  (1.575 m)   Wt 143 lb 3.2 oz (65 kg)   LMP 09/01/2020 (Approximate)   SpO2 100%   BMI 26.19 kg/m    General: Well-nourished, well-developed in no acute distress.  Eyes: No icterus. Mouth: Oropharyngeal mucosa moist and pink  Abdomen: Bowel sounds are normal, nontender, nondistended, no hepatosplenomegaly or masses,  no abdominal bruits or hernia , no rebound or guarding.  Rectal: not performed Extremities: No lower extremity edema. No clubbing or deformities. Neuro: Alert and oriented x 4   Skin: Warm and dry, no jaundice.   Psych: Alert and cooperative, normal mood and affect.  Labs   Lab Results  Component Value Date   TSH 2.170 01/16/2022   Lab Results  Component Value Date   HGBA1C 5.1 01/16/2022   Lab Results  Component Value Date   CREATININE 0.90 01/16/2022   BUN 11 01/16/2022   NA 140 01/16/2022   K 4.7 01/16/2022   CL 104 01/16/2022   CO2 24 01/16/2022   Lab Results  Component Value Date   ALT 45 (H) 01/16/2022   AST 29 01/16/2022   ALKPHOS 79 01/16/2022   BILITOT 0.3 01/16/2022    Imaging Studies   No results found.  Assessment   Fatty liver: last imaging in 2021. Most recently labs with mildly elevated ALT. Extensive work up in the past. She has dropped about 100 pounds since her gastric bypass. Continue to eat healthy, daily exercise recommended. Continue to monitor LFTs.   GERD: doing well on PPI BID.  Reinforced antireflux measures.   Dysphagia: no notable improvement with esophageal dilation. Noted to have some disruption of the UES with dilation. Will look at esophagus with barium study. She may have underlying esophageal motility issues.   Constipation: poorly controlled. Has failed multiple agents as outlined above. She has new insurance this year, we will try to see if Motegrity is an option for her. If not, may try lactulose. Limit high fodmap foods due to bloating/gas.    PLAN   Motegrity '2mg'$  daily.  Continue pantoprazole '40mg'$  BID. BPE. Limit high fodmap foods.  Continue to monitor LFTs with yearly labs at time of physical.    Laureen Ochs. Bobby Rumpf, [place], [facility]

## 2022-01-23 NOTE — Progress Notes (Unsigned)
Chief Complaint:   OBESITY Brooke [name] is here to discuss her progress with her obesity treatment plan along with follow-up of her obesity related diagnoses. Brooke [name] is on the [employer] and states she is following her eating plan approximately 50% of the time. Brooke [name] states she is walking for 30-45 minutes 2 times per week.  Today's visit was #: 70 Starting weight: 214 lbs Starting date: 09/14/2019 Today's weight: 141 lbs Today's date: 01/16/2022 Total lbs lost to date: 76 Total lbs lost since last in-office visit: 0  Interim History: Brooke [name] did some celebration eating over the holidays and she is working on getting back on track.  She notes increased sweet cravings after indulging more over the holidays.  Subjective:   1. Vitamin D deficiency Brooke [name] is on vitamin D, and she is due for labs.  2. Pure hypercholesterolemia Brooke [name]'s last LDL was above goal and she has a history of diabetes mellitus.  She is not on a statin.  3. Type 2 diabetes mellitus with hyperglycemia, without long-term current use of insulin (HCC) Brooke [name] is doing well on Trulicity.  She requests a refill today.  She denies nausea, vomiting, or hypoglycemia.  Assessment/Plan:   1. Vitamin D deficiency We will check labs today, and we will follow-up at Brooke [name]'s next visit.  - VITAMIN D 25 Hydroxy (Vit-D Deficiency, Fractures)  2. Pure hypercholesterolemia We will check labs today. Brooke [name] will continue with her diet and exercise.   - Lipid Panel With LDL/HDL Ratio - TSH  3. Type 2 diabetes mellitus with hyperglycemia, without long-term current use of insulin (HCC) We will check labs today. Brooke [name] will continue Trulicity, and we will refill for 90 days.  - Dulaglutide (TRULICITY) 3 ZO/1.0RU SOPN; Inject 3 mg into the skin once a week.  Dispense: 6 mL; Refill: 0 - CMP14+EGFR - Insulin, random - Hemoglobin A1c  4. Obesity, Current BMI 25.9 Brooke [name] is currently in the action stage of change. As such,  her goal is to continue with weight loss efforts. She has agreed to the [employer].   Exercise goals: As is, add wall pilates for 15 minutes beginner video.   Behavioral modification strategies: increasing lean protein intake, better snacking choices, and emotional eating strategies.  Brooke [name] has agreed to follow-up with our clinic in 8 weeks. She was informed of the importance of frequent follow-up visits to maximize her success with intensive lifestyle modifications for her multiple health conditions.   Brooke [name] was informed we would discuss her lab results at her next visit unless there is a critical issue that needs to be addressed sooner. Brooke [name] agreed to keep her next visit at the agreed upon time to discuss these results.  Objective:   Blood pressure 134/85, pulse 63, temperature 97.8 F (36.6 C), height '5\' 2"'$  (1.575 m), weight 141 lb (64 kg), last menstrual period 09/01/2020, SpO2 99 %. Body mass index is 25.79 kg/m.  General: Cooperative, alert, well developed, in no acute distress. HEENT: Conjunctivae and lids unremarkable. Cardiovascular: Regular rhythm.  Lungs: Normal work of breathing. Neurologic: No focal deficits.   Lab Results  Component Value Date   CREATININE 0.90 01/16/2022   BUN 11 01/16/2022   NA 140 01/16/2022   K 4.7 01/16/2022   CL 104 01/16/2022   CO2 24 01/16/2022   Lab Results  Component Value Date   ALT 45 (H) 01/16/2022   AST 29 01/16/2022   ALKPHOS 79 01/16/2022   BILITOT 0.3 01/16/2022   Lab Results  Component Value Date   HGBA1C 5.1 01/16/2022   HGBA1C 5.0 01/11/2021   HGBA1C 5.3 09/22/2020   HGBA1C 5.2 08/26/2020   HGBA1C 5.1 03/24/2020   Lab Results  Component Value Date   INSULIN 4.8 01/16/2022   INSULIN 7.9 01/11/2021   INSULIN 5.0 09/14/2020   INSULIN 5.1 03/24/2020   INSULIN 5.9 09/14/2019   Lab Results  Component Value Date   TSH 2.170 01/16/2022   Lab Results  Component Value Date   CHOL 194 01/16/2022   HDL 71  01/16/2022   LDLCALC 113 (H) 01/16/2022   TRIG 54 01/16/2022   CHOLHDL 3.0 03/24/2020   Lab Results  Component Value Date   VD25OH 41.5 01/16/2022   VD25OH 48.5 01/11/2021   VD25OH 80.1 09/14/2020   Lab Results  Component Value Date   WBC 4.2 07/21/2021   HGB 12.1 07/21/2021   HCT 38.0 07/21/2021   MCV 95.5 07/21/2021   PLT 325 07/21/2021   Lab Results  Component Value Date   IRON 111 09/03/2019   TIBC 292 09/03/2019   FERRITIN 187 09/03/2019   Attestation Statements:   Reviewed by clinician on day of visit: allergies, medications, problem list, medical history, surgical history, family history, social history, and previous encounter notes.   I, Trixie Dredge, am acting as transcriptionist for Dennard Nip, MD.  I have reviewed the above documentation for accuracy and completeness, and I agree with the above. -  Dennard Nip, MD

## 2022-01-30 ENCOUNTER — Telehealth: Payer: Self-pay

## 2022-01-30 NOTE — Telephone Encounter (Signed)
PA approval for Motegrity was approved from 01-26-2022 through 01-27-2023. Approval letter to be scanned into patient's chart.

## 2022-02-01 ENCOUNTER — Other Ambulatory Visit: Payer: Self-pay | Admitting: Internal Medicine

## 2022-02-02 ENCOUNTER — Encounter (HOSPITAL_COMMUNITY): Payer: Self-pay | Admitting: *Deleted

## 2022-02-02 ENCOUNTER — Ambulatory Visit (HOSPITAL_COMMUNITY): Payer: 59

## 2022-02-05 ENCOUNTER — Ambulatory Visit: Payer: 59 | Admitting: Internal Medicine

## 2022-02-06 ENCOUNTER — Other Ambulatory Visit (HOSPITAL_COMMUNITY): Payer: 59

## 2022-02-07 ENCOUNTER — Encounter: Payer: Self-pay | Admitting: Adult Health

## 2022-02-07 ENCOUNTER — Ambulatory Visit (INDEPENDENT_AMBULATORY_CARE_PROVIDER_SITE_OTHER): Payer: 59 | Admitting: Adult Health

## 2022-02-07 VITALS — BP 103/74 | HR 78 | Ht 62.0 in | Wt 143.0 lb

## 2022-02-07 DIAGNOSIS — R61 Generalized hyperhidrosis: Secondary | ICD-10-CM

## 2022-02-07 DIAGNOSIS — G4700 Insomnia, unspecified: Secondary | ICD-10-CM

## 2022-02-07 DIAGNOSIS — Z1211 Encounter for screening for malignant neoplasm of colon: Secondary | ICD-10-CM | POA: Diagnosis not present

## 2022-02-07 DIAGNOSIS — Z90710 Acquired absence of both cervix and uterus: Secondary | ICD-10-CM | POA: Diagnosis not present

## 2022-02-07 DIAGNOSIS — Z01419 Encounter for gynecological examination (general) (routine) without abnormal findings: Secondary | ICD-10-CM

## 2022-02-07 DIAGNOSIS — R6882 Decreased libido: Secondary | ICD-10-CM | POA: Insufficient documentation

## 2022-02-07 DIAGNOSIS — R69 Illness, unspecified: Secondary | ICD-10-CM | POA: Diagnosis not present

## 2022-02-07 LAB — HEMOCCULT GUIAC POC 1CARD (OFFICE): Fecal Occult Blood, POC: NEGATIVE

## 2022-02-07 MED ORDER — PROGESTERONE MICRONIZED 100 MG PO CAPS: ORAL_CAPSULE | ORAL | 2 refills | Status: DC

## 2022-02-07 NOTE — Progress Notes (Signed)
Patient ID: Brooke [name], female   DOB: 04-17-1970, 52 y.o.   MRN: [id number] History of Present Illness: Brooke [name] is a 52 year old black female,married, sp hysterectomy in for a well woman gyn exam. She is complaining of night sweats, no sex drive, dizzy,nausea, not sleeping well, and ?panic attacks.  She is sp gastric bypass 2019, has lost 100 lbs. She sees BH,too.  PCP is Dr Posey Pronto.  Current Medications, Allergies, Past Medical History, Past Surgical History, Family History and Social History were reviewed in [employer] record.     Review of Systems: Patient denies any headaches, hearing loss, fatigue, blurred vision, shortness of breath, abdominal pain, problems with bowel movements(has some constipation), urination, or intercourse. No joint pain or mood swings.  Has chest pain if panic attack See HPI for positives   Physical Exam:BP 103/74 (BP Location: Left Arm, Patient Position: Sitting, Cuff Size: Normal)   Pulse 78   Ht '5\' 2"'$  (1.575 m)   Wt 143 lb (64.9 kg)   LMP 09/01/2020 (Approximate)   BMI 26.16 kg/m   General:  Well developed, well nourished, no acute distress Skin:  Warm and dry Neck:  Midline trachea, normal thyroid, good ROM, no lymphadenopathy Lungs; Clear to auscultation bilaterally Breast:  No dominant palpable mass, retraction, or nipple discharge Cardiovascular: Regular rate and rhythm Abdomen:  Soft, non tender, no hepatosplenomegaly Pelvic:  External genitalia is normal in appearance, no lesions.  The vagina is pink and moist. Urethra has no lesions or masses. The cervix and uterus are absent, normal vaginal cuff.  No adnexal masses or tenderness noted.Bladder is non tender, no masses felt. Rectal: Good sphincter tone, no polyps, or hemorrhoids felt.  Hemoccult negative. Extremities/musculoskeletal:  No swelling or varicosities noted, no clubbing or cyanosis Psych:  No mood changes, alert and cooperative,seems happy AA is 2 Fall risk is  low    02/07/2022    3:54 PM 08/09/2021    3:46 PM 06/13/2021   10:10 AM  Depression screen PHQ 2/9  Decreased Interest 2 0   Down, Depressed, Hopeless 3 0   PHQ - 2 Score 5 0   Altered sleeping 3    Tired, decreased energy 3    Change in appetite 1    Feeling bad or failure about yourself  1    Trouble concentrating 3    Moving slowly or fidgety/restless 0    Suicidal thoughts 0    PHQ-9 Score 16       Information is confidential and restricted. Go to Review Flowsheets to unlock data.   She is on meds and sees [facility]    02/07/2022    3:54 PM 07/20/2021   10:13 AM 06/13/2021   10:13 AM 11/25/2019    9:18 AM  GAD 7 : Generalized Anxiety Score  Nervous, Anxious, on Edge 3   1  Control/stop worrying 3   1  Worry too much - different things 3   1  Trouble relaxing 3   1  Restless 2   0  Easily annoyed or irritable 3   1  Afraid - awful might happen 3   0  Total GAD 7 Score 20   5  Anxiety Difficulty    Not difficult at all     Information is confidential and restricted. Go to Review Flowsheets to unlock data.      Upstream - 02/07/22 1559       Pregnancy Intention Screening   Does the patient  want to become pregnant in the next year? N/A    Does the patient's partner want to become pregnant in the next year? N/A    Would the patient like to discuss contraceptive options today? N/A      Contraception Wrap Up   Current Method Female Sterilization   hyst   End Method Female Sterilization   hyst   Contraception Counseling Provided No            Examination chaperoned by Levy Pupa LPN  Impression and Plan: 1. Encounter for well woman exam with routine gynecological exam Physical in 1 year Mammogram was 02/07/21 Colonoscopy per GI Labs with PCP  2. Encounter for screening fecal occult blood testing Hemoccult was negative   3. S/P hysterectomy  4. Night sweats Will try Prometrium 100 mg at hs, she is not estrogen candidate  Meds ordered this encounter  Medications    progesterone (PROMETRIUM) 100 MG capsule    Sig: Take 1 at bedtime    Dispense:  30 capsule    Refill:  2    Order Specific Question:   Supervising Provider    Answer:   Tania Ade H [[id number]]   Follow up in 8 weeks for ROS  5. Decreased libido Try to have sex more often Discussed some of her meds can contribute to this   6. Insomnia, unspecified type She tried trazodone in past, makes her sick Will try Prometrium 100 mg 1  at bedtime

## 2022-02-09 ENCOUNTER — Other Ambulatory Visit: Payer: Self-pay | Admitting: Family Medicine

## 2022-02-09 ENCOUNTER — Other Ambulatory Visit: Payer: Self-pay | Admitting: Neurology

## 2022-02-09 NOTE — Telephone Encounter (Signed)
Urgent Care Patient Requested Prescriptions  Pending Prescriptions Disp Refills   fluticasone (FLONASE) 50 MCG/ACT nasal spray [Pharmacy Med Name: FLUTICASONE PROP 50 MCG SPRAY] 16 mL 2    Sig: SHAKE LIQUID AND USE 1 SPRAY IN EACH NOSTRIL TWICE A DAY     There is no refill protocol information for this order

## 2022-02-17 ENCOUNTER — Other Ambulatory Visit (INDEPENDENT_AMBULATORY_CARE_PROVIDER_SITE_OTHER): Payer: Self-pay | Admitting: Family Medicine

## 2022-02-17 DIAGNOSIS — E1165 Type 2 diabetes mellitus with hyperglycemia: Secondary | ICD-10-CM
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
## 2022-02-20 ENCOUNTER — Other Ambulatory Visit (HOSPITAL_COMMUNITY): Payer: 59

## 2022-02-21 ENCOUNTER — Telehealth (INDEPENDENT_AMBULATORY_CARE_PROVIDER_SITE_OTHER): Payer: 59 | Admitting: Psychiatry

## 2022-02-21 ENCOUNTER — Other Ambulatory Visit: Payer: Self-pay | Admitting: Psychiatry

## 2022-02-21 ENCOUNTER — Encounter: Payer: Self-pay | Admitting: Psychiatry

## 2022-02-21 DIAGNOSIS — F411 Generalized anxiety disorder: Secondary | ICD-10-CM | POA: Diagnosis not present

## 2022-02-21 DIAGNOSIS — F3341 Major depressive disorder, recurrent, in partial remission: Secondary | ICD-10-CM | POA: Diagnosis not present

## 2022-02-21 DIAGNOSIS — G4700 Insomnia, unspecified: Secondary | ICD-10-CM | POA: Diagnosis not present

## 2022-02-21 DIAGNOSIS — R69 Illness, unspecified: Secondary | ICD-10-CM | POA: Diagnosis not present

## 2022-02-21 MED ORDER — GABAPENTIN 100 MG PO CAPS: ORAL_CAPSULE | ORAL | 2 refills | Status: DC

## 2022-02-21 NOTE — Patient Instructions (Addendum)
Continue lamotrigine 200 mg daily  Change: gabapentin 100 mg daily and 200 mg at night   Obtain EKG  Next appointment: 5/15 at 11:30

## 2022-02-22 ENCOUNTER — Telehealth: Payer: Self-pay | Admitting: Adult Health

## 2022-02-22 NOTE — Telephone Encounter (Signed)
Left message to call me in am, can try veozah if interested.

## 2022-03-03 ENCOUNTER — Other Ambulatory Visit (INDEPENDENT_AMBULATORY_CARE_PROVIDER_SITE_OTHER): Payer: Self-pay | Admitting: Family Medicine

## 2022-03-03 ENCOUNTER — Other Ambulatory Visit: Payer: Self-pay | Admitting: Family Medicine

## 2022-03-03 DIAGNOSIS — E1165 Type 2 diabetes mellitus with hyperglycemia: Secondary | ICD-10-CM

## 2022-03-05 ENCOUNTER — Other Ambulatory Visit (INDEPENDENT_AMBULATORY_CARE_PROVIDER_SITE_OTHER): Payer: Self-pay | Admitting: Family Medicine

## 2022-03-05 DIAGNOSIS — E1165 Type 2 diabetes mellitus with hyperglycemia: Secondary | ICD-10-CM

## 2022-03-12 ENCOUNTER — Other Ambulatory Visit: Payer: Self-pay | Admitting: Adult Health

## 2022-03-12 MED ORDER — VEOZAH 45 MG PO TABS: 45.0000 mg | ORAL_TABLET | Freq: Every day | ORAL | 6 refills | Status: DC

## 2022-03-12 NOTE — Progress Notes (Signed)
Rx sent for veozah

## 2022-03-13 ENCOUNTER — Ambulatory Visit (INDEPENDENT_AMBULATORY_CARE_PROVIDER_SITE_OTHER): Payer: 59 | Admitting: Family Medicine

## 2022-03-13 ENCOUNTER — Encounter (INDEPENDENT_AMBULATORY_CARE_PROVIDER_SITE_OTHER): Payer: Self-pay | Admitting: Family Medicine

## 2022-03-13 ENCOUNTER — Telehealth: Payer: Self-pay | Admitting: *Deleted

## 2022-03-13 VITALS — BP 134/90 | HR 69 | Temp 98.1°F | Ht 62.0 in | Wt 137.0 lb

## 2022-03-13 DIAGNOSIS — Z6825 Body mass index (BMI) 25.0-25.9, adult: Secondary | ICD-10-CM | POA: Diagnosis not present

## 2022-03-13 DIAGNOSIS — E669 Obesity, unspecified: Secondary | ICD-10-CM

## 2022-03-13 DIAGNOSIS — R632 Polyphagia: Secondary | ICD-10-CM

## 2022-03-13 DIAGNOSIS — J3089 Other allergic rhinitis: Secondary | ICD-10-CM | POA: Diagnosis not present

## 2022-03-13 MED ORDER — LEVOCETIRIZINE DIHYDROCHLORIDE 5 MG PO TABS: 5.0000 mg | ORAL_TABLET | Freq: Every evening | ORAL | 0 refills | Status: DC

## 2022-03-13 MED ORDER — TRULICITY 3 MG/0.5ML ~~LOC~~ SOAJ: SUBCUTANEOUS | 0 refills | Status: DC

## 2022-03-13 NOTE — Telephone Encounter (Signed)
Veozah 45 mg samples given to pt. Lot # [id number] exp 08/2022. 2 boxes given. Pt aware to pick up at front desk. [place]

## 2022-03-14 ENCOUNTER — Telehealth: Payer: Self-pay | Admitting: *Deleted

## 2022-03-14 NOTE — Telephone Encounter (Signed)
Left message @ 1:33 pm letting pt know I have talked to insurance company([phone]) regarding Veozah. PA has been done. There is a 24-72 hour turnaround time. PA # [id number]

## 2022-03-16 ENCOUNTER — Other Ambulatory Visit: Payer: Self-pay | Admitting: Adult Health

## 2022-03-16 MED ORDER — VEOZAH 45 MG PO TABS: 1.0000 | ORAL_TABLET | Freq: Every day | ORAL | 6 refills | Status: DC

## 2022-03-16 NOTE — Progress Notes (Signed)
Rx for veozah sent to [facility]

## 2022-03-20 ENCOUNTER — Ambulatory Visit (INDEPENDENT_AMBULATORY_CARE_PROVIDER_SITE_OTHER): Payer: 59 | Admitting: Internal Medicine

## 2022-03-20 ENCOUNTER — Encounter: Payer: Self-pay | Admitting: Internal Medicine

## 2022-03-20 ENCOUNTER — Ambulatory Visit (HOSPITAL_COMMUNITY)
Admission: RE | Admit: 2022-03-20 | Discharge: 2022-03-20 | Disposition: A | Discharge status: Home / Self Care | Payer: 59 | Source: Ambulatory Visit | Attending: Gastroenterology | Admitting: Gastroenterology

## 2022-03-20 VITALS — BP 115/77 | HR 78 | Ht 62.0 in | Wt 139.4 lb

## 2022-03-20 DIAGNOSIS — K219 Gastro-esophageal reflux disease without esophagitis: Secondary | ICD-10-CM | POA: Diagnosis not present

## 2022-03-20 DIAGNOSIS — R1319 Other dysphagia: Secondary | ICD-10-CM | POA: Diagnosis not present

## 2022-03-20 DIAGNOSIS — K5900 Constipation, unspecified: Secondary | ICD-10-CM | POA: Insufficient documentation

## 2022-03-20 DIAGNOSIS — M19041 Primary osteoarthritis, right hand: Secondary | ICD-10-CM

## 2022-03-20 DIAGNOSIS — M5136 Other intervertebral disc degeneration, lumbar region: Secondary | ICD-10-CM | POA: Diagnosis not present

## 2022-03-20 DIAGNOSIS — K760 Fatty (change of) liver, not elsewhere classified: Secondary | ICD-10-CM | POA: Insufficient documentation

## 2022-03-20 DIAGNOSIS — E1169 Type 2 diabetes mellitus with other specified complication: Secondary | ICD-10-CM | POA: Diagnosis not present

## 2022-03-20 DIAGNOSIS — R569 Unspecified convulsions: Secondary | ICD-10-CM

## 2022-03-20 DIAGNOSIS — M5030 Other cervical disc degeneration, unspecified cervical region: Secondary | ICD-10-CM | POA: Insufficient documentation

## 2022-03-20 DIAGNOSIS — M19042 Primary osteoarthritis, left hand: Secondary | ICD-10-CM | POA: Diagnosis not present

## 2022-03-20 DIAGNOSIS — R1310 Dysphagia, unspecified: Secondary | ICD-10-CM | POA: Diagnosis not present

## 2022-03-20 DIAGNOSIS — M2550 Pain in unspecified joint: Secondary | ICD-10-CM

## 2022-03-20 MED ORDER — MELOXICAM 7.5 MG PO TABS: 7.5000 mg | ORAL_TABLET | Freq: Every day | ORAL | 2 refills | Status: DC

## 2022-03-20 MED ORDER — DICLOFENAC SODIUM 1 % EX GEL: 4.0000 g | Freq: Four times a day (QID) | CUTANEOUS | 0 refills | Status: DC

## 2022-03-20 NOTE — Assessment & Plan Note (Signed)
Chronic low back pain Has had Orthopedic surgeon evaluation Mobic PRN for pain Avoid heavy lifting and frequent bending Simple back exercises, material provided

## 2022-03-20 NOTE — Assessment & Plan Note (Signed)
Neck pain likely due to DDD of cervical spine Has seen Orthopedic surgeon at [place] Has had steroid injections in the past Mobic PRN for pain

## 2022-03-20 NOTE — Assessment & Plan Note (Signed)
Hands, neck and back pain Likely due to OA and DDD of spine Would check ANA, RF and ESR for autoimmune etiology

## 2022-03-20 NOTE — Assessment & Plan Note (Signed)
Pain and swelling in PIP and DIP joints likely due to OA Advised to apply Voltaren gel PRN Simple hand exercises

## 2022-03-20 NOTE — Assessment & Plan Note (Addendum)
Lab Results  Component Value Date   HGBA1C 5.1 01/16/2022   Wel-controlled Associated with HLD On Trulicity for weight loss benefit, followed by weight loss clinic Advised to follow diabetic diet F/u CMP and lipid panel later Diabetic eye exam: Advised to follow up with Ophthalmology for diabetic eye exam

## 2022-03-20 NOTE — Patient Instructions (Signed)
Please take Meloxicam as needed for neck and back pain.  Please apply Voltaren gel over hand joints.  Please perform simple back exercises as discussed.
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
## 2022-03-20 NOTE — Assessment & Plan Note (Signed)
On Keppra Followed by Neurology

## 2022-03-21 NOTE — Progress Notes (Unsigned)
Chief Complaint:   OBESITY Caydince is here to discuss her progress with her obesity treatment plan along with follow-up of her obesity related diagnoses. Angie is on the [employer] and states she is following her eating plan approximately 75% of the time. Vannary states she is walking for 30 minutes 2-3 times per week.  Today's visit was #: 70 Starting weight: 214 lbs Starting date: 09/14/2019 Today's weight: 137 lbs Today's date: 03/13/2022 Total lbs lost to date: 77 Total lbs lost since last in-office visit: 4  Interim History: Quamesha continues to do well with maintaining her weight loss. Her goal is to stay in the mid 130's. She is doing well with meeting her protein goals and is walking when the weather is nice.   Subjective:   1. Polyphagia Jacquelynne is doing well with her diet, and she notes some nausea with her Trulicity dose.   2. Environmental and seasonal allergies Khai notes symptoms of rhinitis and eye itch have increased in the last month with upcoming Spring season. She requests a refill of Xyzal.  Assessment/Plan:   1. Polyphagia [name] agreed to decrease Trulicity to 1.5 mg, and we will refill for 90 days.   - Dulaglutide (TRULICITY) 3 0000000 SOPN; INJECT 3 MG INTO THE SKIN ONE TIME PER WEEK  Dispense: 6 mL; Refill: 0  2. Environmental and seasonal allergies Ame will continue Xyzal and we will refill for 90 days; and she will continue OTC Flonase.   - levocetirizine (XYZAL) 5 MG tablet; Take 1 tablet (5 mg total) by mouth every evening.  Dispense: 90 tablet; Refill: 0  3. BMI 25.0-25.9,adult  4. Obesity, Beginning BMI 39.69 Marcie is currently in the action stage of change. As such, her goal is to maintain weight for now. She has agreed to the [employer].   Exercise goals: As is.   Behavioral modification strategies: increasing lean protein intake and ways to avoid night time snacking.  Leeyana has agreed to follow-up with our clinic in  12 weeks. She was informed of the importance of frequent follow-up visits to maximize her success with intensive lifestyle modifications for her multiple health conditions.   Objective:   Blood pressure (!) 134/90, pulse 69, temperature 98.1 F (36.7 C), height 5\' 2"  (1.575 m), weight 137 lb (62.1 kg), last menstrual period 09/01/2020, SpO2 99 %. Body mass index is 25.06 kg/m.  Lab Results  Component Value Date   CREATININE 0.90 01/16/2022   BUN 11 01/16/2022   NA 140 01/16/2022   K 4.7 01/16/2022   CL 104 01/16/2022   CO2 24 01/16/2022   Lab Results  Component Value Date   ALT 45 (H) 01/16/2022   AST 29 01/16/2022   ALKPHOS 79 01/16/2022   BILITOT 0.3 01/16/2022   Lab Results  Component Value Date   HGBA1C 5.1 01/16/2022   HGBA1C 5.0 01/11/2021   HGBA1C 5.3 09/22/2020   HGBA1C 5.2 08/26/2020   HGBA1C 5.1 03/24/2020   Lab Results  Component Value Date   INSULIN 4.8 01/16/2022   INSULIN 7.9 01/11/2021   INSULIN 5.0 09/14/2020   INSULIN 5.1 03/24/2020   INSULIN 5.9 09/14/2019   Lab Results  Component Value Date   TSH 2.170 01/16/2022   Lab Results  Component Value Date   CHOL 194 01/16/2022   HDL 71 01/16/2022   LDLCALC 113 (H) 01/16/2022   TRIG 54 01/16/2022   CHOLHDL 3.0 03/24/2020   Lab Results  Component Value Date  VD25OH 41.5 01/16/2022   VD25OH 48.5 01/11/2021   VD25OH 80.1 09/14/2020   Lab Results  Component Value Date   WBC 4.2 07/21/2021   HGB 12.1 07/21/2021   HCT 38.0 07/21/2021   MCV 95.5 07/21/2021   PLT 325 07/21/2021   Lab Results  Component Value Date   IRON 111 09/03/2019   TIBC 292 09/03/2019   FERRITIN 187 09/03/2019   Attestation Statements:   Reviewed by clinician on day of visit: allergies, medications, problem list, medical history, surgical history, family history, social history, and previous encounter notes.   I, Trixie Dredge, am acting as transcriptionist for Dennard Nip, MD.  I have reviewed the above  documentation for accuracy and completeness, and I agree with the above. -  Dennard Nip, MD

## 2022-03-23 ENCOUNTER — Encounter: Payer: Self-pay | Admitting: *Deleted

## 2022-03-26 DIAGNOSIS — M2550 Pain in unspecified joint: Secondary | ICD-10-CM | POA: Diagnosis not present

## 2022-03-28 ENCOUNTER — Ambulatory Visit: Payer: 59 | Admitting: Orthopedic Surgery

## 2022-03-28 ENCOUNTER — Encounter: Payer: Self-pay | Admitting: Internal Medicine

## 2022-03-28 ENCOUNTER — Encounter: Payer: Self-pay | Admitting: *Deleted

## 2022-03-28 DIAGNOSIS — M7502 Adhesive capsulitis of left shoulder: Secondary | ICD-10-CM | POA: Diagnosis not present

## 2022-03-28 LAB — ANA W/REFLEX: Anti Nuclear Antibody (ANA): NEGATIVE

## 2022-03-28 LAB — RHEUMATOID FACTOR: Rheumatoid fact SerPl-aCnc: <10 IU/mL (ref <14.0)

## 2022-03-28 LAB — SEDIMENTATION RATE: Sed Rate: 5 mm/hr (ref 0–40)

## 2022-04-01 ENCOUNTER — Encounter: Payer: Self-pay | Admitting: Orthopedic Surgery
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
## 2022-04-02 ENCOUNTER — Encounter: Payer: Self-pay | Admitting: *Deleted

## 2022-04-02 ENCOUNTER — Telehealth: Payer: Self-pay | Admitting: *Deleted

## 2022-04-02 NOTE — Telephone Encounter (Signed)
Pt's insurance has approved Veozah. Pt aware. Case # [id number]. [place]

## 2022-04-03 ENCOUNTER — Ambulatory Visit: Payer: 59 | Admitting: Internal Medicine

## 2022-04-03 ENCOUNTER — Ambulatory Visit: Payer: 59 | Admitting: Adult Health

## 2022-04-03 ENCOUNTER — Encounter: Payer: Self-pay | Admitting: Adult Health

## 2022-04-03 VITALS — BP 114/76 | HR 75 | Ht 62.0 in | Wt 140.5 lb

## 2022-04-03 DIAGNOSIS — F419 Anxiety disorder, unspecified: Secondary | ICD-10-CM | POA: Diagnosis not present

## 2022-04-03 DIAGNOSIS — R6882 Decreased libido: Secondary | ICD-10-CM

## 2022-04-03 DIAGNOSIS — Z90710 Acquired absence of both cervix and uterus: Secondary | ICD-10-CM | POA: Diagnosis not present

## 2022-04-03 DIAGNOSIS — R61 Generalized hyperhidrosis: Secondary | ICD-10-CM

## 2022-04-03 MED ORDER — BUSPIRONE HCL 5 MG PO TABS: 5.0000 mg | ORAL_TABLET | Freq: Three times a day (TID) | ORAL | 3 refills | Status: DC

## 2022-04-03 NOTE — Progress Notes (Signed)
  Subjective:     Patient ID: Brooke [name], female   DOB: December 30, 1970, 52 y.o.   MRN: [id number]  HPI Brooke [name] is a 52 year old black female, married, PM back in follow up on trying Prometrium for night sweats and sleep and had to stop it gave her headaches, so then tried [employer] and it helps. The night sweats are better and sleeping better, but still having some anxiety, panic like feelings.  PCP is Dr Brooke [name].  Review of Systems Night sweats better Sleeping better Anxious with panic like feelings Still has decreased libido Reviewed past medical,surgical, social and family history. Reviewed medications and allergies.     Objective:   Physical Exam BP 114/76 (BP Location: Left Arm, Patient Position: Sitting, Cuff Size: Normal)   Pulse 75   Ht 5\' 2"  (1.575 m)   Wt 140 lb 8 oz (63.7 kg)   LMP 09/01/2020 (Approximate)   BMI 25.70 kg/m     Skin warm and dry. Lungs: clear to ausculation bilaterally. Cardiovascular: regular rate and rhythm.   Upstream - 04/03/22 1606       Pregnancy Intention Screening   Does the patient want to become pregnant in the next year? N/A    Does the patient's partner want to become pregnant in the next year? N/A    Would the patient like to discuss contraceptive options today? N/A      Contraception Wrap Up   Current Method Female Sterilization   hyst   End Method Female Sterilization   hyst   Contraception Counseling Provided No             Assessment:     1. Night sweats Much better on Veozah Has rx   2. Decreased libido   3. S/P hysterectomy   4. Anxiety Will try buspar 5 mg 1 tid  Meds ordered this encounter  Medications   busPIRone (BUSPAR) 5 MG tablet    Sig: Take 1 tablet (5 mg total) by mouth 3 (three) times daily.    Dispense:  90 tablet    Refill:  3    Order Specific Question:   Supervising Provider    Answer:   Florian Buff [[id number]]       Plan:     Follow up in 4 weeks for ROS and labs to check liver function

## 2022-04-05 ENCOUNTER — Ambulatory Visit: Payer: 59 | Admitting: Internal Medicine

## 2022-04-23 ENCOUNTER — Other Ambulatory Visit: Payer: Self-pay | Admitting: Surgical

## 2022-04-23 ENCOUNTER — Encounter: Payer: Self-pay | Admitting: Orthopedic Surgery

## 2022-04-23 DIAGNOSIS — G8918 Other acute postprocedural pain: Secondary | ICD-10-CM | POA: Diagnosis not present

## 2022-04-23 DIAGNOSIS — M7552 Bursitis of left shoulder: Secondary | ICD-10-CM | POA: Diagnosis not present

## 2022-04-23 DIAGNOSIS — M7502 Adhesive capsulitis of left shoulder: Secondary | ICD-10-CM | POA: Diagnosis not present

## 2022-04-23 DIAGNOSIS — M659 Synovitis and tenosynovitis, unspecified: Secondary | ICD-10-CM | POA: Diagnosis not present

## 2022-04-23 HISTORY — PX: OTHER SURGICAL HISTORY: SHX169

## 2022-04-23 MED ORDER — METHOCARBAMOL 500 MG PO TABS: 500.0000 mg | ORAL_TABLET | Freq: Three times a day (TID) | ORAL | 1 refills | Status: DC | PRN

## 2022-04-23 MED ORDER — CELECOXIB 100 MG PO CAPS: 100.0000 mg | ORAL_CAPSULE | Freq: Two times a day (BID) | ORAL | 0 refills | Status: DC

## 2022-04-23 MED ORDER — OXYCODONE HCL 5 MG PO TABS: 5.0000 mg | ORAL_TABLET | ORAL | 0 refills | Status: DC | PRN

## 2022-04-25 ENCOUNTER — Encounter: Payer: 59 | Admitting: Orthopedic Surgery

## 2022-04-25 ENCOUNTER — Other Ambulatory Visit: Payer: Self-pay | Admitting: Adult Health

## 2022-04-29 ENCOUNTER — Other Ambulatory Visit: Payer: Self-pay | Admitting: Psychiatry

## 2022-05-02 ENCOUNTER — Ambulatory Visit (INDEPENDENT_AMBULATORY_CARE_PROVIDER_SITE_OTHER): Payer: 59 | Admitting: Surgical

## 2022-05-02 ENCOUNTER — Encounter: Payer: Self-pay | Admitting: Orthopedic Surgery

## 2022-05-02 DIAGNOSIS — Z9889 Other specified postprocedural states: Secondary | ICD-10-CM
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
## 2022-05-06 ENCOUNTER — Other Ambulatory Visit: Payer: Self-pay | Admitting: Adult Health

## 2022-05-07 ENCOUNTER — Telehealth: Payer: Self-pay

## 2022-05-07 NOTE — Telephone Encounter (Signed)
pt was notified that rx was already at the pharmacy and i asked them to get it ready for her. and told her to contact pharmacy later to see if it has been processed.

## 2022-05-07 NOTE — Telephone Encounter (Signed)
spoke with pharmacy. they have a 28 day supply on hold for patient. asked if they would go ahead and process for patinet.

## 2022-05-07 NOTE — Telephone Encounter (Signed)
pt called states she needs a refll on the lamictal. Pt was last seen on 2-21 next appt 5-15

## 2022-05-08 ENCOUNTER — Other Ambulatory Visit: Payer: Self-pay | Admitting: Internal Medicine

## 2022-05-08 ENCOUNTER — Other Ambulatory Visit: Payer: Self-pay

## 2022-05-08 ENCOUNTER — Encounter: Payer: Self-pay | Admitting: Internal Medicine

## 2022-05-08 ENCOUNTER — Ambulatory Visit: Payer: 59 | Admitting: Internal Medicine

## 2022-05-08 VITALS — BP 136/86 | HR 67 | Temp 97.9°F | Ht 62.0 in | Wt 142.6 lb

## 2022-05-08 DIAGNOSIS — R1319 Other dysphagia: Secondary | ICD-10-CM | POA: Diagnosis not present

## 2022-05-08 DIAGNOSIS — K219 Gastro-esophageal reflux disease without esophagitis: Secondary | ICD-10-CM | POA: Diagnosis not present

## 2022-05-08 MED ORDER — BISMUTH/METRONIDAZ/TETRACYCLIN 140-125-125 MG PO CAPS: 3.0000 | ORAL_CAPSULE | Freq: Four times a day (QID) | ORAL | 0 refills | Status: DC

## 2022-05-08 MED ORDER — METRONIDAZOLE 500 MG PO TABS: 500.0000 mg | ORAL_TABLET | Freq: Three times a day (TID) | ORAL | 0 refills | Status: AC

## 2022-05-08 MED ORDER — BISMUTH 262 MG PO CHEW: CHEWABLE_TABLET | ORAL | 0 refills | Status: DC

## 2022-05-08 MED ORDER — TETRACYCLINE HCL 500 MG PO CAPS: 500.0000 mg | ORAL_CAPSULE | Freq: Three times a day (TID) | ORAL | 0 refills | Status: DC
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
## 2022-05-08 NOTE — Patient Instructions (Addendum)
It was good to see you again today!  As discussed, speech/swallowing evaluation would likely be very helpful with your swallowing symptoms.  We will schedule.  For now, continue pantoprazole 40 mg twice daily  Later this year, we will schedule an average risk screening colonoscopy ASA 2  You had evidence of H. pylori on biopsies back in 2014.  Reviewing the chart indicates you were never treated.  You need to be treated now.  Pylera 3 capsules 4 times daily x 14 days.  Continue taking Protonix 40 mg twice daily.  It is important to take this entire course of medication.  Later, we will check a stool antigen as to make sure there is no evidence of residual infection.  Take MiraLAX 17 g orally twice daily for now  - if it produces loose stools back off to 1 dose daily.  Would like for you to have a bowel movement daily to every other day-no less than 3 times weekly without diarrhea.  Office visit 3 months.

## 2022-05-09 ENCOUNTER — Encounter (INDEPENDENT_AMBULATORY_CARE_PROVIDER_SITE_OTHER): Payer: Self-pay | Admitting: Family Medicine
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
## 2022-05-14 ENCOUNTER — Ambulatory Visit: Payer: 59 | Admitting: Adult Health

## 2022-05-14 ENCOUNTER — Encounter: Payer: Self-pay | Admitting: Adult Health

## 2022-05-14 ENCOUNTER — Encounter: Payer: Self-pay | Admitting: *Deleted

## 2022-05-14 VITALS — BP 119/76 | HR 80 | Ht 62.0 in | Wt 140.0 lb

## 2022-05-14 DIAGNOSIS — Z9229 Personal history of other drug therapy: Secondary | ICD-10-CM | POA: Diagnosis not present

## 2022-05-14 DIAGNOSIS — R6882 Decreased libido: Secondary | ICD-10-CM

## 2022-05-14 DIAGNOSIS — Z90710 Acquired absence of both cervix and uterus: Secondary | ICD-10-CM | POA: Diagnosis not present

## 2022-05-14 DIAGNOSIS — F419 Anxiety disorder, unspecified: Secondary | ICD-10-CM

## 2022-05-14 DIAGNOSIS — R61 Generalized hyperhidrosis: Secondary | ICD-10-CM

## 2022-05-14 MED ORDER — BUSPIRONE HCL 5 MG PO TABS: 5.0000 mg | ORAL_TABLET | Freq: Three times a day (TID) | ORAL | 6 refills | Status: DC

## 2022-05-14 NOTE — Progress Notes (Signed)
  Subjective:     Patient ID: Brooke [name], female   DOB: 04/28/1970, 52 y.o.   MRN: [id number]  HPI Luzmaria is a 52 year old black female,married, sp hysterectomy back in follow up on taking buspar for anxiety and is better and veozah for night sweats and much better. Still has decreased libido.  PCP is Dr Allena Katz.  Review of Systems Anxiety is better Night sweats much better Still has decreased libido. Reviewed past medical,surgical, social and family history. Reviewed medications and allergies.     Objective:   Physical Exam BP 119/76 (BP Location: Left Arm, Patient Position: Sitting, Cuff Size: Normal)   Pulse 80   Ht 5\' 2"  (1.575 m)   Wt 140 lb (63.5 kg)   LMP 09/01/2020 (Approximate)   BMI 25.61 kg/m  Skin warm and dry. Lungs: clear to ausculation bilaterally. Cardiovascular: regular rate and rhythm.    Fall risk is low    05/14/2022   10:45 AM 03/20/2022    4:34 PM 02/07/2022    3:54 PM  Depression screen PHQ 2/9  Decreased Interest 0 3 2  Down, Depressed, Hopeless 1 1 3   PHQ - 2 Score 1 4 5   Altered sleeping 0  3  Tired, decreased energy 1 2 3   Change in appetite 0 0 1  Feeling bad or failure about yourself  1 0 1  Trouble concentrating 2 2 3   Moving slowly or fidgety/restless 0 0 0  Suicidal thoughts 0 0 0  PHQ-9 Score 5  16  Difficult doing work/chores  Somewhat difficult        05/14/2022   10:46 AM 03/20/2022    4:34 PM 02/07/2022    3:54 PM 07/20/2021   10:13 AM  GAD 7 : Generalized Anxiety Score  Nervous, Anxious, on Edge 1 1 3    Control/stop worrying 1 3 3    Worry too much - different things 1 3 3    Trouble relaxing 0 2 3   Restless 1 0 2   Easily annoyed or irritable 3 3 3    Afraid - awful might happen 1 2 3    Total GAD 7 Score 8 14 20    Anxiety Difficulty  Somewhat difficult       Information is confidential and restricted. Go to Review Flowsheets to unlock data.     Assessment:     1. Night sweats Much better, has refills on Veozah  2.  Anxiety Feels better, will continue Buspar Meds ordered this encounter  Medications   busPIRone (BUSPAR) 5 MG tablet    Sig: Take 1 tablet (5 mg total) by mouth 3 (three) times daily.    Dispense:  90 tablet    Refill:  6    Order Specific Question:   Supervising Provider    Answer:   Despina Hidden, LUTHER H [[id number]]     3. S/P hysterectomy  4. Decreased libido   5. History of regular medication use On veozah will check CMP for liver and kidney function  - Comprehensive metabolic panel     Plan:     Follow up in 3 months or sooner if needed

## 2022-05-15 ENCOUNTER — Ambulatory Visit: Payer: 59 | Admitting: Adult Health

## 2022-05-15 LAB — COMPREHENSIVE METABOLIC PANEL
ALT: 30 IU/L (ref 0–32)
AST: 27 IU/L (ref 0–40)
Albumin/Globulin Ratio: 1.7 (ref 1.2–2.2)
Albumin: 3.8 g/dL (ref 3.8–4.9)
Alkaline Phosphatase: 81 IU/L (ref 44–121)
BUN/Creatinine Ratio: 16 (ref 9–23)
BUN: 14 mg/dL (ref 6–24)
Bilirubin Total: 0.3 mg/dL (ref 0.0–1.2)
CO2: 25 mmol/L (ref 20–29)
Calcium: 8.6 mg/dL — ABNORMAL LOW (ref 8.7–10.2)
Chloride: 105 mmol/L (ref 96–106)
Creatinine, Ser: 0.85 mg/dL (ref 0.57–1.00)
Globulin, Total: 2.2 g/dL (ref 1.5–4.5)
Glucose: 82 mg/dL (ref 70–99)
Potassium: 4.5 mmol/L (ref 3.5–5.2)
Sodium: 140 mmol/L (ref 134–144)
Total Protein: 6 g/dL (ref 6.0–8.5)
eGFR: 83 mL/min/{1.73_m2} (ref >59)

## 2022-05-16 ENCOUNTER — Encounter: Payer: Self-pay | Admitting: Psychiatry

## 2022-05-16 ENCOUNTER — Telehealth (INDEPENDENT_AMBULATORY_CARE_PROVIDER_SITE_OTHER): Payer: 59 | Admitting: Psychiatry

## 2022-05-16 DIAGNOSIS — F411 Generalized anxiety disorder: Secondary | ICD-10-CM

## 2022-05-16 DIAGNOSIS — F3341 Major depressive disorder, recurrent, in partial remission: Secondary | ICD-10-CM | POA: Diagnosis not present

## 2022-05-16 DIAGNOSIS — G4700 Insomnia, unspecified: Secondary | ICD-10-CM

## 2022-05-16 MED ORDER — LAMOTRIGINE 100 MG PO TABS: 200.0000 mg | ORAL_TABLET | Freq: Every day | ORAL | 1 refills | Status: DC

## 2022-05-20 ENCOUNTER — Other Ambulatory Visit: Payer: Self-pay | Admitting: Surgical

## 2022-05-29 ENCOUNTER — Other Ambulatory Visit (INDEPENDENT_AMBULATORY_CARE_PROVIDER_SITE_OTHER): Payer: Self-pay | Admitting: Family Medicine

## 2022-05-29 DIAGNOSIS — J3089 Other allergic rhinitis: Secondary | ICD-10-CM

## 2022-05-30 ENCOUNTER — Other Ambulatory Visit: Payer: Self-pay | Admitting: Internal Medicine

## 2022-05-30 DIAGNOSIS — M2550 Pain in unspecified joint: Secondary | ICD-10-CM

## 2022-05-30 NOTE — Telephone Encounter (Signed)
I will refuse this prescription due to her gastroenterologist discontinuing this medication because of esophagitis and GERD.

## 2022-06-05 ENCOUNTER — Ambulatory Visit (INDEPENDENT_AMBULATORY_CARE_PROVIDER_SITE_OTHER): Payer: 59 | Admitting: Family Medicine

## 2022-06-13 ENCOUNTER — Encounter: Payer: 59 | Admitting: Orthopedic Surgery

## 2022-06-18 ENCOUNTER — Other Ambulatory Visit: Payer: Self-pay | Admitting: Psychiatry
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
## 2022-06-21 ENCOUNTER — Telehealth: Payer: Self-pay

## 2022-06-21 ENCOUNTER — Other Ambulatory Visit: Payer: Self-pay | Admitting: Psychiatry

## 2022-06-21 MED ORDER — GABAPENTIN 100 MG PO CAPS: 100.0000 mg | ORAL_CAPSULE | Freq: Two times a day (BID) | ORAL | 1 refills | Status: DC

## 2022-06-21 NOTE — Telephone Encounter (Signed)
Pt.notified

## 2022-06-21 NOTE — Telephone Encounter (Signed)
pharamcy called requesting a refill on the gabapentin states that rx was last filled on 5-17 and it has no refills left.

## 2022-06-21 NOTE — Telephone Encounter (Signed)
Ordered

## 2022-06-22 ENCOUNTER — Encounter: Payer: Self-pay | Admitting: Orthopedic Surgery

## 2022-06-22 ENCOUNTER — Ambulatory Visit (INDEPENDENT_AMBULATORY_CARE_PROVIDER_SITE_OTHER): Payer: 59 | Admitting: Orthopedic Surgery

## 2022-06-22 DIAGNOSIS — M5412 Radiculopathy, cervical region: Secondary | ICD-10-CM
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
## 2022-06-27 ENCOUNTER — Encounter: Payer: Self-pay | Admitting: Neurology

## 2022-06-27 ENCOUNTER — Ambulatory Visit: Payer: 59 | Admitting: Neurology

## 2022-06-27 ENCOUNTER — Telehealth: Payer: 59 | Admitting: Psychiatry

## 2022-06-27 VITALS — BP 115/69 | HR 76 | Ht 62.0 in | Wt 140.0 lb

## 2022-06-27 DIAGNOSIS — R404 Transient alteration of awareness: Secondary | ICD-10-CM

## 2022-06-27 DIAGNOSIS — R4789 Other speech disturbances: Secondary | ICD-10-CM | POA: Diagnosis not present

## 2022-06-27 DIAGNOSIS — Z9884 Bariatric surgery status: Secondary | ICD-10-CM | POA: Diagnosis not present

## 2022-06-27 DIAGNOSIS — F422 Mixed obsessional thoughts and acts: Secondary | ICD-10-CM | POA: Diagnosis not present

## 2022-06-27 MED ORDER — LAMOTRIGINE 100 MG PO TABS: 200.0000 mg | ORAL_TABLET | Freq: Every day | ORAL | 1 refills | Status: DC

## 2022-06-27 MED ORDER — LEVETIRACETAM 250 MG PO TABS: 250.0000 mg | ORAL_TABLET | Freq: Two times a day (BID) | ORAL | 5 refills | Status: DC

## 2022-06-27 MED ORDER — GABAPENTIN 100 MG PO CAPS: 100.0000 mg | ORAL_CAPSULE | Freq: Two times a day (BID) | ORAL | 1 refills | Status: DC

## 2022-06-27 NOTE — Patient Instructions (Addendum)
ASSESSMENT AND PLAN 52 y.o. year old AA female patient , seen here with:    1)  repeated spells of LOA, not fully unaware( she can hear , feels,tastes  and smells )  but unable to respond,  aura onset 10 minutes or longer before spell reaches maximum, non motor seizures.  This patient had been on 3 anti -epileptica at the time. Breviact was discussed -but is not insurance covered by [employer] ) .   2)hypersomnia dx in 2019 by MSLT, no REM sleep onset, average sleep latency was 5 minutes   3) concern about non-epileptic events. I suggested today to repeat EEG and follow with an ambulatory EEG or EMU stay.  EEG was ordered, Ambulatory EEG was ordered to follow. ( Dr Teresa Coombs).   The patient was told she cannot drive for a period of 6 months. This includes operating machinery  such as chain saws, riding lawnmower, working in heights ( roofer) or at a location with a risk of falling into a pit. SUDEP information printed.   I plan to follow up either personally or through our NP within 3 months.   Melvyn Novas, MD
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
## 2022-06-28 LAB — LAMOTRIGINE LEVEL: Lamotrigine Lvl: 5.3 ug/mL (ref 2.0–20.0)

## 2022-06-28 LAB — LEVETIRACETAM LEVEL: Levetiracetam Lvl: 2.5 ug/mL — ABNORMAL LOW (ref 10.0–40.0)

## 2022-07-02 ENCOUNTER — Encounter: Payer: Self-pay | Admitting: Anesthesiology

## 2022-07-11 ENCOUNTER — Ambulatory Visit: Payer: 59 | Admitting: Neurology

## 2022-07-11 DIAGNOSIS — Z9884 Bariatric surgery status: Secondary | ICD-10-CM

## 2022-07-11 DIAGNOSIS — R4182 Altered mental status, unspecified: Secondary | ICD-10-CM

## 2022-07-11 DIAGNOSIS — R4789 Other speech disturbances: Secondary | ICD-10-CM

## 2022-07-11 DIAGNOSIS — F422 Mixed obsessional thoughts and acts: Secondary | ICD-10-CM

## 2022-07-11 NOTE — Procedures (Signed)
    History:  52 year old woman with spells of speech arrest   EEG classification: Awake and drowsy  Description of the recording: The background rhythms of this recording consists of a fairly well modulated medium amplitude alpha rhythm of 10 Hz that is reactive to eye opening and closure. Present in the anterior head region is a 15-20 Hz beta activity. Photic stimulation was performed, did not show any abnormalities. Hyperventilation was also performed, did not show any abnormalities. Drowsiness was manifested by background fragmentation. No abnormal epileptiform discharges seen during this recording. There was no focal slowing. There were no electrographic seizure identified.   Abnormality: None   Impression: This is a normal EEG recorded while drowsy and awake. No evidence of interictal epileptiform discharges. Normal EEGs, however, do not rule out epilepsy.    Windell Norfolk, MD [facility]

## 2022-07-12 ENCOUNTER — Encounter: Payer: Self-pay | Admitting: Neurology

## 2022-07-12 ENCOUNTER — Encounter (INDEPENDENT_AMBULATORY_CARE_PROVIDER_SITE_OTHER): Payer: Self-pay | Admitting: Family Medicine

## 2022-07-12 ENCOUNTER — Other Ambulatory Visit (HOSPITAL_COMMUNITY): Payer: Self-pay

## 2022-07-12 ENCOUNTER — Ambulatory Visit (INDEPENDENT_AMBULATORY_CARE_PROVIDER_SITE_OTHER): Payer: 59 | Admitting: Family Medicine

## 2022-07-12 VITALS — BP 114/73 | HR 73 | Temp 97.9°F | Ht 62.0 in | Wt 135.0 lb

## 2022-07-12 DIAGNOSIS — E669 Obesity, unspecified: Secondary | ICD-10-CM | POA: Diagnosis not present

## 2022-07-12 DIAGNOSIS — R632 Polyphagia: Secondary | ICD-10-CM

## 2022-07-12 DIAGNOSIS — Z6824 Body mass index (BMI) 24.0-24.9, adult: Secondary | ICD-10-CM | POA: Diagnosis not present

## 2022-07-12 MED ORDER — TRULICITY 3 MG/0.5ML ~~LOC~~ SOAJ
SUBCUTANEOUS | 0 refills | Status: DC
Start: 2022-07-12 — End: 2022-10-11
  Filled 2022-07-12 – 2022-08-03 (×3): qty 2, 28d supply, fill #0
  Filled 2022-08-30 – 2022-09-20 (×4): qty 2, 28d supply, fill #1

## 2022-07-12 NOTE — Progress Notes (Signed)
.smr  Office: [phone]  /  Fax: [phone]  WEIGHT SUMMARY AND BIOMETRICS  Anthropometric Measurements Height: 5\' 2"  (1.575 m) Weight: 135 lb (61.2 kg) BMI (Calculated): 24.69 Weight at Last Visit: 137 lb Weight Lost Since Last Visit: 2 lb Weight Gained Since Last Visit: 0 Starting Weight: 217 lb Total Weight Loss (lbs): 79 lb (35.8 kg)   Body Composition  Body Fat %: 31.8 % Fat Mass (lbs): 43 lbs Muscle Mass (lbs): 87.6 lbs Total Body Water (lbs): 65.6 lbs Visceral Fat Rating : 6   Other Clinical Data Today's Visit #: 28 Starting Date: 09/14/19    Chief Complaint: OBESITY   Discussed the use of AI scribe software for clinical note transcription with the patient, who gave verbal consent to proceed.  History of Present Illness   The patient is a 52 year old individual with a history of obesity and polyphasia, currently managed with Trulicity. She reports a modest weight loss of two pounds over the past four months. Adherence to a pescetarian diet has been inconsistent, with compliance approximately 50% of the time. The patient's exercise regimen consists of walking for about 30 minutes once a week.  The patient identifies snacking as a significant issue, driven by both hunger and cravings. She acknowledges difficulties with meal planning and preparation, attributing this to convenience. The patient lives with her spouse, who also struggles with snacking, potentially influencing the patient's dietary habits.  The patient reports intermittent access to Trulicity, sometimes going up to two months without the medication. During these periods, she notices an increase in snacking and eating, which she attempts to control. The patient expresses a desire for assistance with meal planning to get back on track with her dietary management.  The patient's son, a Systems analyst, has advised her to increase protein intake and start weight lifting. She acknowledges the importance  of these recommendations but struggles with implementing them consistently. She has been given an exercise band to assist with upper body exercises at home.  The patient is planning a trip to [place] at the end of the month. She has not visited the location before and is looking forward to the trip.          PHYSICAL EXAM:  Blood pressure 114/73, pulse 73, temperature 97.9 F (36.6 C), height 5\' 2"  (1.575 m), weight 135 lb (61.2 kg), last menstrual period 09/01/2020, SpO2 99%. Body mass index is 24.69 kg/m.  DIAGNOSTIC DATA REVIEWED:  BMET    Component Value Date/Time   NA 140 05/14/2022 1103   K 4.5 05/14/2022 1103   CL 105 05/14/2022 1103   CO2 25 05/14/2022 1103   GLUCOSE 82 05/14/2022 1103   GLUCOSE 92 07/21/2021 0911   BUN 14 05/14/2022 1103   CREATININE 0.85 05/14/2022 1103   CREATININE 1.00 08/05/2019 0904   CALCIUM 8.6 (L) 05/14/2022 1103   GFRNONAA >60 07/21/2021 0911   GFRNONAA 66 08/05/2019 0904   GFRAA 94 02/22/2020 0839   GFRAA 77 08/05/2019 0904   Lab Results  Component Value Date   HGBA1C 5.1 01/16/2022   HGBA1C 5.3 08/05/2019   Lab Results  Component Value Date   INSULIN 4.8 01/16/2022   INSULIN 5.9 09/14/2019   Lab Results  Component Value Date   TSH 2.170 01/16/2022   CBC    Component Value Date/Time   WBC 4.2 07/21/2021 0911   RBC 3.98 07/21/2021 0911   HGB 12.1 07/21/2021 0911   HGB 13.3 10/22/2019 1054   HCT 38.0 07/21/2021  0911   HCT 40.0 10/22/2019 1054   PLT 325 07/21/2021 0911   PLT 349 10/22/2019 1054   MCV 95.5 07/21/2021 0911   MCV 92 10/22/2019 1054   MCH 30.4 07/21/2021 0911   MCHC 31.8 07/21/2021 0911   RDW 13.7 07/21/2021 0911   RDW 13.0 10/22/2019 1054   Iron Studies    Component Value Date/Time   IRON 111 09/03/2019 0809   TIBC 292 09/03/2019 0809   FERRITIN 187 09/03/2019 0809   IRONPCTSAT 38 09/03/2019 0809   Lipid Panel     Component Value Date/Time   CHOL 194 01/16/2022 1004   TRIG 54 01/16/2022  1004   HDL 71 01/16/2022 1004   CHOLHDL 3.0 03/24/2020 1235   CHOLHDL 3.4 08/05/2019 0904   VLDL 13 05/13/2012 1737   LDLCALC 113 (H) 01/16/2022 1004   LDLCALC 127 (H) 08/05/2019 0904   Hepatic Function Panel     Component Value Date/Time   PROT 6.0 05/14/2022 1103   ALBUMIN 3.8 05/14/2022 1103   AST 27 05/14/2022 1103   ALT 30 05/14/2022 1103   ALKPHOS 81 05/14/2022 1103   BILITOT 0.3 05/14/2022 1103   BILIDIR 0.1 07/16/2017 0640   IBILI 0.6 07/16/2017 0640      Component Value Date/Time   TSH 2.170 01/16/2022 1004   Nutritional Lab Results  Component Value Date   VD25OH 41.5 01/16/2022   VD25OH 48.5 01/11/2021   VD25OH 80.1 09/14/2020     Assessment and Plan    Obesity : Patient has lost 2 pounds over the last 4 months. Struggling with meal planning and snacking. Currently on Trulicity 3mg  weekly for polyphagia. -Encouraged to increase protein intake and consider weight lifting exercises. Provided exercise band and demonstrated exercises. -Provided Category 2 eating plan for additional variety and snack options. Advised not to mix and match plans on the same day. -Encouraged to continue with meal planning and grocery shopping.   Polyphagia: stable on Trulicity when available at the pharmacy -Continue Trulicity 3mg  weekly. Refill for 90 days sent to [facility] pharmacy due to reported shortages at [facility].  Follow-up in 3 months with fasting labs.         I have personally spent 30 minutes total time today in preparation, patient care, and documentation for this visit, including the following: review of clinical lab tests; review of medical tests/procedures/services.    She was informed of the importance of frequent follow up visits to maximize her success with intensive lifestyle modifications for her multiple health conditions.    Quillian Quince, MD

## 2022-07-13 ENCOUNTER — Ambulatory Visit (INDEPENDENT_AMBULATORY_CARE_PROVIDER_SITE_OTHER): Payer: 59 | Admitting: Orthopedic Surgery

## 2022-07-13 ENCOUNTER — Telehealth: Payer: Self-pay | Admitting: *Deleted

## 2022-07-13 ENCOUNTER — Encounter: Payer: Self-pay | Admitting: Orthopedic Surgery

## 2022-07-13 DIAGNOSIS — M5412 Radiculopathy, cervical region: Secondary | ICD-10-CM | POA: Diagnosis not present

## 2022-07-13 NOTE — Telephone Encounter (Signed)
noted 
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
## 2022-07-13 NOTE — Progress Notes (Signed)
Amb EEG order form completed
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
## 2022-07-13 NOTE — Telephone Encounter (Signed)
Patient is recall for a call to schedule TCS in August

## 2022-07-13 NOTE — Telephone Encounter (Signed)
Added to call

## 2022-07-17 ENCOUNTER — Encounter: Payer: Self-pay | Admitting: Internal Medicine

## 2022-07-21 ENCOUNTER — Encounter: Payer: Self-pay | Admitting: Internal Medicine

## 2022-07-23 ENCOUNTER — Other Ambulatory Visit: Payer: Self-pay | Admitting: Internal Medicine

## 2022-07-23 DIAGNOSIS — Z87898 Personal history of other specified conditions: Secondary | ICD-10-CM

## 2022-07-23 MED ORDER — SCOPOLAMINE 1 MG/3DAYS TD PT72
1.0000 | MEDICATED_PATCH | TRANSDERMAL | 12 refills | Status: DC
  Filled 2022-11-21 – 2022-12-20 (×3): qty 10, 30d supply, fill #0
  Filled 2023-01-25: qty 10, 30d supply, fill #1
  Filled 2023-02-19: qty 10, 30d supply, fill #2
  Filled 2023-03-21: qty 10, 30d supply, fill #3
  Filled 2023-04-25: qty 10, 30d supply, fill #4
  Filled 2023-05-21: qty 10, 30d supply, fill #5
  Filled 2023-06-24: qty 10, 30d supply, fill #6
  Filled 2023-07-21: qty 10, 30d supply, fill #7

## 2022-08-01 ENCOUNTER — Other Ambulatory Visit (INDEPENDENT_AMBULATORY_CARE_PROVIDER_SITE_OTHER): Payer: Self-pay | Admitting: Family Medicine

## 2022-08-01 DIAGNOSIS — R632 Polyphagia: Secondary | ICD-10-CM

## 2022-08-03 ENCOUNTER — Other Ambulatory Visit (HOSPITAL_COMMUNITY): Payer: Self-pay

## 2022-08-03 ENCOUNTER — Encounter (INDEPENDENT_AMBULATORY_CARE_PROVIDER_SITE_OTHER): Payer: Self-pay | Admitting: Family Medicine
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
## 2022-08-06 ENCOUNTER — Other Ambulatory Visit: Payer: Self-pay

## 2022-08-06 ENCOUNTER — Other Ambulatory Visit: Payer: Self-pay | Admitting: Internal Medicine

## 2022-08-06 DIAGNOSIS — R110 Nausea: Secondary | ICD-10-CM

## 2022-08-06 MED ORDER — ONDANSETRON 4 MG PO TBDP
4.0000 mg | ORAL_TABLET | Freq: Three times a day (TID) | ORAL | 0 refills | Status: DC | PRN
Start: 2022-08-06 — End: 2022-08-29

## 2022-08-07 ENCOUNTER — Other Ambulatory Visit (HOSPITAL_COMMUNITY): Payer: Self-pay

## 2022-08-08 ENCOUNTER — Emergency Department (HOSPITAL_COMMUNITY): Payer: 59

## 2022-08-08 ENCOUNTER — Other Ambulatory Visit: Payer: Self-pay

## 2022-08-08 ENCOUNTER — Emergency Department (HOSPITAL_COMMUNITY)
Admission: EM | Admit: 2022-08-08 | Discharge: 2022-08-08 | Disposition: A | Discharge status: Home / Self Care | Payer: 59 | Attending: Student | Admitting: Student

## 2022-08-08 ENCOUNTER — Encounter (HOSPITAL_COMMUNITY): Payer: Self-pay | Admitting: Emergency Medicine

## 2022-08-08 DIAGNOSIS — Z1152 Encounter for screening for COVID-19: Secondary | ICD-10-CM | POA: Diagnosis not present

## 2022-08-08 DIAGNOSIS — R55 Syncope and collapse: Secondary | ICD-10-CM | POA: Diagnosis not present

## 2022-08-08 DIAGNOSIS — D72819 Decreased white blood cell count, unspecified: Secondary | ICD-10-CM | POA: Diagnosis not present

## 2022-08-08 DIAGNOSIS — R569 Unspecified convulsions: Secondary | ICD-10-CM | POA: Diagnosis not present

## 2022-08-08 DIAGNOSIS — E119 Type 2 diabetes mellitus without complications: Secondary | ICD-10-CM | POA: Diagnosis not present

## 2022-08-08 DIAGNOSIS — I10 Essential (primary) hypertension: Secondary | ICD-10-CM | POA: Insufficient documentation

## 2022-08-08 LAB — URINALYSIS, ROUTINE W REFLEX MICROSCOPIC
Bilirubin Urine: NEGATIVE
Glucose, UA: NEGATIVE mg/dL
Hgb urine dipstick: NEGATIVE
Ketones, ur: NEGATIVE mg/dL
Leukocytes,Ua: NEGATIVE
Nitrite: NEGATIVE
Protein, ur: NEGATIVE mg/dL
Specific Gravity, Urine: 1.021 (ref 1.005–1.030)
pH: 5 (ref 5.0–8.0)

## 2022-08-08 LAB — RESP PANEL BY RT-PCR (RSV, FLU A&B, COVID)  RVPGX2
Influenza A by PCR: NEGATIVE
Influenza B by PCR: NEGATIVE
Resp Syncytial Virus by PCR: NEGATIVE
SARS Coronavirus 2 by RT PCR: NEGATIVE

## 2022-08-08 LAB — CBC
HCT: 39.2 % (ref 36.0–46.0)
Hemoglobin: 12.7 g/dL (ref 12.0–15.0)
MCH: 30.8 pg (ref 26.0–34.0)
MCHC: 32.4 g/dL (ref 30.0–36.0)
MCV: 94.9 fL (ref 80.0–100.0)
Platelets: 304 10*3/uL (ref 150–400)
RBC: 4.13 MIL/uL (ref 3.87–5.11)
RDW: 14.2 % (ref 11.5–15.5)
WBC: 3.6 10*3/uL — ABNORMAL LOW (ref 4.0–10.5)
nRBC: 0 % (ref 0.0–0.2)

## 2022-08-08 LAB — CBG MONITORING, ED: Glucose-Capillary: 72 mg/dL (ref 70–99)

## 2022-08-08 LAB — BASIC METABOLIC PANEL
Anion gap: 7 (ref 5–15)
BUN: 18 mg/dL (ref 6–20)
CO2: 27 mmol/L (ref 22–32)
Calcium: 8.4 mg/dL — ABNORMAL LOW (ref 8.9–10.3)
Chloride: 102 mmol/L (ref 98–111)
Creatinine, Ser: 0.8 mg/dL (ref 0.44–1.00)
GFR, Estimated: >60 mL/min (ref >60)
Glucose, Bld: 83 mg/dL (ref 70–99)
Potassium: 4.2 mmol/L (ref 3.5–5.1)
Sodium: 136 mmol/L (ref 135–145)

## 2022-08-08 MED ORDER — KETOROLAC TROMETHAMINE 15 MG/ML IJ SOLN
15.0000 mg | Freq: Once | INTRAMUSCULAR | Status: AC
  Administered 2022-08-08: 15 mg via INTRAVENOUS
  Filled 2022-08-08: qty 1

## 2022-08-08 MED ORDER — LEVETIRACETAM 250 MG PO TABS: 750.0000 mg | ORAL_TABLET | Freq: Two times a day (BID) | ORAL | 5 refills | Status: DC

## 2022-08-08 MED ORDER — LACTATED RINGERS IV BOLUS
1000.0000 mL | Freq: Once | INTRAVENOUS | Status: AC
  Administered 2022-08-08: 1000 mL via INTRAVENOUS

## 2022-08-08 NOTE — Progress Notes (Signed)
On-call neurologist note  Received a call from Dr. Glendora Score from [facility] regarding this patient Patient with a history of seizures, with concern for nonepileptic events, on Keppra and Lamictal as well as gabapentin, had a seizure-like episode at work yesterday.  Did not come yesterday because she hoped that the symptoms would improve.  She woke up this morning with a headache and felt that she may have another seizure for which she came to the ER.  Exam according to the ED provider is currently normal. I have reviewed the last note from [facility] neurology where there was concern for nonepileptic spells and discussion for EMU admission.  At this point, I would recommend increasing her morning and evening dose of Keppra a by 250 mg each dose, maintaining seizure precautions and having her follow-up with her outpatient neurologist in the next week or 2 for consideration of an EMU admission for spell characterization.  -- Milon Dikes, MD Neurologist [facility] Pager: [phone]  Approximately 8 minutes of time were spent reviewing the case and discussion with the calling provider, more than 50% of which was spent in discussion.
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
## 2022-08-08 NOTE — ED Triage Notes (Addendum)
Pt had a witness syncopal episode at work yesterday, lasting approx 15 minutes, per pt she didn't strike her head, didn't come yesterday because she felt better.  Today having a headache, history of seizures. Last seizure approx 2 months ago, currently taking Keppra.

## 2022-08-10 ENCOUNTER — Telehealth (INDEPENDENT_AMBULATORY_CARE_PROVIDER_SITE_OTHER): Payer: 59 | Admitting: Psychiatry

## 2022-08-10 ENCOUNTER — Encounter: Payer: Self-pay | Admitting: Neurology

## 2022-08-10 ENCOUNTER — Encounter: Payer: Self-pay | Admitting: Psychiatry

## 2022-08-10 DIAGNOSIS — F3341 Major depressive disorder, recurrent, in partial remission: Secondary | ICD-10-CM

## 2022-08-10 DIAGNOSIS — G4700 Insomnia, unspecified: Secondary | ICD-10-CM

## 2022-08-10 DIAGNOSIS — F411 Generalized anxiety disorder: Secondary | ICD-10-CM | POA: Diagnosis not present

## 2022-08-10 NOTE — Patient Instructions (Signed)
Continue lamotrigine 200 mg daily  EKG reviewed- HR 66, QTc 424 msec  08/2022 Plan to increase gabapentin 100 mg daily, 300 mg at night if no concern from your neurologist Next appointment: 10/18 at 11 30

## 2022-08-14 ENCOUNTER — Encounter: Payer: Self-pay | Admitting: Orthopedic Surgery

## 2022-08-14 ENCOUNTER — Ambulatory Visit: Payer: 59 | Admitting: Adult Health

## 2022-08-14 ENCOUNTER — Other Ambulatory Visit: Payer: Self-pay | Admitting: Neurology

## 2022-08-14 ENCOUNTER — Other Ambulatory Visit: Payer: Self-pay | Admitting: Adult Health

## 2022-08-14 NOTE — Telephone Encounter (Signed)
Your insurance is recommending you to have at least 6 weeks for PT before they will approve the MRI and then you will need to call us to let us know if after the PT if it has help or worse, so that we can document it in your chart and send it to insurance.

## 2022-08-25 ENCOUNTER — Other Ambulatory Visit: Payer: Self-pay | Admitting: Gastroenterology

## 2022-08-27 ENCOUNTER — Other Ambulatory Visit: Payer: Self-pay | Admitting: Internal Medicine

## 2022-08-27 DIAGNOSIS — M2550 Pain in unspecified joint: Secondary | ICD-10-CM

## 2022-08-28 ENCOUNTER — Ambulatory Visit: Payer: 59 | Admitting: Neurology

## 2022-08-29 ENCOUNTER — Other Ambulatory Visit (INDEPENDENT_AMBULATORY_CARE_PROVIDER_SITE_OTHER): Payer: Self-pay | Admitting: Family Medicine

## 2022-08-29 ENCOUNTER — Other Ambulatory Visit: Payer: Self-pay | Admitting: Internal Medicine

## 2022-08-29 DIAGNOSIS — J3089 Other allergic rhinitis: Secondary | ICD-10-CM

## 2022-08-29 DIAGNOSIS — M2550 Pain in unspecified joint: Secondary | ICD-10-CM

## 2022-08-29 DIAGNOSIS — R110 Nausea: Secondary | ICD-10-CM

## 2022-08-29 LAB — HM DIABETES EYE EXAM

## 2022-08-30 MED ORDER — DICLOFENAC SODIUM 1 % EX GEL: 4.0000 g | Freq: Four times a day (QID) | CUTANEOUS | 0 refills | Status: DC

## 2022-08-30 MED ORDER — ONDANSETRON 4 MG PO TBDP: 4.0000 mg | ORAL_TABLET | Freq: Three times a day (TID) | ORAL | 0 refills | Status: DC | PRN

## 2022-09-04 ENCOUNTER — Telehealth (INDEPENDENT_AMBULATORY_CARE_PROVIDER_SITE_OTHER): Payer: Self-pay | Admitting: Family Medicine

## 2022-09-04 ENCOUNTER — Encounter (INDEPENDENT_AMBULATORY_CARE_PROVIDER_SITE_OTHER): Payer: Self-pay

## 2022-09-04 NOTE — Telephone Encounter (Signed)
I contacted the patient to reschedule the appointment with Dr. Dalbert Garnet on 10/15/2022 as she is currently out of the office. Sent mychart message as well

## 2022-09-05 NOTE — Telephone Encounter (Signed)
I contacted the patient to reschedule the appointment with Dr. Dalbert Garnet on 10/15/2022 as she is currently out of the office. Sent mychart message as well

## 2022-09-08 ENCOUNTER — Other Ambulatory Visit: Payer: Self-pay | Admitting: Surgical

## 2022-09-08 ENCOUNTER — Other Ambulatory Visit: Payer: Self-pay | Admitting: Psychiatry

## 2022-09-08 ENCOUNTER — Other Ambulatory Visit: Payer: Self-pay | Admitting: Internal Medicine

## 2022-09-08 ENCOUNTER — Other Ambulatory Visit: Payer: Self-pay | Admitting: Neurology

## 2022-09-08 DIAGNOSIS — M2550 Pain in unspecified joint: Secondary | ICD-10-CM

## 2022-09-10 ENCOUNTER — Other Ambulatory Visit (HOSPITAL_COMMUNITY): Payer: Self-pay

## 2022-09-10 ENCOUNTER — Ambulatory Visit: Payer: 59 | Admitting: Internal Medicine

## 2022-09-10 ENCOUNTER — Other Ambulatory Visit: Payer: Self-pay | Admitting: Internal Medicine

## 2022-09-10 ENCOUNTER — Other Ambulatory Visit: Payer: Self-pay | Admitting: Surgical

## 2022-09-10 NOTE — Telephone Encounter (Signed)
Please reschedule appt, Thanks

## 2022-09-11 ENCOUNTER — Encounter: Payer: Self-pay | Admitting: Internal Medicine

## 2022-09-11 ENCOUNTER — Other Ambulatory Visit (HOSPITAL_COMMUNITY): Payer: Self-pay

## 2022-09-13 ENCOUNTER — Ambulatory Visit: Payer: 59 | Admitting: Adult Health

## 2022-09-14 ENCOUNTER — Other Ambulatory Visit (HOSPITAL_COMMUNITY): Payer: Self-pay

## 2022-09-17 ENCOUNTER — Ambulatory Visit: Payer: 59 | Admitting: Adult Health

## 2022-09-18 ENCOUNTER — Telehealth (INDEPENDENT_AMBULATORY_CARE_PROVIDER_SITE_OTHER): Payer: Self-pay | Admitting: Family Medicine

## 2022-09-18 ENCOUNTER — Other Ambulatory Visit (HOSPITAL_COMMUNITY): Payer: Self-pay

## 2022-09-18 ENCOUNTER — Other Ambulatory Visit: Payer: Self-pay

## 2022-09-18 NOTE — Telephone Encounter (Signed)
Pt has been out of Trulicity for over two weeks. Pt states that her insurance company needs a prior authorization. Pt would like a call back concerning the prior authorization.

## 2022-09-19 ENCOUNTER — Other Ambulatory Visit: Payer: Self-pay

## 2022-09-19 ENCOUNTER — Other Ambulatory Visit (HOSPITAL_COMMUNITY): Payer: Self-pay

## 2022-09-20 ENCOUNTER — Telehealth (INDEPENDENT_AMBULATORY_CARE_PROVIDER_SITE_OTHER): Payer: 59 | Admitting: Internal Medicine

## 2022-09-20 ENCOUNTER — Encounter: Payer: Self-pay | Admitting: Internal Medicine

## 2022-09-20 ENCOUNTER — Other Ambulatory Visit (HOSPITAL_COMMUNITY): Payer: Self-pay

## 2022-09-20 DIAGNOSIS — Z862 Personal history of diseases of the blood and blood-forming organs and certain disorders involving the immune mechanism: Secondary | ICD-10-CM | POA: Diagnosis not present

## 2022-09-20 DIAGNOSIS — E1169 Type 2 diabetes mellitus with other specified complication: Secondary | ICD-10-CM

## 2022-09-20 DIAGNOSIS — Z7985 Long-term (current) use of injectable non-insulin antidiabetic drugs: Secondary | ICD-10-CM

## 2022-09-20 DIAGNOSIS — F411 Generalized anxiety disorder: Secondary | ICD-10-CM | POA: Diagnosis not present

## 2022-09-20 DIAGNOSIS — F410 Panic disorder [episodic paroxysmal anxiety] without agoraphobia: Secondary | ICD-10-CM

## 2022-09-20 DIAGNOSIS — R6889 Other general symptoms and signs: Secondary | ICD-10-CM

## 2022-09-20 DIAGNOSIS — R5382 Chronic fatigue, unspecified: Secondary | ICD-10-CM

## 2022-09-20 NOTE — Assessment & Plan Note (Signed)
Unclear etiology, but could be due to medication She c/o cold intolerance, but also has been prescribed Veozah for hot flashes (?) Unclear if anxiolytic is contributing to cold sensation Check CBC and TSH

## 2022-09-20 NOTE — Telephone Encounter (Signed)
RE: Brooke [name] approved your request for coverage of Trulicity 3MG /0.5ML [place] SOPN.  Dear Brooke [name]:  Brooke [name] pleased to let you know that we've approved your or your doctor's request for coverage for Trulicity 3MG /0.5ML [place] SOPN. You can now fill your prescription, and it will be covered according to your plan.  As long as you remain covered by your prescription drug plan and there are no changes to your plan benefits, this request is approved from 09/20/2022 to 09/20/2023. When this approval expires, please speak to your doctor about your treatment.  Patient has been notified.

## 2022-09-20 NOTE — Assessment & Plan Note (Signed)
Followed by psychiatry Currently on controlled with BuSpar, ? Lamictal and gabapentin Advised to discuss with psychiatry for her concern of ADHD, although unlikely at her age as new onset ADHD

## 2022-09-20 NOTE — Assessment & Plan Note (Signed)
Lab Results  Component Value Date   HGBA1C 5.1 01/16/2022   Wel-controlled Associated with HLD On Trulicity 3 mg qw for weight loss benefit, followed by weight loss clinic -needs to check blood glucose when she feels dizzy, concern for hypoglycemic episodes Advised to follow diabetic diet F/u CMP and lipid panel later Diabetic eye exam: Advised to follow up with Ophthalmology for diabetic eye exam
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
## 2022-09-20 NOTE — Telephone Encounter (Signed)
New Prior Auth started for Trulicity.  Awaiting response.

## 2022-09-20 NOTE — Patient Instructions (Addendum)
Please start taking ferrous sulfate 325 mg once daily. Okay to take Vitamin B12 500 mcg once daily.  Please get blood tests done after 2 months.  Please maintain adequate hydration by taking at least 64 ounces of fluid in a day and eat at regular intervals.  Please check blood glucose once daily before breakfast and contact if it is below 70.

## 2022-09-24 ENCOUNTER — Encounter: Payer: Self-pay | Admitting: Internal Medicine

## 2022-09-24 ENCOUNTER — Encounter: Payer: Self-pay | Admitting: Neurology

## 2022-09-24 ENCOUNTER — Other Ambulatory Visit: Payer: Self-pay | Admitting: Internal Medicine

## 2022-09-24 ENCOUNTER — Other Ambulatory Visit: Payer: Self-pay | Admitting: Neurology

## 2022-09-24 DIAGNOSIS — M2550 Pain in unspecified joint: Secondary | ICD-10-CM

## 2022-09-24 MED ORDER — LEVETIRACETAM 750 MG PO TABS
750.0000 mg | ORAL_TABLET | Freq: Two times a day (BID) | ORAL | 5 refills | Status: DC
Start: 2022-09-24 — End: 2023-03-21
  Filled 2022-11-21 (×2): qty 60, 30d supply, fill #0
  Filled 2022-12-20: qty 60, 30d supply, fill #1
  Filled 2023-01-25: qty 60, 30d supply, fill #2
  Filled 2023-02-19: qty 60, 30d supply, fill #3

## 2022-09-25 ENCOUNTER — Encounter: Payer: Self-pay | Admitting: Internal Medicine

## 2022-10-05 ENCOUNTER — Encounter: Payer: Self-pay | Admitting: Emergency Medicine

## 2022-10-05 ENCOUNTER — Ambulatory Visit
Admission: EM | Admit: 2022-10-05 | Discharge: 2022-10-05 | Disposition: A | Discharge status: Home / Self Care | Payer: 59 | Attending: Family Medicine | Admitting: Family Medicine

## 2022-10-05 DIAGNOSIS — Z1152 Encounter for screening for COVID-19: Secondary | ICD-10-CM | POA: Diagnosis not present

## 2022-10-05 DIAGNOSIS — R519 Headache, unspecified: Secondary | ICD-10-CM | POA: Insufficient documentation

## 2022-10-05 DIAGNOSIS — R6883 Chills (without fever): Secondary | ICD-10-CM | POA: Insufficient documentation

## 2022-10-05 DIAGNOSIS — R52 Pain, unspecified: Secondary | ICD-10-CM

## 2022-10-05 LAB — POCT INFLUENZA A/B
Influenza A, POC: NEGATIVE
Influenza B, POC: NEGATIVE

## 2022-10-05 NOTE — ED Triage Notes (Signed)
Body aches, headache, chills nausea that started today.
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
## 2022-10-05 NOTE — Discharge Instructions (Signed)
Your flu test was negative today.  Your COVID test should be back tomorrow and someone will call if positive.

## 2022-10-06 LAB — SARS CORONAVIRUS 2 (TAT 6-24 HRS): SARS Coronavirus 2: NEGATIVE

## 2022-10-07 ENCOUNTER — Other Ambulatory Visit: Payer: Self-pay | Admitting: Internal Medicine

## 2022-10-07 DIAGNOSIS — M2550 Pain in unspecified joint: Secondary | ICD-10-CM

## 2022-10-07 NOTE — Progress Notes (Unsigned)
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 Former    Current packs/day: 0.00    Average packs/day: 0.3 packs/day for 23.0 years (6.9 ttl pk-yrs)    Types: Cigarettes    Start date: 10/04/1989    Quit date: 10/04/2012    Years since quitting: 10.0   Smokeless tobacco: Never  Vaping Use   Vaping status: Never Used  Substance and Sexual Activity   Alcohol use: Yes    Comment: weekends; hardly/social   Drug use: No   Sexual activity: Yes    Partners: Male    Birth control/protection: Surgical    Comment: tubal/hyst  Other Topics Concern   Not on file  Social History Narrative   Right handed   1 cup coffee per day, 1 cup tea per day   Lives with husband, married 26 years    55 son Teron    71 son Leslee Home -two grandchildren    Live close by    Rising cousin-custody of her daughter 28 [address]       Right handed   Pets: none      Enjoys: [employer], shopping, likes being outside       Diet: eggs, oatmeal, salad, all food groups no lot of proteins, good on veggies.    Caffeine: sweet tea-2 cups  Coffee-1 cup daily    Water: 2-3 16 oz bottles daily       Wears seat belt    Smoke and carbon  monoxide detectors   Does use phone while driving but hands free   Social Determinants of Health   Financial Resource Strain: Medium Risk (02/07/2022)   Overall Financial Resource Strain (CARDIA)    Difficulty of Paying Living Expenses: Somewhat hard  Food Insecurity: Food Insecurity Present (02/07/2022)   Hunger Vital Sign    Worried About Running Out of Food in the Last Year: Sometimes true    Ran Out of Food in the Last Year: Never true  Transportation Needs: No Transportation Needs (02/07/2022)   PRAPARE - Administrator, Civil Service (Medical): No    Lack of Transportation (Non-Medical): No  Physical Activity:  Insufficiently Active (02/07/2022)   Exercise Vital Sign    Days of Exercise per Week: 1 day    Minutes of Exercise per Session: 30 min  Stress: Stress Concern Present (02/07/2022)   [employer] of Occupational Health - Occupational Stress Questionnaire    Feeling of Stress : Very much  Social Connections: Moderately Integrated (02/07/2022)   Social Connection and Isolation Panel [NHANES]    Frequency of Communication with Friends and Family: More than three times a week    Frequency of Social Gatherings with Friends and Family: Twice a week    Attends Religious Services: More than 4 times per year    Active Member of [employer] or Organizations: No    Attends Banker Meetings: Never    Marital Status: Married  Catering manager Violence: Not At Risk (02/07/2022)   Humiliation, Afraid, Rape, and Kick questionnaire    Fear of Current or Ex-Partner: No    Emotionally Abused: No    Physically Abused: No    Sexually Abused: No      PHYSICAL EXAM  There were no vitals filed for this visit.  There is no height or weight on file to calculate BMI.  Generalized: Well developed, in no acute distress  HEENT: Both ear canals are clear, no fluid or bulging noted.   Neurological examination  Mentation: Alert oriented to time, place, history taking. Follows  all commands speech and language fluent Cranial nerve II-XII: Pupils were equal round reactive to light. Extraocular movements were full, visual field were full on confrontational test. Facial sensation and strength were normal. Uvula tongue midline. Head turning and shoulder shrug  were normal and symmetric. Motor: The motor testing reveals 5 over 5 strength of all 4 extremities. Good symmetric motor tone is noted throughout.  Sensory: Sensory testing is intact to soft touch on all 4 extremities. No evidence of extinction is noted.  Coordination: Cerebellar testing reveals good finger-nose-finger and heel-to-shin bilaterally.  Gait and station: Gait is normal.  Reflexes: Deep tendon reflexes are symmetric and normal bilaterally.   DIAGNOSTIC DATA (LABS, IMAGING, TESTING) - I reviewed patient records, labs, notes, testing and imaging myself where available.  Lab Results  Component Value Date   WBC 3.6 (L) 08/08/2022   HGB 12.7 08/08/2022   HCT 39.2 08/08/2022   MCV 94.9 08/08/2022   PLT 304 08/08/2022      Component Value Date/Time   NA 136 08/08/2022 1230   NA 140 05/14/2022 1103   K 4.2 08/08/2022 1230   CL 102 08/08/2022 1230   CO2 27 08/08/2022 1230   GLUCOSE 83 08/08/2022 1230   BUN 18 08/08/2022 1230   BUN 14 05/14/2022 1103   CREATININE 0.80 08/08/2022 1230   CREATININE 1.00 08/05/2019 0904   CALCIUM 8.4 (L) 08/08/2022 1230   PROT 6.0 05/14/2022 1103   ALBUMIN 3.8 05/14/2022 1103   AST 27 05/14/2022 1103   ALT 30 05/14/2022 1103   ALKPHOS 81 05/14/2022 1103   BILITOT 0.3 05/14/2022 1103   GFRNONAA >60 08/08/2022 1230   GFRNONAA 66 08/05/2019 0904   GFRAA 94 02/22/2020 0839   GFRAA 77 08/05/2019 0904   Lab Results  Component Value Date   CHOL 194 01/16/2022   HDL 71 01/16/2022   LDLCALC 113 (H) 01/16/2022   TRIG 54 01/16/2022   CHOLHDL 3.0 03/24/2020   Lab Results  Component Value Date   HGBA1C 5.1 01/16/2022   Lab Results  Component Value Date    VITAMINB12  1,144 09/14/2019   Lab Results  Component Value Date   TSH 2.170 01/16/2022      ASSESSMENT AND PLAN 52 y.o. year old female  has a past medical history of Acid reflux, Amenorrhea (02/06/2012), Anxiety, Arthritis, B12 deficiency, Breast lump (08/06/2019), Cervical radiculitis, Chest pain, Chronic constipation, DDD (degenerative disc disease), lumbar, Deep venous thrombosis (HCC) (08/09/2021), Depression, Depression, Dizzy spells, Elevated vitamin B12 level (05/04/2019), Esophageal dysphagia (11/19/2012), Facial numbness, Family history of systemic lupus erythematosus (10/22/2019), Fatty liver, GERD (gastroesophageal reflux disease), H. pylori infection, Headache(784.0) (04/01/2012), Heart palpitations (01/2017), High cholesterol, History of anemia, History of hiatal hernia, Hypersomnia due to another medical condition (06/12/2017), Hypertension, Insomnia, Intractable migraine with visual aura and without status migrainosus (01/23/2017), Iron deficiency anemia, Irregular periods (08/06/2019), Joint pain, Lactose intolerance, LUQ pain (11/19/2012), Menopausal symptom (08/06/2019), Migraines, Near syncope (01/2017), Numbness and tingling (10/08/2016), Numbness and tingling in left arm, Obesity, Other malaise and fatigue (05/19/2012), Panic attacks, Raynaud's phenomenon without gangrene (10/22/2019), Sciatica, Seizures (HCC), Slurred speech (11/07/2016), Small bowel obstruction (HCC) (07/20/2017), Spells of speech arrest (01/23/2017), Transient cerebral ischemia (10/08/2016), Vitamin D deficiency, and Word finding difficulty (01/23/2017). here with:  1.  Migraine headaches  Continue monthly Aimovig injection Continue Imitrex as abortive therapy currently prescribed by PCP  2.  Right-sided neck pain  Reviewed recent cervical MRI with Dr. Vickey Huger.  After discussion with Dr. Vickey Huger conservative management is recommended.  Discussed with the patient about doing physical therapy, Flexeril at  bedtime and consider a massage.  Patient is interested in trying Flexeril and massage. Start Flexeril 5 mg at bedtime She reports that if the massage is not helpful she will call back for an order for physical therapy In regards to fullness in the ear advised that if this continues to follow-up with PCP Follow-up in 6 months or sooner if needed  **Discussed with Dr. Vickey Huger after the visit then I called the patient on 03/28/21 at 4:10 PM to review above recommendations   Butch Penny, MSN, NP-C 10/07/2022, 1:29 PM [facility] [address], [address] [place], [place] [address] [phone]

## 2022-10-08 ENCOUNTER — Telehealth (INDEPENDENT_AMBULATORY_CARE_PROVIDER_SITE_OTHER): Payer: 59 | Admitting: Psychiatry

## 2022-10-08 ENCOUNTER — Ambulatory Visit (INDEPENDENT_AMBULATORY_CARE_PROVIDER_SITE_OTHER): Payer: 59 | Admitting: Adult Health

## 2022-10-08 ENCOUNTER — Encounter: Payer: Self-pay | Admitting: Psychiatry

## 2022-10-08 ENCOUNTER — Encounter: Payer: Self-pay | Admitting: Adult Health

## 2022-10-08 VITALS — BP 133/85 | HR 62 | Ht 62.0 in | Wt 146.8 lb

## 2022-10-08 DIAGNOSIS — R569 Unspecified convulsions: Secondary | ICD-10-CM | POA: Diagnosis not present

## 2022-10-08 DIAGNOSIS — G4700 Insomnia, unspecified: Secondary | ICD-10-CM

## 2022-10-08 DIAGNOSIS — F411 Generalized anxiety disorder: Secondary | ICD-10-CM

## 2022-10-08 DIAGNOSIS — F330 Major depressive disorder, recurrent, mild: Secondary | ICD-10-CM | POA: Diagnosis not present
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
## 2022-10-08 NOTE — Progress Notes (Deleted)
[facility] [address] [place] [address] Dept: [phone] Dept Fax: [phone]  Psychotherapy Progress Note  Patient ID: VERA GEIGER, female  DOB: 07/14/1970, 52 y.o.  MRN: [id number]  10/08/2022 Start time: *** End time: ***  Method of Visit: {Method of Visit:27865}  Present: {family members:20773}  Current Concerns: ***  Current Symptoms: {Current [phone]}  Psychiatric Specialty Exam: General Appearance: {Appearance:22683}  Eye Contact:  {BHH EYE CONTACT:22684}  Speech:  {Speech:22685}  Volume:  {Volume (PAA):22686}  Mood:  {BHH MOOD:22306}  Affect:  {Affect (PAA):22687}  Thought Process:  {Thought Process (PAA):22688}  Orientation:  {BHH ORIENTATION (PAA):22689}  Thought Content:  {Thought Content:22690}  Suicidal Thoughts:  {ST/HT (PAA):22692}  Homicidal Thoughts:  {ST/HT (PAA):22692}  Memory:  {BHH MEMORY:22881}  Judgement:  {Judgement (PAA):22694}  Insight:  {Insight (PAA):22695}  Psychomotor Activity:  {Psychomotor (PAA):22696}  Concentration:  {Concentration:21399}  Recall:  {BHH GOOD/FAIR/POOR:22877}  Fund of Knowledge:{BHH GOOD/FAIR/POOR:22877}  Language: {BHH GOOD/FAIR/POOR:22877}  Akathisia:  {BHH YES OR NO:22294}  Handed:  {Handed:22697}  AIMS (if indicated):  {Desc; done/not:10129}  Assets:  {Assets (PAA):22698}  ADL's:  {BHH OZD'G:64403}  Cognition: {chl bhh cognition:304700322}  Sleep:  {BHH GOOD/FAIR/POOR:22877}     Diagnosis: ***  Anticipated Frequency of Visits: *** Anticipated Length of Treatment Episode: ***  Short Term Goals/Goals for Treatment Session: *** Progress Towards Goals: {Progress Towards Goals:21014066}  Treatment Intervention: {Treatment Intervention:[phone]}  Medical Necessity: {Medical Necessity:210140004}  Assessment Tools:    09/20/2022    8:48 AM 05/14/2022   10:45 AM 03/20/2022    4:34 PM   Depression screen PHQ 2/9  Decreased Interest 0 0 3  Down, Depressed, Hopeless 0 1 1  PHQ - 2 Score 0 1 4  Altered sleeping  0   Tired, decreased energy  1 2  Change in appetite  0 0  Feeling bad or failure about yourself   1 0  Trouble concentrating  2 2  Moving slowly or fidgety/restless  0 0  Suicidal thoughts  0 0  PHQ-9 Score  5   Difficult doing work/chores   Somewhat difficult   Failed to redirect to the Timeline version of the REVFS SmartLink. Flowsheet Row ED from 10/05/2022 in [facility] ED from 08/08/2022 in [facility] at [facility] ED from 11/09/2021 in [facility] at [facility] RISK CATEGORY No Risk No Risk No Risk       Collaboration of Care: [facility] OP Collaboration of Care:[id number]}  Patient/Guardian was advised Release of Information must be obtained prior to any record release in order to collaborate their care with an outside provider. Patient/Guardian was advised if they have not already done so to contact the registration department to sign all necessary forms in order for [place] to release information regarding their care.   Consent: Patient/Guardian gives verbal consent for treatment and assignment of benefits for services provided during this visit. Patient/Guardian expressed understanding and agreed to proceed.   Plan: [name], MD 10/08/2022

## 2022-10-08 NOTE — Telephone Encounter (Signed)
Could you contact her to see if she can do a visit today? Virtual is fine, thanks

## 2022-10-08 NOTE — Patient Instructions (Addendum)
Your Plan:  Continue Lamictal, Keppra and gabapentin My nurse will reach out to you regarding your 72-hour EEG.  You may be responsible for calling your insurance company.     Thank you for coming to see [place] at [facility]. I hope we have been able to provide you high quality care today.  You may receive a patient satisfaction survey over the next few weeks. We would appreciate your feedback and comments so that we may continue to improve ourselves and the health of our patients.

## 2022-10-08 NOTE — Patient Instructions (Addendum)
Continue lamotrigine 200 mg daily  Plan to increase gabapentin 100 mg daily, 300 mg at night after communicating with your neurologist Next appointment: 11/27 at 11 am

## 2022-10-10 DIAGNOSIS — R41840 Attention and concentration deficit: Secondary | ICD-10-CM

## 2022-10-11 ENCOUNTER — Encounter (INDEPENDENT_AMBULATORY_CARE_PROVIDER_SITE_OTHER): Payer: Self-pay | Admitting: Family Medicine

## 2022-10-11 ENCOUNTER — Other Ambulatory Visit (HOSPITAL_COMMUNITY): Payer: Self-pay

## 2022-10-11 ENCOUNTER — Ambulatory Visit (INDEPENDENT_AMBULATORY_CARE_PROVIDER_SITE_OTHER): Payer: 59

## 2022-10-11 ENCOUNTER — Ambulatory Visit (INDEPENDENT_AMBULATORY_CARE_PROVIDER_SITE_OTHER): Payer: 59 | Admitting: Family Medicine

## 2022-10-11 VITALS — BP 94/58 | HR 50 | Temp 98.0°F | Ht 62.0 in | Wt 142.0 lb

## 2022-10-11 DIAGNOSIS — Z23 Encounter for immunization: Secondary | ICD-10-CM

## 2022-10-11 DIAGNOSIS — Z7985 Long-term (current) use of injectable non-insulin antidiabetic drugs: Secondary | ICD-10-CM

## 2022-10-11 DIAGNOSIS — E1169 Type 2 diabetes mellitus with other specified complication: Secondary | ICD-10-CM | POA: Diagnosis not present

## 2022-10-11 DIAGNOSIS — Z794 Long term (current) use of insulin: Secondary | ICD-10-CM | POA: Diagnosis not present

## 2022-10-11 DIAGNOSIS — Z6826 Body mass index (BMI) 26.0-26.9, adult: Secondary | ICD-10-CM | POA: Diagnosis not present

## 2022-10-11 DIAGNOSIS — E782 Mixed hyperlipidemia: Secondary | ICD-10-CM

## 2022-10-11 DIAGNOSIS — E559 Vitamin D deficiency, unspecified: Secondary | ICD-10-CM | POA: Diagnosis not present

## 2022-10-11 DIAGNOSIS — Z9884 Bariatric surgery status: Secondary | ICD-10-CM | POA: Diagnosis not present

## 2022-10-11 DIAGNOSIS — E669 Obesity, unspecified: Secondary | ICD-10-CM | POA: Diagnosis not present

## 2022-10-11 MED ORDER — TRULICITY 3 MG/0.5ML ~~LOC~~ SOAJ
SUBCUTANEOUS | 0 refills | Status: DC
Start: 2022-10-11 — End: 2022-11-19
  Filled 2022-10-11 – 2022-10-17 (×2): qty 2, 28d supply, fill #0
  Filled 2022-11-14: qty 2, 28d supply, fill #1

## 2022-10-11 NOTE — Progress Notes (Signed)
Chief Complaint:   OBESITY Brooke [name] is here to discuss her progress with her obesity treatment plan along with follow-up of her obesity related diagnoses. Brooke [name] is on the [employer] and states she is following her eating plan approximately 50% of the time. Brooke [name] states she is walking for 60 minutes 2 times per week.  Today's visit was #: 29 Starting weight: 214 lbs Starting date: 09/14/2019 Today's weight: 142 lbs Today's date: 10/11/2022 Total lbs lost to date: 72 Total lbs lost since last in-office visit: 0  Interim History: Patient has struggled with weight loss in the last 3 months.  She notes increased stress and decreased meal planning has made weight loss difficult.  Subjective:   1. Type 2 diabetes mellitus with other specified complication, with long-term current use of insulin (HCC) Patient is doing well on Trulicity with no nausea or vomiting.  She is working on her diet but she is struggling more.  2. Mixed hyperlipidemia Patient is working on decreasing cholesterol in her diet, but she is struggling more with weight loss.  3. Vitamin D deficiency Patient is not on vitamin D, and she is due to have her labs rechecked.  She notes fatigue.  4. Status post bariatric surgery Patient is due for labs, and she needs her B12 checked.  Assessment/Plan:   1. Type 2 diabetes mellitus with other specified complication, with long-term current use of insulin (HCC) We will check labs today, and we will refill Trulicity 3 mg once weekly for 90 days.  Emotional eating behavior strategies were discussed with the patient, and we will follow-up at her next visit in 1 month.  - Dulaglutide (TRULICITY) 3 MG/0.5ML SOPN; INJECT 3 MG INTO THE SKIN ONE TIME PER WEEK  Dispense: 6 mL; Refill: 0 - CMP14+EGFR - Insulin, random - Hemoglobin A1c  2. Mixed hyperlipidemia We will check labs today.  Patient will continue with her diet, and we will follow-up at her next visit in 1  month.  - Lipid Panel With LDL/HDL Ratio  3. Vitamin D deficiency We will check labs today, and we will follow-up at patient's next visit in 1 month.  - VITAMIN D 25 Hydroxy (Vit-D Deficiency, Fractures)  4. Status post bariatric surgery We will check labs today, and we will follow-up at patient's next visit in 1 month.  - Vitamin B12  5. BMI 26.0-26.9,adult  6. Obesity, Beginning BMI 39.69 Brooke [name] is currently in the action stage of change. As such, her goal is to maintain weight for now. She has agreed to the [employer].   Patient will work on meal planning with a goal to maintain her weight for now.  Exercise goals: As is.   Behavioral modification strategies: decreasing eating out, meal planning and cooking strategies, and emotional eating strategies.  Brooke [name] has agreed to follow-up with our clinic in 4 weeks. She was informed of the importance of frequent follow-up visits to maximize her success with intensive lifestyle modifications for her multiple health conditions.   Brooke [name] was informed we would discuss her lab results at her next visit unless there is a critical issue that needs to be addressed sooner. Brooke [name] agreed to keep her next visit at the agreed upon time to discuss these results.  Objective:   Blood pressure (!) 94/58, pulse (!) 50, temperature 98 F (36.7 C), height 5\' 2"  (1.575 m), weight 142 lb (64.4 kg), last menstrual period 09/01/2020, SpO2 96%. Body mass index is 25.97 kg/m.  Lab Results  Component Value Date   CREATININE 0.80 08/08/2022   BUN 18 08/08/2022   NA 136 08/08/2022   K 4.2 08/08/2022   CL 102 08/08/2022   CO2 27 08/08/2022   Lab Results  Component Value Date   ALT 30 05/14/2022   AST 27 05/14/2022   ALKPHOS 81 05/14/2022   BILITOT 0.3 05/14/2022   Lab Results  Component Value Date   HGBA1C 5.1 01/16/2022   HGBA1C 5.0 01/11/2021   HGBA1C 5.3 09/22/2020   HGBA1C 5.2 08/26/2020   HGBA1C 5.1 03/24/2020   Lab Results   Component Value Date   INSULIN 4.8 01/16/2022   INSULIN 7.9 01/11/2021   INSULIN 5.0 09/14/2020   INSULIN 5.1 03/24/2020   INSULIN 5.9 09/14/2019   Lab Results  Component Value Date   TSH 2.170 01/16/2022   Lab Results  Component Value Date   CHOL 194 01/16/2022   HDL 71 01/16/2022   LDLCALC 113 (H) 01/16/2022   TRIG 54 01/16/2022   CHOLHDL 3.0 03/24/2020   Lab Results  Component Value Date   VD25OH 41.5 01/16/2022   VD25OH 48.5 01/11/2021   VD25OH 80.1 09/14/2020   Lab Results  Component Value Date   WBC 3.6 (L) 08/08/2022   HGB 12.7 08/08/2022   HCT 39.2 08/08/2022   MCV 94.9 08/08/2022   PLT 304 08/08/2022   Lab Results  Component Value Date   IRON 111 09/03/2019   TIBC 292 09/03/2019   FERRITIN 187 09/03/2019   Attestation Statements:   Reviewed by clinician on day of visit: allergies, medications, problem list, medical history, surgical history, family history, social history, and previous encounter notes.   I, Burt Knack, am acting as transcriptionist for Quillian Quince, MD.  I have reviewed the above documentation for accuracy and completeness, and I agree with the above. -  Quillian Quince, MD

## 2022-10-12 LAB — CMP14+EGFR
ALT: 35 [IU]/L — ABNORMAL HIGH (ref 0–32)
AST: 30 [IU]/L (ref 0–40)
Albumin: 4 g/dL (ref 3.8–4.9)
Alkaline Phosphatase: 71 [IU]/L (ref 44–121)
BUN/Creatinine Ratio: 12 (ref 9–23)
BUN: 10 mg/dL (ref 6–24)
Bilirubin Total: 0.3 mg/dL (ref 0.0–1.2)
CO2: 25 mmol/L (ref 20–29)
Calcium: 8.8 mg/dL (ref 8.7–10.2)
Chloride: 103 mmol/L (ref 96–106)
Creatinine, Ser: 0.81 mg/dL (ref 0.57–1.00)
Globulin, Total: 1.9 g/dL (ref 1.5–4.5)
Glucose: 83 mg/dL (ref 70–99)
Potassium: 4.7 mmol/L (ref 3.5–5.2)
Sodium: 141 mmol/L (ref 134–144)
Total Protein: 5.9 g/dL — ABNORMAL LOW (ref 6.0–8.5)
eGFR: 87 mL/min/{1.73_m2} (ref >59)

## 2022-10-12 LAB — HEMOGLOBIN A1C
Est. average glucose Bld gHb Est-mCnc: 100 mg/dL
Hgb A1c MFr Bld: 5.1 % (ref 4.8–5.6)

## 2022-10-12 LAB — LIPID PANEL WITH LDL/HDL RATIO
Cholesterol, Total: 179 mg/dL (ref 100–199)
HDL: 72 mg/dL (ref >39)
LDL Chol Calc (NIH): 99 mg/dL (ref 0–99)
LDL/HDL Ratio: 1.4 {ratio} (ref 0.0–3.2)
Triglycerides: 36 mg/dL (ref 0–149)
VLDL Cholesterol Cal: 8 mg/dL (ref 5–40)

## 2022-10-12 LAB — VITAMIN D 25 HYDROXY (VIT D DEFICIENCY, FRACTURES): Vit D, 25-Hydroxy: 59 ng/mL (ref 30.0–100.0)

## 2022-10-12 LAB — INSULIN, RANDOM: INSULIN: 5.2 u[IU]/mL (ref 2.6–24.9)

## 2022-10-12 LAB — VITAMIN B12: Vitamin B-12: >2000 pg/mL — ABNORMAL HIGH (ref 232–1245)

## 2022-10-15 ENCOUNTER — Ambulatory Visit (INDEPENDENT_AMBULATORY_CARE_PROVIDER_SITE_OTHER): Payer: 59 | Admitting: Family Medicine

## 2022-10-17 ENCOUNTER — Other Ambulatory Visit: Payer: Self-pay

## 2022-10-17 LAB — HM DIABETES EYE EXAM

## 2022-10-18 ENCOUNTER — Telehealth: Payer: Self-pay | Admitting: Psychiatry

## 2022-10-18 MED ORDER — GABAPENTIN 300 MG PO CAPS: 300.0000 mg | ORAL_CAPSULE | Freq: Every day | ORAL | 1 refills | Status: DC

## 2022-10-18 NOTE — Telephone Encounter (Signed)
Please contact the patient. I communicated with her neurologist, Dr. Porfirio Mylar, and will plan to increase the gabapentin dosage to 100 mg daily and 300 mg at night. Let her know that I ordered 300 mg capsules to the pharmacy. Although I believe she has 100 mg capsules, please confirm if she needs them.Marland Kitchen

## 2022-10-19 ENCOUNTER — Other Ambulatory Visit: Payer: Self-pay | Admitting: Psychiatry

## 2022-10-19 ENCOUNTER — Telehealth: Payer: 59 | Admitting: Psychiatry

## 2022-10-19 MED ORDER — GABAPENTIN 100 MG PO CAPS
100.0000 mg | ORAL_CAPSULE | Freq: Every day | ORAL | 1 refills | Status: DC
  Filled 2022-11-21: qty 30, 30d supply, fill #0

## 2022-10-19 NOTE — Telephone Encounter (Signed)
Called patient to make aware of the dosage change no answer left voicemail for patient to return call to office

## 2022-10-23 ENCOUNTER — Other Ambulatory Visit: Payer: Self-pay | Admitting: Internal Medicine

## 2022-10-30 ENCOUNTER — Other Ambulatory Visit: Payer: Self-pay | Admitting: Internal Medicine

## 2022-10-30 DIAGNOSIS — M2550 Pain in unspecified joint: Secondary | ICD-10-CM

## 2022-11-05 ENCOUNTER — Other Ambulatory Visit: Payer: Self-pay | Admitting: Adult Health

## 2022-11-19 ENCOUNTER — Other Ambulatory Visit (INDEPENDENT_AMBULATORY_CARE_PROVIDER_SITE_OTHER): Payer: Self-pay | Admitting: Family Medicine

## 2022-11-19 ENCOUNTER — Encounter (INDEPENDENT_AMBULATORY_CARE_PROVIDER_SITE_OTHER): Payer: Self-pay | Admitting: Family Medicine

## 2022-11-19 ENCOUNTER — Ambulatory Visit (INDEPENDENT_AMBULATORY_CARE_PROVIDER_SITE_OTHER): Payer: 59 | Admitting: Family Medicine

## 2022-11-19 VITALS — BP 114/67 | HR 73 | Temp 98.2°F | Ht 62.0 in | Wt 139.0 lb

## 2022-11-19 DIAGNOSIS — E46 Unspecified protein-calorie malnutrition: Secondary | ICD-10-CM | POA: Diagnosis not present

## 2022-11-19 DIAGNOSIS — Z6825 Body mass index (BMI) 25.0-25.9, adult: Secondary | ICD-10-CM | POA: Insufficient documentation

## 2022-11-19 DIAGNOSIS — R7401 Elevation of levels of liver transaminase levels: Secondary | ICD-10-CM | POA: Diagnosis not present

## 2022-11-19 DIAGNOSIS — E1169 Type 2 diabetes mellitus with other specified complication: Secondary | ICD-10-CM

## 2022-11-19 DIAGNOSIS — E669 Obesity, unspecified: Secondary | ICD-10-CM | POA: Diagnosis not present

## 2022-11-19 DIAGNOSIS — Z794 Long term (current) use of insulin: Secondary | ICD-10-CM

## 2022-11-19 DIAGNOSIS — Z7985 Long-term (current) use of injectable non-insulin antidiabetic drugs: Secondary | ICD-10-CM | POA: Diagnosis not present

## 2022-11-19 MED ORDER — TRULICITY 3 MG/0.5ML ~~LOC~~ SOAJ
3.0000 mg | SUBCUTANEOUS | 0 refills | Status: DC
Start: 2022-11-19 — End: 2022-12-18
  Filled 2022-11-21 – 2022-12-03 (×3): qty 2, 28d supply, fill #0

## 2022-11-19 NOTE — Progress Notes (Signed)
.smr  Office: [phone]  /  Fax: [phone]  WEIGHT SUMMARY AND BIOMETRICS  Anthropometric Measurements Height: 5\' 2"  (1.575 m) Weight: 139 lb (63 kg) BMI (Calculated): 25.42 Weight at Last Visit: 142 lb Weight Lost Since Last Visit: 3 lb Weight Gained Since Last Visit: 3 lb Starting Weight: 217 lb Total Weight Loss (lbs): 75 lb (34 kg)   Body Composition  Body Fat %: 34.8 % Fat Mass (lbs): 48.6 lbs Muscle Mass (lbs): 86.4 lbs Total Body Water (lbs): 71.6 lbs Visceral Fat Rating : 7   Other Clinical Data Fasting: no Labs: no Today's Visit #: 30 Starting Date: 09/14/19    Chief Complaint: OBESITY History of Present Illness   The patient, diagnosed with type two diabetes and obesity, presents for a routine follow-up and medication refill. She reports adherence to a pescetarian diet approximately fifty percent of the time and has lost three pounds in the last month. She has been managing her diabetes with Trulicity and has not experienced any gastrointestinal side effects.  The patient has been engaging in regular physical activity, walking for about half an hour, three times per week. She reports a decrease in hunger levels and an overall improvement in her health. However, she also notes some stress due to the time of year.  The patient has been taking Z-Quil to aid with sleep, which she reports as effective. She also reports a decrease in protein levels despite efforts to increase protein intake through diet and supplements. She has stopped taking B12 supplements due to high levels in recent lab results.  The patient also has a history of back issues, for which she has previously received epidural injections. She expresses interest in incorporating core strengthening exercises into her routine to help manage this condition.          PHYSICAL EXAM:  Blood pressure 114/67, pulse 73, temperature 98.2 F (36.8 C), height 5\' 2"  (1.575 m), weight 139 lb (63 kg), last  menstrual period 09/01/2020, SpO2 100%. Body mass index is 25.42 kg/m.  DIAGNOSTIC DATA REVIEWED:  BMET    Component Value Date/Time   NA 141 10/11/2022 0801   K 4.7 10/11/2022 0801   CL 103 10/11/2022 0801   CO2 25 10/11/2022 0801   GLUCOSE 83 10/11/2022 0801   GLUCOSE 83 08/08/2022 1230   BUN 10 10/11/2022 0801   CREATININE 0.81 10/11/2022 0801   CREATININE 1.00 08/05/2019 0904   CALCIUM 8.8 10/11/2022 0801   GFRNONAA >60 08/08/2022 1230   GFRNONAA 66 08/05/2019 0904   GFRAA 94 02/22/2020 0839   GFRAA 77 08/05/2019 0904   Lab Results  Component Value Date   HGBA1C 5.1 10/11/2022   HGBA1C 5.3 08/05/2019   Lab Results  Component Value Date   INSULIN 5.2 10/11/2022   INSULIN 5.9 09/14/2019   Lab Results  Component Value Date   TSH 2.170 01/16/2022   CBC    Component Value Date/Time   WBC 3.6 (L) 08/08/2022 1230   RBC 4.13 08/08/2022 1230   HGB 12.7 08/08/2022 1230   HGB 13.3 10/22/2019 1054   HCT 39.2 08/08/2022 1230   HCT 40.0 10/22/2019 1054   PLT 304 08/08/2022 1230   PLT 349 10/22/2019 1054   MCV 94.9 08/08/2022 1230   MCV 92 10/22/2019 1054   MCH 30.8 08/08/2022 1230   MCHC 32.4 08/08/2022 1230   RDW 14.2 08/08/2022 1230   RDW 13.0 10/22/2019 1054   Iron Studies    Component Value Date/Time  IRON 111 09/03/2019 0809   TIBC 292 09/03/2019 0809   FERRITIN 187 09/03/2019 0809   IRONPCTSAT 38 09/03/2019 0809   Lipid Panel     Component Value Date/Time   CHOL 179 10/11/2022 0801   TRIG 36 10/11/2022 0801   HDL 72 10/11/2022 0801   CHOLHDL 3.0 03/24/2020 1235   CHOLHDL 3.4 08/05/2019 0904   VLDL 13 05/13/2012 1737   LDLCALC 99 10/11/2022 0801   LDLCALC 127 (H) 08/05/2019 0904   Hepatic Function Panel     Component Value Date/Time   PROT 5.9 (L) 10/11/2022 0801   ALBUMIN 4.0 10/11/2022 0801   AST 30 10/11/2022 0801   ALT 35 (H) 10/11/2022 0801   ALKPHOS 71 10/11/2022 0801   BILITOT 0.3 10/11/2022 0801   BILIDIR 0.1 07/16/2017 0640    IBILI 0.6 07/16/2017 0640      Component Value Date/Time   TSH 2.170 01/16/2022 1004   Nutritional Lab Results  Component Value Date   VD25OH 59.0 10/11/2022   VD25OH 41.5 01/16/2022   VD25OH 48.5 01/11/2021     Assessment and Plan    Type 2 Diabetes Mellitus Well-controlled with Trulicity. A1c is 5.1, indicating excellent glycemic control. No gastrointestinal issues reported. Actively engaged in lifestyle modifications including diet and exercise. Discussed the importance of maintaining current regimen to reduce the risk of complications such as neuropathy, retinopathy, and cardiovascular disease. - Refill Trulicity - Continue current diet and exercise regimen  Obesity Recent weight loss of 3 pounds. BMI is 25. Following a pescetarian diet 50% of the time and walking for exercise three times per week. Discussed portion control during holiday meals and adding core strengthening exercises. Continued weight loss can improve overall health and reduce the risk of comorbidities such as hypertension and cardiovascular disease. - Continue pescetarian diet - Continue walking for exercise - Encourage portion control during holiday meals - Consider adding core strengthening exercises  Low Protein Levels Decreasing protein levels despite increased intake through drinks, bars, chicken, and fish. Possible malabsorption issue. Discussed the need for continued high-protein diet and monitoring protein levels in future labs to assess effectiveness and adjust as needed. - Continue high-protein diet, goal of at least 75gm of protein daily - Monitor protein levels in future labs  Elevated Liver Enzymes Slightly elevated liver enzymes, though improved compared to previous levels. Explained that persistent elevation could indicate liver dysfunction and the importance of regular monitoring. - Monitor liver enzymes in future labs, 3-4 months   Follow-up - Confirm December 17 appointment - Schedule  January appointment.        She was informed of the importance of frequent follow up visits to maximize her success with intensive lifestyle modifications for her multiple health conditions.    Quillian Quince, MD

## 2022-11-20 ENCOUNTER — Other Ambulatory Visit (HOSPITAL_COMMUNITY): Payer: Self-pay

## 2022-11-21 ENCOUNTER — Other Ambulatory Visit (HOSPITAL_COMMUNITY): Payer: Self-pay

## 2022-11-21 ENCOUNTER — Other Ambulatory Visit: Payer: Self-pay | Admitting: Internal Medicine

## 2022-11-21 MED FILL — Lamotrigine Tab 100 MG: ORAL | 30 days supply | Qty: 60 | Fill #0 | Status: CN

## 2022-11-21 MED FILL — Buspirone HCl Tab 5 MG: ORAL | 30 days supply | Qty: 90 | Fill #0 | Status: CN

## 2022-11-21 MED FILL — Methocarbamol Tab 500 MG: ORAL | 10 days supply | Qty: 30 | Fill #0 | Status: CN

## 2022-11-21 MED FILL — Methocarbamol Tab 500 MG: ORAL | 10 days supply | Qty: 30 | Fill #0 | Status: AC

## 2022-11-22 ENCOUNTER — Other Ambulatory Visit: Payer: Self-pay

## 2022-11-22 ENCOUNTER — Telehealth (INDEPENDENT_AMBULATORY_CARE_PROVIDER_SITE_OTHER): Payer: Self-pay | Admitting: *Deleted

## 2022-11-22 NOTE — Telephone Encounter (Signed)
PA SUBMITTED FOR TRULICITY. AWAITING DETERMINATION FROM [employer].   Brooke [name] (Key: [id number])  Your information has been submitted to [employer]. [employer] will review the request and will issue a decision, typically within 1-3 days from your submission. You can check the updated outcome later by reopening this request.  If [employer] has not responded in 1-3 days or if you have any questions about your ePA request, please contact [employer] at [phone]. If you think there may be a problem with your PA request, use our live chat feature at the bottom right.

## 2022-11-23 ENCOUNTER — Other Ambulatory Visit (HOSPITAL_COMMUNITY): Payer: Self-pay
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
## 2022-11-26 NOTE — Telephone Encounter (Signed)
The PA request for [employer] was closed.  Your request has been n/a Your PA request has been closed. [phone]

## 2022-11-28 ENCOUNTER — Encounter: Payer: Self-pay | Admitting: Psychiatry

## 2022-11-28 ENCOUNTER — Other Ambulatory Visit (HOSPITAL_COMMUNITY): Payer: Self-pay

## 2022-11-28 ENCOUNTER — Telehealth (INDEPENDENT_AMBULATORY_CARE_PROVIDER_SITE_OTHER): Payer: 59 | Admitting: Psychiatry

## 2022-11-28 ENCOUNTER — Encounter: Payer: Self-pay | Admitting: Pharmacist

## 2022-11-28 ENCOUNTER — Other Ambulatory Visit: Payer: Self-pay

## 2022-11-28 ENCOUNTER — Other Ambulatory Visit: Payer: Self-pay | Admitting: Internal Medicine

## 2022-11-28 DIAGNOSIS — F411 Generalized anxiety disorder: Secondary | ICD-10-CM | POA: Diagnosis not present

## 2022-11-28 DIAGNOSIS — R110 Nausea: Secondary | ICD-10-CM

## 2022-11-28 DIAGNOSIS — F330 Major depressive disorder, recurrent, mild: Secondary | ICD-10-CM | POA: Diagnosis not present

## 2022-11-28 MED ORDER — ONDANSETRON 4 MG PO TBDP
4.0000 mg | ORAL_TABLET | Freq: Three times a day (TID) | ORAL | 0 refills | Status: DC | PRN
  Filled 2022-11-28: qty 18, 6d supply, fill #0
  Filled 2022-12-10: qty 18, 6d supply, fill #1

## 2022-11-28 MED ORDER — GABAPENTIN 300 MG PO CAPS: 300.0000 mg | ORAL_CAPSULE | Freq: Every day | ORAL | 0 refills | Status: DC

## 2022-11-28 MED FILL — Lamotrigine Tab 100 MG: ORAL | 30 days supply | Qty: 60 | Fill #0 | Status: AC

## 2022-11-28 MED FILL — Buspirone HCl Tab 5 MG: ORAL | 30 days supply | Qty: 90 | Fill #0 | Status: AC

## 2022-12-03 ENCOUNTER — Other Ambulatory Visit: Payer: Self-pay

## 2022-12-06 ENCOUNTER — Other Ambulatory Visit (HOSPITAL_COMMUNITY): Payer: Self-pay

## 2022-12-06 ENCOUNTER — Encounter (INDEPENDENT_AMBULATORY_CARE_PROVIDER_SITE_OTHER): Payer: Self-pay | Admitting: Family Medicine

## 2022-12-08 ENCOUNTER — Ambulatory Visit
Admission: EM | Admit: 2022-12-08 | Discharge: 2022-12-08 | Disposition: A | Discharge status: Home / Self Care | Payer: 59 | Attending: Nurse Practitioner | Admitting: Nurse Practitioner

## 2022-12-08 DIAGNOSIS — J069 Acute upper respiratory infection, unspecified: Secondary | ICD-10-CM | POA: Diagnosis not present

## 2022-12-08 DIAGNOSIS — R059 Cough, unspecified: Secondary | ICD-10-CM | POA: Diagnosis not present

## 2022-12-08 MED ORDER — AZITHROMYCIN 250 MG PO TABS: 250.0000 mg | ORAL_TABLET | Freq: Every day | ORAL | 0 refills | Status: DC

## 2022-12-08 MED ORDER — FLUTICASONE PROPIONATE 50 MCG/ACT NA SUSP: 2.0000 | Freq: Every day | NASAL | 0 refills | Status: DC

## 2022-12-08 MED ORDER — PROMETHAZINE-DM 6.25-15 MG/5ML PO SYRP: 5.0000 mL | ORAL_SOLUTION | Freq: Four times a day (QID) | ORAL | 0 refills | Status: DC | PRN

## 2022-12-08 NOTE — Discharge Instructions (Signed)
Take medication as prescribed. Increase fluids and allow for plenty of rest. May take over-the-counter Tylenol or ibuprofen as needed for pain, fever, general discomfort. Recommend use of a humidifier in the bedroom at nighttime during sleep and sleeping elevated on pillows while cough symptoms persist. May use normal saline nasal spray throughout the day for nasal congestion and runny nose. For your cough, you may also use cough drops and increase your fluid intake to help with symptoms. If symptoms have not improved over the next 5 to 7 days, or suddenly worsen before that time, you may follow-up in this clinic or with your primary care physician for further evaluation. Follow-up as needed.

## 2022-12-08 NOTE — ED Triage Notes (Signed)
Pt c/o headache, cough, chest congestion, body aches and chill x 1 weeks.    Home interventions: mucinex, robitussin
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
## 2022-12-10 ENCOUNTER — Other Ambulatory Visit (HOSPITAL_COMMUNITY): Payer: Self-pay

## 2022-12-10 ENCOUNTER — Other Ambulatory Visit: Payer: Self-pay

## 2022-12-10 ENCOUNTER — Encounter: Payer: Self-pay | Admitting: Pharmacist

## 2022-12-10 ENCOUNTER — Other Ambulatory Visit: Payer: Self-pay | Admitting: Internal Medicine

## 2022-12-10 DIAGNOSIS — R110 Nausea: Secondary | ICD-10-CM

## 2022-12-10 MED ORDER — ONDANSETRON 4 MG PO TBDP
4.0000 mg | ORAL_TABLET | Freq: Three times a day (TID) | ORAL | 0 refills | Status: DC | PRN
  Filled 2022-12-10 – 2022-12-20 (×2): qty 18, 21d supply, fill #0
  Filled 2023-01-25: qty 18, 21d supply, fill #1

## 2022-12-11 ENCOUNTER — Other Ambulatory Visit (HOSPITAL_COMMUNITY): Payer: Self-pay

## 2022-12-11 MED FILL — Pantoprazole Sodium EC Tab 40 MG (Base Equiv): ORAL | 30 days supply | Qty: 60 | Fill #0 | Status: AC

## 2022-12-12 ENCOUNTER — Telehealth: Payer: Self-pay | Admitting: *Deleted

## 2022-12-12 NOTE — Telephone Encounter (Signed)
Prior authorization came back at N/A. We needed to submit the Prior authorization through the primary insurance first. I submitted the Prior authorization through her [place] health. If that gets denied then we can re submit to her medicaid and attach the denial and it should get approved.

## 2022-12-12 NOTE — Telephone Encounter (Signed)
Prior authorization done via cover my meds for patients Trulicity. Waiting on determination.

## 2022-12-18 ENCOUNTER — Other Ambulatory Visit (HOSPITAL_COMMUNITY): Payer: Self-pay

## 2022-12-18 ENCOUNTER — Encounter (INDEPENDENT_AMBULATORY_CARE_PROVIDER_SITE_OTHER): Payer: Self-pay | Admitting: Family Medicine

## 2022-12-18 ENCOUNTER — Ambulatory Visit (INDEPENDENT_AMBULATORY_CARE_PROVIDER_SITE_OTHER): Payer: 59 | Admitting: Family Medicine

## 2022-12-18 VITALS — BP 129/68 | HR 60 | Temp 98.0°F | Ht 62.0 in | Wt 139.0 lb

## 2022-12-18 DIAGNOSIS — Z6825 Body mass index (BMI) 25.0-25.9, adult: Secondary | ICD-10-CM

## 2022-12-18 DIAGNOSIS — Z7985 Long-term (current) use of injectable non-insulin antidiabetic drugs: Secondary | ICD-10-CM

## 2022-12-18 DIAGNOSIS — F410 Panic disorder [episodic paroxysmal anxiety] without agoraphobia: Secondary | ICD-10-CM

## 2022-12-18 DIAGNOSIS — G4700 Insomnia, unspecified: Secondary | ICD-10-CM

## 2022-12-18 DIAGNOSIS — E669 Obesity, unspecified: Secondary | ICD-10-CM | POA: Diagnosis not present

## 2022-12-18 DIAGNOSIS — F419 Anxiety disorder, unspecified: Secondary | ICD-10-CM | POA: Diagnosis not present

## 2022-12-18 DIAGNOSIS — G4709 Other insomnia: Secondary | ICD-10-CM

## 2022-12-18 DIAGNOSIS — Z794 Long term (current) use of insulin: Secondary | ICD-10-CM | POA: Diagnosis not present

## 2022-12-18 DIAGNOSIS — E1169 Type 2 diabetes mellitus with other specified complication: Secondary | ICD-10-CM

## 2022-12-18 DIAGNOSIS — E119 Type 2 diabetes mellitus without complications: Secondary | ICD-10-CM | POA: Diagnosis not present

## 2022-12-18 MED ORDER — TRULICITY 3 MG/0.5ML ~~LOC~~ SOAJ
3.0000 mg | SUBCUTANEOUS | 0 refills | Status: DC
  Filled 2022-12-18: qty 6, 84d supply, fill #0
  Filled 2023-01-03: qty 2, 28d supply, fill #0
  Filled 2023-02-04: qty 2, 28d supply, fill #1

## 2022-12-18 NOTE — Progress Notes (Signed)
.[facility]  Office: [phone]  /  Fax: [phone]  WEIGHT SUMMARY AND BIOMETRICS  Anthropometric Measurements Height: 5\' 2"  (1.575 m) Weight: 139 lb (63 kg) BMI (Calculated): 25.42 Weight at Last Visit: 139 lb Weight Lost Since Last Visit: 0 Weight Gained Since Last Visit: 0 Starting Weight: 217 lb Total Weight Loss (lbs): 75 lb (34 kg)   Body Composition  Body Fat %: 34 % Fat Mass (lbs): 47.4 lbs Muscle Mass (lbs): 87.2 lbs Total Body Water (lbs): 68.2 lbs Visceral Fat Rating : 7   Other Clinical Data Fasting: No Labs: No Today's Visit #: 31 Starting Date: 09/14/19    Chief Complaint: OBESITY   History of Present Illness   The patient, with a history of obesity and type 2 diabetes, presents for a follow-up visit. She underwent gastric bypass surgery in 2019, which led to diabetes remission. However, she has been on intermittent Trulicity due to insurance coverage issues. She has maintained her weight since the last visit a month ago and has been adhering to a pescetarian diet about half of the time. She reports engaging in 30 minutes of walking exercise four times a week.  Recently, the patient has been under significant stress due to her father's hospitalization and her mother's need for assistance with daily activities. She reports not eating well due to stress and experiencing panic attacks twice a week. She is currently on Buspar and Lamictal for anxiety management, but she does not believe these medications are helping. She has an appointment with her psychiatrist in February.  The patient also reports difficulty sleeping, describing her mind as "racing all the time." She has been using Nyquil to aid sleep, which helps but leaves her feeling groggy during the day.          PHYSICAL EXAM:  Blood pressure 129/68, pulse 60, temperature 98 F (36.7 C), height 5\' 2"  (1.575 m), weight 139 lb (63 kg), last menstrual period 09/01/2020, SpO2 100%. Body mass index is  25.42 kg/m.  DIAGNOSTIC DATA REVIEWED:  BMET    Component Value Date/Time   NA 141 10/11/2022 0801   K 4.7 10/11/2022 0801   CL 103 10/11/2022 0801   CO2 25 10/11/2022 0801   GLUCOSE 83 10/11/2022 0801   GLUCOSE 83 08/08/2022 1230   BUN 10 10/11/2022 0801   CREATININE 0.81 10/11/2022 0801   CREATININE 1.00 08/05/2019 0904   CALCIUM 8.8 10/11/2022 0801   GFRNONAA >60 08/08/2022 1230   GFRNONAA 66 08/05/2019 0904   GFRAA 94 02/22/2020 0839   GFRAA 77 08/05/2019 0904   Lab Results  Component Value Date   HGBA1C 5.1 10/11/2022   HGBA1C 5.3 08/05/2019   Lab Results  Component Value Date   INSULIN 5.2 10/11/2022   INSULIN 5.9 09/14/2019   Lab Results  Component Value Date   TSH 2.170 01/16/2022   CBC    Component Value Date/Time   WBC 3.6 (L) 08/08/2022 1230   RBC 4.13 08/08/2022 1230   HGB 12.7 08/08/2022 1230   HGB 13.3 10/22/2019 1054   HCT 39.2 08/08/2022 1230   HCT 40.0 10/22/2019 1054   PLT 304 08/08/2022 1230   PLT 349 10/22/2019 1054   MCV 94.9 08/08/2022 1230   MCV 92 10/22/2019 1054   MCH 30.8 08/08/2022 1230   MCHC 32.4 08/08/2022 1230   RDW 14.2 08/08/2022 1230   RDW 13.0 10/22/2019 1054   Iron Studies    Component Value Date/Time   IRON 111 09/03/2019 0809  TIBC 292 09/03/2019 0809   FERRITIN 187 09/03/2019 0809   IRONPCTSAT 38 09/03/2019 0809   Lipid Panel     Component Value Date/Time   CHOL 179 10/11/2022 0801   TRIG 36 10/11/2022 0801   HDL 72 10/11/2022 0801   CHOLHDL 3.0 03/24/2020 1235   CHOLHDL 3.4 08/05/2019 0904   VLDL 13 05/13/2012 1737   LDLCALC 99 10/11/2022 0801   LDLCALC 127 (H) 08/05/2019 0904   Hepatic Function Panel     Component Value Date/Time   PROT 5.9 (L) 10/11/2022 0801   ALBUMIN 4.0 10/11/2022 0801   AST 30 10/11/2022 0801   ALT 35 (H) 10/11/2022 0801   ALKPHOS 71 10/11/2022 0801   BILITOT 0.3 10/11/2022 0801   BILIDIR 0.1 07/16/2017 0640   IBILI 0.6 07/16/2017 0640      Component Value Date/Time    TSH 2.170 01/16/2022 1004   Nutritional Lab Results  Component Value Date   VD25OH 59.0 10/11/2022   VD25OH 41.5 01/16/2022   VD25OH 48.5 01/11/2021     Assessment and Plan    Anxiety with Panic Attacks Increased anxiety and panic attacks occurring at least twice a week, likely exacerbated by current stressors including caregiving responsibilities. Current medications (Buspar, Lamictal) are ineffective. Psychiatric consultation needed for medication adjustment and non-pharmacological interventions. - Contact psychiatrist for telehealth visit to discuss medication adjustments - Consider non-pharmacological interventions such as magnesium supplementation and audiobooks for sleep  Insomnia Difficulty sleeping due to racing thoughts. Nyquil helps but causes daytime grogginess. Discussed alternative options including magnesium citrate supplementation and audiobooks to improve sleep quality without causing grogginess. - Consider magnesium citrate supplementation (25 mg) for sleep - Try listening to audiobooks at night to help calm racing thoughts  Type 2 Diabetes Mellitus Type 2 Diabetes Mellitus previously in remission post-gastric bypass in 2019. Currently managed with Trulicity, which is intermittently covered by insurance. Discussed financial burden and potential for a 90-day supply to mitigate costs. - Refill Trulicity prescription and attempt to get a 90-day supply - Send prescription to [place] pharmacy  Obesity Obesity with maintained weight over the last month. Follows a pescetarian diet about 50% of the time and exercises 30 minutes four times per week. Discussed the importance of continuing current lifestyle changes to manage weight. - Continue current diet and exercise regimen - Reschedule follow-up appointment for weight management in 4 weeks  Follow-up - Reschedule follow-up appointment, 4 weeks x 2         I have personally spent 40 minutes total time today in  preparation, patient care, and documentation for this visit, including the following: review of clinical lab tests; review of medical tests/procedures/services.    She was informed of the importance of frequent follow up visits to maximize her success with intensive lifestyle modifications for her multiple health conditions.    Quillian Quince, MD
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
## 2022-12-20 ENCOUNTER — Other Ambulatory Visit: Payer: Self-pay

## 2022-12-20 ENCOUNTER — Telehealth (INDEPENDENT_AMBULATORY_CARE_PROVIDER_SITE_OTHER): Payer: 59 | Admitting: Psychiatry

## 2022-12-20 ENCOUNTER — Encounter: Payer: Self-pay | Admitting: Psychiatry

## 2022-12-20 ENCOUNTER — Other Ambulatory Visit (HOSPITAL_COMMUNITY): Payer: Self-pay

## 2022-12-20 DIAGNOSIS — F330 Major depressive disorder, recurrent, mild: Secondary | ICD-10-CM

## 2022-12-20 DIAGNOSIS — F411 Generalized anxiety disorder: Secondary | ICD-10-CM | POA: Diagnosis not present

## 2022-12-20 MED ORDER — BUSPIRONE HCL 10 MG PO TABS
10.0000 mg | ORAL_TABLET | Freq: Three times a day (TID) | ORAL | 1 refills | Status: DC
  Filled 2022-12-20 (×2): qty 90, 30d supply, fill #0
  Filled 2023-01-25: qty 90, 30d supply, fill #1

## 2022-12-21 ENCOUNTER — Other Ambulatory Visit: Payer: Self-pay

## 2022-12-23 MED FILL — Lamotrigine Tab 100 MG: ORAL | 30 days supply | Qty: 60 | Fill #1 | Status: AC

## 2022-12-23 MED FILL — Buspirone HCl Tab 5 MG: ORAL | 20 days supply | Qty: 60 | Fill #1 | Status: AC

## 2022-12-24 ENCOUNTER — Other Ambulatory Visit (HOSPITAL_COMMUNITY): Payer: Self-pay

## 2023-01-03 ENCOUNTER — Other Ambulatory Visit (HOSPITAL_BASED_OUTPATIENT_CLINIC_OR_DEPARTMENT_OTHER): Payer: Self-pay

## 2023-01-03 ENCOUNTER — Other Ambulatory Visit: Payer: Self-pay | Admitting: Gastroenterology

## 2023-01-03 ENCOUNTER — Other Ambulatory Visit: Payer: Self-pay

## 2023-01-03 ENCOUNTER — Other Ambulatory Visit (HOSPITAL_COMMUNITY): Payer: Self-pay

## 2023-01-03 MED ORDER — PANTOPRAZOLE SODIUM 40 MG PO TBEC
40.0000 mg | DELAYED_RELEASE_TABLET | Freq: Two times a day (BID) | ORAL | 2 refills | Status: DC
  Filled 2023-01-03: qty 60, 30d supply, fill #0
  Filled 2023-02-07: qty 60, 30d supply, fill #1
  Filled 2023-03-06: qty 60, 30d supply, fill #2

## 2023-01-09 ENCOUNTER — Telehealth: Payer: Self-pay

## 2023-01-09 ENCOUNTER — Encounter (INDEPENDENT_AMBULATORY_CARE_PROVIDER_SITE_OTHER): Payer: Self-pay | Admitting: Family Medicine

## 2023-01-09 ENCOUNTER — Other Ambulatory Visit (HOSPITAL_COMMUNITY): Payer: Self-pay

## 2023-01-09 ENCOUNTER — Ambulatory Visit (INDEPENDENT_AMBULATORY_CARE_PROVIDER_SITE_OTHER): Payer: 59 | Admitting: Family Medicine

## 2023-01-09 NOTE — Telephone Encounter (Signed)
 PA submitted through Cover My Meds for Trulicity. Awaiting insurance determination. Key: [id number]

## 2023-01-10 ENCOUNTER — Other Ambulatory Visit (HOSPITAL_COMMUNITY): Payer: Self-pay

## 2023-01-10 ENCOUNTER — Ambulatory Visit: Payer: 59 | Admitting: Neurology

## 2023-01-10 NOTE — Telephone Encounter (Signed)
 Approved. TRULICITY 3MG /0.5ML Soln Auto-inj is approved from 01/09/2023 to 01/09/2024. All strengths of the drug are approved

## 2023-01-10 NOTE — Telephone Encounter (Signed)
 PA was approved from 01/09/2023-01/09/2024. Patient has been notified via Mychart.

## 2023-01-21 ENCOUNTER — Other Ambulatory Visit: Payer: Self-pay

## 2023-01-21 ENCOUNTER — Ambulatory Visit (INDEPENDENT_AMBULATORY_CARE_PROVIDER_SITE_OTHER): Payer: Medicaid Other | Admitting: Orthopedic Surgery

## 2023-01-21 ENCOUNTER — Encounter: Payer: Self-pay | Admitting: Orthopedic Surgery

## 2023-01-21 ENCOUNTER — Other Ambulatory Visit (INDEPENDENT_AMBULATORY_CARE_PROVIDER_SITE_OTHER): Payer: Self-pay

## 2023-01-21 DIAGNOSIS — M65311 Trigger thumb, right thumb: Secondary | ICD-10-CM | POA: Diagnosis not present

## 2023-01-21 DIAGNOSIS — M79641 Pain in right hand: Secondary | ICD-10-CM

## 2023-01-22 ENCOUNTER — Encounter: Payer: Self-pay | Admitting: Orthopedic Surgery
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
## 2023-01-23 MED ORDER — LIDOCAINE HCL 1 % IJ SOLN
3.0000 mL | INTRAMUSCULAR | Status: AC | PRN
  Administered 2023-01-21: 3 mL

## 2023-01-23 MED ORDER — BUPIVACAINE HCL 0.25 % IJ SOLN
0.3300 mL | INTRAMUSCULAR | Status: AC | PRN
  Administered 2023-01-21: .33 mL

## 2023-01-23 MED ORDER — METHYLPREDNISOLONE ACETATE 40 MG/ML IJ SUSP
13.3300 mg | INTRAMUSCULAR | Status: AC | PRN
  Administered 2023-01-21: 13.33 mg

## 2023-01-25 ENCOUNTER — Other Ambulatory Visit: Payer: Self-pay | Admitting: Internal Medicine

## 2023-01-25 ENCOUNTER — Other Ambulatory Visit: Payer: Self-pay

## 2023-01-25 ENCOUNTER — Other Ambulatory Visit (HOSPITAL_COMMUNITY): Payer: Self-pay

## 2023-01-25 DIAGNOSIS — R110 Nausea: Secondary | ICD-10-CM

## 2023-01-25 MED ORDER — ONDANSETRON 4 MG PO TBDP
4.0000 mg | ORAL_TABLET | Freq: Three times a day (TID) | ORAL | 0 refills | Status: DC | PRN
  Filled 2023-01-25: qty 20, 7d supply, fill #0

## 2023-01-25 MED FILL — Lamotrigine Tab 100 MG: ORAL | 23 days supply | Qty: 46 | Fill #2 | Status: AC

## 2023-01-28 ENCOUNTER — Ambulatory Visit
Admission: EM | Admit: 2023-01-28 | Discharge: 2023-01-28 | Disposition: A | Discharge status: Home / Self Care | Payer: Medicaid Other | Attending: Nurse Practitioner | Admitting: Nurse Practitioner

## 2023-01-28 DIAGNOSIS — B349 Viral infection, unspecified: Secondary | ICD-10-CM | POA: Diagnosis not present

## 2023-01-28 LAB — POC COVID19/FLU A&B COMBO
Covid Antigen, POC: NEGATIVE
Influenza A Antigen, POC: NEGATIVE
Influenza B Antigen, POC: NEGATIVE

## 2023-01-28 MED ORDER — ONDANSETRON 4 MG PO TBDP: 4.0000 mg | ORAL_TABLET | Freq: Three times a day (TID) | ORAL | 0 refills | Status: DC | PRN
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
## 2023-01-28 NOTE — Discharge Instructions (Signed)
You have a viral upper respiratory infection.  Symptoms should improve over the next week to 10 days.  If you develop chest pain or shortness of breath, go to the emergency room.  COVID-19 and influenza test is negative today.  Some things that can make you feel better are: - Increased rest - Increasing fluid with water/sugar free electrolytes - Acetaminophen as needed for fever/pain - Salt water gargling, chloraseptic spray and throat lozenges for sore throat - OTC guaifenesin (Mucinex) 600 mg twice daily for congestion - Saline sinus flushes or a neti pot - Humidifying the air - Zofran every 8 hours as needed for nausea/vomiting

## 2023-01-28 NOTE — ED Triage Notes (Addendum)
Pt reports, fatigue, chills,nasal congestion  body aches, diarrhea headache nausea x 1 day.

## 2023-02-04 ENCOUNTER — Other Ambulatory Visit (HOSPITAL_COMMUNITY): Payer: Self-pay

## 2023-02-08 ENCOUNTER — Other Ambulatory Visit (HOSPITAL_COMMUNITY): Payer: Self-pay

## 2023-02-08 ENCOUNTER — Encounter (HOSPITAL_COMMUNITY): Payer: Self-pay | Admitting: *Deleted

## 2023-02-08 ENCOUNTER — Ambulatory Visit
Admission: EM | Admit: 2023-02-08 | Discharge: 2023-02-08 | Disposition: A | Discharge status: Home / Self Care | Payer: Medicaid Other | Attending: Family Medicine | Admitting: Family Medicine

## 2023-02-08 DIAGNOSIS — U071 COVID-19: Secondary | ICD-10-CM

## 2023-02-08 LAB — POC COVID19/FLU A&B COMBO
Covid Antigen, POC: POSITIVE — AB
Influenza A Antigen, POC: NEGATIVE
Influenza B Antigen, POC: NEGATIVE

## 2023-02-08 MED ORDER — PROMETHAZINE-DM 6.25-15 MG/5ML PO SYRP: 5.0000 mL | ORAL_SOLUTION | Freq: Four times a day (QID) | ORAL | 0 refills | Status: DC | PRN

## 2023-02-08 MED ORDER — FLUTICASONE PROPIONATE 50 MCG/ACT NA SUSP: 1.0000 | Freq: Two times a day (BID) | NASAL | 2 refills | Status: DC

## 2023-02-08 MED ORDER — PAXLOVID (300/100) 20 X 150 MG & 10 X 100MG PO TBPK: 3.0000 | ORAL_TABLET | Freq: Two times a day (BID) | ORAL | 0 refills | Status: AC
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
## 2023-02-08 NOTE — ED Triage Notes (Signed)
 Pt states headache,cough,congestion and body aches since yesterday.

## 2023-02-12 ENCOUNTER — Other Ambulatory Visit: Payer: Self-pay | Admitting: Neurology

## 2023-02-13 ENCOUNTER — Other Ambulatory Visit: Payer: Self-pay

## 2023-02-13 MED ORDER — LAMOTRIGINE 100 MG PO TABS
200.0000 mg | ORAL_TABLET | Freq: Every day | ORAL | 0 refills | Status: DC
  Filled 2023-02-13: qty 180, 90d supply, fill #0

## 2023-02-13 NOTE — Telephone Encounter (Signed)
Follow up 02/18/23

## 2023-02-14 ENCOUNTER — Encounter: Payer: Self-pay | Admitting: Neurology

## 2023-02-14 ENCOUNTER — Telehealth (INDEPENDENT_AMBULATORY_CARE_PROVIDER_SITE_OTHER): Payer: Medicaid Other | Admitting: Family Medicine

## 2023-02-14 ENCOUNTER — Encounter (INDEPENDENT_AMBULATORY_CARE_PROVIDER_SITE_OTHER): Payer: Self-pay | Admitting: Family Medicine

## 2023-02-14 ENCOUNTER — Other Ambulatory Visit (HOSPITAL_COMMUNITY): Payer: Self-pay

## 2023-02-14 VITALS — Ht 62.0 in | Wt 139.0 lb

## 2023-02-14 DIAGNOSIS — E669 Obesity, unspecified: Secondary | ICD-10-CM

## 2023-02-14 DIAGNOSIS — G9332 Myalgic encephalomyelitis/chronic fatigue syndrome: Secondary | ICD-10-CM

## 2023-02-14 DIAGNOSIS — U099 Post covid-19 condition, unspecified: Secondary | ICD-10-CM | POA: Diagnosis not present

## 2023-02-14 DIAGNOSIS — Z794 Long term (current) use of insulin: Secondary | ICD-10-CM

## 2023-02-14 DIAGNOSIS — Z9884 Bariatric surgery status: Secondary | ICD-10-CM

## 2023-02-14 DIAGNOSIS — E1169 Type 2 diabetes mellitus with other specified complication: Secondary | ICD-10-CM

## 2023-02-14 DIAGNOSIS — E119 Type 2 diabetes mellitus without complications: Secondary | ICD-10-CM | POA: Diagnosis not present

## 2023-02-14 DIAGNOSIS — Z6825 Body mass index (BMI) 25.0-25.9, adult: Secondary | ICD-10-CM | POA: Diagnosis not present

## 2023-02-14 DIAGNOSIS — Z7985 Long-term (current) use of injectable non-insulin antidiabetic drugs: Secondary | ICD-10-CM | POA: Diagnosis not present

## 2023-02-14 MED ORDER — TRULICITY 3 MG/0.5ML ~~LOC~~ SOAJ
3.0000 mg | SUBCUTANEOUS | 0 refills | Status: DC
  Filled 2023-02-14: qty 6, 84d supply, fill #0
  Filled 2023-03-06: qty 2, 28d supply, fill #0
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
## 2023-02-14 NOTE — Progress Notes (Signed)
Office: [phone]  /  Fax: [phone]  WEIGHT SUMMARY AND BIOMETRICS  Anthropometric Measurements Height: 5\' 2"  (1.575 m) Weight: 139 lb (63 kg) (139 at last OV 12/18/2022. Per patient she weigh 130 lb) BMI (Calculated): 25.42 Weight at Last Visit: 139 lb (Last in office visit 12-18-2023) Starting Weight: 217 lb Total Weight Loss (lbs): 75 lb (34 kg)   No data recorded Other Clinical Data Fasting: No Labs: No Today's Visit #: 32 Starting Date: 09/14/19 Comments: Video Visit    Chief Complaint: OBESITY Virtual Visit via Video Note  I connected with Brooke [name] on 02/14/23 at  7:40 AM EST by a video enabled telemedicine application and verified that I am speaking with the correct person using two identifiers.  Location: Patient: work Provider: office   I discussed the limitations of evaluation and management by telemedicine and the availability of in person appointments. The patient expressed understanding and agreed to proceed.    History of Present Illness   Brooke [name] is a 53 year old female with obesity and type 2 diabetes who presents to discuss her treatment plan.  She is recovering from COVID-19, which led her to change her visit from in-person to telehealth. She experiences fatigue and a lack of energy, attributing these symptoms to her recent illness. She has lost five pounds due to a decreased appetite during her recovery.  Her type 2 diabetes is managed with Trulicity, and her last A1c was 5.1, indicating good control. She has not reported any issues with her current diabetes management regimen.  She describes intermittent pain at the top of her stomach, suspecting it might be related to a hernia or ulcer. The pain is exacerbated by consuming foods she should avoid, particularly those high in volume or carbohydrates. She also avoids peanuts and seeds due to her history of gastric bypass surgery.  She inquires about taking vitamin C in addition  to her multivitamin.          PHYSICAL EXAM:  Height 5\' 2"  (1.575 m), weight 139 lb (63 kg), last menstrual period 09/01/2020. Body mass index is 25.42 kg/m.  DIAGNOSTIC DATA REVIEWED:  BMET    Component Value Date/Time   NA 141 10/11/2022 0801   K 4.7 10/11/2022 0801   CL 103 10/11/2022 0801   CO2 25 10/11/2022 0801   GLUCOSE 83 10/11/2022 0801   GLUCOSE 83 08/08/2022 1230   BUN 10 10/11/2022 0801   CREATININE 0.81 10/11/2022 0801   CREATININE 1.00 08/05/2019 0904   CALCIUM 8.8 10/11/2022 0801   GFRNONAA >60 08/08/2022 1230   GFRNONAA 66 08/05/2019 0904   GFRAA 94 02/22/2020 0839   GFRAA 77 08/05/2019 0904   Lab Results  Component Value Date   HGBA1C 5.1 10/11/2022   HGBA1C 5.3 08/05/2019   Lab Results  Component Value Date   INSULIN 5.2 10/11/2022   INSULIN 5.9 09/14/2019   Lab Results  Component Value Date   TSH 2.170 01/16/2022   CBC    Component Value Date/Time   WBC 3.6 (L) 08/08/2022 1230   RBC 4.13 08/08/2022 1230   HGB 12.7 08/08/2022 1230   HGB 13.3 10/22/2019 1054   HCT 39.2 08/08/2022 1230   HCT 40.0 10/22/2019 1054   PLT 304 08/08/2022 1230   PLT 349 10/22/2019 1054   MCV 94.9 08/08/2022 1230   MCV 92 10/22/2019 1054   MCH 30.8 08/08/2022 1230   MCHC 32.4 08/08/2022 1230   RDW 14.2 08/08/2022 1230  RDW 13.0 10/22/2019 1054   Iron Studies    Component Value Date/Time   IRON 111 09/03/2019 0809   TIBC 292 09/03/2019 0809   FERRITIN 187 09/03/2019 0809   IRONPCTSAT 38 09/03/2019 0809   Lipid Panel     Component Value Date/Time   CHOL 179 10/11/2022 0801   TRIG 36 10/11/2022 0801   HDL 72 10/11/2022 0801   CHOLHDL 3.0 03/24/2020 1235   CHOLHDL 3.4 08/05/2019 0904   VLDL 13 05/13/2012 1737   LDLCALC 99 10/11/2022 0801   LDLCALC 127 (H) 08/05/2019 0904   Hepatic Function Panel     Component Value Date/Time   PROT 5.9 (L) 10/11/2022 0801   ALBUMIN 4.0 10/11/2022 0801   AST 30 10/11/2022 0801   ALT 35 (H) 10/11/2022 0801    ALKPHOS 71 10/11/2022 0801   BILITOT 0.3 10/11/2022 0801   BILIDIR 0.1 07/16/2017 0640   IBILI 0.6 07/16/2017 0640      Component Value Date/Time   TSH 2.170 01/16/2022 1004   Nutritional Lab Results  Component Value Date   VD25OH 59.0 10/11/2022   VD25OH 41.5 01/16/2022   VD25OH 48.5 01/11/2021     Assessment and Plan    Obesity Discussed obesity management post-gastric bypass surgery. Recently lost five pounds due to decreased appetite from COVID-19. Advised to maintain hydration and avoid peanuts and seeds post-surgery. - Encourage hydration - Avoid peanuts and seeds - Monitor weight and dietary intake  Type 2 Diabetes Mellitus Type 2 diabetes, currently on Trulicity. Last A1c in October was 5.1, indicating good glycemic control. Labs to be checked next month, visit should be fasting. - Send prescription for Trulicity to [facility] pharmacy - Schedule fasting lab tests for next month  Post-COVID-19 Recovery Recovering from COVID-19, reports fatigue and lack of energy. Advised that recovery can take a few weeks and to stay hydrated. - Encourage hydration - Monitor energy levels and recovery progress  General Health Maintenance Inquired about vitamin C intake. Advised to take vitamin C an hour apart from other medications to avoid absorption issues. No upper limit as excess is excreted in urine. - Take vitamin C as recommended on the bottle, ensuring it is taken an hour apart from other medications  Follow-up - Call front office to schedule next visit as a fasting appointment in 4-6 weeks.     She was informed of the importance of frequent follow up visits to maximize her success with intensive lifestyle modifications for her multiple health conditions.    Quillian Quince, MD

## 2023-02-18 ENCOUNTER — Encounter: Payer: Self-pay | Admitting: Neurology

## 2023-02-18 ENCOUNTER — Ambulatory Visit: Payer: 59 | Admitting: Neurology

## 2023-02-19 ENCOUNTER — Other Ambulatory Visit (HOSPITAL_COMMUNITY): Payer: Self-pay

## 2023-02-20 ENCOUNTER — Other Ambulatory Visit: Payer: Self-pay

## 2023-02-20 ENCOUNTER — Other Ambulatory Visit (HOSPITAL_COMMUNITY): Payer: Self-pay

## 2023-02-20 ENCOUNTER — Other Ambulatory Visit: Payer: Self-pay | Admitting: Psychiatry

## 2023-02-20 ENCOUNTER — Encounter: Payer: Self-pay | Admitting: Psychiatry

## 2023-02-20 ENCOUNTER — Telehealth: Payer: Medicaid Other | Admitting: Psychiatry

## 2023-02-20 DIAGNOSIS — F4310 Post-traumatic stress disorder, unspecified: Secondary | ICD-10-CM | POA: Diagnosis not present

## 2023-02-20 DIAGNOSIS — F411 Generalized anxiety disorder: Secondary | ICD-10-CM

## 2023-02-20 DIAGNOSIS — F3341 Major depressive disorder, recurrent, in partial remission: Secondary | ICD-10-CM | POA: Diagnosis not present

## 2023-02-20 MED ORDER — PRAZOSIN HCL 1 MG PO CAPS
1.0000 mg | ORAL_CAPSULE | Freq: Every day | ORAL | 0 refills | Status: DC
  Filled 2023-02-20: qty 3, 3d supply, fill #0

## 2023-02-20 MED ORDER — BUSPIRONE HCL 15 MG PO TABS
15.0000 mg | ORAL_TABLET | Freq: Three times a day (TID) | ORAL | 1 refills | Status: DC
  Filled 2023-02-20: qty 90, 30d supply, fill #0
  Filled 2023-03-21: qty 90, 30d supply, fill #1

## 2023-02-20 MED ORDER — PRAZOSIN HCL 2 MG PO CAPS
2.0000 mg | ORAL_CAPSULE | Freq: Every day | ORAL | 0 refills | Status: DC
  Filled 2023-02-20: qty 30, 30d supply, fill #0

## 2023-02-20 NOTE — Patient Instructions (Signed)
Continue lamotrigine 200 mg daily   Increase buspar 15 mg three times a day  Start prazosin 1 mg at night for 3 days, then 2 mg at night  Next appointment: 4/16  at 11:30 am, video Referral to therapy
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
## 2023-03-04 ENCOUNTER — Ambulatory Visit: Payer: Medicaid Other | Attending: Internal Medicine | Admitting: Internal Medicine

## 2023-03-04 ENCOUNTER — Ambulatory Visit: Payer: Medicaid Other | Admitting: Orthopedic Surgery

## 2023-03-04 ENCOUNTER — Encounter: Payer: Self-pay | Admitting: Internal Medicine

## 2023-03-04 VITALS — BP 119/77 | HR 65 | Resp 14 | Ht 63.25 in | Wt 143.0 lb

## 2023-03-04 DIAGNOSIS — M19042 Primary osteoarthritis, left hand: Secondary | ICD-10-CM | POA: Diagnosis not present

## 2023-03-04 DIAGNOSIS — M7918 Myalgia, other site: Secondary | ICD-10-CM

## 2023-03-04 DIAGNOSIS — M7989 Other specified soft tissue disorders: Secondary | ICD-10-CM

## 2023-03-04 DIAGNOSIS — M19041 Primary osteoarthritis, right hand: Secondary | ICD-10-CM

## 2023-03-04 NOTE — Patient Instructions (Signed)
For osteoarthritis several treatments may be beneficial:  - Topical antiinflammatory medicine such as diclofenac or Voltaren can be applied to  affected area as needed. Topical analgesics containing CBD, menthol, or lidocaine can be tried.  - Turmeric has some antiinflammatory effect similar to NSAIDs and may help, if taken as a supplement should not be taken above recommended doses.   - Compressive gloves or sleeve can be helpful to support the joint especially if hurting or swelling with certain activities.  - Physical therapy referral can discuss exercises or activity modification to improve symptoms or strength if needed.  - Local steroid injection is an option if symptoms become worse and not controlled by the above options.

## 2023-03-06 ENCOUNTER — Other Ambulatory Visit (HOSPITAL_COMMUNITY): Payer: Self-pay

## 2023-03-06 ENCOUNTER — Other Ambulatory Visit: Payer: Self-pay

## 2023-03-06 LAB — SEDIMENTATION RATE: Sed Rate: 6 mm/h (ref 0–30)

## 2023-03-06 LAB — CYCLIC CITRUL PEPTIDE ANTIBODY, IGG: Cyclic Citrullin Peptide Ab: <16 U

## 2023-03-06 LAB — C-REACTIVE PROTEIN: CRP: <3 mg/L (ref <8.0)

## 2023-03-13 ENCOUNTER — Other Ambulatory Visit (HOSPITAL_COMMUNITY): Payer: Self-pay

## 2023-03-14 ENCOUNTER — Other Ambulatory Visit (HOSPITAL_COMMUNITY): Payer: Self-pay

## 2023-03-14 ENCOUNTER — Other Ambulatory Visit: Payer: Self-pay

## 2023-03-14 ENCOUNTER — Ambulatory Visit (INDEPENDENT_AMBULATORY_CARE_PROVIDER_SITE_OTHER): Payer: 59 | Admitting: Family Medicine

## 2023-03-14 ENCOUNTER — Encounter (INDEPENDENT_AMBULATORY_CARE_PROVIDER_SITE_OTHER): Payer: Self-pay | Admitting: Family Medicine

## 2023-03-14 VITALS — BP 126/77 | HR 68 | Temp 98.0°F | Ht 62.0 in | Wt 138.0 lb

## 2023-03-14 DIAGNOSIS — E669 Obesity, unspecified: Secondary | ICD-10-CM

## 2023-03-14 DIAGNOSIS — E559 Vitamin D deficiency, unspecified: Secondary | ICD-10-CM

## 2023-03-14 DIAGNOSIS — Z6825 Body mass index (BMI) 25.0-25.9, adult: Secondary | ICD-10-CM

## 2023-03-14 DIAGNOSIS — E119 Type 2 diabetes mellitus without complications: Secondary | ICD-10-CM | POA: Diagnosis not present

## 2023-03-14 DIAGNOSIS — E1169 Type 2 diabetes mellitus with other specified complication: Secondary | ICD-10-CM

## 2023-03-14 DIAGNOSIS — Z794 Long term (current) use of insulin: Secondary | ICD-10-CM

## 2023-03-14 MED ORDER — TRULICITY 3 MG/0.5ML ~~LOC~~ SOAJ
3.0000 mg | SUBCUTANEOUS | 0 refills | Status: DC
  Filled 2023-03-14: qty 6, 84d supply, fill #0
  Filled 2023-04-03: qty 2, 28d supply, fill #0
  Filled 2023-04-26: qty 2, 28d supply, fill #1

## 2023-03-14 NOTE — Progress Notes (Signed)
 Office: [phone]  /  Fax: [phone]  WEIGHT SUMMARY AND BIOMETRICS  Anthropometric Measurements Height: 5\' 2"  (1.575 m) Weight: 138 lb (62.6 kg) BMI (Calculated): 25.23 Weight at Last Visit: 139 lb Weight Lost Since Last Visit: 1 lb Weight Gained Since Last Visit: 0 lb Starting Weight: 217 lb Total Weight Loss (lbs): 76 lb (34.5 kg)   Body Composition  Body Fat %: 34.6 % Fat Mass (lbs): 47.8 lbs Muscle Mass (lbs): 86 lbs Total Body Water (lbs): 70.8 lbs Visceral Fat Rating : 7   Other Clinical Data Labs: No Today's Visit #: 42 Starting Date: 09/14/19    Chief Complaint: OBESITY  History of Present Illness   The patient, with obesity and type 2 diabetes, presents to discuss her obesity treatment plan and evaluate her progress.  She follows her pescetarian diet plan about 30% of the time and engages in walking for exercise, approximately 60 minutes once per week. Since her last office visit on December 24, she has lost one pound over the past three months.  She has a history of type 2 diabetes and is currently on Trulicity. Her most recent hemoglobin A1c, done in October 2024, was well controlled at 5.1. She does not check her blood sugars at home and has not experienced any hypoglycemia. Occasionally, she feels unwell when she goes too long without eating.  Regarding her vitamin D status, her last level was 71, and she takes a bariatric multivitamin.  She mentions her mother is currently hospitalized at [facility] due to a stroke, with some return of feeling on the left side and the ability to talk a little. Her mother also had the flu last week and is eating minimally. There is concern about her mother's potential depression. Plans for rehabilitation post-discharge are discussed, but her mother has not expressed willingness to participate. She also notes her father had a stroke in October but is doing well now. She expresses stress related to her parents' health  issues.          PHYSICAL EXAM:  Blood pressure 126/77, pulse 68, temperature 98 F (36.7 C), height 5\' 2"  (1.575 m), weight 138 lb (62.6 kg), last menstrual period 09/01/2020, SpO2 99%. Body mass index is 25.24 kg/m.  DIAGNOSTIC DATA REVIEWED:  BMET    Component Value Date/Time   NA 141 10/11/2022 0801   K 4.7 10/11/2022 0801   CL 103 10/11/2022 0801   CO2 25 10/11/2022 0801   GLUCOSE 83 10/11/2022 0801   GLUCOSE 83 08/08/2022 1230   BUN 10 10/11/2022 0801   CREATININE 0.81 10/11/2022 0801   CREATININE 1.00 08/05/2019 0904   CALCIUM 8.8 10/11/2022 0801   GFRNONAA >60 08/08/2022 1230   GFRNONAA 66 08/05/2019 0904   GFRAA 94 02/22/2020 0839   GFRAA 77 08/05/2019 0904   Lab Results  Component Value Date   HGBA1C 5.1 10/11/2022   HGBA1C 5.3 08/05/2019   Lab Results  Component Value Date   INSULIN 5.2 10/11/2022   INSULIN 5.9 09/14/2019   Lab Results  Component Value Date   TSH 2.170 01/16/2022   CBC    Component Value Date/Time   WBC 3.6 (L) 08/08/2022 1230   RBC 4.13 08/08/2022 1230   HGB 12.7 08/08/2022 1230   HGB 13.3 10/22/2019 1054   HCT 39.2 08/08/2022 1230   HCT 40.0 10/22/2019 1054   PLT 304 08/08/2022 1230   PLT 349 10/22/2019 1054   MCV 94.9 08/08/2022 1230   MCV 92 10/22/2019 1054  MCH 30.8 08/08/2022 1230   MCHC 32.4 08/08/2022 1230   RDW 14.2 08/08/2022 1230   RDW 13.0 10/22/2019 1054   Iron Studies    Component Value Date/Time   IRON 111 09/03/2019 0809   TIBC 292 09/03/2019 0809   FERRITIN 187 09/03/2019 0809   IRONPCTSAT 38 09/03/2019 0809   Lipid Panel     Component Value Date/Time   CHOL 179 10/11/2022 0801   TRIG 36 10/11/2022 0801   HDL 72 10/11/2022 0801   CHOLHDL 3.0 03/24/2020 1235   CHOLHDL 3.4 08/05/2019 0904   VLDL 13 05/13/2012 1737   LDLCALC 99 10/11/2022 0801   LDLCALC 127 (H) 08/05/2019 0904   Hepatic Function Panel     Component Value Date/Time   PROT 5.9 (L) 10/11/2022 0801   ALBUMIN 4.0 10/11/2022  0801   AST 30 10/11/2022 0801   ALT 35 (H) 10/11/2022 0801   ALKPHOS 71 10/11/2022 0801   BILITOT 0.3 10/11/2022 0801   BILIDIR 0.1 07/16/2017 0640   IBILI 0.6 07/16/2017 0640      Component Value Date/Time   TSH 2.170 01/16/2022 1004   Nutritional Lab Results  Component Value Date   VD25OH 59.0 10/11/2022   VD25OH 41.5 01/16/2022   VD25OH 48.5 01/11/2021     Assessment and Plan    Type 2 Diabetes Mellitus Type 2 diabetes is well-controlled with Trulicity. Hemoglobin A1c was 5.1 in October 2024. No hypoglycemia reported. Emphasized regular meals to prevent symptoms mimicking hypoglycemia, as Trulicity should not cause hypoglycemia unless meals are skipped. - Continue Trulicity - Fasting labs in one month to monitor blood glucose, renal and hepatic function, and vitamin D levels  Obesity Obesity in remission. She follows a pescetarian diet 30% of the time and exercises by walking 60 minutes weekly. She lost one pound in the last three months. Discussed meal planning and using Chat[employer]PT for recipes to meet nutritional goals of 1000 calories and 70 grams of protein daily. Encouraged increased physical activity, especially strength training, to maintain metabolism. Addressed time management challenges due to family responsibilities and strategies like grocery pickup and delivery. - Continue current diet and exercise regimen - Utilize ChatGPT for meal planning and recipes - Increase physical activity, focusing on strength training - Consider grocery pickup or delivery to save time  Vitamin D Deficiency Vitamin D levels were well-managed with a bariatric multivitamin, last level at 59. She continues the multivitamin. - Continue bariatric multivitamin - Check vitamin D levels in one month       She was informed of the importance of frequent follow up visits to maximize her success with intensive lifestyle modifications for her multiple health conditions.    Quillian Quince, MD

## 2023-03-15 ENCOUNTER — Other Ambulatory Visit: Payer: Self-pay | Admitting: Psychiatry

## 2023-03-15 ENCOUNTER — Other Ambulatory Visit (HOSPITAL_COMMUNITY): Payer: Self-pay

## 2023-03-15 MED ORDER — PRAZOSIN HCL 2 MG PO CAPS
2.0000 mg | ORAL_CAPSULE | Freq: Every day | ORAL | 0 refills | Status: DC
  Filled 2023-03-15: qty 30, 30d supply, fill #0

## 2023-03-21 ENCOUNTER — Other Ambulatory Visit: Payer: Self-pay | Admitting: Neurology

## 2023-03-21 ENCOUNTER — Other Ambulatory Visit: Payer: Self-pay

## 2023-03-21 ENCOUNTER — Other Ambulatory Visit (HOSPITAL_COMMUNITY): Payer: Self-pay

## 2023-03-21 MED ORDER — LEVETIRACETAM 750 MG PO TABS
750.0000 mg | ORAL_TABLET | Freq: Two times a day (BID) | ORAL | 0 refills | Status: DC
  Filled 2023-03-21: qty 60, 30d supply, fill #0

## 2023-03-21 NOTE — Telephone Encounter (Signed)
 Last seen on 10/08/22 per note "Follow-up in 3 to 4 months with Dr. Vickey Huger " No follow up scheduled

## 2023-04-02 ENCOUNTER — Other Ambulatory Visit: Payer: Self-pay | Admitting: Gastroenterology

## 2023-04-03 ENCOUNTER — Other Ambulatory Visit (HOSPITAL_COMMUNITY): Payer: Self-pay

## 2023-04-03 ENCOUNTER — Other Ambulatory Visit: Payer: Self-pay

## 2023-04-08 ENCOUNTER — Other Ambulatory Visit (HOSPITAL_COMMUNITY): Payer: Self-pay

## 2023-04-08 MED ORDER — PANTOPRAZOLE SODIUM 40 MG PO TBEC
40.0000 mg | DELAYED_RELEASE_TABLET | Freq: Two times a day (BID) | ORAL | 2 refills | Status: DC
  Filled 2023-04-08: qty 60, 30d supply, fill #0
  Filled 2023-05-04: qty 60, 30d supply, fill #1
  Filled 2023-06-03: qty 60, 30d supply, fill #2
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
## 2023-04-15 ENCOUNTER — Other Ambulatory Visit: Payer: Self-pay | Admitting: Psychiatry

## 2023-04-15 ENCOUNTER — Other Ambulatory Visit: Payer: Self-pay

## 2023-04-15 ENCOUNTER — Other Ambulatory Visit (HOSPITAL_COMMUNITY): Payer: Self-pay

## 2023-04-15 MED ORDER — PRAZOSIN HCL 2 MG PO CAPS
2.0000 mg | ORAL_CAPSULE | Freq: Every day | ORAL | 0 refills | Status: DC
  Filled 2023-04-15: qty 30, 30d supply, fill #0

## 2023-04-17 ENCOUNTER — Other Ambulatory Visit: Payer: Self-pay

## 2023-04-17 ENCOUNTER — Telehealth: Payer: Medicaid Other | Admitting: Psychiatry

## 2023-04-17 ENCOUNTER — Encounter: Payer: Self-pay | Admitting: Psychiatry

## 2023-04-17 ENCOUNTER — Other Ambulatory Visit (HOSPITAL_COMMUNITY): Payer: Self-pay

## 2023-04-17 DIAGNOSIS — F3341 Major depressive disorder, recurrent, in partial remission: Secondary | ICD-10-CM | POA: Diagnosis not present

## 2023-04-17 DIAGNOSIS — F411 Generalized anxiety disorder: Secondary | ICD-10-CM | POA: Diagnosis not present

## 2023-04-17 MED ORDER — PRAZOSIN HCL 2 MG PO CAPS
2.0000 mg | ORAL_CAPSULE | Freq: Every day | ORAL | 0 refills | Status: DC
  Filled 2023-05-21: qty 90, 90d supply, fill #0

## 2023-04-17 MED ORDER — BUSPIRONE HCL 15 MG PO TABS
15.0000 mg | ORAL_TABLET | Freq: Three times a day (TID) | ORAL | 0 refills | Status: AC
  Filled 2023-04-17: qty 90, 30d supply, fill #0
  Filled 2023-05-21: qty 90, 30d supply, fill #1
  Filled 2023-06-24: qty 90, 30d supply, fill #2

## 2023-04-17 MED ORDER — LAMOTRIGINE 100 MG PO TABS
200.0000 mg | ORAL_TABLET | Freq: Every day | ORAL | 0 refills | Status: DC
Start: 2023-05-14 — End: 2023-08-01
  Filled 2023-05-15: qty 180, 90d supply, fill #0

## 2023-04-17 NOTE — Patient Instructions (Addendum)
 Continue lamotrigine 200 mg daily   Continue buspar 15 mg three times a day  Continue prazosin 2 mg at night  Next appointment: 5/28 at 3 30 pm, video Please consider contacting one of the clinics below for therapy:  [facility]- [phone]  [facility]- [phone] [employer] [phone] [employer]- [phone]

## 2023-04-18 ENCOUNTER — Other Ambulatory Visit (HOSPITAL_COMMUNITY): Payer: Self-pay

## 2023-04-25 ENCOUNTER — Other Ambulatory Visit: Payer: Self-pay | Admitting: Neurology

## 2023-04-25 MED ORDER — LEVETIRACETAM 750 MG PO TABS
750.0000 mg | ORAL_TABLET | Freq: Two times a day (BID) | ORAL | 3 refills | Status: DC
  Filled 2023-04-25: qty 60, 30d supply, fill #0
  Filled 2023-05-21: qty 60, 30d supply, fill #1
  Filled 2023-06-24: qty 60, 30d supply, fill #2
  Filled 2023-07-21: qty 60, 30d supply, fill #3

## 2023-04-26 ENCOUNTER — Other Ambulatory Visit (HOSPITAL_COMMUNITY): Payer: Self-pay

## 2023-04-29 ENCOUNTER — Encounter (INDEPENDENT_AMBULATORY_CARE_PROVIDER_SITE_OTHER): Payer: Self-pay | Admitting: Family Medicine

## 2023-04-29 ENCOUNTER — Ambulatory Visit (INDEPENDENT_AMBULATORY_CARE_PROVIDER_SITE_OTHER): Admitting: Family Medicine

## 2023-04-29 ENCOUNTER — Other Ambulatory Visit: Payer: Self-pay

## 2023-04-29 ENCOUNTER — Other Ambulatory Visit (HOSPITAL_COMMUNITY): Payer: Self-pay

## 2023-04-29 VITALS — BP 111/72 | HR 60 | Temp 97.9°F | Ht 62.0 in | Wt 137.0 lb

## 2023-04-29 DIAGNOSIS — E669 Obesity, unspecified: Secondary | ICD-10-CM | POA: Diagnosis not present

## 2023-04-29 DIAGNOSIS — E785 Hyperlipidemia, unspecified: Secondary | ICD-10-CM | POA: Diagnosis not present

## 2023-04-29 DIAGNOSIS — E782 Mixed hyperlipidemia: Secondary | ICD-10-CM | POA: Diagnosis not present

## 2023-04-29 DIAGNOSIS — Z6825 Body mass index (BMI) 25.0-25.9, adult: Secondary | ICD-10-CM | POA: Diagnosis not present

## 2023-04-29 DIAGNOSIS — Z9884 Bariatric surgery status: Secondary | ICD-10-CM | POA: Diagnosis not present

## 2023-04-29 DIAGNOSIS — E559 Vitamin D deficiency, unspecified: Secondary | ICD-10-CM | POA: Diagnosis not present

## 2023-04-29 DIAGNOSIS — Z794 Long term (current) use of insulin: Secondary | ICD-10-CM

## 2023-04-29 DIAGNOSIS — E119 Type 2 diabetes mellitus without complications: Secondary | ICD-10-CM | POA: Diagnosis not present

## 2023-04-29 DIAGNOSIS — E1169 Type 2 diabetes mellitus with other specified complication: Secondary | ICD-10-CM

## 2023-04-29 MED ORDER — TRULICITY 3 MG/0.5ML ~~LOC~~ SOAJ
3.0000 mg | SUBCUTANEOUS | 0 refills | Status: DC
Start: 2023-04-29 — End: 2023-07-01
  Filled 2023-05-04: qty 2, 28d supply, fill #0
  Filled 2023-06-03: qty 2, 28d supply, fill #1
  Filled 2023-06-26: qty 2, 28d supply, fill #2

## 2023-04-29 NOTE — Progress Notes (Signed)
 Office: [phone]  /  Fax: [phone]  WEIGHT SUMMARY AND BIOMETRICS  Anthropometric Measurements Height: 5\' 2"  (1.575 m) Weight: 137 lb (62.1 kg) BMI (Calculated): 25.05 Weight at Last Visit: 138lb Weight Lost Since Last Visit: 1lb Weight Gained Since Last Visit: 0 Starting Weight: 217lb Total Weight Loss (lbs): 77 lb (34.9 kg)   Body Composition  Body Fat %: 33.3 % Fat Mass (lbs): 45.6 lbs Muscle Mass (lbs): 86.8 lbs Total Body Water  (lbs): 70 lbs Visceral Fat Rating : 7   Other Clinical Data Fasting: Yes Labs: Yes Today's Visit #: 34 Starting Date: 09/14/19    Chief Complaint: OBESITY    History of Present Illness Brooke [name] is a 53 year old female with obesity and type 2 diabetes who presents for a follow-up on her weight loss treatment.  She is following a pescetarian diet about 50% of the time and engages in walking for exercise for 30 minutes once a week. She has lost one pound over the past six weeks. Stress sometimes leads her to snack for comfort, although she tries to be mindful of her eating habits. She is planning a beach vacation with her children in the summer and a trip to [place]  in September, which she anticipates will involve different eating routines.  She is currently on Trulicity  for her type 2 diabetes and reports no issues with the medication. She is post-gastric bypass surgery, which necessitates monitoring for potential vitamin deficiencies.  Her mother suffered a stroke in February, which has been a source of stress. Her mother is currently in rehabilitation, and there are concerns about her comprehension and recovery, particularly regarding movement on the left side.      PHYSICAL EXAM:  Blood pressure 111/72, pulse 60, temperature 97.9 F (36.6 C), height 5\' 2"  (1.575 m), weight 137 lb (62.1 kg), last menstrual period 09/01/2020, SpO2 99%. Body mass index is 25.06 kg/m.  DIAGNOSTIC DATA REVIEWED:  BMET    Component  Value Date/Time   NA 141 10/11/2022 0801   K 4.7 10/11/2022 0801   CL 103 10/11/2022 0801   CO2 25 10/11/2022 0801   GLUCOSE 83 10/11/2022 0801   GLUCOSE 83 08/08/2022 1230   BUN 10 10/11/2022 0801   CREATININE 0.81 10/11/2022 0801   CREATININE 1.00 08/05/2019 0904   CALCIUM 8.8 10/11/2022 0801   GFRNONAA >60 08/08/2022 1230   GFRNONAA 66 08/05/2019 0904   GFRAA 94 02/22/2020 0839   GFRAA 77 08/05/2019 0904   Lab Results  Component Value Date   HGBA1C 5.1 10/11/2022   HGBA1C 5.3 08/05/2019   Lab Results  Component Value Date   INSULIN  5.2 10/11/2022   INSULIN  5.9 09/14/2019   Lab Results  Component Value Date   TSH 2.170 01/16/2022   CBC    Component Value Date/Time   WBC 3.6 (L) 08/08/2022 1230   RBC 4.13 08/08/2022 1230   HGB 12.7 08/08/2022 1230   HGB 13.3 10/22/2019 1054   HCT 39.2 08/08/2022 1230   HCT 40.0 10/22/2019 1054   PLT 304 08/08/2022 1230   PLT 349 10/22/2019 1054   MCV 94.9 08/08/2022 1230   MCV 92 10/22/2019 1054   MCH 30.8 08/08/2022 1230   MCHC 32.4 08/08/2022 1230   RDW 14.2 08/08/2022 1230   RDW 13.0 10/22/2019 1054   Iron Studies    Component Value Date/Time   IRON 111 09/03/2019 0809   TIBC 292 09/03/2019 0809   FERRITIN 187 09/03/2019 0809   IRONPCTSAT 38  09/03/2019 0809   Lipid Panel     Component Value Date/Time   CHOL 179 10/11/2022 0801   TRIG 36 10/11/2022 0801   HDL 72 10/11/2022 0801   CHOLHDL 3.0 03/24/2020 1235   CHOLHDL 3.4 08/05/2019 0904   VLDL 13 05/13/2012 1737   LDLCALC 99 10/11/2022 0801   LDLCALC 127 (H) 08/05/2019 0904   Hepatic Function Panel     Component Value Date/Time   PROT 5.9 (L) 10/11/2022 0801   ALBUMIN 4.0 10/11/2022 0801   AST 30 10/11/2022 0801   ALT 35 (H) 10/11/2022 0801   ALKPHOS 71 10/11/2022 0801   BILITOT 0.3 10/11/2022 0801   BILIDIR 0.1 07/16/2017 0640   IBILI 0.6 07/16/2017 0640      Component Value Date/Time   TSH 2.170 01/16/2022 1004   Nutritional Lab Results   Component Value Date   VD25OH 59.0 10/11/2022   VD25OH 41.5 01/16/2022   VD25OH 48.5 01/11/2021     Assessment and Plan Assessment & Plan Type 2 diabetes mellitus Type 2 diabetes mellitus managed with Trulicity  without reported issues. Monitoring of blood glucose, A1c, insulin , renal function, and electrolytes is planned. - Refill Trulicity  prescription - Order labs for blood glucose, A1c, insulin , renal function, and electrolytes  Obesity Obesity managed with a pescetarian diet adhered to 50% of the time and exercise consisting of walking 30 minutes weekly. Weight loss of 1 pound in the last 6 weeks. Discussed stress impact on eating habits and the importance of mindful eating to avoid comfort snacking during stress. - Continue current diet and exercise regimen - Encourage mindful eating to avoid comfort snacking during stress - Discuss strategies for maintaining healthy eating habits during vacations and routine changes  Hyperlipidemia Hyperlipidemia managed with diet and exercise. Cholesterol levels to be monitored. - Order labs for cholesterol levels  Status post gastric bypass surgery Status post gastric bypass surgery with a focus on monitoring for potential vitamin deficiencies due to increased risk. Plan to check PTH and iron levels. - Order labs for PTH and iron levels  Vitamin D  deficiency Vitamin D  deficiency management with monitoring. Plan to check vitamin D  levels. - Order labs for vitamin D  levels    She was informed of the importance of frequent follow up visits to maximize her success with intensive lifestyle modifications for her multiple health conditions.    Jasmine Mesi, MD

## 2023-04-30 LAB — HEMOGLOBIN A1C
Est. average glucose Bld gHb Est-mCnc: 94 mg/dL
Hgb A1c MFr Bld: 4.9 % (ref 4.8–5.6)

## 2023-04-30 LAB — CMP14+EGFR
ALT: 32 IU/L (ref 0–32)
AST: 30 IU/L (ref 0–40)
Albumin: 4.1 g/dL (ref 3.8–4.9)
Alkaline Phosphatase: 70 IU/L (ref 44–121)
BUN/Creatinine Ratio: 10 (ref 9–23)
BUN: 9 mg/dL (ref 6–24)
Bilirubin Total: 0.3 mg/dL (ref 0.0–1.2)
CO2: 23 mmol/L (ref 20–29)
Calcium: 8.7 mg/dL (ref 8.7–10.2)
Chloride: 101 mmol/L (ref 96–106)
Creatinine, Ser: 0.9 mg/dL (ref 0.57–1.00)
Globulin, Total: 2 g/dL (ref 1.5–4.5)
Glucose: 83 mg/dL (ref 70–99)
Potassium: 4.3 mmol/L (ref 3.5–5.2)
Sodium: 138 mmol/L (ref 134–144)
Total Protein: 6.1 g/dL (ref 6.0–8.5)
eGFR: 77 mL/min/{1.73_m2} (ref >59)

## 2023-04-30 LAB — CBC WITH DIFFERENTIAL/PLATELET
Basophils Absolute: 0 10*3/uL (ref 0.0–0.2)
Basos: 1 %
EOS (ABSOLUTE): 0 10*3/uL (ref 0.0–0.4)
Eos: 1 %
Hematocrit: 38.1 % (ref 34.0–46.6)
Hemoglobin: 12.4 g/dL (ref 11.1–15.9)
Immature Grans (Abs): 0 10*3/uL (ref 0.0–0.1)
Immature Granulocytes: 0 %
Lymphocytes Absolute: 1.6 10*3/uL (ref 0.7–3.1)
Lymphs: 55 %
MCH: 31.3 pg (ref 26.6–33.0)
MCHC: 32.5 g/dL (ref 31.5–35.7)
MCV: 96 fL (ref 79–97)
Monocytes Absolute: 0.3 10*3/uL (ref 0.1–0.9)
Monocytes: 9 %
Neutrophils Absolute: 1 10*3/uL — ABNORMAL LOW (ref 1.4–7.0)
Neutrophils: 34 %
Platelets: 275 10*3/uL (ref 150–450)
RBC: 3.96 x10E6/uL (ref 3.77–5.28)
RDW: 12.7 % (ref 11.7–15.4)
WBC: 2.9 10*3/uL — ABNORMAL LOW (ref 3.4–10.8)

## 2023-04-30 LAB — VITAMIN D 25 HYDROXY (VIT D DEFICIENCY, FRACTURES): Vit D, 25-Hydroxy: 46.1 ng/mL (ref 30.0–100.0)

## 2023-04-30 LAB — VITAMIN B12: Vitamin B-12: 1641 pg/mL — ABNORMAL HIGH (ref 232–1245)

## 2023-04-30 LAB — TSH: TSH: 2.06 u[IU]/mL (ref 0.450–4.500)

## 2023-04-30 LAB — IRON AND TIBC
Iron Saturation: 31 % (ref 15–55)
Iron: 80 ug/dL (ref 27–159)
Total Iron Binding Capacity: 262 ug/dL (ref 250–450)
UIBC: 182 ug/dL (ref 131–425)

## 2023-04-30 LAB — LIPID PANEL WITH LDL/HDL RATIO
Cholesterol, Total: 171 mg/dL (ref 100–199)
HDL: 65 mg/dL (ref >39)
LDL Chol Calc (NIH): 97 mg/dL (ref 0–99)
LDL/HDL Ratio: 1.5 ratio (ref 0.0–3.2)
Triglycerides: 41 mg/dL (ref 0–149)
VLDL Cholesterol Cal: 9 mg/dL (ref 5–40)

## 2023-04-30 LAB — FOLATE: Folate: >20 ng/mL (ref >3.0)

## 2023-04-30 LAB — INSULIN, RANDOM: INSULIN: 3 u[IU]/mL (ref 2.6–24.9)

## 2023-04-30 LAB — FERRITIN: Ferritin: 158 ng/mL — ABNORMAL HIGH (ref 15–150)

## 2023-05-04 ENCOUNTER — Other Ambulatory Visit (HOSPITAL_COMMUNITY): Payer: Self-pay

## 2023-05-06 ENCOUNTER — Other Ambulatory Visit: Payer: Self-pay

## 2023-05-07 ENCOUNTER — Encounter: Payer: Self-pay | Admitting: Psychology

## 2023-05-14 ENCOUNTER — Telehealth (INDEPENDENT_AMBULATORY_CARE_PROVIDER_SITE_OTHER): Payer: Self-pay | Admitting: Family Medicine

## 2023-05-14 NOTE — Telephone Encounter (Signed)
 Good morning!  Patient has concerns about her lab results and has requested that Dr B give her a call to go over them. I told her that she may get a call from a CMA instead.  Thanks!

## 2023-05-15 ENCOUNTER — Other Ambulatory Visit (HOSPITAL_COMMUNITY): Payer: Self-pay

## 2023-05-15 ENCOUNTER — Other Ambulatory Visit: Payer: Self-pay

## 2023-05-15 NOTE — Telephone Encounter (Signed)
 There is nothing urgent on her labs; we will go voer tham  at her next visit or she can schedule a in person or virtual visit be fore that if she likes to discuss further.

## 2023-05-20 ENCOUNTER — Encounter: Attending: Psychology | Admitting: Psychology

## 2023-05-20 DIAGNOSIS — F32A Depression, unspecified: Secondary | ICD-10-CM | POA: Diagnosis not present

## 2023-05-20 DIAGNOSIS — R41840 Attention and concentration deficit: Secondary | ICD-10-CM | POA: Diagnosis not present

## 2023-05-20 DIAGNOSIS — F419 Anxiety disorder, unspecified: Secondary | ICD-10-CM | POA: Diagnosis not present

## 2023-05-20 DIAGNOSIS — F4310 Post-traumatic stress disorder, unspecified: Secondary | ICD-10-CM | POA: Diagnosis not present

## 2023-05-21 ENCOUNTER — Other Ambulatory Visit (HOSPITAL_COMMUNITY): Payer: Self-pay
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
## 2023-05-28 ENCOUNTER — Ambulatory Visit: Payer: Self-pay

## 2023-05-28 NOTE — Telephone Encounter (Signed)
 Appointment scheduled.

## 2023-05-28 NOTE — Telephone Encounter (Signed)
 Chief Complaint: Arm Pain/Bilateral leg swelling Symptoms: 15 minute period of left arm pain on Friday, bilateral lower leg and feet swelling.  Frequency: Friday Pertinent Negatives: Patient denies CP, SOB Disposition: [] ED /[] Urgent Care (no appt availability in office) / [x] Appointment(In office/virtual)/ []  [facility]/ [] Home Care/ [] Refused Recommended Disposition /[] [facility]/ []  Follow-up with PCP Additional Notes: patient calling with concerns for a 15 minute period of left arm pain with numbness and tingling on Friday along with bilateral lower leg and feet swelling that also started on Friday. Patient denies pain to arm since 15 minute period on Friday. Patient reports her feet and legs "feel weird" but no pain. No CP or SOB. Patient is recommended to be seen within three days per protocol. Appointment scheduled for 05/29/2023 at 9:00 AM. Patient verbalized understanding and all questions answered.    Copied from CRM [id number]. Topic: Clinical - Red Word Triage >> May 28, 2023  8:57 AM Juluis Ok wrote: [facility] that prompted transfer to Nurse Triage: lt arm pain, swelling in lt arm and both legs Reason for Disposition  [1] MILD swelling of both ankles (i.e., pedal edema) AND [2] new-onset or worsening  [1] MODERATE pain (e.g., interferes with normal activities) AND [2] present > 3 days  Answer Assessment - Initial Assessment Questions 1. ONSET: "When did the swelling start?" (e.g., minutes, hours, days)     Started on friday 2. LOCATION: "What part of the leg is swollen?"  "Are both legs swollen or just one leg?"     Bilateral legs-from mid calf down to feet 3. SEVERITY: "How bad is the swelling?" (e.g., localized; mild, moderate, severe)   - Localized: Small area of swelling localized to one leg.   - MILD pedal edema: Swelling limited to foot and ankle, pitting edema < 1/4 inch (6 mm) deep, rest and elevation eliminate most or all swelling.   - MODERATE  edema: Swelling of lower leg to knee, pitting edema > 1/4 inch (6 mm) deep, rest and elevation only partially reduce swelling.   - SEVERE edema: Swelling extends above knee, facial or hand swelling present.      Mild-Moderate 4. REDNESS: "Does the swelling look red or infected?"     no 5. PAIN: "Is the swelling painful to touch?" If Yes, ask: "How painful is it?"   (Scale 1-10; mild, moderate or severe)     Mild-feet feel "heavy" 6. FEVER: "Do you have a fever?" If Yes, ask: "What is it, how was it measured, and when did it start?"      no 7. CAUSE: "What do you think is causing the leg swelling?"     unsure 8. MEDICAL HISTORY: "Do you have a history of blood clots (e.g., DVT), cancer, heart failure, kidney disease, or liver failure?"     no 9. RECURRENT SYMPTOM: "Have you had leg swelling before?" If Yes, ask: "When was the last time?" "What happened that time?"     Yes but not this bad. 10. OTHER SYMPTOMS: "Do you have any other symptoms?" (e.g., chest pain, difficulty breathing)       Chest pain in the past with hx of anxiety 11. PREGNANCY: "Is there any chance you are pregnant?" "When was your last menstrual period?"       no  Answer Assessment - Initial Assessment Questions 1. ONSET: "When did the pain start?"     Friday 2. LOCATION: "Where is the pain located?"     Left arm  3. PAIN: "How bad is the pain?" (Scale 1-10; or mild, moderate, severe)   - MILD (1-3): Doesn't interfere with normal activities.   - MODERATE (4-7): Interferes with normal activities (e.g., work or school) or awakens from sleep.   - SEVERE (8-10): Excruciating pain, unable to do any normal activities, unable to hold a cup of water .     Moderate-severe pain for about 15 minutes and then went away 4. WORK OR EXERCISE: "Has there been any recent work or exercise that involved this part of the body?"     no 5. CAUSE: "What do you think is causing the arm pain?"     Unsure-pain lasted fifteen minutes and then  went away 6. OTHER SYMPTOMS: "Do you have any other symptoms?" (e.g., neck pain, swelling, rash, fever, numbness, weakness)     Numbness and tingling  Protocols used: Arm Pain-A-AH, Leg Swelling and Edema-A-AH

## 2023-05-29 ENCOUNTER — Telehealth: Admitting: Psychiatry

## 2023-05-29 ENCOUNTER — Encounter: Payer: Self-pay | Admitting: Family Medicine

## 2023-05-29 ENCOUNTER — Ambulatory Visit: Payer: Self-pay | Admitting: Family Medicine

## 2023-05-29 ENCOUNTER — Encounter: Payer: Self-pay | Admitting: Psychiatry

## 2023-05-29 VITALS — BP 136/81 | HR 56 | Resp 16 | Ht 62.0 in | Wt 141.4 lb

## 2023-05-29 DIAGNOSIS — F3341 Major depressive disorder, recurrent, in partial remission: Secondary | ICD-10-CM | POA: Diagnosis not present

## 2023-05-29 DIAGNOSIS — M7989 Other specified soft tissue disorders: Secondary | ICD-10-CM | POA: Insufficient documentation

## 2023-05-29 DIAGNOSIS — F411 Generalized anxiety disorder: Secondary | ICD-10-CM | POA: Diagnosis not present

## 2023-05-29 DIAGNOSIS — G8929 Other chronic pain: Secondary | ICD-10-CM | POA: Diagnosis not present

## 2023-05-29 DIAGNOSIS — M4802 Spinal stenosis, cervical region: Secondary | ICD-10-CM

## 2023-05-29 DIAGNOSIS — M25512 Pain in left shoulder: Secondary | ICD-10-CM | POA: Diagnosis not present

## 2023-05-29 MED ORDER — FUROSEMIDE 20 MG PO TABS: 20.0000 mg | ORAL_TABLET | ORAL | 2 refills | Status: DC

## 2023-05-29 MED ORDER — PREDNISONE 20 MG PO TABS: 20.0000 mg | ORAL_TABLET | Freq: Two times a day (BID) | ORAL | 0 refills | Status: AC

## 2023-05-29 NOTE — Patient Instructions (Signed)
 Continue lamotrigine  200 mg daily  Increase buspar  30 mg twice a day  Continue prazosin  2 mg at night  Next appointment: 6/27 at 11 am

## 2023-05-29 NOTE — Progress Notes (Signed)
 Established Patient Office Visit   Subjective  Patient ID: Brooke [name], female    DOB: 1970-09-02  Age: 53 y.o. MRN: [id number]  Chief Complaint  Patient presents with   Leg Swelling    Has been having pitting edema in both legs and ankles for about a week   Shoulder Pain    Left arm pain started Fri with some tingling in her arm down to her first 3 fingers     She  has a past medical history of Acid reflux, Amenorrhea (02/06/2012), Anxiety, Arthritis, B12 deficiency, Breast lump (08/06/2019), Cervical radiculitis, Chest pain, Chronic constipation, DDD (degenerative disc disease), lumbar, Deep venous thrombosis (HCC) (08/09/2021), Depression, Depression, Dizzy spells, Elevated vitamin B12 level (05/04/2019), Esophageal dysphagia (11/19/2012), Facial numbness, Family history of systemic lupus erythematosus (10/22/2019), Fatty liver, GERD (gastroesophageal reflux disease), H. pylori infection, Headache(784.0) (04/01/2012), Heart palpitations (01/2017), High cholesterol, History of anemia, History of hiatal hernia, Hypersomnia due to another medical condition (06/12/2017), Hypertension, Insomnia, Intractable migraine with visual aura and without status migrainosus (01/23/2017), Iron deficiency anemia, Irregular periods (08/06/2019), Joint pain, Lactose intolerance, LUQ pain (11/19/2012), Menopausal symptom (08/06/2019), Migraines, Near syncope (01/2017), Numbness and tingling (10/08/2016), Numbness and tingling in left arm, Obesity, Other malaise and fatigue (05/19/2012), Panic attacks, PTSD (post-traumatic stress disorder), Raynaud's phenomenon without gangrene (10/22/2019), Sciatica, Seizures (HCC), Slurred speech (11/07/2016), Small bowel obstruction (HCC) (07/20/2017), Spells of speech arrest (01/23/2017), Transient cerebral ischemia (10/08/2016), Vitamin D  deficiency, and Word finding difficulty (01/23/2017).  Patient reports that the swelling in both legs began suddenly about one week ago and  has been constant since onset. They describe a sensation of warmth and tightness in the affected areas but deny redness or pain. There is no history of recent injury, surgery, trauma, or prolonged immobility. However, the patient does report associated symptoms of chest pain and a rapid heartbeat. They deny any history of heart disease, high blood pressure, kidney or liver disease, or prior blood clots (DVT or PE). The patient has a history of varicose veins and controlled diabetes. They are not currently taking any new medications, supplements, or herbal remedies. There are no recent changes in diet, salt intake, or weight. The patient does not sit or stand for prolonged periods and has not used compression stockings in the past    Review of Systems  Eyes:  Negative for blurred vision.  Respiratory:  Positive for shortness of breath.   Cardiovascular:  Positive for chest pain and palpitations.  Neurological:  Negative for headaches.      Objective:     BP 136/81   Pulse (!) 56   Resp 16   Ht 5\' 2"  (1.575 m)   Wt 141 lb 6.4 oz (64.1 kg)   LMP 09/01/2020 (Approximate)   SpO2 99%   BMI 25.86 kg/m  BP Readings from Last 3 Encounters:  05/29/23 136/81  04/29/23 111/72  03/14/23 126/77      Physical Exam Vitals reviewed.  Constitutional:      General: She is not in acute distress.    Appearance: Normal appearance. She is not ill-appearing, toxic-appearing or diaphoretic.  HENT:     Head: Normocephalic.  Eyes:     General:        Right eye: No discharge.        Left eye: No discharge.     Conjunctiva/sclera: Conjunctivae normal.  Cardiovascular:     Rate and Rhythm: Normal rate.     Pulses: Normal pulses.  Heart sounds: Normal heart sounds.  Pulmonary:     Effort: Pulmonary effort is normal. No respiratory distress.     Breath sounds: Normal breath sounds.  Musculoskeletal:        General: Swelling and tenderness present. Normal range of motion.     Right lower leg:  Edema present.     Left lower leg: Edema present.  Skin:    General: Skin is warm and dry.     Capillary Refill: Capillary refill takes less than 2 seconds.  Neurological:     Mental Status: She is alert.     Coordination: Coordination normal.     Gait: Gait normal.  Psychiatric:        Mood and Affect: Mood normal.        Behavior: Behavior normal.      No results found for any visits on 05/29/23.  The 10-year ASCVD risk score (Arnett DK, et al., 2019) is: 8.3%    Assessment & Plan:  Cervical stenosis of spinal canal -     Ambulatory referral to Neurosurgery  Leg swelling Assessment & Plan:  Patient reports leg swelling and calf pain that worsens with dorsiflexion  Ordered US  Venous Doppler  Awaiting results will treat accordingly.  Advise patient to visit ED if notice any new alarming symptoms such as chest pain, shortness of breath, headaches, or dizziness. Discussed do not stand or sit in on spot for a long time, Avoid tight clothing that restricts blood flow in your legs. Increase fluid intake with water  to avoid dehydration .   Orders: -     Furosemide; Take 1 tablet (20 mg total) by mouth every other day.  Dispense: 30 tablet; Refill: 2 -     US  Venous Img Lower Bilateral (DVT); Future  Chronic left shoulder pain Assessment & Plan: Most likely due cervical stenosis, patient reports radiates to left arm with numbness and tingling Reviewed Cervical SPINE Mri result with patient Trial on Prednisone  20 mg twice day x 5 days Referral placed to neurosurgery    Orders: -     predniSONE ; Take 1 tablet (20 mg total) by mouth 2 (two) times daily with a meal for 5 days.  Dispense: 10 tablet; Refill: 0    Return if symptoms worsen or fail to improve.   Avelino Lek Amber Bail, FNP

## 2023-05-29 NOTE — Assessment & Plan Note (Signed)
 Most likely due cervical stenosis, patient reports radiates to left arm with numbness and tingling Reviewed Cervical SPINE Mri result with patient Trial on Prednisone  20 mg twice day x 5 days Referral placed to neurosurgery

## 2023-05-29 NOTE — Patient Instructions (Addendum)
 11/12/20 MRI RESULTS  Your MRI results show some changes in the bones and spaces around your cervical spine (the neck area of your spine):  At the C6-C7 level (lower part of your neck), you have:  Mild spinal canal stenosis - this means the space around your spinal cord is slightly narrowed.  Moderate to severe narrowing on the left side and mild narrowing on the right where the nerves exit the spine. This can sometimes cause symptoms like pain, numbness, or tingling in the arms or hands, especially on the left side.  At the C4-C5 and C5-C6 levels (middle of your neck), you also have:  Mild narrowing of the spinal canal.  Moderate narrowing on both sides of the openings where the nerves come out. This may contribute to stiffness or nerve-related symptoms in both arms.  At the C3-C4 level (upper part of your neck), there's:  Mild to moderate narrowing on the left side of the nerve passage.

## 2023-05-29 NOTE — Assessment & Plan Note (Signed)
 Patient reports leg swelling and calf pain that worsens with dorsiflexion  Ordered US  Venous Doppler  Awaiting results will treat accordingly.  Advise patient to visit ED if notice any new alarming symptoms such as chest pain, shortness of breath, headaches, or dizziness. Discussed do not stand or sit in on spot for a long time, Avoid tight clothing that restricts blood flow in your legs. Increase fluid intake with water  to avoid dehydration .

## 2023-06-03 ENCOUNTER — Other Ambulatory Visit (HOSPITAL_COMMUNITY): Payer: Self-pay

## 2023-06-03 ENCOUNTER — Other Ambulatory Visit: Payer: Self-pay
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
## 2023-06-24 ENCOUNTER — Other Ambulatory Visit (HOSPITAL_COMMUNITY): Payer: Self-pay

## 2023-06-24 DIAGNOSIS — M65841 Other synovitis and tenosynovitis, right hand: Secondary | ICD-10-CM | POA: Diagnosis not present

## 2023-06-26 ENCOUNTER — Other Ambulatory Visit: Payer: Self-pay

## 2023-06-28 ENCOUNTER — Other Ambulatory Visit: Payer: Self-pay

## 2023-06-28 ENCOUNTER — Other Ambulatory Visit (HOSPITAL_COMMUNITY): Payer: Self-pay

## 2023-06-28 ENCOUNTER — Encounter: Payer: Self-pay | Admitting: Psychiatry

## 2023-06-28 ENCOUNTER — Telehealth (INDEPENDENT_AMBULATORY_CARE_PROVIDER_SITE_OTHER): Admitting: Psychiatry

## 2023-06-28 DIAGNOSIS — F411 Generalized anxiety disorder: Secondary | ICD-10-CM

## 2023-06-28 DIAGNOSIS — F3341 Major depressive disorder, recurrent, in partial remission: Secondary | ICD-10-CM | POA: Diagnosis not present

## 2023-06-28 DIAGNOSIS — M65311 Trigger thumb, right thumb: Secondary | ICD-10-CM | POA: Diagnosis not present

## 2023-06-28 MED ORDER — PREDNISONE 10 MG (21) PO TBPK
ORAL_TABLET | ORAL | 0 refills | Status: DC
  Filled 2023-06-28: qty 21, 6d supply, fill #0

## 2023-06-28 MED ORDER — PRAZOSIN HCL 1 MG PO CAPS
1.0000 mg | ORAL_CAPSULE | Freq: Every day | ORAL | 1 refills | Status: DC
  Filled 2023-06-28: qty 30, 30d supply, fill #0
  Filled 2023-07-26: qty 30, 30d supply, fill #1

## 2023-06-28 NOTE — Patient Instructions (Signed)
 Continue lamotrigine  200 mg daily  Continue Buspar  30 mg twice a day Increase prazosin  3 mg at night  Next appointment: 7/31 at 11 30

## 2023-07-01 ENCOUNTER — Other Ambulatory Visit (HOSPITAL_COMMUNITY): Payer: Self-pay

## 2023-07-01 ENCOUNTER — Ambulatory Visit (INDEPENDENT_AMBULATORY_CARE_PROVIDER_SITE_OTHER): Admitting: Family Medicine

## 2023-07-01 ENCOUNTER — Encounter (INDEPENDENT_AMBULATORY_CARE_PROVIDER_SITE_OTHER): Payer: Self-pay | Admitting: Family Medicine

## 2023-07-01 ENCOUNTER — Other Ambulatory Visit: Payer: Self-pay | Admitting: Gastroenterology

## 2023-07-01 ENCOUNTER — Other Ambulatory Visit: Payer: Self-pay

## 2023-07-01 VITALS — BP 112/64 | HR 61 | Temp 97.9°F | Ht 62.0 in | Wt 129.0 lb

## 2023-07-01 DIAGNOSIS — Z794 Long term (current) use of insulin: Secondary | ICD-10-CM | POA: Diagnosis not present

## 2023-07-01 DIAGNOSIS — Z7985 Long-term (current) use of injectable non-insulin antidiabetic drugs: Secondary | ICD-10-CM

## 2023-07-01 DIAGNOSIS — Z9884 Bariatric surgery status: Secondary | ICD-10-CM

## 2023-07-01 DIAGNOSIS — R5383 Other fatigue: Secondary | ICD-10-CM | POA: Diagnosis not present

## 2023-07-01 DIAGNOSIS — Z6823 Body mass index (BMI) 23.0-23.9, adult: Secondary | ICD-10-CM | POA: Diagnosis not present

## 2023-07-01 DIAGNOSIS — R1013 Epigastric pain: Secondary | ICD-10-CM | POA: Diagnosis not present

## 2023-07-01 DIAGNOSIS — E860 Dehydration: Secondary | ICD-10-CM

## 2023-07-01 DIAGNOSIS — E119 Type 2 diabetes mellitus without complications: Secondary | ICD-10-CM | POA: Diagnosis not present

## 2023-07-01 DIAGNOSIS — E1169 Type 2 diabetes mellitus with other specified complication: Secondary | ICD-10-CM

## 2023-07-01 MED ORDER — TRULICITY 1.5 MG/0.5ML ~~LOC~~ SOAJ
1.5000 mg | SUBCUTANEOUS | 0 refills | Status: DC
  Filled 2023-07-01 – 2023-07-09 (×3): qty 2, 28d supply, fill #0
  Filled 2023-08-05: qty 2, 28d supply, fill #1
  Filled 2023-09-05: qty 2, 28d supply, fill #2

## 2023-07-01 MED ORDER — PANTOPRAZOLE SODIUM 40 MG PO TBEC
40.0000 mg | DELAYED_RELEASE_TABLET | Freq: Two times a day (BID) | ORAL | 0 refills | Status: DC
  Filled 2023-07-01: qty 60, 30d supply, fill #0

## 2023-07-01 NOTE — Telephone Encounter (Signed)
 Please arrange follow up appt for further refills.

## 2023-07-01 NOTE — Progress Notes (Signed)
 Office: [phone]  /  Fax: [phone]  WEIGHT SUMMARY AND BIOMETRICS  Anthropometric Measurements Height: 5' 2 (1.575 m) Weight: 129 lb (58.5 kg) BMI (Calculated): 23.59 Weight at Last Visit: 137 lb Weight Lost Since Last Visit: 8 lb Weight Gained Since Last Visit: 0 Total Weight Loss (lbs): 85 lb (38.6 kg)   Body Composition  Body Fat %: 31 % Fat Mass (lbs): 40.2 lbs Muscle Mass (lbs): 85 lbs Total Body Water  (lbs): 65.8 lbs Visceral Fat Rating : 6   Other Clinical Data Fasting: Yes Labs: No Today's Visit #: 35    Chief Complaint: OBESITY    History of Present Illness Brooke [name] is a 53 year old female with obesity and type two diabetes who presents for obesity treatment.  She has been adhering to a pescetarian diet with 85% compliance, resulting in an eight-pound weight loss over the past eight weeks. She does not follow a formal exercise regimen.  She is currently taking Trulicity  3 mg weekly for type two diabetes and requests a refill. She experiences stomach cramps and nausea after eating, regardless of food type, and describes a sensation of cramping in the stomach area. She also experiences diarrhea and uses Miralax  to manage constipation.  She has a history of Roux-en-Y gastric bypass surgery and reports epigastric pain after every meal, leading to unintentional weight loss and meal skipping. She continues to take Protonix  twice daily.  She is experiencing stress due to her mother's recent stroke in May, which has affected her mother's memory and mobility. Her father also had a stroke last year. She and her sister are involved in caregiving responsibilities, requiring frequent travel between [address] and [place].  She experiences occasional dizziness, sometimes while driving, and mild headaches. She questions if her blood pressure is low, noting a reading of 112/64.      PHYSICAL EXAM:  Blood pressure 112/64, pulse 61, temperature 97.9 F (36.6  C), height 5' 2 (1.575 m), weight 129 lb (58.5 kg), last menstrual period 09/01/2020, SpO2 100%. Body mass index is 23.59 kg/m.  DIAGNOSTIC DATA REVIEWED:  BMET    Component Value Date/Time   NA 138 04/29/2023 0756   K 4.3 04/29/2023 0756   CL 101 04/29/2023 0756   CO2 23 04/29/2023 0756   GLUCOSE 83 04/29/2023 0756   GLUCOSE 83 08/08/2022 1230   BUN 9 04/29/2023 0756   CREATININE 0.90 04/29/2023 0756   CREATININE 1.00 08/05/2019 0904   CALCIUM 8.7 04/29/2023 0756   GFRNONAA >60 08/08/2022 1230   GFRNONAA 66 08/05/2019 0904   GFRAA 94 02/22/2020 0839   GFRAA 77 08/05/2019 0904   Lab Results  Component Value Date   HGBA1C 4.9 04/29/2023   HGBA1C 5.3 08/05/2019   Lab Results  Component Value Date   INSULIN  3.0 04/29/2023   INSULIN  5.9 09/14/2019   Lab Results  Component Value Date   TSH 2.060 04/29/2023   CBC    Component Value Date/Time   WBC 2.9 (L) 04/29/2023 0756   WBC 3.6 (L) 08/08/2022 1230   RBC 3.96 04/29/2023 0756   RBC 4.13 08/08/2022 1230   HGB 12.4 04/29/2023 0756   HCT 38.1 04/29/2023 0756   PLT 275 04/29/2023 0756   MCV 96 04/29/2023 0756   MCH 31.3 04/29/2023 0756   MCH 30.8 08/08/2022 1230   MCHC 32.5 04/29/2023 0756   MCHC 32.4 08/08/2022 1230   RDW 12.7 04/29/2023 0756   Iron Studies    Component Value Date/Time  IRON 80 04/29/2023 0756   TIBC 262 04/29/2023 0756   FERRITIN 158 (H) 04/29/2023 0756   IRONPCTSAT 31 04/29/2023 0756   IRONPCTSAT 38 09/03/2019 0809   Lipid Panel     Component Value Date/Time   CHOL 171 04/29/2023 0756   TRIG 41 04/29/2023 0756   HDL 65 04/29/2023 0756   CHOLHDL 3.0 03/24/2020 1235   CHOLHDL 3.4 08/05/2019 0904   VLDL 13 05/13/2012 1737   LDLCALC 97 04/29/2023 0756   LDLCALC 127 (H) 08/05/2019 0904   Hepatic Function Panel     Component Value Date/Time   PROT 6.1 04/29/2023 0756   ALBUMIN 4.1 04/29/2023 0756   AST 30 04/29/2023 0756   ALT 32 04/29/2023 0756   ALKPHOS 70 04/29/2023 0756    BILITOT 0.3 04/29/2023 0756   BILIDIR 0.1 07/16/2017 0640   IBILI 0.6 07/16/2017 0640      Component Value Date/Time   TSH 2.060 04/29/2023 0756   Nutritional Lab Results  Component Value Date   VD25OH 46.1 04/29/2023   VD25OH 59.0 10/11/2022   VD25OH 41.5 01/16/2022     Assessment and Plan Assessment & Plan Epigastric Pain Epigastric pain worsens postprandially and after every meal now, with nausea and stomach cramps. She has undergone Roux-en-Y gastric bypass in the past. Differential diagnosis includes ulcer, gastritis, strictures or other gastrointestinal issues which may or may not be related to her surgery. She has 8 lbs of unintentional weight loss due to missing meals trying to avoid the pain. - Refer to GI specialist, Dr. Debbie, for evaluation soon, will call their office to try to expedite the appointment    Type 2 Diabetes Mellitus Type 2 diabetes managed with Trulicity , currently at 3 mg weekly. Hemoglobin A1c was 4.9% in April 2025, indicating good glycemic control. She experiences gastrointestinal issues, possibly related to Trulicity . Reducing the dose may alleviate symptoms without significantly impacting weight control. - Decrease Trulicity  dose to 1.5 mg weekly - Provide 90-day prescription for Trulicity  - Monitor blood glucose levels  Obesity Obesity is well-controlled with a pescetarian diet, adhered to 85% of the time. She has lost 8 pounds in the last 8 weeks without formal exercise. Current BMI is 23, which is optimal, but further reduction may cause health issues, especially with current gastrointestinal symptoms. - Continue pescetarian diet - Monitor weight and BMI, Goal to avoid further weight loss.  Dehydration Reports dizziness, especially when driving, and mild headaches, indicative of dehydration. Blood pressure is 112/64 mmHg, within normal range. Emphasized the importance of adequate hydration, especially in the heat. - Encourage increased  hydration  Caregiver Fatigue Experiencing caregiver fatigue due to caring for her parents post-stroke. She is seeing a psychologist for support. Stress may contribute to weight loss and decreased appetite. - Continue seeing psychologist for support - Encourage participation in support groups      She was informed of the importance of frequent follow up visits to maximize her success with intensive lifestyle modifications for her multiple health conditions.    Louann Penton, MD

## 2023-07-06 NOTE — Progress Notes (Signed)
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 recommendations are ultimately deferred to the patient's treating physician. While the provider is hesitant to make a formal diagnosis of ADHD at this time, there are fairly significant indications that some symptoms are linked to ADHD.   Unrelated to ADHD medications, the patient is on an AED medication (Keppra ) which can sometimes exacerbate psychiatric symptoms in some individuals. This risk increases in individuals with significant anxiety related difficulties. The provider strongly recommends the patient continue taking medications according to the instructions of her prescribing physicians, but discussion regarding medication side effects between the patient and prescribing physician may be appropriate.      Diagnosis: Other specified attention-deficit/hyperactivity disorder (unable to adequately rule out secondary factors in the current context)  Per records, and consistent with symptoms in current evaluation: Post-traumatic stress disorder   Major depressive  disorder Generalized anxiety disorder   Recommendations: Follow-up with the referring provider as planned. There is a reasonable likelihood that difficulties may be, at least in part, due to ADHD. However, diagnostic clarity/confidence is limited in the current clinical/environmental context. At present, the most salient need centers on mental health difficulties. Managing these is likely to provide the most benefit to overall wellness, and it will also support diagnostic clarity at re-eval. (See summary section later paragraphs for more detail). Treatment trial for ADHD would not be inappropriate given likelihood of diagnosis, but such decisions are deferred to the expertise and judgement of the treating physician.   Continue working with mental health treatment providers. Individual therapy is strongly recommended. Providers specializing in trauma treatment are preferred.  Plan for return for evaluation, and for cognitive testing, in 28-months. If mental health and environmental factors stabilize before than, an evaluation can be conducted sooner. If the referring provider wishes for the patient to undergo cognitive evaluation before than, the writer is willing to proceed despite the previously mentioned concerns.  Attend to sleep difficulties as able. Sleep hygiene tips are included in the report mailed to the patient.  Engage in self-care activities and healthy activities promoting stress relief. Reduce/eliminate marijuana use while developing alternative coping skills.  A possible discussion regarding medication side effects (Keppra ) between the patient and treating physician may be appropriate given the patient's mood/anxiety difficulties.  The patient and referring are encourage to reach out to the provider with any questions or concerns.             Evalene DOROTHA Riff, PsyD             Neuropsychologist   This report was generated using voice recognition software. While this  document has been  carefully reviewed, transcription errors may be present. I apologize in advance for any inconvenience. Please contact me if further clarification is needed.

## 2023-07-09 ENCOUNTER — Telehealth (HOSPITAL_COMMUNITY): Payer: Self-pay | Admitting: Pharmacist

## 2023-07-09 ENCOUNTER — Encounter: Payer: Self-pay | Admitting: Pharmacist

## 2023-07-09 ENCOUNTER — Other Ambulatory Visit (HOSPITAL_COMMUNITY): Payer: Self-pay

## 2023-07-09 ENCOUNTER — Other Ambulatory Visit: Payer: Self-pay

## 2023-07-09 ENCOUNTER — Telehealth (HOSPITAL_COMMUNITY): Payer: Self-pay

## 2023-07-09 NOTE — Telephone Encounter (Signed)
 Pharmacy Patient Advocate Encounter   Received notification from Pt Calls Messages that prior authorization for Trulicity  1.5MG /0.5ML auto-injectors  is required/requested.   Insurance verification completed.   The patient is insured through [employer] .   Per test claim: PA required; PA submitted to above mentioned insurance via CoverMyMeds Key/confirmation #/EOC [id number] Status is pending

## 2023-07-09 NOTE — Telephone Encounter (Signed)
 PA request has been Received. New Encounter has been or will be created for follow up. For additional info see Pharmacy Prior Auth telephone encounter from 07/09/23.

## 2023-07-10 ENCOUNTER — Other Ambulatory Visit (HOSPITAL_COMMUNITY): Payer: Self-pay

## 2023-07-10 ENCOUNTER — Telehealth (HOSPITAL_COMMUNITY): Payer: Self-pay | Admitting: Pharmacy Technician

## 2023-07-11 ENCOUNTER — Other Ambulatory Visit (HOSPITAL_COMMUNITY): Payer: Self-pay

## 2023-07-11 NOTE — Telephone Encounter (Signed)
 Pharmacy Patient Advocate Encounter  Received notification from [employer] that Prior Authorization for Trulicity  1.5MG /0.5ML auto-injectors  has been APPROVED from 07/09/23 to 01/09/24. Ran test claim, Copay is $4. This test claim was processed through [facility]- copay amounts may vary at other pharmacies due to pharmacy/plan contracts, or as the patient moves through the different stages of their insurance plan.   PA #/Case ID/Reference #: [id number]

## 2023-07-22 ENCOUNTER — Other Ambulatory Visit (HOSPITAL_COMMUNITY): Payer: Self-pay

## 2023-07-26 ENCOUNTER — Encounter: Payer: Self-pay | Admitting: Physician Assistant

## 2023-07-26 ENCOUNTER — Other Ambulatory Visit (HOSPITAL_COMMUNITY): Payer: Self-pay

## 2023-07-26 ENCOUNTER — Ambulatory Visit: Admitting: Orthopedic Surgery

## 2023-07-26 DIAGNOSIS — M65311 Trigger thumb, right thumb: Secondary | ICD-10-CM
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
## 2023-07-27 ENCOUNTER — Encounter: Payer: Self-pay | Admitting: Orthopedic Surgery
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
## 2023-07-30 ENCOUNTER — Other Ambulatory Visit (HOSPITAL_COMMUNITY): Payer: Self-pay

## 2023-07-30 ENCOUNTER — Other Ambulatory Visit: Payer: Self-pay | Admitting: Internal Medicine

## 2023-07-30 ENCOUNTER — Other Ambulatory Visit: Payer: Self-pay

## 2023-07-30 ENCOUNTER — Ambulatory Visit: Admitting: Physician Assistant

## 2023-07-30 MED ORDER — PANTOPRAZOLE SODIUM 40 MG PO TBEC
40.0000 mg | DELAYED_RELEASE_TABLET | Freq: Two times a day (BID) | ORAL | 0 refills | Status: DC
  Filled 2023-07-30: qty 60, 30d supply, fill #0

## 2023-08-01 ENCOUNTER — Telehealth: Admitting: Psychiatry

## 2023-08-01 ENCOUNTER — Encounter: Payer: Self-pay | Admitting: Psychiatry

## 2023-08-01 ENCOUNTER — Other Ambulatory Visit (HOSPITAL_COMMUNITY): Payer: Self-pay

## 2023-08-01 ENCOUNTER — Other Ambulatory Visit: Payer: Self-pay

## 2023-08-01 DIAGNOSIS — F3341 Major depressive disorder, recurrent, in partial remission: Secondary | ICD-10-CM

## 2023-08-01 DIAGNOSIS — F411 Generalized anxiety disorder: Secondary | ICD-10-CM

## 2023-08-01 MED ORDER — LAMOTRIGINE 100 MG PO TABS
200.0000 mg | ORAL_TABLET | Freq: Every day | ORAL | 0 refills | Status: DC
  Filled 2023-08-18: qty 180, 90d supply, fill #0

## 2023-08-01 MED ORDER — BUSPIRONE HCL 30 MG PO TABS
30.0000 mg | ORAL_TABLET | Freq: Two times a day (BID) | ORAL | 0 refills | Status: AC
  Filled 2023-08-01: qty 60, 30d supply, fill #0
  Filled 2023-08-26: qty 60, 30d supply, fill #1
  Filled 2023-09-25: qty 60, 30d supply, fill #2

## 2023-08-01 MED ORDER — PRAZOSIN HCL 2 MG PO CAPS
2.0000 mg | ORAL_CAPSULE | Freq: Every day | ORAL | 0 refills | Status: DC
  Filled 2023-08-26: qty 90, 90d supply, fill #0

## 2023-08-01 NOTE — Patient Instructions (Addendum)
 Continue lamotrigine  200 mg daily   Continue Buspar  30 mg twice a day  Decrease prazosin  2 mg at night  Referral to thearap[facility] at [facility] (or [facility] [phone]) Next appointment: 9/25 at 11:30

## 2023-08-05 ENCOUNTER — Encounter: Payer: Self-pay | Admitting: Physician Assistant

## 2023-08-05 ENCOUNTER — Encounter: Payer: Self-pay | Admitting: Orthopedic Surgery

## 2023-08-05 ENCOUNTER — Other Ambulatory Visit (HOSPITAL_COMMUNITY): Payer: Self-pay

## 2023-08-18 ENCOUNTER — Other Ambulatory Visit: Payer: Self-pay | Admitting: Adult Health

## 2023-08-19 ENCOUNTER — Other Ambulatory Visit: Payer: Self-pay

## 2023-08-19 ENCOUNTER — Other Ambulatory Visit (HOSPITAL_COMMUNITY): Payer: Self-pay

## 2023-08-19 MED ORDER — LEVETIRACETAM 750 MG PO TABS
750.0000 mg | ORAL_TABLET | Freq: Two times a day (BID) | ORAL | 0 refills | Status: DC
  Filled 2023-08-19: qty 60, 30d supply, fill #0

## 2023-08-26 ENCOUNTER — Other Ambulatory Visit (HOSPITAL_COMMUNITY): Payer: Self-pay

## 2023-08-26 ENCOUNTER — Other Ambulatory Visit: Payer: Self-pay

## 2023-08-26 ENCOUNTER — Other Ambulatory Visit: Payer: Self-pay | Admitting: Gastroenterology

## 2023-08-26 MED ORDER — PANTOPRAZOLE SODIUM 40 MG PO TBEC
40.0000 mg | DELAYED_RELEASE_TABLET | Freq: Two times a day (BID) | ORAL | 2 refills | Status: DC
  Filled 2023-08-26: qty 60, 30d supply, fill #0
  Filled 2023-09-25: qty 60, 30d supply, fill #1
  Filled 2023-10-23: qty 60, 30d supply, fill #2

## 2023-08-29 ENCOUNTER — Emergency Department (HOSPITAL_COMMUNITY)

## 2023-08-29 ENCOUNTER — Inpatient Hospital Stay (HOSPITAL_COMMUNITY)
Admission: EM | Admit: 2023-08-29 | Discharge: 2023-08-31 | DRG: 417 | Disposition: A | Discharge status: Home / Self Care | Attending: Internal Medicine | Admitting: Internal Medicine

## 2023-08-29 ENCOUNTER — Encounter (HOSPITAL_COMMUNITY): Payer: Self-pay

## 2023-08-29 ENCOUNTER — Other Ambulatory Visit: Payer: Self-pay

## 2023-08-29 DIAGNOSIS — F411 Generalized anxiety disorder: Secondary | ICD-10-CM | POA: Diagnosis not present

## 2023-08-29 DIAGNOSIS — K828 Other specified diseases of gallbladder: Secondary | ICD-10-CM | POA: Diagnosis present

## 2023-08-29 DIAGNOSIS — Z83438 Family history of other disorder of lipoprotein metabolism and other lipidemia: Secondary | ICD-10-CM

## 2023-08-29 DIAGNOSIS — I10 Essential (primary) hypertension: Secondary | ICD-10-CM | POA: Diagnosis not present

## 2023-08-29 DIAGNOSIS — Z9884 Bariatric surgery status: Secondary | ICD-10-CM | POA: Diagnosis not present

## 2023-08-29 DIAGNOSIS — Z90710 Acquired absence of both cervix and uterus: Secondary | ICD-10-CM

## 2023-08-29 DIAGNOSIS — Z7985 Long-term (current) use of injectable non-insulin antidiabetic drugs: Secondary | ICD-10-CM | POA: Diagnosis not present

## 2023-08-29 DIAGNOSIS — R748 Abnormal levels of other serum enzymes: Secondary | ICD-10-CM

## 2023-08-29 DIAGNOSIS — Z83719 Family history of colon polyps, unspecified: Secondary | ICD-10-CM

## 2023-08-29 DIAGNOSIS — E538 Deficiency of other specified B group vitamins: Secondary | ICD-10-CM | POA: Diagnosis present

## 2023-08-29 DIAGNOSIS — G40909 Epilepsy, unspecified, not intractable, without status epilepticus: Secondary | ICD-10-CM | POA: Diagnosis not present

## 2023-08-29 DIAGNOSIS — D509 Iron deficiency anemia, unspecified: Secondary | ICD-10-CM | POA: Diagnosis not present

## 2023-08-29 DIAGNOSIS — K8020 Calculus of gallbladder without cholecystitis without obstruction: Secondary | ICD-10-CM | POA: Diagnosis not present

## 2023-08-29 DIAGNOSIS — Z8249 Family history of ischemic heart disease and other diseases of the circulatory system: Secondary | ICD-10-CM | POA: Diagnosis not present

## 2023-08-29 DIAGNOSIS — Z825 Family history of asthma and other chronic lower respiratory diseases: Secondary | ICD-10-CM

## 2023-08-29 DIAGNOSIS — G43719 Chronic migraine without aura, intractable, without status migrainosus: Secondary | ICD-10-CM | POA: Diagnosis present

## 2023-08-29 DIAGNOSIS — Z833 Family history of diabetes mellitus: Secondary | ICD-10-CM

## 2023-08-29 DIAGNOSIS — G43709 Chronic migraine without aura, not intractable, without status migrainosus: Secondary | ICD-10-CM | POA: Diagnosis present

## 2023-08-29 DIAGNOSIS — K859 Acute pancreatitis without necrosis or infection, unspecified: Secondary | ICD-10-CM | POA: Diagnosis present

## 2023-08-29 DIAGNOSIS — E7800 Pure hypercholesterolemia, unspecified: Secondary | ICD-10-CM | POA: Diagnosis not present

## 2023-08-29 DIAGNOSIS — Z8673 Personal history of transient ischemic attack (TIA), and cerebral infarction without residual deficits: Secondary | ICD-10-CM

## 2023-08-29 DIAGNOSIS — F4310 Post-traumatic stress disorder, unspecified: Secondary | ICD-10-CM | POA: Diagnosis not present

## 2023-08-29 DIAGNOSIS — Z841 Family history of disorders of kidney and ureter: Secondary | ICD-10-CM

## 2023-08-29 DIAGNOSIS — Z87891 Personal history of nicotine dependence: Secondary | ICD-10-CM

## 2023-08-29 DIAGNOSIS — K660 Peritoneal adhesions (postprocedural) (postinfection): Secondary | ICD-10-CM | POA: Diagnosis present

## 2023-08-29 DIAGNOSIS — Z8261 Family history of arthritis: Secondary | ICD-10-CM

## 2023-08-29 DIAGNOSIS — Z803 Family history of malignant neoplasm of breast: Secondary | ICD-10-CM

## 2023-08-29 DIAGNOSIS — Z1152 Encounter for screening for COVID-19: Secondary | ICD-10-CM | POA: Diagnosis not present

## 2023-08-29 DIAGNOSIS — Z818 Family history of other mental and behavioral disorders: Secondary | ICD-10-CM

## 2023-08-29 DIAGNOSIS — Z801 Family history of malignant neoplasm of trachea, bronchus and lung: Secondary | ICD-10-CM

## 2023-08-29 DIAGNOSIS — Z8269 Family history of other diseases of the musculoskeletal system and connective tissue: Secondary | ICD-10-CM

## 2023-08-29 DIAGNOSIS — K760 Fatty (change of) liver, not elsewhere classified: Secondary | ICD-10-CM | POA: Diagnosis not present

## 2023-08-29 DIAGNOSIS — I7300 Raynaud's syndrome without gangrene: Secondary | ICD-10-CM | POA: Diagnosis present

## 2023-08-29 DIAGNOSIS — K219 Gastro-esophageal reflux disease without esophagitis: Secondary | ICD-10-CM | POA: Diagnosis present

## 2023-08-29 DIAGNOSIS — Z79899 Other long term (current) drug therapy: Secondary | ICD-10-CM

## 2023-08-29 DIAGNOSIS — F321 Major depressive disorder, single episode, moderate: Secondary | ICD-10-CM | POA: Diagnosis present

## 2023-08-29 DIAGNOSIS — R1013 Epigastric pain: Secondary | ICD-10-CM

## 2023-08-29 DIAGNOSIS — Z888 Allergy status to other drugs, medicaments and biological substances status: Secondary | ICD-10-CM

## 2023-08-29 DIAGNOSIS — K851 Biliary acute pancreatitis without necrosis or infection: Principal | ICD-10-CM | POA: Diagnosis present

## 2023-08-29 DIAGNOSIS — Z813 Family history of other psychoactive substance abuse and dependence: Secondary | ICD-10-CM

## 2023-08-29 DIAGNOSIS — F419 Anxiety disorder, unspecified: Secondary | ICD-10-CM | POA: Diagnosis present

## 2023-08-29 LAB — COMPREHENSIVE METABOLIC PANEL WITH GFR
ALT: 324 U/L — ABNORMAL HIGH (ref 0–44)
AST: 787 U/L — ABNORMAL HIGH (ref 15–41)
Albumin: 3.3 g/dL — ABNORMAL LOW (ref 3.5–5.0)
Alkaline Phosphatase: 81 U/L (ref 38–126)
Anion gap: 10 (ref 5–15)
BUN: 12 mg/dL (ref 6–20)
CO2: 28 mmol/L (ref 22–32)
Calcium: 8.2 mg/dL — ABNORMAL LOW (ref 8.9–10.3)
Chloride: 103 mmol/L (ref 98–111)
Creatinine, Ser: 0.79 mg/dL (ref 0.44–1.00)
GFR, Estimated: >60 mL/min (ref >60)
Glucose, Bld: 137 mg/dL — ABNORMAL HIGH (ref 70–99)
Potassium: 4.1 mmol/L (ref 3.5–5.1)
Sodium: 141 mmol/L (ref 135–145)
Total Bilirubin: 1.3 mg/dL — ABNORMAL HIGH (ref 0.0–1.2)
Total Protein: 5.8 g/dL — ABNORMAL LOW (ref 6.5–8.1)

## 2023-08-29 LAB — SURGICAL PCR SCREEN
MRSA, PCR: NEGATIVE
Staphylococcus aureus: NEGATIVE

## 2023-08-29 LAB — RESP PANEL BY RT-PCR (RSV, FLU A&B, COVID)  RVPGX2
Influenza A by PCR: NEGATIVE
Influenza B by PCR: NEGATIVE
Resp Syncytial Virus by PCR: NEGATIVE
SARS Coronavirus 2 by RT PCR: NEGATIVE

## 2023-08-29 LAB — CBC WITH DIFFERENTIAL/PLATELET
Abs Immature Granulocytes: 0.03 K/uL (ref 0.00–0.07)
Basophils Absolute: 0 K/uL (ref 0.0–0.1)
Basophils Relative: 0 %
Eosinophils Absolute: 0 K/uL (ref 0.0–0.5)
Eosinophils Relative: 0 %
HCT: 37.4 % (ref 36.0–46.0)
Hemoglobin: 12.3 g/dL (ref 12.0–15.0)
Immature Granulocytes: 0 %
Lymphocytes Relative: 9 %
Lymphs Abs: 0.7 K/uL (ref 0.7–4.0)
MCH: 32 pg (ref 26.0–34.0)
MCHC: 32.9 g/dL (ref 30.0–36.0)
MCV: 97.4 fL (ref 80.0–100.0)
Monocytes Absolute: 0 K/uL — ABNORMAL LOW (ref 0.1–1.0)
Monocytes Relative: 1 %
Neutro Abs: 6.6 K/uL (ref 1.7–7.7)
Neutrophils Relative %: 90 %
Platelets: 236 K/uL (ref 150–400)
RBC: 3.84 MIL/uL — ABNORMAL LOW (ref 3.87–5.11)
RDW: 13.3 % (ref 11.5–15.5)
WBC: 7.4 K/uL (ref 4.0–10.5)
nRBC: 0 % (ref 0.0–0.2)

## 2023-08-29 LAB — LIPASE, BLOOD: Lipase: 320 U/L — ABNORMAL HIGH (ref 11–51)

## 2023-08-29 LAB — GLUCOSE, CAPILLARY: Glucose-Capillary: 87 mg/dL (ref 70–99)

## 2023-08-29 MED ORDER — ONDANSETRON HCL 4 MG/2ML IJ SOLN
4.0000 mg | Freq: Four times a day (QID) | INTRAMUSCULAR | Status: DC | PRN
Start: 2023-08-29 — End: 2023-08-31

## 2023-08-29 MED ORDER — FLUTICASONE PROPIONATE 50 MCG/ACT NA SUSP
1.0000 | Freq: Two times a day (BID) | NASAL | Status: DC
  Administered 2023-08-30 – 2023-08-31 (×3): 1 via NASAL
  Filled 2023-08-29: qty 16

## 2023-08-29 MED ORDER — ONDANSETRON HCL 4 MG/2ML IJ SOLN: 4.0000 mg | Freq: Once | INTRAMUSCULAR | Status: DC

## 2023-08-29 MED ORDER — LAMOTRIGINE 100 MG PO TABS
200.0000 mg | ORAL_TABLET | Freq: Every day | ORAL | Status: DC
Start: 2023-08-29 — End: 2023-08-31
  Administered 2023-08-29 – 2023-08-31 (×3): 200 mg via ORAL
  Filled 2023-08-29 (×2): qty 2

## 2023-08-29 MED ORDER — IOHEXOL 300 MG/ML  SOLN
100.0000 mL | Freq: Once | INTRAMUSCULAR | Status: AC | PRN
  Administered 2023-08-29: 100 mL via INTRAVENOUS

## 2023-08-29 MED ORDER — SUMATRIPTAN SUCCINATE 50 MG PO TABS
25.0000 mg | ORAL_TABLET | ORAL | Status: DC | PRN
  Administered 2023-08-29 (×2): 25 mg via ORAL
  Filled 2023-08-29 (×2): qty 1

## 2023-08-29 MED ORDER — ENOXAPARIN SODIUM 40 MG/0.4ML IJ SOSY
40.0000 mg | PREFILLED_SYRINGE | INTRAMUSCULAR | Status: DC
  Administered 2023-08-29 – 2023-08-30 (×2): 40 mg via SUBCUTANEOUS
  Filled 2023-08-29 (×2): qty 0.4

## 2023-08-29 MED ORDER — SCOPOLAMINE 1 MG/3DAYS TD PT72
1.0000 | MEDICATED_PATCH | TRANSDERMAL | Status: DC
  Administered 2023-08-29: 1 mg via TRANSDERMAL
  Filled 2023-08-29: qty 1

## 2023-08-29 MED ORDER — LACTATED RINGERS IV BOLUS
1000.0000 mL | Freq: Once | INTRAVENOUS | Status: AC
  Administered 2023-08-29: 1000 mL via INTRAVENOUS

## 2023-08-29 MED ORDER — PRAZOSIN HCL 2 MG PO CAPS
2.0000 mg | ORAL_CAPSULE | Freq: Every day | ORAL | Status: DC
  Administered 2023-08-29 – 2023-08-30 (×2): 2 mg via ORAL
  Filled 2023-08-29 (×2): qty 1

## 2023-08-29 MED ORDER — BUSPIRONE HCL 15 MG PO TABS
15.0000 mg | ORAL_TABLET | Freq: Three times a day (TID) | ORAL | Status: DC
  Administered 2023-08-29 – 2023-08-31 (×7): 15 mg via ORAL
  Filled 2023-08-29: qty 3
  Filled 2023-08-29 (×2): qty 1
  Filled 2023-08-29 (×4): qty 3

## 2023-08-29 MED ORDER — MECLIZINE HCL 12.5 MG PO TABS: 25.0000 mg | ORAL_TABLET | Freq: Three times a day (TID) | ORAL | Status: DC | PRN

## 2023-08-29 MED ORDER — ONDANSETRON HCL 4 MG PO TABS: 4.0000 mg | ORAL_TABLET | Freq: Four times a day (QID) | ORAL | Status: DC | PRN

## 2023-08-29 MED ORDER — ACETAMINOPHEN 325 MG PO TABS
650.0000 mg | ORAL_TABLET | Freq: Four times a day (QID) | ORAL | Status: DC | PRN
  Administered 2023-08-29: 650 mg via ORAL
  Filled 2023-08-29: qty 2

## 2023-08-29 MED ORDER — LACTATED RINGERS IV SOLN: INTRAVENOUS | Status: AC

## 2023-08-29 MED ORDER — MUPIROCIN 2 % EX OINT
1.0000 | TOPICAL_OINTMENT | Freq: Two times a day (BID) | CUTANEOUS | Status: DC
  Administered 2023-08-29 – 2023-08-31 (×3): 1 via NASAL
  Filled 2023-08-29: qty 22

## 2023-08-29 MED ORDER — MORPHINE SULFATE (PF) 2 MG/ML IV SOLN: 2.0000 mg | INTRAVENOUS | Status: DC | PRN

## 2023-08-29 MED ORDER — PANTOPRAZOLE SODIUM 40 MG PO TBEC
40.0000 mg | DELAYED_RELEASE_TABLET | Freq: Two times a day (BID) | ORAL | Status: DC
  Administered 2023-08-29 – 2023-08-31 (×4): 40 mg via ORAL
  Filled 2023-08-29 (×4): qty 1

## 2023-08-29 MED ORDER — ONDANSETRON HCL 4 MG/2ML IJ SOLN
4.0000 mg | Freq: Once | INTRAMUSCULAR | Status: AC
  Administered 2023-08-29: 4 mg via INTRAVENOUS
  Filled 2023-08-29: qty 2

## 2023-08-29 MED ORDER — SODIUM CHLORIDE 0.9 % IV BOLUS
1000.0000 mL | Freq: Once | INTRAVENOUS | Status: AC
  Administered 2023-08-29: 1000 mL via INTRAVENOUS

## 2023-08-29 MED ORDER — MORPHINE SULFATE (PF) 4 MG/ML IV SOLN
4.0000 mg | Freq: Once | INTRAVENOUS | Status: AC
  Administered 2023-08-29: 4 mg via INTRAVENOUS
  Filled 2023-08-29: qty 1

## 2023-08-29 MED ORDER — LEVETIRACETAM 500 MG PO TABS
750.0000 mg | ORAL_TABLET | Freq: Two times a day (BID) | ORAL | Status: DC
  Administered 2023-08-29 – 2023-08-31 (×5): 750 mg via ORAL
  Filled 2023-08-29 (×5): qty 1

## 2023-08-29 NOTE — ED Provider Notes (Signed)
 [place] [facility] AT [facility]  Provider Note  CSN: [id number] Arrival date & time: 08/29/23 9685  History Chief Complaint  Patient presents with   Abdominal Pain    Brooke [name] is a 53 y.o. female with history of roux-en-y gastric bypass surgery here for several hours of epigastric pain, radiating around RUQ and vomiting. Had some chills at home. No URI symptoms but coworker recently had covid. Some diarrhea.    Home Medications Prior to Admission medications   Medication Sig Start Date End Date Taking? Authorizing Provider  acetaminophen  (TYLENOL ) 500 MG tablet Take 1,000 mg by mouth daily as needed for moderate pain or headache.    [provider]  Biotin 5000 MCG TABS Take 5,000 mcg by mouth daily.    [provider]  busPIRone  (BUSPAR ) 30 MG tablet Take 1 tablet (30 mg total) by mouth 2 (two) times daily. 08/01/23 10/30/23  Vickey Mettle, MD  Dulaglutide  (TRULICITY ) 1.5 MG/0.5ML SOAJ Inject 1.5 mg into the skin once a week. 07/01/23   Verdon Parry D, MD  fluticasone  (FLONASE ) 50 MCG/ACT nasal spray Place 2 sprays into both nostrils daily. 12/08/22   Leath-Warren, Etta PARAS, NP  fluticasone  (FLONASE ) 50 MCG/ACT nasal spray Place 1 spray into both nostrils 2 (two) times daily. Patient not taking: Reported on 07/01/2023 02/08/23   Stuart Vernell Norris, PA-C  furosemide  (LASIX ) 20 MG tablet Take 1 tablet (20 mg total) by mouth every other day. 05/29/23   Del Wilhelmena Lloyd Sola, FNP  lamoTRIgine  (LAMICTAL ) 100 MG tablet Take 2 tablets (200 mg total) by mouth daily. 08/13/23 11/18/23  Vickey Mettle, MD  levETIRAcetam  (KEPPRA ) 750 MG tablet Take 1 tablet (750 mg total) by mouth 2 (two) times daily. 08/19/23   Millikan, Megan, NP  meclizine  (ANTIVERT ) 25 MG tablet TAKE 1 TABLET(25 MG) BY MOUTH THREE TIMES DAILY AS NEEDED FOR DIZZINESS 04/10/21   Patel, Rutwik K, MD  Multiple Vitamins-Minerals (BARIATRIC MULTIVITAMINS/IRON PO) Take 1 tablet by  mouth daily.     [provider]  ondansetron  (ZOFRAN -ODT) 4 MG disintegrating tablet Take 1 tablet (4 mg total) by mouth every 8 (eight) hours as needed for vomiting or nausea. 01/28/23   Chandra Harlene LABOR, NP  pantoprazole  (PROTONIX ) 40 MG tablet Take 1 tablet (40 mg total) by mouth 2 (two) times daily before a meal. 08/26/23   Rourk, Lamar CHRISTELLA, MD  prazosin  (MINIPRESS ) 2 MG capsule Take 1 capsule (2 mg total) by mouth at bedtime. 08/19/23 11/25/23  Vickey Mettle, MD  predniSONE  (STERAPRED UNI-PAK 21 TAB) 10 MG (21) TBPK tablet Take by mouth as directed on pack. 06/28/23     scopolamine  (TRANSDERM-SCOP) 1 MG/3DAYS Place 1 patch (1.5 mg total) onto the skin every 3 (three) days. 07/23/22   Tobie Suzzane POUR, MD  SUMAtriptan  (IMITREX ) 50 MG tablet TAKE 1 TABLET BY MOUTH EVERY 2 HOURS AS NEEDED FOR MIGRAINE. MAY REPEAT IN 2 HOURS IF HEADACHE PERSISTS OR RECURS 12/12/20   Tobie Suzzane POUR, MD     Allergies    Progesterone    Review of Systems   Review of Systems Please see HPI for pertinent positives and negatives  Physical Exam BP 117/69   Pulse 98   Temp 98.5 F (36.9 C) (Oral)   Resp 16   Ht 5' 2 (1.575 m)   Wt 58.5 kg   LMP 09/01/2020 (Approximate)   SpO2 98%   BMI 23.59 kg/m   Physical Exam Vitals and nursing note reviewed.  Constitutional:      Appearance: Normal appearance.  HENT:     Head: Normocephalic and atraumatic.     Nose: Nose normal.     Mouth/Throat:     Mouth: Mucous membranes are moist.  Eyes:     Extraocular Movements: Extraocular movements intact.     Conjunctiva/sclera: Conjunctivae normal.  Cardiovascular:     Rate and Rhythm: Normal rate.  Pulmonary:     Effort: Pulmonary effort is normal.     Breath sounds: Normal breath sounds.  Abdominal:     General: Abdomen is flat.     Palpations: Abdomen is soft.     Tenderness: There is generalized abdominal tenderness. There is no guarding. Negative signs include Murphy's sign and McBurney's sign.   Musculoskeletal:        General: No swelling. Normal range of motion.     Cervical back: Neck supple.  Skin:    General: Skin is warm and dry.  Neurological:     General: No focal deficit present.     Mental Status: She is alert.  Psychiatric:        Mood and Affect: Mood normal.     ED Results / Procedures / Treatments   EKG None  Procedures Procedures  Medications Ordered in the ED Medications  morphine  (PF) 4 MG/ML injection 4 mg (4 mg Intravenous Given 08/29/23 0352)  ondansetron  (ZOFRAN ) injection 4 mg (4 mg Intravenous Given 08/29/23 0352)  sodium chloride  0.9 % bolus 1,000 mL (1,000 mLs Intravenous Bolus 08/29/23 0351)  iohexol  (OMNIPAQUE ) 300 MG/ML solution 100 mL (100 mLs Intravenous Contrast Given 08/29/23 0420)    Initial Impression and Plan  Patient here with abdominal pain, nausea/vomiting and diarrhea. Will check labs, pain/nausea meds for comfort. Send for CT.   ED Course   Clinical Course as of 08/29/23 0702  Thu Aug 29, 2023  0357 CBC is unremarkable.  [CS]  [phone] CMP with elevated LFTs including bilirubin and lipase is also moderately elevated. [CS]  0451 I personally viewed the images from radiology studies and agree with radiologist interpretation: CT with gall stones and mild gall bladder wall thickening. She reports pain is improved, will send for US  when they arrive in AM to get a more thorough look at [facility] and [facility].  [CS]  0701 Care of the patient signed out at shift change pending US .  [CS]    Clinical Course User Index [CS] Roselyn Carlin NOVAK, MD     MDM Rules/Calculators/A&P Medical Decision Making Problems Addressed: Elevated liver enzymes: acute illness or injury Epigastric pain: acute illness or injury  Amount and/or Complexity of Data Reviewed Labs: ordered. Decision-making details documented in ED Course. Radiology: ordered and independent interpretation performed. Decision-making details documented in ED Course.  Risk Prescription  drug management. Parenteral controlled substances.     Final Clinical Impression(s) / ED Diagnoses Final diagnoses:  Epigastric pain  Elevated liver enzymes    Rx / DC Orders ED Discharge Orders     None        Roselyn Carlin NOVAK, MD 08/29/23 [phone]
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
## 2023-08-29 NOTE — ED Triage Notes (Signed)
 Pov from home. Cc of abdominal pain with chills that started at 11pm. Says it is mid abdomen that wraps around to the right side of her back. Emesis x1. Diarrhea x2  Stabbing 8/10.

## 2023-08-29 NOTE — ED Notes (Signed)
 Patient transported to CT

## 2023-08-29 NOTE — Progress Notes (Signed)
   08/29/23 1140  TOC Brief Assessment  Insurance and Status Reviewed  Patient has primary care physician Yes  Home environment has been reviewed Home w/ spouse  Prior level of function: Independent  Prior/Current Home Services No current home services  Social Drivers of Health Review SDOH reviewed no interventions necessary  Readmission risk has been reviewed Yes  Transition of care needs no transition of care needs at this time

## 2023-08-29 NOTE — ED Provider Notes (Signed)
   ED Course / MDM   Clinical Course as of 08/29/23 1107  Thu Aug 29, 2023  0357 CBC is unremarkable.  [CS]  [phone] CMP with elevated LFTs including bilirubin and lipase is also moderately elevated. [CS]  0451 I personally viewed the images from radiology studies and agree with radiologist interpretation: CT with gall stones and mild gall bladder wall thickening. She reports pain is improved, will send for US  when they arrive in AM to get a more thorough look at [facility] and [facility].  [CS]  0701 Care of the patient signed out at shift change pending US .  [CS]  0708 Received sign out from Dr. Roselyn pending RUQ US . Abdominal pain [WS]  1105 Us  shows gallstones, borderline thickening. Lower concern for cholecystitis given no fevers, ttp more in epigastrum. Discussed with Dr. Evonnie with surgery who will consult. Discussed with Dr. Maree who will admit [WS]    Clinical Course User Index [CS] Roselyn Carlin NOVAK, MD [WS] Francesca Elsie CROME, MD   Medical Decision Making Amount and/or Complexity of Data Reviewed Labs: ordered. Radiology: ordered.  Risk Prescription drug management. Decision regarding hospitalization.         Francesca Elsie CROME, MD 08/29/23 [phone]
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
## 2023-08-29 NOTE — Plan of Care (Signed)

## 2023-08-30 ENCOUNTER — Inpatient Hospital Stay (HOSPITAL_COMMUNITY)

## 2023-08-30 ENCOUNTER — Inpatient Hospital Stay (HOSPITAL_COMMUNITY): Admitting: Anesthesiology

## 2023-08-30 ENCOUNTER — Encounter (HOSPITAL_COMMUNITY): Payer: Self-pay | Admitting: Internal Medicine

## 2023-08-30 ENCOUNTER — Encounter (HOSPITAL_COMMUNITY)
Admission: EM | Disposition: A | Discharge status: Home / Self Care | Payer: Self-pay | Source: Home / Self Care | Attending: Internal Medicine

## 2023-08-30 DIAGNOSIS — K828 Other specified diseases of gallbladder: Secondary | ICD-10-CM | POA: Diagnosis not present

## 2023-08-30 DIAGNOSIS — K851 Biliary acute pancreatitis without necrosis or infection: Secondary | ICD-10-CM | POA: Diagnosis not present

## 2023-08-30 LAB — CBC
HCT: 34.5 % — ABNORMAL LOW (ref 36.0–46.0)
Hemoglobin: 11.4 g/dL — ABNORMAL LOW (ref 12.0–15.0)
MCH: 31.8 pg (ref 26.0–34.0)
MCHC: 33 g/dL (ref 30.0–36.0)
MCV: 96.1 fL (ref 80.0–100.0)
Platelets: 194 K/uL (ref 150–400)
RBC: 3.59 MIL/uL — ABNORMAL LOW (ref 3.87–5.11)
RDW: 13.7 % (ref 11.5–15.5)
WBC: 8.4 K/uL (ref 4.0–10.5)
nRBC: 0 % (ref 0.0–0.2)

## 2023-08-30 LAB — COMPREHENSIVE METABOLIC PANEL WITH GFR
ALT: 863 U/L — ABNORMAL HIGH (ref 0–44)
AST: 748 U/L — ABNORMAL HIGH (ref 15–41)
Albumin: 2.7 g/dL — ABNORMAL LOW (ref 3.5–5.0)
Alkaline Phosphatase: 100 U/L (ref 38–126)
Anion gap: 9 (ref 5–15)
BUN: 9 mg/dL (ref 6–20)
CO2: 23 mmol/L (ref 22–32)
Calcium: 7.6 mg/dL — ABNORMAL LOW (ref 8.9–10.3)
Chloride: 107 mmol/L (ref 98–111)
Creatinine, Ser: 0.73 mg/dL (ref 0.44–1.00)
GFR, Estimated: >60 mL/min (ref >60)
Glucose, Bld: 75 mg/dL (ref 70–99)
Potassium: 3.5 mmol/L (ref 3.5–5.1)
Sodium: 139 mmol/L (ref 135–145)
Total Bilirubin: 2.7 mg/dL — ABNORMAL HIGH (ref 0.0–1.2)
Total Protein: 5 g/dL — ABNORMAL LOW (ref 6.5–8.1)

## 2023-08-30 LAB — HIV ANTIBODY (ROUTINE TESTING W REFLEX): HIV Screen 4th Generation wRfx: NONREACTIVE

## 2023-08-30 LAB — GLUCOSE, CAPILLARY: Glucose-Capillary: 80 mg/dL (ref 70–99)

## 2023-08-30 LAB — LIPASE, BLOOD: Lipase: 21 U/L (ref 11–51)

## 2023-08-30 LAB — MAGNESIUM: Magnesium: 1.8 mg/dL (ref 1.7–2.4)

## 2023-08-30 SURGERY — CHOLECYSTECTOMY, ROBOT-ASSISTED, LAPAROSCOPIC
Anesthesia: General | Site: Abdomen

## 2023-08-30 MED ORDER — PHENYLEPHRINE HCL (PRESSORS) 10 MG/ML IV SOLN
INTRAVENOUS | Status: DC | PRN
  Administered 2023-08-30 (×3): 100 ug via INTRAVENOUS

## 2023-08-30 MED ORDER — LACTATED RINGERS IV SOLN: INTRAVENOUS | Status: DC

## 2023-08-30 MED ORDER — MIDAZOLAM HCL 2 MG/2ML IJ SOLN
INTRAMUSCULAR | Status: AC
  Filled 2023-08-30: qty 2

## 2023-08-30 MED ORDER — SODIUM CHLORIDE 0.9 % IV SOLN
2.0000 g | INTRAVENOUS | Status: AC
  Administered 2023-08-30: 2 g via INTRAVENOUS
  Filled 2023-08-30: qty 2

## 2023-08-30 MED ORDER — VASOPRESSIN 20 UNIT/ML IV SOLN
INTRAVENOUS | Status: AC
  Filled 2023-08-30: qty 1

## 2023-08-30 MED ORDER — INDOCYANINE GREEN 25 MG IV SOLR
2.5000 mg | Freq: Once | INTRAVENOUS | Status: AC
  Filled 2023-08-30: qty 10

## 2023-08-30 MED ORDER — PROPOFOL 10 MG/ML IV BOLUS
INTRAVENOUS | Status: AC
  Filled 2023-08-30: qty 20

## 2023-08-30 MED ORDER — PHENYLEPHRINE 80 MCG/ML (10ML) SYRINGE FOR IV PUSH (FOR BLOOD PRESSURE SUPPORT)
PREFILLED_SYRINGE | INTRAVENOUS | Status: AC
Start: 2023-08-30 — End: 2023-08-30
  Filled 2023-08-30: qty 10

## 2023-08-30 MED ORDER — CHLORHEXIDINE GLUCONATE 0.12 % MT SOLN
15.0000 mL | Freq: Once | OROMUCOSAL | Status: AC
  Administered 2023-08-30: 15 mL via OROMUCOSAL

## 2023-08-30 MED ORDER — CHLORHEXIDINE GLUCONATE 0.12 % MT SOLN: 15.0000 mL | Freq: Once | OROMUCOSAL | Status: DC

## 2023-08-30 MED ORDER — OXYCODONE HCL 5 MG/5ML PO SOLN: 5.0000 mg | Freq: Once | ORAL | Status: DC | PRN

## 2023-08-30 MED ORDER — FENTANYL CITRATE (PF) 250 MCG/5ML IJ SOLN
INTRAMUSCULAR | Status: AC
  Filled 2023-08-30: qty 5

## 2023-08-30 MED ORDER — ONDANSETRON HCL 4 MG/2ML IJ SOLN
INTRAMUSCULAR | Status: DC | PRN
  Administered 2023-08-30: 4 mg via INTRAVENOUS

## 2023-08-30 MED ORDER — SODIUM CHLORIDE 0.9 % IV SOLN
INTRAVENOUS | Status: AC
  Filled 2023-08-30: qty 2

## 2023-08-30 MED ORDER — ROCURONIUM BROMIDE 10 MG/ML (PF) SYRINGE
PREFILLED_SYRINGE | INTRAVENOUS | Status: AC
Start: 2023-08-30 — End: 2023-08-30
  Filled 2023-08-30: qty 10

## 2023-08-30 MED ORDER — ACETAMINOPHEN 325 MG PO TABS
650.0000 mg | ORAL_TABLET | Freq: Four times a day (QID) | ORAL | Status: DC | PRN
  Administered 2023-08-30: 650 mg via ORAL
  Filled 2023-08-30: qty 2

## 2023-08-30 MED ORDER — SUCCINYLCHOLINE CHLORIDE 20 MG/ML IJ SOLN
INTRAMUSCULAR | Status: DC | PRN
  Administered 2023-08-30: 120 mg via INTRAVENOUS

## 2023-08-30 MED ORDER — GLYCOPYRROLATE PF 0.2 MG/ML IJ SOSY
PREFILLED_SYRINGE | INTRAMUSCULAR | Status: DC | PRN
  Administered 2023-08-30: 200 mg via INTRAVENOUS

## 2023-08-30 MED ORDER — OXYCODONE HCL 5 MG PO TABS
5.0000 mg | ORAL_TABLET | ORAL | Status: DC | PRN
  Administered 2023-08-30 – 2023-08-31 (×2): 5 mg via ORAL
  Filled 2023-08-30 (×2): qty 1

## 2023-08-30 MED ORDER — CHLORHEXIDINE GLUCONATE CLOTH 2 % EX PADS: 6.0000 | MEDICATED_PAD | Freq: Once | CUTANEOUS | Status: DC

## 2023-08-30 MED ORDER — MIDAZOLAM HCL 5 MG/5ML IJ SOLN
INTRAMUSCULAR | Status: DC | PRN
  Administered 2023-08-30: 2 mg via INTRAVENOUS

## 2023-08-30 MED ORDER — LIDOCAINE 2% (20 MG/ML) 5 ML SYRINGE
INTRAMUSCULAR | Status: AC
  Filled 2023-08-30: qty 5

## 2023-08-30 MED ORDER — ONDANSETRON HCL 4 MG/2ML IJ SOLN: 4.0000 mg | Freq: Once | INTRAMUSCULAR | Status: DC | PRN

## 2023-08-30 MED ORDER — ONDANSETRON HCL 4 MG/2ML IJ SOLN
INTRAMUSCULAR | Status: AC
Start: 2023-08-30 — End: 2023-08-30
  Filled 2023-08-30: qty 2

## 2023-08-30 MED ORDER — FENTANYL CITRATE PF 50 MCG/ML IJ SOSY: 25.0000 ug | PREFILLED_SYRINGE | INTRAMUSCULAR | Status: DC | PRN

## 2023-08-30 MED ORDER — ROCURONIUM BROMIDE 100 MG/10ML IV SOLN
INTRAVENOUS | Status: DC | PRN
  Administered 2023-08-30: 70 mg via INTRAVENOUS

## 2023-08-30 MED ORDER — STERILE WATER FOR IRRIGATION IR SOLN
Status: DC | PRN
  Administered 2023-08-30: 500 mL

## 2023-08-30 MED ORDER — OXYCODONE HCL 5 MG PO TABS: 5.0000 mg | ORAL_TABLET | Freq: Once | ORAL | Status: DC | PRN

## 2023-08-30 MED ORDER — LIDOCAINE HCL (CARDIAC) PF 100 MG/5ML IV SOSY
PREFILLED_SYRINGE | INTRAVENOUS | Status: DC | PRN
  Administered 2023-08-30: 100 mg via INTRAVENOUS

## 2023-08-30 MED ORDER — BUPIVACAINE HCL (PF) 0.5 % IJ SOLN
INTRAMUSCULAR | Status: DC | PRN
Start: 2023-08-30 — End: 2023-08-30
  Administered 2023-08-30: 30 mL

## 2023-08-30 MED ORDER — FENTANYL CITRATE (PF) 100 MCG/2ML IJ SOLN
INTRAMUSCULAR | Status: DC | PRN
  Administered 2023-08-30 (×3): 50 ug via INTRAVENOUS
  Administered 2023-08-30: 100 ug via INTRAVENOUS

## 2023-08-30 MED ORDER — BUPIVACAINE HCL (PF) 0.5 % IJ SOLN
INTRAMUSCULAR | Status: AC
  Filled 2023-08-30: qty 30

## 2023-08-30 MED ORDER — GADOBUTROL 1 MMOL/ML IV SOLN
6.0000 mL | Freq: Once | INTRAVENOUS | Status: AC | PRN
Start: 2023-08-30 — End: 2023-08-30
  Administered 2023-08-30: 6 mL via INTRAVENOUS

## 2023-08-30 MED ORDER — VASOPRESSIN 20 UNIT/ML IV SOLN
INTRAVENOUS | Status: DC | PRN
Start: 2023-08-30 — End: 2023-08-30
  Administered 2023-08-30: 1 [IU] via INTRAVENOUS

## 2023-08-30 MED ORDER — PROPOFOL 10 MG/ML IV BOLUS
INTRAVENOUS | Status: DC | PRN
  Administered 2023-08-30: 200 mg via INTRAVENOUS

## 2023-08-30 MED ORDER — INDOCYANINE GREEN 25 MG IV SOLR
INTRAVENOUS | Status: AC
  Administered 2023-08-30: 2.5 mg via INTRAVENOUS
  Filled 2023-08-30: qty 10

## 2023-08-30 MED ORDER — SUGAMMADEX SODIUM 200 MG/2ML IV SOLN
INTRAVENOUS | Status: AC
  Filled 2023-08-30: qty 2

## 2023-08-30 MED ORDER — ORAL CARE MOUTH RINSE: 15.0000 mL | Freq: Once | OROMUCOSAL | Status: DC

## 2023-08-30 MED ORDER — DEXAMETHASONE SODIUM PHOSPHATE 4 MG/ML IJ SOLN
INTRAMUSCULAR | Status: DC | PRN
  Administered 2023-08-30: 5 mg via INTRAVENOUS

## 2023-08-30 SURGICAL SUPPLY — 37 items
BLADE SURG 15 STRL LF DISP TIS (BLADE) ×1 IMPLANT
CAUTERY HOOK MNPLR 1.6 DVNC XI (INSTRUMENTS) ×1 IMPLANT
CHLORAPREP W/TINT 26 (MISCELLANEOUS) ×1 IMPLANT
CLIP LIGATING HEM O LOK PURPLE (MISCELLANEOUS) ×1 IMPLANT
COVER LIGHT HANDLE (MISCELLANEOUS) IMPLANT
DEFOGGER SCOPE WARM SEASHARP (MISCELLANEOUS) ×1 IMPLANT
DERMABOND ADVANCED .7 DNX12 (GAUZE/BANDAGES/DRESSINGS) ×1 IMPLANT
DRAPE ARM DVNC X/XI (DISPOSABLE) ×4 IMPLANT
DRAPE COLUMN DVNC XI (DISPOSABLE) ×1 IMPLANT
ELECTRODE REM PT RTRN 9FT ADLT (ELECTROSURGICAL) ×1 IMPLANT
FORCEPS BPLR R/ABLATION 8 DVNC (INSTRUMENTS) ×1 IMPLANT
FORCEPS PROGRASP DVNC XI (FORCEP) ×1 IMPLANT
GLOVE BIOGEL PI IND STRL 6.5 (GLOVE) ×2 IMPLANT
GLOVE BIOGEL PI IND STRL 7.0 (GLOVE) ×3 IMPLANT
GLOVE SURG SS PI 6.5 STRL IVOR (GLOVE) ×2 IMPLANT
GOWN STRL REUS W/TWL LRG LVL3 (GOWN DISPOSABLE) ×3 IMPLANT
GRASPER SUT TROCAR 14GX15 (MISCELLANEOUS) ×1 IMPLANT
KIT TURNOVER KIT A (KITS) ×1 IMPLANT
MANIFOLD NEPTUNE II (INSTRUMENTS) ×1 IMPLANT
NDL HYPO 21X1.5 SAFETY (NEEDLE) ×1 IMPLANT
NDL INSUFFLATION 14GA 120MM (NEEDLE) ×1 IMPLANT
NEEDLE HYPO 21X1.5 SAFETY (NEEDLE) ×1 IMPLANT
NEEDLE INSUFFLATION 14GA 120MM (NEEDLE) ×1 IMPLANT
OBTURATOR OPTICALSTD 8 DVNC (TROCAR) ×1 IMPLANT
PACK LAP CHOLE LZT030E (CUSTOM PROCEDURE TRAY) ×1 IMPLANT
PAD ARMBOARD POSITIONER FOAM (MISCELLANEOUS) ×1 IMPLANT
PENCIL HANDSWITCHING (ELECTRODE) ×1 IMPLANT
POSITIONER HEAD 8X9X4 ADT (SOFTGOODS) ×1 IMPLANT
SEAL UNIV 5-12 XI (MISCELLANEOUS) ×4 IMPLANT
SET BASIN LINEN APH (SET/KITS/TRAYS/PACK) ×1 IMPLANT
SET TUBE SMOKE EVAC HIGH FLOW (TUBING) ×1 IMPLANT
SUT MNCRL AB 4-0 PS2 18 (SUTURE) ×2 IMPLANT
SUT VICRYL 0 UR6 27IN ABS (SUTURE) ×1 IMPLANT
SYR 30ML LL (SYRINGE) ×1 IMPLANT
SYSTEM RETRIEVL 5MM INZII UNIV (BASKET) ×1 IMPLANT
TUBE CONNECTING 12X1/4 (SUCTIONS) IMPLANT
WATER STERILE IRR 500ML POUR (IV SOLUTION) ×1 IMPLANT

## 2023-08-30 NOTE — Op Note (Signed)
 [facility] Operative Note  08/30/23  Preoperative Diagnosis: Gallstone pancreatitis   Postoperative Diagnosis: Same   Procedure(s) Performed: Robotic Assisted Laparoscopic Cholecystectomy   Surgeon: Dorothyann Brittle, DO   Assistants: No qualified resident was available    Anesthesia: General endotracheal   Anesthesiologist: Dr. Kendell   Specimens: Gallbladder   Estimated Blood Loss: Minimal   Blood Replacement: None    Complications: None   Wound Class: Clean contaminated   Operative Indications: The patient presented to the hospital with a 1 day history of worsening epigastric abdominal pain.  She was noted to have pancreatitis and gallstones.  She had an increase in her total bilirubin to 2.7, so underwent MRCP today which demonstrated no evidence of choledocholithiasis today.  Decision was made to proceed to the OR for cholecystectomy.  We discussed the risk of the procedure including but not limited to bleeding, infection, injury to the common bile duct, bile leak, need for further procedures, chance of subtotal cholecystectomy.   Findings:  Edematous gallbladder without significant inflammatory changes, likely secondary to acute pancreatitis Critical view of safety noted All clips intact at the end of the case Adequate hemostasis   Procedure: Firefly was given in the preoperative area. The patient was taken to the operating room and placed supine. General endotracheal anesthesia was induced. Intravenous antibiotics were administered per protocol. The abdomen was prepared and draped in the usual sterile fashion. A time-out was completed verifying correct patient, procedure, site, positioning, and implant(s) and/or special equipment prior to beginning this procedure.  Veress needle was placed at the infraumbilical area and insufflation was started after confirming a positive saline drop test and no immediate increase in abdominal pressure.  After  reaching 15 mm, the Veress needle was removed and a 8 mm port was placed via optiview technique infraumbilical, measuring 20 mm away from the suspected position of the gallbladder.  The abdomen was inspected and no abnormalities or injuries were found.  Under direct vision, ports were placed in the following locations in a semi curvilinear position around the target of the gallbladder: Two 8 mm ports on the patient's right each having 8cm clearance to the adjacent ports and one 8 mm port placed on the patient's left 8 cm from the umbilical port. Once ports were placed, the table was placed in the reverse Trendelenburg position with the right side up. The Xi platform was brought into the operative field and docked to the ports successfully.  An endoscope was placed through the umbilical port, prograsp through the most lateral right port, fenestrated bipolar to the port just right of the umbilicus, and then a hook cautery in the left port.  The dome of the gallbladder was grasped with prograsp and retracted over the dome of the liver. The gallbladder was noted to be edematous, likely related to her pancreatitis.  Adhesions between the gallbladder and omentum, duodenum and transverse colon were lysed via hook cautery. The infundibulum was grasped with the fenestrated grasper and retracted toward the right lower quadrant. This maneuver exposed Calot's triangle. Firefly was used throughout the dissection to ensure safe visualization of the cystic duct.  The peritoneum overlying the gallbladder infundibulum was then dissected and the cystic duct and cystic artery identified.  Critical view of safety with the liver bed clearly visible behind the duct and artery with no additional structures noted.  The cystic duct and cystic artery were doubly clipped and divided close to the gallbladder.    The gallbladder was then dissected  from its peritoneal and liver bed attachments by electrocautery. Hemostasis was checked prior  to removing the hook cautery.  The Anton was undocked and moved out of the field.  A 5mm Endo Catch bag was then placed through the umbilical port and the gallbladder was removed.  The gallbladder was passed off the table as a specimen. There was no evidence of bleeding from the gallbladder fossa or cystic artery or leakage of the bile from the cystic duct stump. Serous fluid in the right paracolic gutter was suctioned out.  The umbilical port site closed with a 0 vicryl with a PMI needle.  The abdomen was desufflated and secondary trocars were removed under direct vision. No bleeding was noted. Incisions were localized with marcaine .  All skin incisions were closed with subcuticular sutures of 4-0 monocryl and dermabond.   Final inspection revealed acceptable hemostasis. All counts were correct at the end of the case. The patient was awakened from anesthesia and extubated without complication. The OG tube was removed.  The patient went to the PACU in stable condition.   Dorothyann Brittle, DO [facility] [address] [place], [place] [address] [phone] (office)

## 2023-08-30 NOTE — Anesthesia Preprocedure Evaluation (Signed)
 Anesthesia Evaluation  Patient identified by MRN, date of birth, ID band Patient awake    Reviewed: Allergy & Precautions, H&P , NPO status , Patient's Chart, lab work & pertinent test results, reviewed documented beta blocker date and time   Airway Mallampati: II  TM Distance: >3 FB Neck ROM: full    Dental no notable dental hx.    Pulmonary neg pulmonary ROS, former smoker   Pulmonary exam normal breath sounds clear to auscultation       Cardiovascular Exercise Tolerance: Good hypertension,  Rhythm:regular Rate:Normal     Neuro/Psych  Headaches, Seizures -,  PSYCHIATRIC DISORDERS Anxiety Depression     Neuromuscular disease    GI/Hepatic Neg liver ROS, hiatal hernia,GERD  ,,  Endo/Other  diabetes    Renal/GU negative Renal ROS  negative genitourinary   Musculoskeletal   Abdominal   Peds  Hematology  (+) Blood dyscrasia, anemia   Anesthesia Other Findings   Reproductive/Obstetrics negative OB ROS                              Anesthesia Physical Anesthesia Plan  ASA: 2  Anesthesia Plan: General and General ETT   Post-op Pain Management:    Induction:   PONV Risk Score and Plan: Ondansetron   Airway Management Planned:   Additional Equipment:   Intra-op Plan:   Post-operative Plan:   Informed Consent: I have reviewed the patients History and Physical, chart, labs and discussed the procedure including the risks, benefits and alternatives for the proposed anesthesia with the patient or authorized representative who has indicated his/her understanding and acceptance.     Dental Advisory Given  Plan Discussed with: CRNA  Anesthesia Plan Comments:         Anesthesia Quick Evaluation

## 2023-08-30 NOTE — Progress Notes (Signed)
 Patient off floor to OR

## 2023-08-30 NOTE — Progress Notes (Signed)
 [facility]  Spoke with the patient's husband in the consultation room.  I explained that she tolerated the procedure without difficulty.  She has dissolvable stitches under the skin with overlying skin glue.  This will flake off in 10 to 14 days.  I explained that she will return to her room on the floor with plans for diet advancement.  She will stay overnight for blood work recheck tomorrow morning.  All questions were answered to his expressed satisfaction.  Plan: -Return to bed on the floor -Full liquid diet ordered -May advance diet as tolerated -PRN pain control and antiemetics -IVF per primary team -AM labs to trend LFTs -Appreciate hospitalist recommendations  Dorothyann Brittle, DO [facility] [address] [place], [place] [address] [phone] (office)

## 2023-08-30 NOTE — Discharge Instructions (Signed)
Surgery Discharge Instructions  Activity  You are advised to go directly home from the hospital.  Restrict your activities and rest for a day.  Resume light activity tomorrow. No heavy lifting over 10 lbs or strenuous exercise.  Fluids and Diet Regular diet  Medications  If you have not had a bowel movement in 24 hours, take 2 tablespoons over the counter Milk of mag.             You May resume your blood thinners tomorrow (Aspirin, coumadin, or other).  You are being discharged with prescriptions for Opioid/Narcotic Medications: There are some specific considerations for these medications that you should know. Opioid Meds have risks & benefits. Addiction to these meds is always a concern with prolonged use Take medication only as directed Do not drive while taking narcotic pain medication Do not crush tablets or capsules Do not use a different container than medication was dispensed in Lock the container of medication in a cool, dry place out of reach of children and pets. Opioid medication can cause addiction Do not share with anyone else (this is a felony) Do not store medications for future use. Dispose of them properly.     Disposal:  Find a [employer] household drug take back site near you.  If you can't get to a drug take back site, use the recipe below as a last resort to dispose of expired, unused or unwanted drugs. Disposal  (Do not dispose chemotherapy drugs this way, talk to your prescribing doctor instead.) Step 1: Mix drugs (do not crush) with dirt, kitty litter, or used coffee grounds and add a small amount of water to dissolve any solid medications. Step 2: Seal drugs in plastic bag. Step 3: Place plastic bag in trash. Step 4: Take prescription container and scratch out personal information, then recycle or throw away.  Operative Site  You have a liquid bandage over your incisions, this will begin to flake off in about a week. Ok to English as a second language teacher. Keep wound clean  and dry. No baths or swimming. No lifting more than 10 pounds.  Contact Information: If you have questions or concerns, please call our office, [phone], Monday- Thursday 8AM-5PM and Friday 8AM-12Noon.  If it is after hours or on the weekend, please call [facility] Main Number, [phone], and ask to speak to the surgeon on call for Dr. Robyne Peers at [facility].   SPECIFIC COMPLICATIONS TO WATCH FOR: Inability to urinate Fever over 101? F by mouth Nausea and vomiting lasting longer than 24 hours. Pain not relieved by medication ordered Swelling around the operative site Increased redness, warmth, hardness, around operative area Numbness, tingling, or cold fingers or toes Blood -soaked dressing, (small amounts of oozing may be normal) Increasing and progressive drainage from surgical area or exam site

## 2023-08-30 NOTE — Transfer of Care (Signed)
 Immediate Anesthesia Transfer of Care Note  Patient: Brooke [name]  Procedure(s) Performed: CHOLECYSTECTOMY, ROBOT-ASSISTED, LAPAROSCOPIC (Abdomen)  Patient Location: PACU  Anesthesia Type:General  Level of Consciousness: drowsy, patient cooperative, and responds to stimulation  Airway & Oxygen Therapy: Patient Spontanous Breathing  Post-op Assessment: Report given to RN and Post -op Vital signs reviewed and stable  Post vital signs: Reviewed and stable  Last Vitals:  Vitals Value Taken Time  BP 110/68 08/30/23 11:06  Temp 36.7 C 08/30/23 11:06  Pulse 71 08/30/23 11:08  Resp 19 08/30/23 11:08  SpO2 95 % 08/30/23 11:08  Vitals shown include unfiled device data.  Last Pain:  Vitals:   08/30/23 0902  TempSrc:   PainSc: 0-No pain         Complications: No notable events documented.

## 2023-08-30 NOTE — Progress Notes (Signed)
 [facility] Progress Note  * Day of Surgery *  Subjective: Patient seen and examined.  She is resting comfortably in bed.  She still has a small amount of epigastric abdominal pain, but otherwise is doing well.  Objective: Vital signs in last 24 hours: Temp:  [98.2 F (36.8 C)-100 F (37.8 C)] 100 F (37.8 C) (08/29 0340) Pulse Rate:  [87-114] 96 (08/29 0340) Resp:  [16-20] 18 (08/29 0340) BP: (97-128)/(60-79) 109/60 (08/29 0340) SpO2:  [93 %-100 %] 96 % (08/29 0340) Weight:  [63 kg] 63 kg (08/29 0902) Last BM Date : 08/27/23  Intake/Output from previous day: 08/28 0701 - 08/29 0700 In: 634.5 [I.V.:634.5] Out: -  Intake/Output this shift: No intake/output data recorded.  General appearance: alert, cooperative, and no distress GI: Abdomen soft, nondistended, no percussion tenderness, mild epigastric tenderness to palpation; no rigidity, guarding, rebound tenderness  Lab Results:  Recent Labs    08/29/23 0338 08/30/23 0337  WBC 7.4 8.4  HGB 12.3 11.4*  HCT 37.4 34.5*  PLT 236 194   BMET Recent Labs    08/29/23 0338 08/30/23 0337  NA 141 139  K 4.1 3.5  CL 103 107  CO2 28 23  GLUCOSE 137* 75  BUN 12 9  CREATININE 0.79 0.73  CALCIUM 8.2* 7.6*   PT/INR No results for input(s): LABPROT, INR in the last 72 hours.  Studies/Results: MR ABDOMEN MRCP W WO CONTAST Result Date: 08/30/2023 CLINICAL DATA:  Cholelithiasis and pancreatitis. EXAM: MRI ABDOMEN WITHOUT AND WITH CONTRAST (INCLUDING MRCP) TECHNIQUE: Multiplanar multisequence MR imaging of the abdomen was performed both before and after the administration of intravenous contrast. Heavily T2-weighted images of the biliary and pancreatic ducts were obtained, and three-dimensional MRCP images were rendered by post processing. CONTRAST:  6mL GADAVIST  GADOBUTROL  1 MMOL/ML IV SOLN COMPARISON:  Abdominal ultrasound and CT of 1 day prior FINDINGS: Portions of exam are mildly motion degraded. Lower  chest: Normal heart size.  Small bilateral pleural effusions. Hepatobiliary: Normal liver. Tiny dependent gallstones including on 25/4. Gallbladder wall thickening is mild, including at 5 mm. No specific evidence of acute cholecystitis. No intra or extrahepatic biliary duct dilatation. The common duct measures 4 mm on 28/11. No choledocholithiasis. Pancreas: There is minimal edema/fluid in the peripancreatic space, including adjacent the tail on 11/07. No well-defined peripancreatic fluid collection or evidence of pancreatic necrosis. No duct dilatation. Spleen:  Normal in size, without focal abnormality. Adrenals/Urinary Tract: Normal adrenal glands. Normal kidneys, without hydronephrosis. Stomach/Bowel: Status post Roux-en-Y gastric bypass, suboptimally evaluated. Colonic stool burden suggests constipation. Vascular/Lymphatic: Aortic atherosclerosis. No retroperitoneal or retrocrural adenopathy. Other:  Trace perihepatic ascites.  Diffuse anasarca. Musculoskeletal: No acute osseous abnormality. Note is made of small bilateral breast cysts. IMPRESSION: 1. Mildly motion degraded exam. 2. Cholelithiasis with mild gallbladder wall thickening. No specific evidence of acute cholecystitis. 3. No biliary duct dilatation or choledocholithiasis. 4. Trace peripancreatic edema/fluid is nonspecific in the setting of pleural fluid and anasarca. Possibly related to pancreatitis or simply fluid overload. 5.  Possible constipation. Electronically Signed   By: Rockey Kilts M.D.   On: 08/30/2023 08:29   MR 3D Recon At Scanner Result Date: 08/30/2023 CLINICAL DATA:  Cholelithiasis and pancreatitis. EXAM: MRI ABDOMEN WITHOUT AND WITH CONTRAST (INCLUDING MRCP) TECHNIQUE: Multiplanar multisequence MR imaging of the abdomen was performed both before and after the administration of intravenous contrast. Heavily T2-weighted images of the biliary and pancreatic ducts were obtained, and three-dimensional MRCP images were rendered by post  processing.  CONTRAST:  6mL GADAVIST  GADOBUTROL  1 MMOL/ML IV SOLN COMPARISON:  Abdominal ultrasound and CT of 1 day prior FINDINGS: Portions of exam are mildly motion degraded. Lower chest: Normal heart size.  Small bilateral pleural effusions. Hepatobiliary: Normal liver. Tiny dependent gallstones including on 25/4. Gallbladder wall thickening is mild, including at 5 mm. No specific evidence of acute cholecystitis. No intra or extrahepatic biliary duct dilatation. The common duct measures 4 mm on 28/11. No choledocholithiasis. Pancreas: There is minimal edema/fluid in the peripancreatic space, including adjacent the tail on 11/07. No well-defined peripancreatic fluid collection or evidence of pancreatic necrosis. No duct dilatation. Spleen:  Normal in size, without focal abnormality. Adrenals/Urinary Tract: Normal adrenal glands. Normal kidneys, without hydronephrosis. Stomach/Bowel: Status post Roux-en-Y gastric bypass, suboptimally evaluated. Colonic stool burden suggests constipation. Vascular/Lymphatic: Aortic atherosclerosis. No retroperitoneal or retrocrural adenopathy. Other:  Trace perihepatic ascites.  Diffuse anasarca. Musculoskeletal: No acute osseous abnormality. Note is made of small bilateral breast cysts. IMPRESSION: 1. Mildly motion degraded exam. 2. Cholelithiasis with mild gallbladder wall thickening. No specific evidence of acute cholecystitis. 3. No biliary duct dilatation or choledocholithiasis. 4. Trace peripancreatic edema/fluid is nonspecific in the setting of pleural fluid and anasarca. Possibly related to pancreatitis or simply fluid overload. 5.  Possible constipation. Electronically Signed   By: Rockey Kilts M.D.   On: 08/30/2023 08:29   US  Abdomen Limited RUQ (LIVER/GB) Result Date: 08/29/2023 CLINICAL DATA:  RIGHT upper quadrant abdominal pain with nausea and diarrhea EXAM: ULTRASOUND ABDOMEN LIMITED RIGHT UPPER QUADRANT COMPARISON:  CT abdomen pelvis 08/29/2023 FINDINGS:  Gallbladder: Gallstones: Mild cholelithiasis. Sludge: None Gallbladder Wall: Mild gallbladder wall thickening. Pericholecystic fluid: None Sonographic Murphy's Sign: Unable to evaluate due to pain meds Common bile duct: Diameter: 4 mm Liver: Parenchymal echogenicity: Within normal limits Contours: Normal Lesions: None Portal vein: Patent.  Hepatopetal flow Other: None. IMPRESSION: Mild cholelithiasis with mild gallbladder wall thickening may be related to early or low-grade cholecystitis. Further evaluation with HIDA scan would be beneficial. Electronically Signed   By: Aliene Lloyd M.D.   On: 08/29/2023 10:00   CT ABDOMEN PELVIS W CONTRAST Result Date: 08/29/2023 EXAM: CT ABDOMEN AND PELVIS WITH CONTRAST 08/29/2023 04:33:36 AM TECHNIQUE: CT of the abdomen and pelvis was performed with the administration of intravenous contrast. Multiplanar reformatted images are provided for review. Automated exposure control, iterative reconstruction, and/or weight-based adjustment of the mA/kV was utilized to reduce the radiation dose to as low as reasonably achievable. COMPARISON: CT of the abdomen and pelvis dated 07/21/2007. CLINICAL HISTORY: Epigastric pain. Cc of abdominal pain with chills that started at 11pm. Says it is mid abdomen that wraps around to the right side of her back. Emesis x1. Diarrhea x2 ; Stabbing 8/10. FINDINGS: LOWER CHEST: Mild atelectasis present independently within the lower lobes. LIVER: Normal size and contour. GALLBLADDER AND BILE DUCTS: There appeared to be a couple of stones lying independently within the gallbladder. There is also mild edema within the gallbladder wall. SPLEEN: Normal size. No focal lesion. PANCREAS: No mass. No ductal dilatation. ADRENAL GLANDS: Normal appearance. No mass. KIDNEYS, URETERS AND BLADDER: No stones in the kidneys or ureters. No hydronephrosis. No perinephric or periureteral stranding. Urinary bladder is unremarkable. GI AND BOWEL: The patient is again noted to  be status post gastric bypass surgery. Stomach demonstrates no acute abnormality. There is no bowel obstruction. No bowel wall thickening. PERITONEUM AND RETROPERITONEUM: No ascites. No free air. VASCULATURE: There is mild calcific atheromatous disease within the abdominal aorta. LYMPH NODES: No  lymphadenopathy. REPRODUCTIVE ORGANS: The patient is status post hysterectomy. BONES AND SOFT TISSUES: There are marked facet arthritic changes present bilaterally at L4-5 and L5-S1. No acute osseous abnormality. No focal soft tissue abnormality. IMPRESSION: 1. Status post gastric bypass surgery. 2. Gallbladder with a couple of stones and mild wall edema. 3. Status post hysterectomy. Electronically signed by: Evalene Coho MD 08/29/2023 04:45 AM EDT RP Workstation: [id number]    Anti-infectives: Anti-infectives (From admission, onward)    Start     Dose/Rate Route Frequency Ordered Stop   08/30/23 0915  cefoTEtan  (CEFOTAN ) 2 g in sodium chloride  0.9 % 100 mL IVPB        2 g 200 mL/hr over 30 Minutes Intravenous On call to O.R. 08/30/23 0845 08/31/23 0559   08/30/23 0846  sodium chloride  0.9 % with cefoTEtan  (CEFOTAN ) ADS Med       Note to Pharmacy: Romona Shaver E: cabinet override      08/30/23 0846 08/30/23 2059       Assessment/Plan:  Patient is a 53 year old female who was admitted with acute pancreatitis, thought to be gallstone pancreatitis.  -Her LFTs up trended, and T. bili increased to 2.7 from 1.3.  Lipase normalized -MRCP was obtained this morning, and demonstrates no evidence of choledocholithiasis or biliary ductal dilation -We plan to proceed with robotic assisted laparoscopic cholecystectomy today -NPO -IVF -ICG and prophylactic Cefotan  ordered -Patient will need to stay an additional night for recheck of blood work tomorrow morning -Further recommendations to follow surgery -Appreciate hospitalist recommendations   LOS: 1 day    Mckenna Gamm A  Kristine Chahal 08/30/2023  Note: Portions of this report may have been transcribed using voice recognition software. Every effort has been made to ensure accuracy; however, inadvertent computerized transcription errors may still be present.

## 2023-08-30 NOTE — Anesthesia Postprocedure Evaluation (Signed)
 Anesthesia Post Note  Patient: Brooke [name]  Procedure(s) Performed: CHOLECYSTECTOMY, ROBOT-ASSISTED, LAPAROSCOPIC (Abdomen)  Patient location during evaluation: PACU Anesthesia Type: General Level of consciousness: patient cooperative, responds to stimulation and lethargic Pain management: pain level controlled Vital Signs Assessment: post-procedure vital signs reviewed and stable Respiratory status: spontaneous breathing, respiratory function stable and nonlabored ventilation Cardiovascular status: blood pressure returned to baseline and stable Anesthetic complications: no   No notable events documented.   Last Vitals:  Vitals:   08/30/23 0915 08/30/23 1106  BP: 119/75 110/68  Pulse:  71  Resp: 20 20  Temp: 37.1 C 36.7 C  SpO2: 100% 96%    Last Pain:  Vitals:   08/30/23 0902  TempSrc:   PainSc: 0-No pain                 Adine LITTIE Anger

## 2023-08-30 NOTE — Progress Notes (Signed)
 PROGRESS NOTE    TWANNA [name]  FMW:[id number] DOB: Sep 08, 1970 DOA: 08/29/2023 PCP: Tobie Suzzane POUR, MD   Brief Narrative:    Brooke [name] is a 53 y.o. female with medical history significant for prior Roux-en-Y gastric bypass in 2019, seizure-like events, MDD, GAD, insomnia, and GERD who presented to the ED with sudden onset epigastric pain that began around midnight on 8/28 and radiated into her back.  She also had nausea and vomiting and was admitted for gallstone pancreatitis.  She underwent laparoscopic cholecystectomy 8/29 and tolerated the procedure well with no complications.  Assessment & Plan:   Principal Problem:   Acute pancreatitis Active Problems:   Depression, major, single episode, moderate (HCC)   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   Seizure disorder (HCC)   Intractable chronic migraine without aura and without status migrainosus   Anxiety  Assessment and Plan:  Acute gallstone pancreatitis with associated transaminitis status post laparoscopic cholecystectomy 8/29 -Noted to have lipase elevation, but no findings on CT, lipase downtrending -MRCP performed 8/29 with no choledocholithiasis - Appreciate general surgery with laparoscopic cholecystectomy on 8/29 and patient now started on full liquid diet with plans to advance as tolerated -Trend LFTs in a.m.   GERD - PPI   Anxiety disorder/depression - Continue home medications   History of seizure disorder - Continue home medications   Prior history of gastric bypass   DVT prophylaxis:Lovenox  Code Status: Full Family Communication: None at bedside Disposition Plan:  Status is: Inpatient Remains inpatient appropriate because: Need for IV medications.   Consultants:  General Surgery  Procedures:  Lap chole 8/29  Antimicrobials:  Anti-infectives (From admission, onward)    Start     Dose/Rate Route Frequency Ordered Stop   08/30/23 0915  cefoTEtan  (CEFOTAN ) 2 g in sodium chloride  0.9 % 100  mL IVPB        2 g 200 mL/hr over 30 Minutes Intravenous On call to O.R. 08/30/23 0845 08/30/23 1022   08/30/23 0846  sodium chloride  0.9 % with cefoTEtan  (CEFOTAN ) ADS Med       Note to Pharmacy: Romona Shaver E: cabinet override      08/30/23 0846 08/30/23 0957       Subjective: Patient seen and evaluated today with no new acute complaints or concerns. No acute concerns or events noted overnight.  Abdominal pain as well as nausea and vomiting have improved.  She is complaining is of some headaches.  Objective: Vitals:   08/30/23 1130 08/30/23 1138 08/30/23 1145 08/30/23 1158  BP: 100/63  106/68 106/68  Pulse: 65 78 66 71  Resp: 19 16 16 16   Temp:    98.3 F (36.8 C)  TempSrc:    Oral  SpO2: 96% 96% 96% 94%  Weight:      Height:        Intake/Output Summary (Last 24 hours) at 08/30/2023 1211 Last data filed at 08/30/2023 1052 Gross per 24 hour  Intake 1634.52 ml  Output --  Net 1634.52 ml   Filed Weights   08/29/23 0324 08/29/23 1124 08/30/23 0902  Weight: 58.5 kg 63 kg 63 kg    Examination:  General exam: Appears calm and comfortable  Respiratory system: Clear to auscultation. Respiratory effort normal. Cardiovascular system: S1 & S2 heard, RRR.  Gastrointestinal system: Abdomen is soft Central nervous system: Alert and awake Extremities: No edema Skin: No significant lesions noted Psychiatry: Flat affect.    Data Reviewed: I have personally reviewed following labs  and imaging studies  CBC: Recent Labs  Lab 08/29/23 0338 08/30/23 0337  WBC 7.4 8.4  NEUTROABS 6.6  --   HGB 12.3 11.4*  HCT 37.4 34.5*  MCV 97.4 96.1  PLT 236 194   Basic Metabolic Panel: Recent Labs  Lab 08/29/23 0338 08/30/23 0337  NA 141 139  K 4.1 3.5  CL 103 107  CO2 28 23  GLUCOSE 137* 75  BUN 12 9  CREATININE 0.79 0.73  CALCIUM 8.2* 7.6*  MG  --  1.8   GFR: Estimated Creatinine Clearance: 71 mL/min (by C-G formula based on SCr of 0.73 mg/dL). Liver Function  Tests: Recent Labs  Lab 08/29/23 0338 08/30/23 0337  AST 787* 748*  ALT 324* 863*  ALKPHOS 81 100  BILITOT 1.3* 2.7*  PROT 5.8* 5.0*  ALBUMIN 3.3* 2.7*   Recent Labs  Lab 08/29/23 0338 08/30/23 0337  LIPASE 320* 21   No results for input(s): AMMONIA in the last 168 hours. Coagulation Profile: No results for input(s): INR, PROTIME in the last 168 hours. Cardiac Enzymes: No results for input(s): CKTOTAL, CKMB, CKMBINDEX, TROPONINI in the last 168 hours. BNP (last 3 results) No results for input(s): PROBNP in the last 8760 hours. HbA1C: No results for input(s): HGBA1C in the last 72 hours. CBG: Recent Labs  Lab 08/29/23 1651 08/30/23 0854  GLUCAP 87 80   Lipid Profile: No results for input(s): CHOL, HDL, LDLCALC, TRIG, CHOLHDL, LDLDIRECT in the last 72 hours. Thyroid  Function Tests: No results for input(s): TSH, T4TOTAL, FREET4, T3FREE, THYROIDAB in the last 72 hours. Anemia Panel: No results for input(s): VITAMINB12, FOLATE, FERRITIN, TIBC, IRON, RETICCTPCT in the last 72 hours. Sepsis Labs: No results for input(s): PROCALCITON, LATICACIDVEN in the last 168 hours.  Recent Results (from the past 240 hours)  Resp panel by RT-PCR (RSV, Flu A&B, Covid) Anterior Nasal Swab     Status: None   Collection Time: 08/29/23  3:56 AM   Specimen: Anterior Nasal Swab  Result Value Ref Range Status   SARS Coronavirus 2 by RT PCR NEGATIVE NEGATIVE Final    Comment: (NOTE) SARS-CoV-2 target nucleic acids are NOT DETECTED.  The SARS-CoV-2 RNA is generally detectable in upper respiratory specimens during the acute phase of infection. The lowest concentration of SARS-CoV-2 viral copies this assay can detect is 138 copies/mL. A negative result does not preclude SARS-Cov-2 infection and should not be used as the sole basis for treatment or other patient management decisions. A negative result may occur with  improper specimen  collection/handling, submission of specimen other than nasopharyngeal swab, presence of viral mutation(s) within the areas targeted by this assay, and inadequate number of viral copies(<138 copies/mL). A negative result must be combined with clinical observations, patient history, and epidemiological information. The expected result is Negative.  Fact Sheet for Patients:  BloggerCourse.com  Fact Sheet for Healthcare Providers:  SeriousBroker.it  This test is no t yet approved or cleared by the [place]  FDA and  has been authorized for detection and/or diagnosis of SARS-CoV-2 by FDA under an Emergency Use Authorization (EUA). This EUA will remain  in effect (meaning this test can be used) for the duration of the COVID-19 declaration under Section 564(b)(1) of the Act, 21 U.S.C.section 360bbb-3(b)(1), unless the authorization is terminated  or revoked sooner.       Influenza A by PCR NEGATIVE NEGATIVE Final   Influenza B by PCR NEGATIVE NEGATIVE Final    Comment: (NOTE) The Xpert Xpress SARS-CoV-2/FLU/RSV plus assay  is intended as an aid in the diagnosis of influenza from Nasopharyngeal swab specimens and should not be used as a sole basis for treatment. Nasal washings and aspirates are unacceptable for Xpert Xpress SARS-CoV-2/FLU/RSV testing.  Fact Sheet for Patients: BloggerCourse.com  Fact Sheet for Healthcare Providers: SeriousBroker.it  This test is not yet approved or cleared by the [place]  FDA and has been authorized for detection and/or diagnosis of SARS-CoV-2 by FDA under an Emergency Use Authorization (EUA). This EUA will remain in effect (meaning this test can be used) for the duration of the COVID-19 declaration under Section 564(b)(1) of the Act, 21 U.S.C. section 360bbb-3(b)(1), unless the authorization is terminated or revoked.     Resp Syncytial  Virus by PCR NEGATIVE NEGATIVE Final    Comment: (NOTE) Fact Sheet for Patients: BloggerCourse.com  Fact Sheet for Healthcare Providers: SeriousBroker.it  This test is not yet approved or cleared by the [place]  FDA and has been authorized for detection and/or diagnosis of SARS-CoV-2 by FDA under an Emergency Use Authorization (EUA). This EUA will remain in effect (meaning this test can be used) for the duration of the COVID-19 declaration under Section 564(b)(1) of the Act, 21 U.S.C. section 360bbb-3(b)(1), unless the authorization is terminated or revoked.  Performed at [facility], [address] [place], [place] [address]   Surgical PCR screen     Status: None   Collection Time: 08/29/23  7:46 PM   Specimen: Nasal Mucosa; Nasal Swab  Result Value Ref Range Status   MRSA, PCR NEGATIVE NEGATIVE Final   Staphylococcus aureus NEGATIVE NEGATIVE Final    Comment: (NOTE) The Xpert SA Assay (FDA approved for NASAL specimens in patients 31 years of age and older), is one component of a comprehensive surveillance program. It is not intended to diagnose infection nor to guide or monitor treatment. Performed at [facility], [address] [place], [place] [address]          Radiology Studies: MR ABDOMEN MRCP W WO CONTAST Result Date: 08/30/2023 CLINICAL DATA:  Cholelithiasis and pancreatitis. EXAM: MRI ABDOMEN WITHOUT AND WITH CONTRAST (INCLUDING MRCP) TECHNIQUE: Multiplanar multisequence MR imaging of the abdomen was performed both before and after the administration of intravenous contrast. Heavily T2-weighted images of the biliary and pancreatic ducts were obtained, and three-dimensional MRCP images were rendered by post processing. CONTRAST:  6mL GADAVIST  GADOBUTROL  1 MMOL/ML IV SOLN COMPARISON:  Abdominal ultrasound and CT of 1 day prior FINDINGS: Portions of exam are mildly motion degraded. Lower chest: Normal heart size.   Small bilateral pleural effusions. Hepatobiliary: Normal liver. Tiny dependent gallstones including on 25/4. Gallbladder wall thickening is mild, including at 5 mm. No specific evidence of acute cholecystitis. No intra or extrahepatic biliary duct dilatation. The common duct measures 4 mm on 28/11. No choledocholithiasis. Pancreas: There is minimal edema/fluid in the peripancreatic space, including adjacent the tail on 11/07. No well-defined peripancreatic fluid collection or evidence of pancreatic necrosis. No duct dilatation. Spleen:  Normal in size, without focal abnormality. Adrenals/Urinary Tract: Normal adrenal glands. Normal kidneys, without hydronephrosis. Stomach/Bowel: Status post Roux-en-Y gastric bypass, suboptimally evaluated. Colonic stool burden suggests constipation. Vascular/Lymphatic: Aortic atherosclerosis. No retroperitoneal or retrocrural adenopathy. Other:  Trace perihepatic ascites.  Diffuse anasarca. Musculoskeletal: No acute osseous abnormality. Note is made of small bilateral breast cysts. IMPRESSION: 1. Mildly motion degraded exam. 2. Cholelithiasis with mild gallbladder wall thickening. No specific evidence of acute cholecystitis. 3. No biliary duct dilatation or choledocholithiasis. 4. Trace peripancreatic edema/fluid is nonspecific in  the setting of pleural fluid and anasarca. Possibly related to pancreatitis or simply fluid overload. 5.  Possible constipation. Electronically Signed   By: Rockey Kilts M.D.   On: 08/30/2023 08:29   MR 3D Recon At Scanner Result Date: 08/30/2023 CLINICAL DATA:  Cholelithiasis and pancreatitis. EXAM: MRI ABDOMEN WITHOUT AND WITH CONTRAST (INCLUDING MRCP) TECHNIQUE: Multiplanar multisequence MR imaging of the abdomen was performed both before and after the administration of intravenous contrast. Heavily T2-weighted images of the biliary and pancreatic ducts were obtained, and three-dimensional MRCP images were rendered by post processing. CONTRAST:  6mL  GADAVIST  GADOBUTROL  1 MMOL/ML IV SOLN COMPARISON:  Abdominal ultrasound and CT of 1 day prior FINDINGS: Portions of exam are mildly motion degraded. Lower chest: Normal heart size.  Small bilateral pleural effusions. Hepatobiliary: Normal liver. Tiny dependent gallstones including on 25/4. Gallbladder wall thickening is mild, including at 5 mm. No specific evidence of acute cholecystitis. No intra or extrahepatic biliary duct dilatation. The common duct measures 4 mm on 28/11. No choledocholithiasis. Pancreas: There is minimal edema/fluid in the peripancreatic space, including adjacent the tail on 11/07. No well-defined peripancreatic fluid collection or evidence of pancreatic necrosis. No duct dilatation. Spleen:  Normal in size, without focal abnormality. Adrenals/Urinary Tract: Normal adrenal glands. Normal kidneys, without hydronephrosis. Stomach/Bowel: Status post Roux-en-Y gastric bypass, suboptimally evaluated. Colonic stool burden suggests constipation. Vascular/Lymphatic: Aortic atherosclerosis. No retroperitoneal or retrocrural adenopathy. Other:  Trace perihepatic ascites.  Diffuse anasarca. Musculoskeletal: No acute osseous abnormality. Note is made of small bilateral breast cysts. IMPRESSION: 1. Mildly motion degraded exam. 2. Cholelithiasis with mild gallbladder wall thickening. No specific evidence of acute cholecystitis. 3. No biliary duct dilatation or choledocholithiasis. 4. Trace peripancreatic edema/fluid is nonspecific in the setting of pleural fluid and anasarca. Possibly related to pancreatitis or simply fluid overload. 5.  Possible constipation. Electronically Signed   By: Rockey Kilts M.D.   On: 08/30/2023 08:29   US  Abdomen Limited RUQ (LIVER/GB) Result Date: 08/29/2023 CLINICAL DATA:  RIGHT upper quadrant abdominal pain with nausea and diarrhea EXAM: ULTRASOUND ABDOMEN LIMITED RIGHT UPPER QUADRANT COMPARISON:  CT abdomen pelvis 08/29/2023 FINDINGS: Gallbladder: Gallstones: Mild  cholelithiasis. Sludge: None Gallbladder Wall: Mild gallbladder wall thickening. Pericholecystic fluid: None Sonographic Murphy's Sign: Unable to evaluate due to pain meds Common bile duct: Diameter: 4 mm Liver: Parenchymal echogenicity: Within normal limits Contours: Normal Lesions: None Portal vein: Patent.  Hepatopetal flow Other: None. IMPRESSION: Mild cholelithiasis with mild gallbladder wall thickening may be related to early or low-grade cholecystitis. Further evaluation with HIDA scan would be beneficial. Electronically Signed   By: Aliene Lloyd M.D.   On: 08/29/2023 10:00   CT ABDOMEN PELVIS W CONTRAST Result Date: 08/29/2023 EXAM: CT ABDOMEN AND PELVIS WITH CONTRAST 08/29/2023 04:33:36 AM TECHNIQUE: CT of the abdomen and pelvis was performed with the administration of intravenous contrast. Multiplanar reformatted images are provided for review. Automated exposure control, iterative reconstruction, and/or weight-based adjustment of the mA/kV was utilized to reduce the radiation dose to as low as reasonably achievable. COMPARISON: CT of the abdomen and pelvis dated 07/21/2007. CLINICAL HISTORY: Epigastric pain. Cc of abdominal pain with chills that started at 11pm. Says it is mid abdomen that wraps around to the right side of her back. Emesis x1. Diarrhea x2 ; Stabbing 8/10. FINDINGS: LOWER CHEST: Mild atelectasis present independently within the lower lobes. LIVER: Normal size and contour. GALLBLADDER AND BILE DUCTS: There appeared to be a couple of stones lying independently within the gallbladder. There is also  mild edema within the gallbladder wall. SPLEEN: Normal size. No focal lesion. PANCREAS: No mass. No ductal dilatation. ADRENAL GLANDS: Normal appearance. No mass. KIDNEYS, URETERS AND BLADDER: No stones in the kidneys or ureters. No hydronephrosis. No perinephric or periureteral stranding. Urinary bladder is unremarkable. GI AND BOWEL: The patient is again noted to be status post gastric bypass  surgery. Stomach demonstrates no acute abnormality. There is no bowel obstruction. No bowel wall thickening. PERITONEUM AND RETROPERITONEUM: No ascites. No free air. VASCULATURE: There is mild calcific atheromatous disease within the abdominal aorta. LYMPH NODES: No lymphadenopathy. REPRODUCTIVE ORGANS: The patient is status post hysterectomy. BONES AND SOFT TISSUES: There are marked facet arthritic changes present bilaterally at L4-5 and L5-S1. No acute osseous abnormality. No focal soft tissue abnormality. IMPRESSION: 1. Status post gastric bypass surgery. 2. Gallbladder with a couple of stones and mild wall edema. 3. Status post hysterectomy. Electronically signed by: Timothy Berrigan MD 08/29/2023 04:45 AM EDT RP Workstation: [id number]        Scheduled Meds:  busPIRone   15 mg Oral TID   enoxaparin  (LOVENOX ) injection  40 mg Subcutaneous Q24H   fluticasone   1 spray Each Nare BID   lamoTRIgine   200 mg Oral Daily   levETIRAcetam   750 mg Oral BID   mupirocin  ointment  1 Application Nasal BID   pantoprazole   40 mg Oral BID AC   prazosin   2 mg Oral QHS   scopolamine   1 patch Transdermal Q72H     LOS: 1 day    Time spent: 55 minutes    Avayah Raffety JONETTA Fairly, DO [facility]  If 7PM-7AM, please contact night-coverage [email or web address] 08/30/2023, 12:11 PM

## 2023-08-31 DIAGNOSIS — K851 Biliary acute pancreatitis without necrosis or infection: Secondary | ICD-10-CM | POA: Diagnosis not present

## 2023-08-31 LAB — CBC
HCT: 31.5 % — ABNORMAL LOW (ref 36.0–46.0)
Hemoglobin: 10.4 g/dL — ABNORMAL LOW (ref 12.0–15.0)
MCH: 31.3 pg (ref 26.0–34.0)
MCHC: 33 g/dL (ref 30.0–36.0)
MCV: 94.9 fL (ref 80.0–100.0)
Platelets: 180 K/uL (ref 150–400)
RBC: 3.32 MIL/uL — ABNORMAL LOW (ref 3.87–5.11)
RDW: 13.9 % (ref 11.5–15.5)
WBC: 8.3 K/uL (ref 4.0–10.5)
nRBC: 0 % (ref 0.0–0.2)

## 2023-08-31 LAB — COMPREHENSIVE METABOLIC PANEL WITH GFR
ALT: 573 U/L — ABNORMAL HIGH (ref 0–44)
AST: 251 U/L — ABNORMAL HIGH (ref 15–41)
Albumin: 2.4 g/dL — ABNORMAL LOW (ref 3.5–5.0)
Alkaline Phosphatase: 92 U/L (ref 38–126)
Anion gap: 7 (ref 5–15)
BUN: 8 mg/dL (ref 6–20)
CO2: 24 mmol/L (ref 22–32)
Calcium: 7.9 mg/dL — ABNORMAL LOW (ref 8.9–10.3)
Chloride: 108 mmol/L (ref 98–111)
Creatinine, Ser: 0.65 mg/dL (ref 0.44–1.00)
GFR, Estimated: >60 mL/min (ref >60)
Glucose, Bld: 94 mg/dL (ref 70–99)
Potassium: 4.2 mmol/L (ref 3.5–5.1)
Sodium: 139 mmol/L (ref 135–145)
Total Bilirubin: 0.9 mg/dL (ref 0.0–1.2)
Total Protein: 4.9 g/dL — ABNORMAL LOW (ref 6.5–8.1)

## 2023-08-31 LAB — MAGNESIUM: Magnesium: 2 mg/dL (ref 1.7–2.4)

## 2023-08-31 MED ORDER — DOCUSATE SODIUM 100 MG PO CAPS
100.0000 mg | ORAL_CAPSULE | Freq: Two times a day (BID) | ORAL | 2 refills | Status: AC
Start: 2023-08-31 — End: 2024-08-30

## 2023-08-31 MED ORDER — ACETAMINOPHEN 500 MG PO TABS: 1000.0000 mg | ORAL_TABLET | Freq: Four times a day (QID) | ORAL | 0 refills | Status: AC

## 2023-08-31 MED ORDER — OXYCODONE HCL 5 MG PO TABS
5.0000 mg | ORAL_TABLET | Freq: Four times a day (QID) | ORAL | 0 refills | Status: DC | PRN
Start: 2023-08-31 — End: 2023-10-07

## 2023-08-31 MED ORDER — ONDANSETRON HCL 4 MG PO TABS: 4.0000 mg | ORAL_TABLET | Freq: Every day | ORAL | 1 refills | Status: AC | PRN

## 2023-08-31 NOTE — Discharge Summary (Signed)
 Physician Discharge Summary  [name]:[id number] DOB: 10-04-1970 DOA: 08/29/2023  PCP: Tobie Suzzane POUR, MD  Admit date: 08/29/2023  Discharge date: 08/31/2023  Admitted From:Home  Disposition:  Home  Recommendations for Outpatient Follow-up:  Follow up with general surgery in 2 weeks Continue home medications as noted below and prazosin  discontinued due to soft blood pressure readings during hospitalization See other home medications as noted below  Home Health: None  Equipment/Devices: None  Discharge Condition:Stable  CODE STATUS: Full  Diet recommendation: Heart Healthy  Brief/Interim Summary: Brooke [name] is a 53 y.o. female with medical history significant for prior Roux-en-Y gastric bypass in 2019, seizure-like events, MDD, GAD, insomnia, and GERD who presented to the ED with sudden onset epigastric pain that began around midnight on 8/28 and radiated into her back.  She also had nausea and vomiting and was admitted for gallstone pancreatitis.  She underwent laparoscopic cholecystectomy 8/29 and tolerated the procedure well with no complications.  She was noted to have some softer blood pressure readings the following day, but was otherwise asymptomatic.  She is tolerating diet with no further abdominal pain, nausea, or vomiting.  She is now in stable condition for discharge with no other acute events or concerns noted.  Discharge Diagnoses:  Principal Problem:   Acute pancreatitis Active Problems:   Depression, major, single episode, moderate (HCC)   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   Seizure disorder (HCC)   Intractable chronic migraine without aura and without status migrainosus   Anxiety  Principal discharge diagnosis: Acute gallstone pancreatitis status post laparoscopic cholecystectomy 8/29.  Discharge Instructions  Discharge Instructions     Call MD for:  persistant nausea and vomiting   Complete by: As directed    Call MD for:  redness,  tenderness, or signs of infection (pain, swelling, redness, odor or green/yellow discharge around incision site)   Complete by: As directed    Call MD for:  severe uncontrolled pain   Complete by: As directed    Call MD for:  temperature >100.4   Complete by: As directed    Diet - low sodium heart healthy   Complete by: As directed    Diet - low sodium heart healthy   Complete by: As directed    Increase activity slowly   Complete by: As directed    Increase activity slowly   Complete by: As directed       Allergies as of 08/31/2023       Reactions   Progesterone  Other (See Comments)   headaches        Medication List     STOP taking these medications    ondansetron  4 MG disintegrating tablet Commonly known as: ZOFRAN -ODT   prazosin  2 MG capsule Commonly known as: MINIPRESS        TAKE these medications    acetaminophen  500 MG tablet Commonly known as: TYLENOL  Take 2 tablets (1,000 mg total) by mouth every 6 (six) hours for 7 days. What changed:  when to take this reasons to take this   BARIATRIC MULTIVITAMINS/IRON PO Take 1 tablet by mouth daily.   Biotin 5000 MCG Tabs Take 5,000 mcg by mouth daily.   busPIRone  30 MG tablet Commonly known as: BUSPAR  Take 1 tablet (30 mg total) by mouth 2 (two) times daily. What changed:  how much to take when to take this   docusate sodium  100 MG capsule Commonly known as: Colace Take 1 capsule (100 mg total) by  mouth 2 (two) times daily.   fluticasone  50 MCG/ACT nasal spray Commonly known as: FLONASE  Place 1 spray into both nostrils 2 (two) times daily.   lamoTRIgine  100 MG tablet Commonly known as: LAMICTAL  Take 2 tablets (200 mg total) by mouth daily. What changed: Another medication with the same name was removed. Continue taking this medication, and follow the directions you see here.   levETIRAcetam  750 MG tablet Commonly known as: KEPPRA  Take 1 tablet (750 mg total) by mouth 2 (two) times daily.    meclizine  25 MG tablet Commonly known as: ANTIVERT  TAKE 1 TABLET(25 MG) BY MOUTH THREE TIMES DAILY AS NEEDED FOR DIZZINESS   ondansetron  4 MG tablet Commonly known as: Zofran  Take 1 tablet (4 mg total) by mouth daily as needed for nausea or vomiting.   oxyCODONE  5 MG immediate release tablet Commonly known as: Roxicodone  Take 1 tablet (5 mg total) by mouth every 6 (six) hours as needed.   pantoprazole  40 MG tablet Commonly known as: PROTONIX  Take 1 tablet (40 mg total) by mouth 2 (two) times daily before a meal.   scopolamine  1 MG/3DAYS Commonly known as: TRANSDERM-SCOP Place 1 patch (1.5 mg total) onto the skin every 3 (three) days.   Trulicity  1.5 MG/0.5ML Soaj Generic drug: Dulaglutide  Inject 1.5 mg into the skin once a week.        Follow-up Information     Pappayliou, Dorothyann LABOR, DO. Call.   Specialty: General Surgery Why: I will call you for a phone follow up in 2 weeks Contact information: [id number] [place] [facility] [address] [phone]                Allergies  Allergen Reactions   Progesterone  Other (See Comments)    headaches    Consultations: General Surgery   Procedures/Studies: MR ABDOMEN MRCP W WO CONTAST Result Date: 08/30/2023 CLINICAL DATA:  Cholelithiasis and pancreatitis. EXAM: MRI ABDOMEN WITHOUT AND WITH CONTRAST (INCLUDING MRCP) TECHNIQUE: Multiplanar multisequence MR imaging of the abdomen was performed both before and after the administration of intravenous contrast. Heavily T2-weighted images of the biliary and pancreatic ducts were obtained, and three-dimensional MRCP images were rendered by post processing. CONTRAST:  6mL GADAVIST  GADOBUTROL  1 MMOL/ML IV SOLN COMPARISON:  Abdominal ultrasound and CT of 1 day prior FINDINGS: Portions of exam are mildly motion degraded. Lower chest: Normal heart size.  Small bilateral pleural effusions. Hepatobiliary: Normal liver. Tiny dependent gallstones including on 25/4. Gallbladder wall  thickening is mild, including at 5 mm. No specific evidence of acute cholecystitis. No intra or extrahepatic biliary duct dilatation. The common duct measures 4 mm on 28/11. No choledocholithiasis. Pancreas: There is minimal edema/fluid in the peripancreatic space, including adjacent the tail on 11/07. No well-defined peripancreatic fluid collection or evidence of pancreatic necrosis. No duct dilatation. Spleen:  Normal in size, without focal abnormality. Adrenals/Urinary Tract: Normal adrenal glands. Normal kidneys, without hydronephrosis. Stomach/Bowel: Status post Roux-en-Y gastric bypass, suboptimally evaluated. Colonic stool burden suggests constipation. Vascular/Lymphatic: Aortic atherosclerosis. No retroperitoneal or retrocrural adenopathy. Other:  Trace perihepatic ascites.  Diffuse anasarca. Musculoskeletal: No acute osseous abnormality. Note is made of small bilateral breast cysts. IMPRESSION: 1. Mildly motion degraded exam. 2. Cholelithiasis with mild gallbladder wall thickening. No specific evidence of acute cholecystitis. 3. No biliary duct dilatation or choledocholithiasis. 4. Trace peripancreatic edema/fluid is nonspecific in the setting of pleural fluid and anasarca. Possibly related to pancreatitis or simply fluid overload. 5.  Possible constipation. Electronically Signed   By: Rockey Kilts  M.D.   On: 08/30/2023 08:29   MR 3D Recon At Scanner Result Date: 08/30/2023 CLINICAL DATA:  Cholelithiasis and pancreatitis. EXAM: MRI ABDOMEN WITHOUT AND WITH CONTRAST (INCLUDING MRCP) TECHNIQUE: Multiplanar multisequence MR imaging of the abdomen was performed both before and after the administration of intravenous contrast. Heavily T2-weighted images of the biliary and pancreatic ducts were obtained, and three-dimensional MRCP images were rendered by post processing. CONTRAST:  6mL GADAVIST  GADOBUTROL  1 MMOL/ML IV SOLN COMPARISON:  Abdominal ultrasound and CT of 1 day prior FINDINGS: Portions of exam are  mildly motion degraded. Lower chest: Normal heart size.  Small bilateral pleural effusions. Hepatobiliary: Normal liver. Tiny dependent gallstones including on 25/4. Gallbladder wall thickening is mild, including at 5 mm. No specific evidence of acute cholecystitis. No intra or extrahepatic biliary duct dilatation. The common duct measures 4 mm on 28/11. No choledocholithiasis. Pancreas: There is minimal edema/fluid in the peripancreatic space, including adjacent the tail on 11/07. No well-defined peripancreatic fluid collection or evidence of pancreatic necrosis. No duct dilatation. Spleen:  Normal in size, without focal abnormality. Adrenals/Urinary Tract: Normal adrenal glands. Normal kidneys, without hydronephrosis. Stomach/Bowel: Status post Roux-en-Y gastric bypass, suboptimally evaluated. Colonic stool burden suggests constipation. Vascular/Lymphatic: Aortic atherosclerosis. No retroperitoneal or retrocrural adenopathy. Other:  Trace perihepatic ascites.  Diffuse anasarca. Musculoskeletal: No acute osseous abnormality. Note is made of small bilateral breast cysts. IMPRESSION: 1. Mildly motion degraded exam. 2. Cholelithiasis with mild gallbladder wall thickening. No specific evidence of acute cholecystitis. 3. No biliary duct dilatation or choledocholithiasis. 4. Trace peripancreatic edema/fluid is nonspecific in the setting of pleural fluid and anasarca. Possibly related to pancreatitis or simply fluid overload. 5.  Possible constipation. Electronically Signed   By: Rockey Kilts M.D.   On: 08/30/2023 08:29   US  Abdomen Limited RUQ (LIVER/GB) Result Date: 08/29/2023 CLINICAL DATA:  RIGHT upper quadrant abdominal pain with nausea and diarrhea EXAM: ULTRASOUND ABDOMEN LIMITED RIGHT UPPER QUADRANT COMPARISON:  CT abdomen pelvis 08/29/2023 FINDINGS: Gallbladder: Gallstones: Mild cholelithiasis. Sludge: None Gallbladder Wall: Mild gallbladder wall thickening. Pericholecystic fluid: None Sonographic Murphy's  Sign: Unable to evaluate due to pain meds Common bile duct: Diameter: 4 mm Liver: Parenchymal echogenicity: Within normal limits Contours: Normal Lesions: None Portal vein: Patent.  Hepatopetal flow Other: None. IMPRESSION: Mild cholelithiasis with mild gallbladder wall thickening may be related to early or low-grade cholecystitis. Further evaluation with HIDA scan would be beneficial. Electronically Signed   By: Aliene Lloyd M.D.   On: 08/29/2023 10:00   CT ABDOMEN PELVIS W CONTRAST Result Date: 08/29/2023 EXAM: CT ABDOMEN AND PELVIS WITH CONTRAST 08/29/2023 04:33:36 AM TECHNIQUE: CT of the abdomen and pelvis was performed with the administration of intravenous contrast. Multiplanar reformatted images are provided for review. Automated exposure control, iterative reconstruction, and/or weight-based adjustment of the mA/kV was utilized to reduce the radiation dose to as low as reasonably achievable. COMPARISON: CT of the abdomen and pelvis dated 07/21/2007. CLINICAL HISTORY: Epigastric pain. Cc of abdominal pain with chills that started at 11pm. Says it is mid abdomen that wraps around to the right side of her back. Emesis x1. Diarrhea x2 ; Stabbing 8/10. FINDINGS: LOWER CHEST: Mild atelectasis present independently within the lower lobes. LIVER: Normal size and contour. GALLBLADDER AND BILE DUCTS: There appeared to be a couple of stones lying independently within the gallbladder. There is also mild edema within the gallbladder wall. SPLEEN: Normal size. No focal lesion. PANCREAS: No mass. No ductal dilatation. ADRENAL GLANDS: Normal appearance. No mass. KIDNEYS, URETERS AND  BLADDER: No stones in the kidneys or ureters. No hydronephrosis. No perinephric or periureteral stranding. Urinary bladder is unremarkable. GI AND BOWEL: The patient is again noted to be status post gastric bypass surgery. Stomach demonstrates no acute abnormality. There is no bowel obstruction. No bowel wall thickening. PERITONEUM AND  RETROPERITONEUM: No ascites. No free air. VASCULATURE: There is mild calcific atheromatous disease within the abdominal aorta. LYMPH NODES: No lymphadenopathy. REPRODUCTIVE ORGANS: The patient is status post hysterectomy. BONES AND SOFT TISSUES: There are marked facet arthritic changes present bilaterally at L4-5 and L5-S1. No acute osseous abnormality. No focal soft tissue abnormality. IMPRESSION: 1. Status post gastric bypass surgery. 2. Gallbladder with a couple of stones and mild wall edema. 3. Status post hysterectomy. Electronically signed by: Evalene Coho MD 08/29/2023 04:45 AM EDT RP Workstation: [id number]     Discharge Exam: Vitals:   08/31/23 0931 08/31/23 0932  BP: 123/70 115/82  Pulse: 66 64  Resp: 18 18  Temp: 98.5 F (36.9 C) 98.5 F (36.9 C)  SpO2: 97% 97%   Vitals:   08/31/23 0139 08/31/23 0929 08/31/23 0931 08/31/23 0932  BP: (!) 99/45 111/77 123/70 115/82  Pulse: (!) 54 69 66 64  Resp: 18 18 18 18   Temp: 97.8 F (36.6 C) 98.5 F (36.9 C) 98.5 F (36.9 C) 98.5 F (36.9 C)  TempSrc: Oral Oral Oral Oral  SpO2: 97% 97% 97% 97%  Weight:      Height:        General: Pt is alert, awake, not in acute distress Cardiovascular: RRR, S1/S2 +, no rubs, no gallops Respiratory: CTA bilaterally, no wheezing, no rhonchi Abdominal: Soft, NT, ND, bowel sounds + Extremities: no edema, no cyanosis    The results of significant diagnostics from this hospitalization (including imaging, microbiology, ancillary and laboratory) are listed below for reference.     Microbiology: Recent Results (from the past 240 hours)  Resp panel by RT-PCR (RSV, Flu A&B, Covid) Anterior Nasal Swab     Status: None   Collection Time: 08/29/23  3:56 AM   Specimen: Anterior Nasal Swab  Result Value Ref Range Status   SARS Coronavirus 2 by RT PCR NEGATIVE NEGATIVE Final    Comment: (NOTE) SARS-CoV-2 target nucleic acids are NOT DETECTED.  The SARS-CoV-2 RNA is generally detectable in upper  respiratory specimens during the acute phase of infection. The lowest concentration of SARS-CoV-2 viral copies this assay can detect is 138 copies/mL. A negative result does not preclude SARS-Cov-2 infection and should not be used as the sole basis for treatment or other patient management decisions. A negative result may occur with  improper specimen collection/handling, submission of specimen other than nasopharyngeal swab, presence of viral mutation(s) within the areas targeted by this assay, and inadequate number of viral copies(<138 copies/mL). A negative result must be combined with clinical observations, patient history, and epidemiological information. The expected result is Negative.  Fact Sheet for Patients:  BloggerCourse.com  Fact Sheet for Healthcare Providers:  SeriousBroker.it  This test is no t yet approved or cleared by the [place]  FDA and  has been authorized for detection and/or diagnosis of SARS-CoV-2 by FDA under an Emergency Use Authorization (EUA). This EUA will remain  in effect (meaning this test can be used) for the duration of the COVID-19 declaration under Section 564(b)(1) of the Act, 21 U.S.C.section 360bbb-3(b)(1), unless the authorization is terminated  or revoked sooner.       Influenza A by PCR NEGATIVE NEGATIVE Final  Influenza B by PCR NEGATIVE NEGATIVE Final    Comment: (NOTE) The Xpert Xpress SARS-CoV-2/FLU/RSV plus assay is intended as an aid in the diagnosis of influenza from Nasopharyngeal swab specimens and should not be used as a sole basis for treatment. Nasal washings and aspirates are unacceptable for Xpert Xpress SARS-CoV-2/FLU/RSV testing.  Fact Sheet for Patients: BloggerCourse.com  Fact Sheet for Healthcare Providers: SeriousBroker.it  This test is not yet approved or cleared by the [place]  FDA and has been  authorized for detection and/or diagnosis of SARS-CoV-2 by FDA under an Emergency Use Authorization (EUA). This EUA will remain in effect (meaning this test can be used) for the duration of the COVID-19 declaration under Section 564(b)(1) of the Act, 21 U.S.C. section 360bbb-3(b)(1), unless the authorization is terminated or revoked.     Resp Syncytial Virus by PCR NEGATIVE NEGATIVE Final    Comment: (NOTE) Fact Sheet for Patients: BloggerCourse.com  Fact Sheet for Healthcare Providers: SeriousBroker.it  This test is not yet approved or cleared by the [place]  FDA and has been authorized for detection and/or diagnosis of SARS-CoV-2 by FDA under an Emergency Use Authorization (EUA). This EUA will remain in effect (meaning this test can be used) for the duration of the COVID-19 declaration under Section 564(b)(1) of the Act, 21 U.S.C. section 360bbb-3(b)(1), unless the authorization is terminated or revoked.  Performed at [facility], [address], [place], [place] [address]   Surgical PCR screen     Status: None   Collection Time: 08/29/23  7:46 PM   Specimen: Nasal Mucosa; Nasal Swab  Result Value Ref Range Status   MRSA, PCR NEGATIVE NEGATIVE Final   Staphylococcus aureus NEGATIVE NEGATIVE Final    Comment: (NOTE) The Xpert SA Assay (FDA approved for NASAL specimens in patients 18 years of age and older), is one component of a comprehensive surveillance program. It is not intended to diagnose infection nor to guide or monitor treatment. Performed at [facility], [address] [place], [place] [address]      Labs: BNP (last 3 results) No results for input(s): BNP in the last 8760 hours. Basic Metabolic Panel: Recent Labs  Lab 08/29/23 0338 08/30/23 0337 08/31/23 0350  NA 141 139 139  K 4.1 3.5 4.2  CL 103 107 108  CO2 28 23 24   GLUCOSE 137* 75 94  BUN 12 9 8   CREATININE 0.79 0.73 0.65  CALCIUM 8.2*  7.6* 7.9*  MG  --  1.8 2.0   Liver Function Tests: Recent Labs  Lab 08/29/23 0338 08/30/23 0337 08/31/23 0350  AST 787* 748* 251*  ALT 324* 863* 573*  ALKPHOS 81 100 92  BILITOT 1.3* 2.7* 0.9  PROT 5.8* 5.0* 4.9*  ALBUMIN 3.3* 2.7* 2.4*   Recent Labs  Lab 08/29/23 0338 08/30/23 0337  LIPASE 320* 21   No results for input(s): AMMONIA in the last 168 hours. CBC: Recent Labs  Lab 08/29/23 0338 08/30/23 0337 08/31/23 0350  WBC 7.4 8.4 8.3  NEUTROABS 6.6  --   --   HGB 12.3 11.4* 10.4*  HCT 37.4 34.5* 31.5*  MCV 97.4 96.1 94.9  PLT 236 194 180   Cardiac Enzymes: No results for input(s): CKTOTAL, CKMB, CKMBINDEX, TROPONINI in the last 168 hours. BNP: Invalid input(s): POCBNP CBG: Recent Labs  Lab 08/29/23 1651 08/30/23 0854  GLUCAP 87 80   D-Dimer No results for input(s): DDIMER in the last 72 hours. Hgb A1c No results for input(s): HGBA1C in the last 72  hours. Lipid Profile No results for input(s): CHOL, HDL, LDLCALC, TRIG, CHOLHDL, LDLDIRECT in the last 72 hours. Thyroid  function studies No results for input(s): TSH, T4TOTAL, T3FREE, THYROIDAB in the last 72 hours.  Invalid input(s): FREET3 Anemia work up No results for input(s): VITAMINB12, FOLATE, FERRITIN, TIBC, IRON, RETICCTPCT in the last 72 hours. Urinalysis    Component Value Date/Time   COLORURINE YELLOW 08/08/2022 1235   APPEARANCEUR HAZY (A) 08/08/2022 1235   LABSPEC 1.021 08/08/2022 1235   PHURINE 5.0 08/08/2022 1235   GLUCOSEU NEGATIVE 08/08/2022 1235   HGBUR NEGATIVE 08/08/2022 1235   BILIRUBINUR NEGATIVE 08/08/2022 1235   BILIRUBINUR negative 08/06/2019 0931   KETONESUR NEGATIVE 08/08/2022 1235   PROTEINUR NEGATIVE 08/08/2022 1235   UROBILINOGEN 0.2 08/06/2019 0931   NITRITE NEGATIVE 08/08/2022 1235   LEUKOCYTESUR NEGATIVE 08/08/2022 1235   Sepsis Labs Recent Labs  Lab 08/29/23 0338 08/30/23 0337 08/31/23 0350  WBC 7.4 8.4 8.3    Microbiology Recent Results (from the past 240 hours)  Resp panel by RT-PCR (RSV, Flu A&B, Covid) Anterior Nasal Swab     Status: None   Collection Time: 08/29/23  3:56 AM   Specimen: Anterior Nasal Swab  Result Value Ref Range Status   SARS Coronavirus 2 by RT PCR NEGATIVE NEGATIVE Final    Comment: (NOTE) SARS-CoV-2 target nucleic acids are NOT DETECTED.  The SARS-CoV-2 RNA is generally detectable in upper respiratory specimens during the acute phase of infection. The lowest concentration of SARS-CoV-2 viral copies this assay can detect is 138 copies/mL. A negative result does not preclude SARS-Cov-2 infection and should not be used as the sole basis for treatment or other patient management decisions. A negative result may occur with  improper specimen collection/handling, submission of specimen other than nasopharyngeal swab, presence of viral mutation(s) within the areas targeted by this assay, and inadequate number of viral copies(<138 copies/mL). A negative result must be combined with clinical observations, patient history, and epidemiological information. The expected result is Negative.  Fact Sheet for Patients:  BloggerCourse.com  Fact Sheet for Healthcare Providers:  SeriousBroker.it  This test is no t yet approved or cleared by the [place]  FDA and  has been authorized for detection and/or diagnosis of SARS-CoV-2 by FDA under an Emergency Use Authorization (EUA). This EUA will remain  in effect (meaning this test can be used) for the duration of the COVID-19 declaration under Section 564(b)(1) of the Act, 21 U.S.C.section 360bbb-3(b)(1), unless the authorization is terminated  or revoked sooner.       Influenza A by PCR NEGATIVE NEGATIVE Final   Influenza B by PCR NEGATIVE NEGATIVE Final    Comment: (NOTE) The Xpert Xpress SARS-CoV-2/FLU/RSV plus assay is intended as an aid in the diagnosis of influenza  from Nasopharyngeal swab specimens and should not be used as a sole basis for treatment. Nasal washings and aspirates are unacceptable for Xpert Xpress SARS-CoV-2/FLU/RSV testing.  Fact Sheet for Patients: BloggerCourse.com  Fact Sheet for Healthcare Providers: SeriousBroker.it  This test is not yet approved or cleared by the [place]  FDA and has been authorized for detection and/or diagnosis of SARS-CoV-2 by FDA under an Emergency Use Authorization (EUA). This EUA will remain in effect (meaning this test can be used) for the duration of the COVID-19 declaration under Section 564(b)(1) of the Act, 21 U.S.C. section 360bbb-3(b)(1), unless the authorization is terminated or revoked.     Resp Syncytial Virus by PCR NEGATIVE NEGATIVE Final    Comment: (NOTE) Fact Sheet for  Patients: BloggerCourse.com  Fact Sheet for Healthcare Providers: SeriousBroker.it  This test is not yet approved or cleared by the [place]  FDA and has been authorized for detection and/or diagnosis of SARS-CoV-2 by FDA under an Emergency Use Authorization (EUA). This EUA will remain in effect (meaning this test can be used) for the duration of the COVID-19 declaration under Section 564(b)(1) of the Act, 21 U.S.C. section 360bbb-3(b)(1), unless the authorization is terminated or revoked.  Performed at [facility], [address], [place], [place] [address]   Surgical PCR screen     Status: None   Collection Time: 08/29/23  7:46 PM   Specimen: Nasal Mucosa; Nasal Swab  Result Value Ref Range Status   MRSA, PCR NEGATIVE NEGATIVE Final   Staphylococcus aureus NEGATIVE NEGATIVE Final    Comment: (NOTE) The Xpert SA Assay (FDA approved for NASAL specimens in patients 37 years of age and older), is one component of a comprehensive surveillance program. It is not intended to diagnose infection nor  to guide or monitor treatment. Performed at [facility], [address], [place], [place] [address]      Time coordinating discharge: 35 minutes  SIGNED:   Adron JONETTA Fairly, DO [facility] 08/31/2023, 11:57 AM  If 7PM-7AM, please contact night-coverage [email or web address]

## 2023-08-31 NOTE — Progress Notes (Signed)
 [facility] Progress Note  1 Day Post-Op  Subjective: Patient seen and examined.  She is resting comfortably in bed.  She tolerated her diet without nausea and vomiting.  She still has some epigastric tenderness, but otherwise is feeling well.  Objective: Vital signs in last 24 hours: Temp:  [97.8 F (36.6 C)-98.8 F (37.1 C)] 98.5 F (36.9 C) (08/30 0932) Pulse Rate:  [54-78] 64 (08/30 0932) Resp:  [18-20] 18 (08/30 0932) BP: (99-123)/(45-82) 115/82 (08/30 0932) SpO2:  [97 %] 97 % (08/30 0932) Last BM Date : 08/30/23  Intake/Output from previous day: 08/29 0701 - 08/30 0700 In: 2865.4 [P.O.:360; I.V.:2405.4; IV Piggyback:100] Out: -  Intake/Output this shift: No intake/output data recorded.  General appearance: alert, cooperative, and no distress GI: Abdomen soft, nondistended, no percussion tenderness, nontender to palpation; no rigidity, guarding, rebound tenderness; laparoscopic incision sites C/D/I with Dermabond in place  Lab Results:  Recent Labs    08/30/23 0337 08/31/23 0350  WBC 8.4 8.3  HGB 11.4* 10.4*  HCT 34.5* 31.5*  PLT 194 180   BMET Recent Labs    08/30/23 0337 08/31/23 0350  NA 139 139  K 3.5 4.2  CL 107 108  CO2 23 24  GLUCOSE 75 94  BUN 9 8  CREATININE 0.73 0.65  CALCIUM 7.6* 7.9*   PT/INR No results for input(s): LABPROT, INR in the last 72 hours.  Studies/Results: MR ABDOMEN MRCP W WO CONTAST Result Date: 08/30/2023 CLINICAL DATA:  Cholelithiasis and pancreatitis. EXAM: MRI ABDOMEN WITHOUT AND WITH CONTRAST (INCLUDING MRCP) TECHNIQUE: Multiplanar multisequence MR imaging of the abdomen was performed both before and after the administration of intravenous contrast. Heavily T2-weighted images of the biliary and pancreatic ducts were obtained, and three-dimensional MRCP images were rendered by post processing. CONTRAST:  6mL GADAVIST  GADOBUTROL  1 MMOL/ML IV SOLN COMPARISON:  Abdominal ultrasound and CT of 1 day prior  FINDINGS: Portions of exam are mildly motion degraded. Lower chest: Normal heart size.  Small bilateral pleural effusions. Hepatobiliary: Normal liver. Tiny dependent gallstones including on 25/4. Gallbladder wall thickening is mild, including at 5 mm. No specific evidence of acute cholecystitis. No intra or extrahepatic biliary duct dilatation. The common duct measures 4 mm on 28/11. No choledocholithiasis. Pancreas: There is minimal edema/fluid in the peripancreatic space, including adjacent the tail on 11/07. No well-defined peripancreatic fluid collection or evidence of pancreatic necrosis. No duct dilatation. Spleen:  Normal in size, without focal abnormality. Adrenals/Urinary Tract: Normal adrenal glands. Normal kidneys, without hydronephrosis. Stomach/Bowel: Status post Roux-en-Y gastric bypass, suboptimally evaluated. Colonic stool burden suggests constipation. Vascular/Lymphatic: Aortic atherosclerosis. No retroperitoneal or retrocrural adenopathy. Other:  Trace perihepatic ascites.  Diffuse anasarca. Musculoskeletal: No acute osseous abnormality. Note is made of small bilateral breast cysts. IMPRESSION: 1. Mildly motion degraded exam. 2. Cholelithiasis with mild gallbladder wall thickening. No specific evidence of acute cholecystitis. 3. No biliary duct dilatation or choledocholithiasis. 4. Trace peripancreatic edema/fluid is nonspecific in the setting of pleural fluid and anasarca. Possibly related to pancreatitis or simply fluid overload. 5.  Possible constipation. Electronically Signed   By: Rockey Kilts M.D.   On: 08/30/2023 08:29   MR 3D Recon At Scanner Result Date: 08/30/2023 CLINICAL DATA:  Cholelithiasis and pancreatitis. EXAM: MRI ABDOMEN WITHOUT AND WITH CONTRAST (INCLUDING MRCP) TECHNIQUE: Multiplanar multisequence MR imaging of the abdomen was performed both before and after the administration of intravenous contrast. Heavily T2-weighted images of the biliary and pancreatic ducts were  obtained, and three-dimensional MRCP images were rendered  by post processing. CONTRAST:  6mL GADAVIST  GADOBUTROL  1 MMOL/ML IV SOLN COMPARISON:  Abdominal ultrasound and CT of 1 day prior FINDINGS: Portions of exam are mildly motion degraded. Lower chest: Normal heart size.  Small bilateral pleural effusions. Hepatobiliary: Normal liver. Tiny dependent gallstones including on 25/4. Gallbladder wall thickening is mild, including at 5 mm. No specific evidence of acute cholecystitis. No intra or extrahepatic biliary duct dilatation. The common duct measures 4 mm on 28/11. No choledocholithiasis. Pancreas: There is minimal edema/fluid in the peripancreatic space, including adjacent the tail on 11/07. No well-defined peripancreatic fluid collection or evidence of pancreatic necrosis. No duct dilatation. Spleen:  Normal in size, without focal abnormality. Adrenals/Urinary Tract: Normal adrenal glands. Normal kidneys, without hydronephrosis. Stomach/Bowel: Status post Roux-en-Y gastric bypass, suboptimally evaluated. Colonic stool burden suggests constipation. Vascular/Lymphatic: Aortic atherosclerosis. No retroperitoneal or retrocrural adenopathy. Other:  Trace perihepatic ascites.  Diffuse anasarca. Musculoskeletal: No acute osseous abnormality. Note is made of small bilateral breast cysts. IMPRESSION: 1. Mildly motion degraded exam. 2. Cholelithiasis with mild gallbladder wall thickening. No specific evidence of acute cholecystitis. 3. No biliary duct dilatation or choledocholithiasis. 4. Trace peripancreatic edema/fluid is nonspecific in the setting of pleural fluid and anasarca. Possibly related to pancreatitis or simply fluid overload. 5.  Possible constipation. Electronically Signed   By: Rockey Kilts M.D.   On: 08/30/2023 08:29    Anti-infectives: Anti-infectives (From admission, onward)    Start     Dose/Rate Route Frequency Ordered Stop   08/30/23 0915  cefoTEtan  (CEFOTAN ) 2 g in sodium chloride  0.9 % 100  mL IVPB        2 g 200 mL/hr over 30 Minutes Intravenous On call to O.R. 08/30/23 0845 08/30/23 1100   08/30/23 0846  sodium chloride  0.9 % with cefoTEtan  (CEFOTAN ) ADS Med       Note to Pharmacy: Romona Shaver E: cabinet override      08/30/23 0846 08/30/23 0957       Assessment/Plan:  Patient is a 53 year old female who was admitted with gallstone pancreatitis.  She is status post robotic assisted laparoscopic cholecystectomy on 8/29.  -Patient still without leukocytosis today.  LFTs all downtrending and T. bili has normalized -Tolerated a diet without nausea and vomiting -PRN pain control and antiemetics -Patient stable for discharge home from a general surgery standpoint -I will have a phone follow-up with the patient in 2 weeks   LOS: 2 days    Shelby Anderle A Kirsten Spearing 08/31/2023  Note: Portions of this report may have been transcribed using voice recognition software. Every effort has been made to ensure accuracy; however, inadvertent computerized transcription errors may still be present.

## 2023-08-31 NOTE — Plan of Care (Signed)

## 2023-09-03 ENCOUNTER — Telehealth: Payer: Self-pay

## 2023-09-03 LAB — SURGICAL PATHOLOGY

## 2023-09-03 MED ORDER — SUGAMMADEX SODIUM 200 MG/2ML IV SOLN
INTRAVENOUS | Status: DC | PRN
  Administered 2023-08-30: 200 mg via INTRAVENOUS

## 2023-09-03 NOTE — Telephone Encounter (Signed)
 Ok thx.

## 2023-09-03 NOTE — Telephone Encounter (Signed)
 [facility] has submitted for clearance from general surgery due to recent, emergent, cholecystectomy.  Patient still wishes to proceed with surgery but is aware we must get this clearance first, per the [facility].

## 2023-09-03 NOTE — Addendum Note (Signed)
 Addendum  created 09/03/23 1201 by Elaine Delon CROME, CRNA   Intraprocedure Meds edited

## 2023-09-04 ENCOUNTER — Encounter: Payer: Self-pay | Admitting: Psychiatry

## 2023-09-04 ENCOUNTER — Other Ambulatory Visit (HOSPITAL_COMMUNITY): Payer: Self-pay

## 2023-09-04 ENCOUNTER — Telehealth (INDEPENDENT_AMBULATORY_CARE_PROVIDER_SITE_OTHER): Admitting: Psychiatry

## 2023-09-04 DIAGNOSIS — F3341 Major depressive disorder, recurrent, in partial remission: Secondary | ICD-10-CM

## 2023-09-04 DIAGNOSIS — G4700 Insomnia, unspecified: Secondary | ICD-10-CM

## 2023-09-04 DIAGNOSIS — F411 Generalized anxiety disorder: Secondary | ICD-10-CM | POA: Diagnosis not present

## 2023-09-04 MED ORDER — RAMELTEON 8 MG PO TABS
8.0000 mg | ORAL_TABLET | Freq: Every day | ORAL | 0 refills | Status: DC
  Filled 2023-09-04 – 2023-09-20 (×5): qty 30, 30d supply, fill #0

## 2023-09-04 NOTE — Telephone Encounter (Signed)
 She is able to schedule an appointment today. Will plan to address this.

## 2023-09-04 NOTE — Patient Instructions (Signed)
 Continue lamotrigine  200 mg daily   Continue Buspar  15 mg three times a day  Start ramelton 8 mg at night as needed for insomnia Hold prazosin   Referred to [facility] at [facility]   Next appointment: 9/25 at 11:30
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
## 2023-09-05 ENCOUNTER — Other Ambulatory Visit (HOSPITAL_COMMUNITY): Payer: Self-pay

## 2023-09-05 ENCOUNTER — Encounter (HOSPITAL_COMMUNITY): Payer: Self-pay

## 2023-09-05 ENCOUNTER — Telehealth: Payer: Self-pay

## 2023-09-05 NOTE — Telephone Encounter (Signed)
 received fax that a prior auth needed for the ramelteon .

## 2023-09-05 NOTE — Telephone Encounter (Signed)
 went online to [email or web address] and submitted the prior auth . - pending

## 2023-09-05 NOTE — Telephone Encounter (Signed)
 Surgical Date: 09/09/2023 Procedure: Trigger Finger Release, Thumb, Right  Provider: Cordella Hutchinson, MD Anesthesia: [facility])  Received fax from: [facility] [phone]~ telephone [phone]~ fax   Requested CLEARANCE FROM GENERAL SURGERY OR GI- HAD ACUTE PANCREATITIS FOLLOWED BY GALLBLADDER SURGERY 08/30/2023 per Dr. Fredricka.  Discussed with Dr. Evonnie. Recommendations are as follows: -Patient is ok for scheduled surgery from general surgery standpoint.  -If there is a concern regarding pancreatitis, will need to reach out to PCP as this was managed by hospitalist while inpatient.   Faxed to [facility] at [place].

## 2023-09-05 NOTE — Telephone Encounter (Signed)
 Ok thx she is good to go

## 2023-09-05 NOTE — Telephone Encounter (Signed)
 received letter from [facility] that they were not ablet to further process PA. pt has alternative pharmacy benefits and pharmacy needs to bill the other insurance 1st.

## 2023-09-05 NOTE — Telephone Encounter (Signed)
 The pharmacy was faxed and confirmed the notices

## 2023-09-09 ENCOUNTER — Telehealth (HOSPITAL_COMMUNITY): Payer: Self-pay | Admitting: Pharmacy Technician

## 2023-09-09 ENCOUNTER — Encounter (HOSPITAL_COMMUNITY): Payer: Self-pay

## 2023-09-09 ENCOUNTER — Other Ambulatory Visit (HOSPITAL_COMMUNITY): Payer: Self-pay

## 2023-09-09 ENCOUNTER — Other Ambulatory Visit: Payer: Self-pay

## 2023-09-09 ENCOUNTER — Ambulatory Visit (INDEPENDENT_AMBULATORY_CARE_PROVIDER_SITE_OTHER): Admitting: Family Medicine

## 2023-09-09 NOTE — Telephone Encounter (Signed)
 Could you find out what medication is covered by her insurance? thanks

## 2023-09-10 ENCOUNTER — Other Ambulatory Visit (HOSPITAL_COMMUNITY): Payer: Self-pay

## 2023-09-10 ENCOUNTER — Telehealth (HOSPITAL_COMMUNITY): Payer: Self-pay

## 2023-09-10 NOTE — Telephone Encounter (Signed)
 PA request has been Received. New Encounter has been or will be created for follow up. For additional info see Pharmacy Prior Auth telephone encounter from 09/10/23.

## 2023-09-10 NOTE — Telephone Encounter (Signed)
 Pharmacy Patient Advocate Encounter   Received notification from Pt Calls Messages that prior authorization for Ramelteon  8 mg tablets is required/requested.   Insurance verification completed.   The patient is insured through [facility] .   Per test claim:  Zolpidem, Zolpidem ER, Zaleplon or Eszopiclone is preferred by the insurance.  If suggested medication is appropriate, Please send in a new RX and discontinue this one. If not, please advise as to why it's not appropriate so that we may request a Prior Authorization. Please note, some preferred medications may still require a PA.  If the suggested medications have not been trialed and there are no contraindications to their use, the PA will not be submitted, as it will not be approved.

## 2023-09-10 NOTE — Telephone Encounter (Signed)
 Pharmacy Patient Advocate Encounter   Received notification from Pt Calls Messages that prior authorization for Ramelteon  8 mg tablets  is required/requested.   Insurance verification completed.   The patient is insured through [facility] .   Per test claim: PA required; PA submitted to above mentioned insurance via Latent Key/confirmation #/[id number] Status is pending

## 2023-09-10 NOTE — Telephone Encounter (Signed)
 Use of Zolpidem, Zolpidem ER, Zaleplon, or Eszopiclone is not appropriate due to concerns about the risk of dependency.

## 2023-09-11 ENCOUNTER — Encounter (HOSPITAL_COMMUNITY): Payer: Self-pay

## 2023-09-11 ENCOUNTER — Other Ambulatory Visit: Payer: Self-pay

## 2023-09-11 ENCOUNTER — Other Ambulatory Visit (HOSPITAL_COMMUNITY): Payer: Self-pay

## 2023-09-12 ENCOUNTER — Ambulatory Visit (INDEPENDENT_AMBULATORY_CARE_PROVIDER_SITE_OTHER): Admitting: Nurse Practitioner

## 2023-09-12 ENCOUNTER — Ambulatory Visit (INDEPENDENT_AMBULATORY_CARE_PROVIDER_SITE_OTHER): Admitting: Surgery

## 2023-09-12 DIAGNOSIS — Z09 Encounter for follow-up examination after completed treatment for conditions other than malignant neoplasm: Secondary | ICD-10-CM

## 2023-09-12 NOTE — Progress Notes (Signed)
 [facility]  I am calling the patient for post operative evaluation. This is not a billable encounter as it is under the global charges for the surgery.  The patient had a robotic assisted laparoscopic cholecystectomy on 8/29. The patient did not answer, so voicemail was left.  Pathology: A. GALLBLADDER, CHOLECYSTECTOMY:       Chronic cholecystitis.       Cholelithiasis.   Will see the patient PRN.   Dorothyann Brittle, DO [facility] [address] [place] [address], [place] [address] [phone] (office)

## 2023-09-12 NOTE — Telephone Encounter (Signed)
 Pharmacy Patient Advocate Encounter  Received notification from [facility] that Prior Authorization for Ramelteon  8mg  tabs has been DENIED.  Full denial letter will be uploaded to the media tab. See denial reason below.

## 2023-09-16 ENCOUNTER — Other Ambulatory Visit: Payer: Self-pay | Admitting: Adult Health

## 2023-09-16 ENCOUNTER — Other Ambulatory Visit: Payer: Self-pay

## 2023-09-16 ENCOUNTER — Other Ambulatory Visit (HOSPITAL_COMMUNITY): Payer: Self-pay

## 2023-09-16 MED ORDER — LEVETIRACETAM 750 MG PO TABS
750.0000 mg | ORAL_TABLET | Freq: Two times a day (BID) | ORAL | 0 refills | Status: DC
  Filled 2023-09-16: qty 24, 12d supply, fill #0

## 2023-09-16 MED ORDER — GLYCOPYRROLATE 0.2 MG/ML IJ SOLN
INTRAMUSCULAR | Status: DC | PRN
  Administered 2023-08-30: .2 mg via INTRAVENOUS

## 2023-09-16 NOTE — Telephone Encounter (Signed)
 last office visit was on 10/08/22 per patient's last visit return in 3 to 4 months with Dr. Chalice - 01/10/23 and 02/18/23 office visit was a no show  Continue on Keppra  750 mg twice a day last filled 08/20/23 -patient is due for an office visit    2 week refill has been sent in and mychart message sent to the patient

## 2023-09-16 NOTE — Addendum Note (Signed)
 Addendum  created 09/16/23 0901 by Pheobe Adine CROME, CRNA   Intraprocedure Meds edited

## 2023-09-17 ENCOUNTER — Other Ambulatory Visit: Payer: Self-pay

## 2023-09-18 ENCOUNTER — Encounter: Admitting: Orthopedic Surgery

## 2023-09-20 ENCOUNTER — Other Ambulatory Visit (HOSPITAL_COMMUNITY): Payer: Self-pay
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
## 2023-09-23 ENCOUNTER — Telehealth: Payer: Self-pay

## 2023-09-23 DIAGNOSIS — G4700 Insomnia, unspecified: Secondary | ICD-10-CM

## 2023-09-23 NOTE — Telephone Encounter (Signed)
 Received fax stating that the patients Ramelteon  8 mg tablet was denied the medication is a non preferred insomnia agent and requires step therapy to meet the criteria  the patient must try one of the following preferred drugs: zolpidem, zolpidem er, zaleplon,or eszopiclone 

## 2023-09-24 ENCOUNTER — Other Ambulatory Visit: Payer: Self-pay

## 2023-09-24 ENCOUNTER — Other Ambulatory Visit (HOSPITAL_COMMUNITY): Payer: Self-pay

## 2023-09-24 MED ORDER — ESZOPICLONE 1 MG PO TABS
1.0000 mg | ORAL_TABLET | Freq: Every evening | ORAL | 0 refills | Status: DC | PRN
  Filled 2023-09-24: qty 15, 15d supply, fill #0

## 2023-09-24 NOTE — Telephone Encounter (Signed)
 Called patient to make aware of the limited supply sent to the pharmacy no answer left voicemail for patient to return call to office

## 2023-09-24 NOTE — Telephone Encounter (Signed)
 Could you ask the patient if she would be willing to try a short course of eszopiclone  to help with sleep? Possible side effects include drowsiness and dizziness. The plan would be to use it for no longer than two months to minimize any risk of dependence.

## 2023-09-24 NOTE — Addendum Note (Signed)
 Addended byBETHA COBY HEIGHT on: 09/24/2023 02:33 PM   Modules accepted: Orders

## 2023-09-24 NOTE — Telephone Encounter (Signed)
 Spoke to patient she stated that she would be willing to try the Eszopiclone  please advise

## 2023-09-24 NOTE — Telephone Encounter (Signed)
 I have sent eszopiclone  1 mg-limited supply to pharmacy as requested.

## 2023-09-25 ENCOUNTER — Other Ambulatory Visit (HOSPITAL_COMMUNITY): Payer: Self-pay

## 2023-09-26 ENCOUNTER — Other Ambulatory Visit (HOSPITAL_COMMUNITY): Payer: Self-pay

## 2023-09-26 ENCOUNTER — Other Ambulatory Visit: Payer: Self-pay

## 2023-09-26 ENCOUNTER — Encounter: Payer: Self-pay | Admitting: Psychiatry

## 2023-09-26 ENCOUNTER — Telehealth: Admitting: Psychiatry

## 2023-09-26 ENCOUNTER — Other Ambulatory Visit: Payer: Self-pay | Admitting: Adult Health

## 2023-09-26 DIAGNOSIS — F411 Generalized anxiety disorder: Secondary | ICD-10-CM

## 2023-09-26 DIAGNOSIS — G4700 Insomnia, unspecified: Secondary | ICD-10-CM | POA: Diagnosis not present

## 2023-09-26 DIAGNOSIS — F3341 Major depressive disorder, recurrent, in partial remission: Secondary | ICD-10-CM | POA: Diagnosis not present

## 2023-09-26 MED ORDER — LEVETIRACETAM 750 MG PO TABS
750.0000 mg | ORAL_TABLET | Freq: Two times a day (BID) | ORAL | 0 refills | Status: DC
  Filled 2023-09-26: qty 60, 30d supply, fill #0

## 2023-09-26 MED ORDER — ESZOPICLONE 1 MG PO TABS
1.0000 mg | ORAL_TABLET | Freq: Every evening | ORAL | 1 refills | Status: DC | PRN
  Filled 2023-09-26 – 2023-10-11 (×2): qty 30, 30d supply, fill #0
  Filled 2023-11-18: qty 30, 30d supply, fill #1

## 2023-09-26 MED ORDER — LAMOTRIGINE 100 MG PO TABS
200.0000 mg | ORAL_TABLET | Freq: Every day | ORAL | 0 refills | Status: AC
  Filled 2023-11-18: qty 180, 90d supply, fill #0

## 2023-09-26 NOTE — Patient Instructions (Signed)
 Continue lamotrigine  200 mg daily   Continue Buspar  15 mg three times a day  Continue lunesta  1 mg at night as needed for insomnia Referred to therapy at [facility]  (or [facility] [phone]) Next appointment: 11/25 at 11:30

## 2023-09-27 ENCOUNTER — Other Ambulatory Visit: Payer: Self-pay

## 2023-09-29 ENCOUNTER — Encounter (INDEPENDENT_AMBULATORY_CARE_PROVIDER_SITE_OTHER): Payer: Self-pay | Admitting: Family Medicine

## 2023-09-30 ENCOUNTER — Encounter (INDEPENDENT_AMBULATORY_CARE_PROVIDER_SITE_OTHER): Payer: Self-pay | Admitting: Family Medicine

## 2023-09-30 ENCOUNTER — Other Ambulatory Visit: Payer: Self-pay | Admitting: Medical Genetics

## 2023-09-30 ENCOUNTER — Telehealth (INDEPENDENT_AMBULATORY_CARE_PROVIDER_SITE_OTHER): Admitting: Family Medicine

## 2023-09-30 ENCOUNTER — Other Ambulatory Visit (HOSPITAL_COMMUNITY): Payer: Self-pay

## 2023-09-30 ENCOUNTER — Other Ambulatory Visit: Payer: Self-pay

## 2023-09-30 ENCOUNTER — Encounter (INDEPENDENT_AMBULATORY_CARE_PROVIDER_SITE_OTHER): Payer: Self-pay

## 2023-09-30 ENCOUNTER — Other Ambulatory Visit (INDEPENDENT_AMBULATORY_CARE_PROVIDER_SITE_OTHER): Payer: Self-pay | Admitting: Family Medicine

## 2023-09-30 VITALS — Ht 62.0 in | Wt 129.0 lb

## 2023-09-30 DIAGNOSIS — Z9884 Bariatric surgery status: Secondary | ICD-10-CM | POA: Diagnosis not present

## 2023-09-30 DIAGNOSIS — Z7985 Long-term (current) use of injectable non-insulin antidiabetic drugs: Secondary | ICD-10-CM

## 2023-09-30 DIAGNOSIS — E119 Type 2 diabetes mellitus without complications: Secondary | ICD-10-CM

## 2023-09-30 DIAGNOSIS — E669 Obesity, unspecified: Secondary | ICD-10-CM

## 2023-09-30 DIAGNOSIS — Z6823 Body mass index (BMI) 23.0-23.9, adult: Secondary | ICD-10-CM | POA: Diagnosis not present

## 2023-09-30 DIAGNOSIS — E1169 Type 2 diabetes mellitus with other specified complication: Secondary | ICD-10-CM

## 2023-09-30 DIAGNOSIS — E66812 Obesity, class 2: Secondary | ICD-10-CM

## 2023-09-30 MED ORDER — BLOOD GLUCOSE TEST VI STRP
1.0000 | ORAL_STRIP | Freq: Three times a day (TID) | 0 refills | Status: AC
  Filled 2023-09-30: qty 100, 30d supply, fill #0

## 2023-09-30 MED ORDER — BLOOD GLUCOSE MONITOR SYSTEM W/DEVICE KIT
1.0000 | PACK | Freq: Three times a day (TID) | 0 refills | Status: AC
  Filled 2023-09-30: qty 1, 30d supply, fill #0

## 2023-09-30 MED ORDER — TRULICITY 1.5 MG/0.5ML ~~LOC~~ SOAJ
1.5000 mg | SUBCUTANEOUS | 0 refills | Status: DC
  Filled 2023-09-30: qty 2, 28d supply, fill #0
  Filled 2023-11-18: qty 2, 28d supply, fill #1
  Filled 2023-12-16: qty 2, 28d supply, fill #2

## 2023-09-30 MED ORDER — LANCETS MISC
1.0000 | Freq: Three times a day (TID) | 0 refills | Status: AC
  Filled 2023-09-30: qty 102, 33d supply, fill #0

## 2023-09-30 MED ORDER — LANCET DEVICE MISC
1.0000 | Freq: Three times a day (TID) | 0 refills | Status: AC
  Filled 2023-09-30 – 2023-10-29 (×3): qty 1, 30d supply, fill #0

## 2023-09-30 NOTE — Progress Notes (Signed)
 Office: [phone]  /  Fax: [phone]  WEIGHT SUMMARY AND BIOMETRICS  Anthropometric Measurements Height: 5' 2 (1.575 m) Weight: 129 lb (58.5 kg) (last ov weight) BMI (Calculated): 23.59 Starting Weight: 217 lb Total Weight Loss (lbs): 88 lb (39.9 kg) Waist Measurement : 46 inches   No data recorded Other Clinical Data Fasting: no Labs: no Today's Visit #: 36 Starting Date: 09/14/19 Comments: my chart video visit    Chief Complaint: OBESITY  Virtual Visit via A/V Note  I connected with Brooke [name] on 09/30/23 at 12:00 PM EDT by audiovisual telehealth and verified that I am speaking with the correct person using two identifiers.  Location: Patient: work Provider: ho,e   I discussed the limitations, risks, security and privacy concerns of performing an evaluation and management service by AV telehealth and the availability of in person appointments. I also discussed with the patient that there may be a patient responsible charge related to this service. The patient expressed understanding and agreed to proceed.    History of Present Illness Brooke [name] is a 53 year old female with obesity and type 2 diabetes who presents for a virtual follow-up on her obesity treatment plan and progress assessment.  She adheres to a pescetarian eating plan approximately 75% of the time and is working on increasing her intake of fruits, vegetables, and water . She engages in physical activity by walking for 60 minutes two days a week. She struggles with meeting her protein goals and finds meal planning and preparation challenging, although she attempts to do it most of the time. Her weight has remained stable since her last visit three months ago, and she is currently in the maintenance phase of her weight loss with a BMI of 23.59.  She is being treated for type 2 diabetes with Trulicity  1.5 mg. She requests new glucometer strips and lancets to help test her glucose levels. Her  most recent hemoglobin A1c, done in April of this year, was well controlled at 4.9. She experienced an episode last week where she woke up sweating, nauseous, and felt the need to eat or drink something, although she did not check her blood sugar level at that time. She experiences these symptoms quite often, at any time of the day, and is unsure if she is hypoglycemic.  She underwent gallbladder surgery at the end of August after experiencing severe pain at home, which she initially thought was trapped gas. She went to the emergency room and was diagnosed with an inflamed gallbladder and pancreatitis. She reports still having some pain and issues with gas, which she manages with medication. She has been doing well avoiding high-fat foods since the surgery.      PHYSICAL EXAM:  Height 5' 2 (1.575 m), weight 129 lb (58.5 kg), last menstrual period 09/01/2020. Body mass index is 23.59 kg/m.  DIAGNOSTIC DATA REVIEWED:  BMET    Component Value Date/Time   NA 139 08/31/2023 0350   NA 138 04/29/2023 0756   K 4.2 08/31/2023 0350   CL 108 08/31/2023 0350   CO2 24 08/31/2023 0350   GLUCOSE 94 08/31/2023 0350   BUN 8 08/31/2023 0350   BUN 9 04/29/2023 0756   CREATININE 0.65 08/31/2023 0350   CREATININE 1.00 08/05/2019 0904   CALCIUM 7.9 (L) 08/31/2023 0350   GFRNONAA >60 08/31/2023 0350   GFRNONAA 66 08/05/2019 0904   GFRAA 94 02/22/2020 0839   GFRAA 77 08/05/2019 0904   Lab Results  Component Value Date  HGBA1C 4.9 04/29/2023   HGBA1C 5.3 08/05/2019   Lab Results  Component Value Date   INSULIN  3.0 04/29/2023   INSULIN  5.9 09/14/2019   Lab Results  Component Value Date   TSH 2.060 04/29/2023   CBC    Component Value Date/Time   WBC 8.3 08/31/2023 0350   RBC 3.32 (L) 08/31/2023 0350   HGB 10.4 (L) 08/31/2023 0350   HGB 12.4 04/29/2023 0756   HCT 31.5 (L) 08/31/2023 0350   HCT 38.1 04/29/2023 0756   PLT 180 08/31/2023 0350   PLT 275 04/29/2023 0756   MCV 94.9  08/31/2023 0350   MCV 96 04/29/2023 0756   MCH 31.3 08/31/2023 0350   MCHC 33.0 08/31/2023 0350   RDW 13.9 08/31/2023 0350   RDW 12.7 04/29/2023 0756   Iron Studies    Component Value Date/Time   IRON 80 04/29/2023 0756   TIBC 262 04/29/2023 0756   FERRITIN 158 (H) 04/29/2023 0756   IRONPCTSAT 31 04/29/2023 0756   IRONPCTSAT 38 09/03/2019 0809   Lipid Panel     Component Value Date/Time   CHOL 171 04/29/2023 0756   TRIG 41 04/29/2023 0756   HDL 65 04/29/2023 0756   CHOLHDL 3.0 03/24/2020 1235   CHOLHDL 3.4 08/05/2019 0904   VLDL 13 05/13/2012 1737   LDLCALC 97 04/29/2023 0756   LDLCALC 127 (H) 08/05/2019 0904   Hepatic Function Panel     Component Value Date/Time   PROT 4.9 (L) 08/31/2023 0350   PROT 6.1 04/29/2023 0756   ALBUMIN 2.4 (L) 08/31/2023 0350   ALBUMIN 4.1 04/29/2023 0756   AST 251 (H) 08/31/2023 0350   ALT 573 (H) 08/31/2023 0350   ALKPHOS 92 08/31/2023 0350   BILITOT 0.9 08/31/2023 0350   BILITOT 0.3 04/29/2023 0756   BILIDIR 0.1 07/16/2017 0640   IBILI 0.6 07/16/2017 0640      Component Value Date/Time   TSH 2.060 04/29/2023 0756   Nutritional Lab Results  Component Value Date   VD25OH 46.1 04/29/2023   VD25OH 59.0 10/11/2022   VD25OH 41.5 01/16/2022     Assessment and Plan Assessment & Plan Obesity, in maintenence Obesity is in the maintenance phase with a BMI of 23.59, indicating successful weight management. She follows a pescetarian diet 75% of the time and engages in physical activity by walking 60 minutes two days a week. She is working on increasing fruit and vegetable intake and drinking more water . Challenges include meeting protein goals and meal planning/prepping. - Continue current pescetarian diet and exercise regimen - Encourage increased intake of protein-rich foods such as beans and kale - Advise on meal planning and prepping, especially using soups, stews, and chili for protein intake - Schedule next visit before  Christmas  Type 2 diabetes mellitus Type 2 diabetes is well-controlled with a recent hemoglobin A1c of 4.9. She is on Trulicity  1.5 mg. Reports episodes of sweating, nausea, and feeling the need to eat or drink, suggestive of hypoglycemia or rapid blood sugar drops. These episodes occur at any time, not just at night. Trulicity  should not cause hypoglycemia, but prolonged periods without eating may contribute to symptoms. - Prescribe glucometer, strips, and lancets for blood glucose monitoring - Advise on eating every 5 hours to prevent hypoglycemia - Recommend a bedtime snack with protein to stabilize blood sugar levels  History of cholecystectomy with recent pancreatitis and ongoing gastrointestinal symptoms Recent cholecystectomy in August due to gallbladder inflammation and pancreatitis. She reports ongoing gastrointestinal symptoms, including gas and  pain. - Continue to avoid high-fat foods to manage gastrointestinal symptoms      Brooke [name] was informed of the importance of frequent follow up visits to maximize her success with intensive lifestyle modifications for her obesity and obesity related health conditions as recommended by USPSTF and CMS guidelines   Louann Penton, MD

## 2023-10-07 ENCOUNTER — Other Ambulatory Visit: Payer: Self-pay | Admitting: Surgical

## 2023-10-07 DIAGNOSIS — M65311 Trigger thumb, right thumb: Secondary | ICD-10-CM | POA: Diagnosis not present

## 2023-10-07 MED ORDER — TRAMADOL HCL 50 MG PO TABS: 50.0000 mg | ORAL_TABLET | Freq: Four times a day (QID) | ORAL | 0 refills | Status: DC | PRN

## 2023-10-09 ENCOUNTER — Ambulatory Visit: Admitting: Neurology

## 2023-10-09 ENCOUNTER — Telehealth: Payer: Self-pay

## 2023-10-09 NOTE — Progress Notes (Deleted)
 No show today, 10-09-2023,  to yearly visit,  ambulatory EEG had not been done .  Refills of medication ( Keppra  and Lamictal )  can only be provided for 30 days or until a new appointment is scheduled ASAP.  These medications may be provided by PCP.    Dedra Gores, MD  [facility] Board certified by The [employer] of Sleep Medicine and Diplomate of the [employer] of [employer]. Board certified In Neurology through the ABPN, Fellow of the [employer] of [employer].

## 2023-10-09 NOTE — Telephone Encounter (Signed)
 Patient called stating that bandage is staring to lift up  on right hand and would like to know what she needs to do.  Cb# [phone].  Please advise.  Thank you.

## 2023-10-10 ENCOUNTER — Other Ambulatory Visit (HOSPITAL_COMMUNITY)

## 2023-10-10 NOTE — Telephone Encounter (Signed)
 Bandaid, clear tape or Tegaderm should be fine

## 2023-10-10 NOTE — Telephone Encounter (Signed)
 Called and advised pt.

## 2023-10-11 ENCOUNTER — Other Ambulatory Visit: Payer: Self-pay

## 2023-10-11 ENCOUNTER — Other Ambulatory Visit (HOSPITAL_COMMUNITY): Payer: Self-pay

## 2023-10-16 ENCOUNTER — Encounter: Payer: Self-pay | Admitting: Orthopedic Surgery

## 2023-10-16 ENCOUNTER — Ambulatory Visit (INDEPENDENT_AMBULATORY_CARE_PROVIDER_SITE_OTHER): Admitting: Orthopedic Surgery

## 2023-10-16 DIAGNOSIS — M65311 Trigger thumb, right thumb: Secondary | ICD-10-CM
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
## 2023-10-17 ENCOUNTER — Encounter: Payer: Self-pay | Admitting: Orthopedic Surgery

## 2023-10-23 ENCOUNTER — Other Ambulatory Visit (HOSPITAL_COMMUNITY): Payer: Self-pay

## 2023-10-23 ENCOUNTER — Encounter: Payer: Self-pay | Admitting: Orthopedic Surgery

## 2023-10-23 ENCOUNTER — Ambulatory Visit (INDEPENDENT_AMBULATORY_CARE_PROVIDER_SITE_OTHER): Admitting: Orthopedic Surgery

## 2023-10-23 DIAGNOSIS — M65311 Trigger thumb, right thumb: Secondary | ICD-10-CM
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
## 2023-10-24 ENCOUNTER — Other Ambulatory Visit: Payer: Self-pay

## 2023-10-24 ENCOUNTER — Other Ambulatory Visit (HOSPITAL_COMMUNITY): Payer: Self-pay

## 2023-10-28 ENCOUNTER — Other Ambulatory Visit (HOSPITAL_COMMUNITY)

## 2023-10-29 ENCOUNTER — Other Ambulatory Visit: Payer: Self-pay

## 2023-10-29 ENCOUNTER — Other Ambulatory Visit: Payer: Self-pay | Admitting: Adult Health

## 2023-10-29 ENCOUNTER — Other Ambulatory Visit (HOSPITAL_COMMUNITY): Payer: Self-pay

## 2023-10-29 MED ORDER — LEVETIRACETAM 750 MG PO TABS
750.0000 mg | ORAL_TABLET | Freq: Two times a day (BID) | ORAL | 0 refills | Status: DC
  Filled 2023-10-29: qty 60, 30d supply, fill #0

## 2023-10-29 NOTE — Telephone Encounter (Signed)
 PATIENT HAS NO-SHOWED LAST 2 SCHEDULED OVs: 02/18/23 10/09/23  DOES NOT HAVE FUTURE OV SCHEDULED  MyChart message sent to patient to advise her to schedule OV ASAP.  Please contact to schedule. Thank you!

## 2023-10-30 ENCOUNTER — Other Ambulatory Visit: Payer: Self-pay

## 2023-11-04 ENCOUNTER — Encounter: Payer: Self-pay | Admitting: Radiology

## 2023-11-04 ENCOUNTER — Other Ambulatory Visit (HOSPITAL_COMMUNITY)

## 2023-11-13 ENCOUNTER — Encounter: Admitting: Orthopedic Surgery

## 2023-11-18 ENCOUNTER — Other Ambulatory Visit: Payer: Self-pay

## 2023-11-18 ENCOUNTER — Other Ambulatory Visit: Payer: Self-pay | Admitting: Internal Medicine

## 2023-11-18 ENCOUNTER — Other Ambulatory Visit (HOSPITAL_COMMUNITY): Payer: Self-pay

## 2023-11-18 MED ORDER — PANTOPRAZOLE SODIUM 40 MG PO TBEC
40.0000 mg | DELAYED_RELEASE_TABLET | Freq: Two times a day (BID) | ORAL | 2 refills | Status: AC
  Filled 2023-11-18 – 2023-11-20 (×2): qty 60, 30d supply, fill #0
  Filled 2023-12-20: qty 60, 30d supply, fill #1
  Filled 2024-01-20: qty 60, 30d supply, fill #2

## 2023-11-20 ENCOUNTER — Other Ambulatory Visit (HOSPITAL_COMMUNITY): Payer: Self-pay

## 2023-11-20 ENCOUNTER — Other Ambulatory Visit: Payer: Self-pay
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
## 2023-11-26 ENCOUNTER — Other Ambulatory Visit (HOSPITAL_COMMUNITY): Payer: Self-pay

## 2023-11-26 ENCOUNTER — Telehealth: Admitting: Psychiatry

## 2023-11-26 ENCOUNTER — Other Ambulatory Visit: Payer: Self-pay

## 2023-11-26 ENCOUNTER — Encounter: Payer: Self-pay | Admitting: Psychiatry

## 2023-11-26 DIAGNOSIS — F330 Major depressive disorder, recurrent, mild: Secondary | ICD-10-CM | POA: Diagnosis not present

## 2023-11-26 DIAGNOSIS — G4700 Insomnia, unspecified: Secondary | ICD-10-CM

## 2023-11-26 DIAGNOSIS — F411 Generalized anxiety disorder: Secondary | ICD-10-CM

## 2023-11-26 MED ORDER — LEMBOREXANT 5 MG PO TABS
5.0000 mg | ORAL_TABLET | Freq: Every evening | ORAL | 1 refills | Status: AC | PRN
Start: 2023-11-26 — End: 2024-01-25
  Filled 2023-11-26: qty 30, 30d supply, fill #0

## 2023-11-26 NOTE — Patient Instructions (Signed)
 Continue lamotrigine  200 mg daily   Continue Buspar  15 mg three times a day  Discontinue Lunesta   Start lemborexant  5 mg at night as needed for insomnia Referred to therapy at [facility]  Next appointment: 12/31 at 8:40

## 2023-12-09 ENCOUNTER — Ambulatory Visit (INDEPENDENT_AMBULATORY_CARE_PROVIDER_SITE_OTHER): Payer: Self-pay | Admitting: Family Medicine

## 2023-12-09 ENCOUNTER — Encounter (INDEPENDENT_AMBULATORY_CARE_PROVIDER_SITE_OTHER): Payer: Self-pay

## 2023-12-16 ENCOUNTER — Other Ambulatory Visit: Payer: Self-pay

## 2023-12-16 ENCOUNTER — Other Ambulatory Visit (HOSPITAL_COMMUNITY): Payer: Self-pay

## 2023-12-16 ENCOUNTER — Other Ambulatory Visit: Payer: Self-pay | Admitting: Internal Medicine

## 2023-12-16 DIAGNOSIS — Z87898 Personal history of other specified conditions: Secondary | ICD-10-CM

## 2023-12-16 MED ORDER — SCOPOLAMINE 1 MG/3DAYS TD PT72
1.0000 | MEDICATED_PATCH | TRANSDERMAL | 1 refills | Status: AC
  Filled 2023-12-16: qty 10, 30d supply, fill #0
  Filled 2024-01-11 – 2024-01-12 (×2): qty 10, 30d supply, fill #1

## 2023-12-17 ENCOUNTER — Other Ambulatory Visit: Payer: Self-pay

## 2023-12-19 ENCOUNTER — Ambulatory Visit (INDEPENDENT_AMBULATORY_CARE_PROVIDER_SITE_OTHER): Admitting: Family Medicine

## 2023-12-20 ENCOUNTER — Other Ambulatory Visit: Payer: Self-pay | Admitting: Neurology

## 2023-12-20 ENCOUNTER — Other Ambulatory Visit (HOSPITAL_COMMUNITY): Payer: Self-pay

## 2023-12-20 ENCOUNTER — Other Ambulatory Visit: Payer: Self-pay

## 2023-12-20 MED ORDER — LEVETIRACETAM 750 MG PO TABS
750.0000 mg | ORAL_TABLET | Freq: Two times a day (BID) | ORAL | 0 refills | Status: DC
  Filled 2023-12-20: qty 60, 30d supply, fill #0

## 2023-12-20 NOTE — Telephone Encounter (Signed)
 Keppra  refilled for 30 days Pt has f/u 01/27/2024

## 2023-12-23 ENCOUNTER — Other Ambulatory Visit (HOSPITAL_COMMUNITY)
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
## 2023-12-31 ENCOUNTER — Ambulatory Visit
Admission: EM | Admit: 2023-12-31 | Discharge: 2023-12-31 | Disposition: A | Discharge status: Home / Self Care | Attending: Nurse Practitioner | Admitting: Nurse Practitioner

## 2023-12-31 ENCOUNTER — Encounter: Payer: Self-pay | Admitting: Emergency Medicine

## 2023-12-31 DIAGNOSIS — H9203 Otalgia, bilateral: Secondary | ICD-10-CM

## 2023-12-31 DIAGNOSIS — J069 Acute upper respiratory infection, unspecified: Secondary | ICD-10-CM

## 2023-12-31 LAB — POCT INFLUENZA A/B
Influenza A, POC: NEGATIVE
Influenza B, POC: NEGATIVE

## 2023-12-31 LAB — POC SOFIA SARS ANTIGEN FIA: SARS Coronavirus 2 Ag: NEGATIVE

## 2023-12-31 MED ORDER — FLUTICASONE PROPIONATE 50 MCG/ACT NA SUSP: 2.0000 | Freq: Every day | NASAL | 0 refills | Status: AC

## 2023-12-31 MED ORDER — PROMETHAZINE-DM 6.25-15 MG/5ML PO SYRP: 5.0000 mL | ORAL_SOLUTION | Freq: Four times a day (QID) | ORAL | 0 refills | Status: DC | PRN
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
## 2023-12-31 NOTE — Discharge Instructions (Signed)
 The COVID test and flu test were negative. Take medication as prescribed. Increase fluids and allow for plenty of rest. You may take over-the-counter Tylenol  as needed for pain, fever, or general discomfort. Recommend the use of normal saline nasal spray throughout the day for nasal congestion or runny nose. Warm salt water  gargles 3-4 times daily as needed for throat pain or discomfort. You may also find it helpful to use a humidifier in your bedroom at nighttime during sleep and to sleep elevated on pillows while symptoms persist. Apply warm compresses to the ears for pain or discomfort. Symptoms should begin to improve over the next 5 to 7 days.  If symptoms fail to improve, or begin to worsen, you may follow-up in this clinic or with your primary care physician for further evaluation. Follow-up as needed.

## 2023-12-31 NOTE — ED Triage Notes (Signed)
 Pt c/o bilateral ear pain, headaches, congestion, chills, and hot spells for several days. Denies taking anything for symptoms.

## 2024-01-01 ENCOUNTER — Telehealth: Admitting: Psychiatry

## 2024-01-01 ENCOUNTER — Other Ambulatory Visit: Payer: Self-pay | Admitting: Psychiatry

## 2024-01-01 ENCOUNTER — Other Ambulatory Visit (HOSPITAL_COMMUNITY): Payer: Self-pay

## 2024-01-01 ENCOUNTER — Ambulatory Visit: Payer: Self-pay

## 2024-01-01 ENCOUNTER — Other Ambulatory Visit (HOSPITAL_COMMUNITY)

## 2024-01-01 NOTE — Telephone Encounter (Signed)
 FYI Only or Action Required?: FYI only for provider: Home Care.  Patient was last seen in primary care on 09/30/2023 by Verdon Louann BIRCH, MD.  Called Nurse Triage reporting URI.  Symptoms began several days ago.  Interventions attempted: OTC medications: flonase .  Symptoms are: unchanged.  Triage Disposition: Home Care  Patient/caregiver understands and will follow disposition?: Yes  Reason for Disposition  Common cold with no complications  Answer Assessment - Initial Assessment Questions Reports sore throat, nasal congestion, bilateral ear pain, and cough x 3 days. Denies fever. Neg Covid/Flu at [facility] on 12/31/23. Patient has not attempted to take prescribed Promethazine  DM because she is at work and states it makes her sleepy. Is using flonase . States feels worse since yesterday.  1. ONSET: When did the nasal discharge start?      3 days 2. COUGH: Do you have a cough?     Yes 3. RESPIRATORY DISTRESS: Describe your breathing.      Advised to call if develops 4. FEVER: Do you have a fever? If Yes, ask: What is your temperature, how was it measured, and when did it start?     Denies, advised to call if develops 5. OTHER SYMPTOMS: Do you have any other symptoms? (e.g., earache, mouth sores, sore throat, wheezing)     Sore throat, ear pain  Protocols used: Common Cold-A-AH Copied from CRM #[id number]. Topic: Clinical - Red Word Triage >> Jan 01, 2024 10:50 AM Brooke [name] wrote: Red Word that prompted transfer to Nurse Triage: Feel worse than what she did yesterday.. Earache, sore throat, chills, bodyache

## 2024-01-03 ENCOUNTER — Telehealth: Payer: Self-pay

## 2024-01-03 ENCOUNTER — Other Ambulatory Visit (HOSPITAL_COMMUNITY): Payer: Self-pay

## 2024-01-03 NOTE — Telephone Encounter (Signed)
 Pt calling to request Buspar  30 mg three times a day (she breaks in half she takes 15mg ) Pharmacy:[facility] [facility]  Last seen:09/26/23 Next apt: none

## 2024-01-04 ENCOUNTER — Other Ambulatory Visit: Payer: Self-pay | Admitting: Psychiatry

## 2024-01-04 ENCOUNTER — Other Ambulatory Visit (HOSPITAL_COMMUNITY): Payer: Self-pay

## 2024-01-04 MED ORDER — BUSPIRONE HCL 15 MG PO TABS
15.0000 mg | ORAL_TABLET | Freq: Three times a day (TID) | ORAL | 0 refills | Status: AC
  Filled 2024-01-04: qty 90, 30d supply, fill #0

## 2024-01-04 NOTE — Telephone Encounter (Signed)
 Ordered 15 mg tabs as a bridge. Please call to make a follow up appointment. Virtual visit is fine.

## 2024-01-07 ENCOUNTER — Other Ambulatory Visit (INDEPENDENT_AMBULATORY_CARE_PROVIDER_SITE_OTHER): Payer: Self-pay | Admitting: Family Medicine

## 2024-01-07 ENCOUNTER — Other Ambulatory Visit: Payer: Self-pay | Admitting: Medical Genetics

## 2024-01-07 DIAGNOSIS — E1169 Type 2 diabetes mellitus with other specified complication: Secondary | ICD-10-CM

## 2024-01-07 DIAGNOSIS — Z006 Encounter for examination for normal comparison and control in clinical research program: Secondary | ICD-10-CM

## 2024-01-08 ENCOUNTER — Telehealth (HOSPITAL_COMMUNITY): Payer: Self-pay

## 2024-01-08 ENCOUNTER — Other Ambulatory Visit (HOSPITAL_COMMUNITY): Payer: Self-pay

## 2024-01-08 MED ORDER — TRULICITY 1.5 MG/0.5ML ~~LOC~~ SOAJ
1.5000 mg | SUBCUTANEOUS | 0 refills | Status: AC
  Filled 2024-01-08: qty 6, 84d supply, fill #0

## 2024-01-08 NOTE — Telephone Encounter (Signed)
 LAST APPOINTMENT DATE: 09/30/2023 (Video Visit) NEXT APPOINTMENT DATE: 02/04/2024   [facility] - [facility] [address] [place] [address] Phone: [phone] Fax: [phone]  [id number] - [place], [place] - [address] AT [facility] OF [address] [place] [address] Phone: [phone] Fax: [phone]  [facility] [phone] - [place], [place] - [address] AT [facility] [address] [place] [address] Phone: [phone] Fax: [phone]  Patient is requesting a refill of the following medications: Requested Prescriptions   Pending Prescriptions Disp Refills   Dulaglutide  (TRULICITY ) 1.5 MG/0.5ML SOAJ 6 mL 0    Sig: Inject 1.5 mg into the skin once a week.    Date last filled: 09/30/2023 Previously prescribed by Dr Verdon  Lab Results  Component Value Date   HGBA1C 4.9 04/29/2023   HGBA1C 5.1 10/11/2022   HGBA1C 5.1 01/16/2022   Lab Results  Component Value Date   LDLCALC 97 04/29/2023   CREATININE 0.65 08/31/2023   Lab Results  Component Value Date   VD25OH 46.1 04/29/2023   VD25OH 59.0 10/11/2022   VD25OH 41.5 01/16/2022    BP Readings from Last 3 Encounters:  12/31/23 (!) 143/85  08/31/23 115/82  07/01/23 112/64

## 2024-01-08 NOTE — Telephone Encounter (Signed)
 Pharmacy Patient Advocate Encounter   Received notification from Pt Calls Messages that prior authorization for Trulicity  1.5MG /0.5ML auto-injectors  is required/requested.   Insurance verification completed.   The patient is insured through [employer].   Per test claim: PA required; PA submitted to above mentioned insurance via Latent Key/confirmation #/EOC [id number] Status is pending

## 2024-01-09 ENCOUNTER — Other Ambulatory Visit (HOSPITAL_COMMUNITY): Payer: Self-pay

## 2024-01-09 ENCOUNTER — Other Ambulatory Visit: Payer: Self-pay

## 2024-01-09 NOTE — Telephone Encounter (Signed)
 Pharmacy Patient Advocate Encounter  Received notification from [facility] MEDICAID that Prior Authorization for Trulicity  1.5MG /0.5ML auto-injectors  has been APPROVED from 01/08/24 to 01/07/25. Ran test claim, Copay is $4. This test claim was processed through [facility]- copay amounts may vary at other pharmacies due to pharmacy/plan contracts, or as the patient moves through the different stages of their insurance plan.   PA #/Case ID/Reference #: [id number]

## 2024-01-10 ENCOUNTER — Other Ambulatory Visit (HOSPITAL_COMMUNITY): Payer: Self-pay | Admitting: Family Medicine

## 2024-01-10 DIAGNOSIS — Z1231 Encounter for screening mammogram for malignant neoplasm of breast: Secondary | ICD-10-CM

## 2024-01-10 NOTE — Telephone Encounter (Signed)
 Pt called and left a vm stating that she is due for a colonoscopy and is needing to know if she needs to come in or if she needs to fill out a questionnaire and be triaged. Please advise.

## 2024-01-10 NOTE — Telephone Encounter (Signed)
 LMOVM Will send triage to her

## 2024-01-11 ENCOUNTER — Other Ambulatory Visit (HOSPITAL_COMMUNITY): Payer: Self-pay

## 2024-01-12 ENCOUNTER — Other Ambulatory Visit: Payer: Self-pay | Admitting: Adult Health

## 2024-01-13 ENCOUNTER — Other Ambulatory Visit (HOSPITAL_COMMUNITY): Payer: Self-pay | Admitting: Family Medicine

## 2024-01-13 ENCOUNTER — Encounter (HOSPITAL_COMMUNITY): Payer: Self-pay

## 2024-01-13 ENCOUNTER — Ambulatory Visit (HOSPITAL_COMMUNITY)
Admission: RE | Admit: 2024-01-13 | Discharge: 2024-01-13 | Disposition: A | Discharge status: Home / Self Care | Source: Ambulatory Visit

## 2024-01-13 ENCOUNTER — Other Ambulatory Visit: Payer: Self-pay

## 2024-01-13 DIAGNOSIS — N63 Unspecified lump in unspecified breast: Secondary | ICD-10-CM

## 2024-01-13 DIAGNOSIS — Z1231 Encounter for screening mammogram for malignant neoplasm of breast: Secondary | ICD-10-CM

## 2024-01-14 ENCOUNTER — Other Ambulatory Visit (HOSPITAL_COMMUNITY): Payer: Self-pay

## 2024-01-14 ENCOUNTER — Other Ambulatory Visit: Payer: Self-pay

## 2024-01-14 MED ORDER — LEVETIRACETAM 750 MG PO TABS
750.0000 mg | ORAL_TABLET | Freq: Two times a day (BID) | ORAL | 0 refills | Status: AC
  Filled 2024-01-14: qty 60, 30d supply, fill #0

## 2024-01-21 ENCOUNTER — Other Ambulatory Visit (HOSPITAL_COMMUNITY): Payer: Self-pay

## 2024-01-27 ENCOUNTER — Ambulatory Visit: Admitting: Neurology

## 2024-01-27 LAB — GENECONNECT MOLECULAR SCREEN: Genetic Analysis Overall Interpretation: NEGATIVE

## 2024-02-04 ENCOUNTER — Ambulatory Visit (INDEPENDENT_AMBULATORY_CARE_PROVIDER_SITE_OTHER): Admitting: Family Medicine

## 2024-02-04 ENCOUNTER — Ambulatory Visit (HOSPITAL_COMMUNITY)
Admission: RE | Admit: 2024-02-04 | Discharge: 2024-02-04 | Disposition: A | Source: Ambulatory Visit | Attending: Family Medicine

## 2024-02-04 DIAGNOSIS — N63 Unspecified lump in unspecified breast: Secondary | ICD-10-CM

## 2024-02-19 ENCOUNTER — Telehealth: Admitting: Psychiatry

## 2024-03-16 ENCOUNTER — Ambulatory Visit (INDEPENDENT_AMBULATORY_CARE_PROVIDER_SITE_OTHER): Admitting: Family Medicine

## 2024-03-17 ENCOUNTER — Ambulatory Visit: Admitting: Adult Health
